# Patient Record
Sex: Male | Born: 1950 | Race: Black or African American | Hispanic: No | Marital: Married | Smoking: Former smoker
Health system: Southern US, Community
[De-identification: ages and names within clinical notes are randomized; demographics above are authoritative.]

## PROBLEM LIST (undated history)

## (undated) DIAGNOSIS — E785 Hyperlipidemia, unspecified: Secondary | ICD-10-CM

## (undated) DIAGNOSIS — I639 Cerebral infarction, unspecified: Secondary | ICD-10-CM

## (undated) DIAGNOSIS — I10 Essential (primary) hypertension: Secondary | ICD-10-CM

## (undated) DIAGNOSIS — C61 Malignant neoplasm of prostate: Secondary | ICD-10-CM

## (undated) DIAGNOSIS — I4891 Unspecified atrial fibrillation: Secondary | ICD-10-CM

## (undated) DIAGNOSIS — Z7902 Long term (current) use of antithrombotics/antiplatelets: Secondary | ICD-10-CM

## (undated) DIAGNOSIS — D649 Anemia, unspecified: Secondary | ICD-10-CM

## (undated) DIAGNOSIS — E119 Type 2 diabetes mellitus without complications: Secondary | ICD-10-CM

## (undated) DIAGNOSIS — M0579 Rheumatoid arthritis with rheumatoid factor of multiple sites without organ or systems involvement: Secondary | ICD-10-CM

## (undated) DIAGNOSIS — Z79899 Other long term (current) drug therapy: Secondary | ICD-10-CM

## (undated) DIAGNOSIS — I700 Atherosclerosis of aorta: Secondary | ICD-10-CM

## (undated) DIAGNOSIS — Z7952 Long term (current) use of systemic steroids: Secondary | ICD-10-CM

## (undated) DIAGNOSIS — R7303 Prediabetes: Secondary | ICD-10-CM

## (undated) DIAGNOSIS — D709 Neutropenia, unspecified: Secondary | ICD-10-CM

## (undated) HISTORY — DX: Prediabetes: R73.03

## (undated) HISTORY — DX: Neutropenia, unspecified: D70.9

## (undated) HISTORY — DX: Anemia, unspecified: D64.9

## (undated) HISTORY — DX: Rheumatoid arthritis with rheumatoid factor of multiple sites without organ or systems involvement: M05.79

## (undated) HISTORY — DX: Malignant neoplasm of prostate: C61

## (undated) HISTORY — DX: Other long term (current) drug therapy: Z79.899

## (undated) HISTORY — DX: Essential (primary) hypertension: I10

---

## 2005-01-17 ENCOUNTER — Emergency Department (HOSPITAL_COMMUNITY): Admission: EM | Admit: 2005-01-17 | Discharge: 2005-01-18 | Payer: Self-pay | Admitting: *Deleted

## 2005-01-22 ENCOUNTER — Emergency Department (HOSPITAL_COMMUNITY): Admission: EM | Admit: 2005-01-22 | Discharge: 2005-01-22 | Payer: Self-pay | Admitting: Emergency Medicine

## 2006-02-24 ENCOUNTER — Encounter: Admission: RE | Admit: 2006-02-24 | Discharge: 2006-02-24 | Payer: Self-pay | Admitting: Rheumatology

## 2012-04-23 ENCOUNTER — Emergency Department: Payer: Self-pay | Admitting: Emergency Medicine

## 2014-06-01 ENCOUNTER — Ambulatory Visit: Payer: Self-pay | Admitting: Rheumatology

## 2014-10-08 ENCOUNTER — Telehealth: Payer: Self-pay | Admitting: Hematology

## 2014-10-08 NOTE — Telephone Encounter (Signed)
Lt mess regarding appt 11/02/14 at 10:30am w/Feng Dx: elevated Immunoglobulins Referring Dr. Estanislado Pandy

## 2014-10-09 ENCOUNTER — Telehealth: Payer: Self-pay | Admitting: Hematology

## 2014-10-09 NOTE — Telephone Encounter (Signed)
PT REQUESTED TO GO TO [place] TO BE CLOSE TO HOME

## 2014-11-02 ENCOUNTER — Ambulatory Visit: Payer: Self-pay | Admitting: Hematology

## 2014-11-02 ENCOUNTER — Ambulatory Visit: Payer: Self-pay

## 2015-09-16 ENCOUNTER — Telehealth: Payer: Self-pay | Admitting: Hematology

## 2015-09-16 NOTE — Telephone Encounter (Signed)
New pt referral in referral entry for [facility]. Established pt at [facility]. Closer to home.

## 2016-04-23 LAB — CBC AND DIFFERENTIAL
HEMATOCRIT: 35 % — AB (ref 41–53)
HEMOGLOBIN: 11.5 g/dL — AB (ref 13.5–17.5)
PLATELETS: 238 10*3/uL (ref 150–399)
WBC: 4 10^3/mL

## 2016-04-23 LAB — HEPATIC FUNCTION PANEL
ALK PHOS: 73 U/L (ref 25–125)
ALT: 39 U/L (ref 10–40)
AST: 45 U/L — AB (ref 14–40)
BILIRUBIN, TOTAL: 0.6 mg/dL

## 2016-04-23 LAB — BASIC METABOLIC PANEL
BUN: 24 mg/dL — AB (ref 4–21)
Creatinine: 1.4 mg/dL — AB (ref 0.6–1.3)
Glucose: 89 mg/dL
Potassium: 4 mmol/L (ref 3.4–5.3)
SODIUM: 136 mmol/L — AB (ref 137–147)

## 2016-06-01 ENCOUNTER — Other Ambulatory Visit: Payer: Self-pay | Admitting: Rheumatology

## 2016-06-01 ENCOUNTER — Other Ambulatory Visit (INDEPENDENT_AMBULATORY_CARE_PROVIDER_SITE_OTHER): Payer: Self-pay | Admitting: Rheumatology

## 2016-06-01 DIAGNOSIS — Z9225 Personal history of immunosupression therapy: Secondary | ICD-10-CM

## 2016-06-02 DIAGNOSIS — Z79899 Other long term (current) drug therapy: Secondary | ICD-10-CM

## 2016-06-02 DIAGNOSIS — E559 Vitamin D deficiency, unspecified: Secondary | ICD-10-CM | POA: Insufficient documentation

## 2016-06-02 DIAGNOSIS — M0579 Rheumatoid arthritis with rheumatoid factor of multiple sites without organ or systems involvement: Secondary | ICD-10-CM | POA: Insufficient documentation

## 2016-06-02 DIAGNOSIS — I10 Essential (primary) hypertension: Secondary | ICD-10-CM | POA: Insufficient documentation

## 2016-06-02 HISTORY — DX: Essential (primary) hypertension: I10

## 2016-06-02 HISTORY — DX: Other long term (current) drug therapy: Z79.899

## 2016-06-02 HISTORY — DX: Rheumatoid arthritis with rheumatoid factor of multiple sites without organ or systems involvement: M05.79

## 2016-06-02 NOTE — Progress Notes (Deleted)
*  IMAGE* Office Visit Note  Patient: Michael Hinton.             Date of Birth: 1951-01-29           MRN: [id number]             PCP: No primary care provider on file. Referring: No ref. provider found Visit Date: 06/03/2016    Subjective:  No chief complaint on file.   History of Present Illness: Jackie Khawaja. is a 65 y.o.retired  male,on disability with severe, erosive, sero+RA. He came today to get ultrasound examination of bilateral hands to look for any underlying synovitis.   Activities of Daily Living:  Patient reports morning stiffness for *** {minute/hour:19697}.   Patient {ACTIONS;DENIES/REPORTS:21021675::"Denies"} nocturnal pain.  Difficulty dressing/grooming: {ACTIONS;DENIES/REPORTS:21021675::"Denies"} Difficulty climbing stairs: {ACTIONS;DENIES/REPORTS:21021675::"Denies"} Difficulty getting out of chair: {ACTIONS;DENIES/REPORTS:21021675::"Denies"} Difficulty using hands for taps, buttons, cutlery, and/or writing: {ACTIONS;DENIES/REPORTS:21021675::"Denies"}   No Rheumatology ROS completed.   PMFS History:  Patient Active Problem List   Diagnosis Date Noted  . Rheumatoid arthritis involving multiple sites with positive rheumatoid factor ([place]) 06/02/2016  . High risk medication use 06/02/2016  . Hypertension 06/02/2016  . Vitamin D deficiency 06/02/2016    No past medical history on file.  No family history on file. No past surgical history on file. Social History   Social History Narrative  . No narrative on file     Objective: Vital Signs: There were no vitals taken for this visit.   Physical Exam   Musculoskeletal Exam: ***  CDAI Exam: No CDAI exam completed.    Investigation: Findings:  Labs done on 04/23/2016: Comprehensive metabolic panel showed creatinine 1.35, GFR 56, AST 45, CBC showed hemoglobin 11.5, TB Gold indeterminate, vitamin D 20    Imaging: No results found.  Speciality Comments: No specialty comments  available.    Procedures:  No procedures performed Allergies: Review of patient's allergies indicates not on file.   Assessment / Plan: Visit Diagnoses: Rheumatoid arthritis involving multiple sites with positive rheumatoid factor (HCC) - erosive +RF+CCP   High risk medication use  Flexion contractures - BEJ  Essential hypertension  Vitamin D deficiency   No problem-specific Assessment & Plan notes found for this encounter.   Follow-Up Instructions: No Follow-up on file.  Orders: No orders of the defined types were placed in this encounter.  No orders of the defined types were placed in this encounter.

## 2016-06-03 ENCOUNTER — Inpatient Hospital Stay (INDEPENDENT_AMBULATORY_CARE_PROVIDER_SITE_OTHER): Payer: Self-pay

## 2016-06-03 ENCOUNTER — Telehealth: Payer: Self-pay | Admitting: Pharmacist

## 2016-06-03 ENCOUNTER — Ambulatory Visit (INDEPENDENT_AMBULATORY_CARE_PROVIDER_SITE_OTHER): Payer: Medicare Other | Admitting: Rheumatology

## 2016-06-03 ENCOUNTER — Inpatient Hospital Stay (INDEPENDENT_AMBULATORY_CARE_PROVIDER_SITE_OTHER): Payer: Medicare Other

## 2016-06-03 DIAGNOSIS — Z79899 Other long term (current) drug therapy: Secondary | ICD-10-CM

## 2016-06-03 DIAGNOSIS — I10 Essential (primary) hypertension: Secondary | ICD-10-CM

## 2016-06-03 DIAGNOSIS — M0579 Rheumatoid arthritis with rheumatoid factor of multiple sites without organ or systems involvement: Secondary | ICD-10-CM

## 2016-06-03 DIAGNOSIS — M2450 Contracture, unspecified joint: Secondary | ICD-10-CM

## 2016-06-03 DIAGNOSIS — E559 Vitamin D deficiency, unspecified: Secondary | ICD-10-CM

## 2016-06-03 LAB — QUANTIFERON TB GOLD ASSAY (BLOOD)
Mitogen-Nil: 0.12 IU/mL
QUANTIFERON NIL VALUE: 0.01 [IU]/mL
Quantiferon Tb Ag Minus Nil Value: 0.04 IU/mL

## 2016-06-03 NOTE — Progress Notes (Signed)
   Procedure Note  Patient: Michael Hinton.             Date of Birth: 1951-06-12           MRN: [id number]             Visit Date: 06/03/2016  Procedures: Visit Diagnoses: Rheumatoid arthritis involving multiple sites with positive rheumatoid factor ([place]) - erosive +RF+CCP - Plan: [place] Extrem Up Bilat Comp, [place] Extrem Up Bilat Comp  Mr. Berenice Primas was here to get ultrasound examination of bilateral hands for ongoing discomfort in his hands. Per EULAR recommendations ultrasound examination of bilateral hands was performed.  Using 12 MHz transducer, grayscale, power Doppler ultrasound examination was performed. Bilateral second and third and fifth MCP joints and bilateral wrist joints both dorsal and volar aspects were evaluated to look for synovitis or due to synovitis.  Findings: All MCP joints showed joint space narrowing were erosive changes and bilateral second MCP joint, left fifth MCP joint. Synovial thickening was noted in all joints. Mild synovitis was noted and left ulnar styloid area. Right median nerve was 0.12 cm squares and left median nerve was 0.09 cm squares which were within normal limits.  Impression: These findings were consistent with rheumatoid arthritis erosive disease with no aggressive synovitis or tenosynovitis. Median nerves were within normal limits. He will continue current treatment for right now.  No procedures performed

## 2016-06-03 NOTE — Telephone Encounter (Signed)
Patient was in the office today for an ultrasound today.  As he was leaving the office, he asked for a refill of Enbrel.  Reviewed patient's chart.  Patient gets his Enbrel from the [employer].  Patient had standing labs in September and noted that his last TB Gold was indeterminate at that time.  Patient had repeat TB Gold on 06/01/16 which should result by the end of the week according to Rito Ehrlich lab tech.  Discussed with Dr. Estanislado Pandy, and will plan to refill Enbrel once TB Gold results.  Called patient to update him on this.    Elisabeth Most, Pharm.D., BCPS Clinical Pharmacist Pager: [phone] Phone: [phone] 06/03/2016 5:19 PM

## 2016-06-04 ENCOUNTER — Other Ambulatory Visit: Payer: Self-pay | Admitting: Internal Medicine

## 2016-06-04 ENCOUNTER — Telehealth: Payer: Self-pay | Admitting: Radiology

## 2016-06-04 DIAGNOSIS — Z9225 Personal history of immunosupression therapy: Secondary | ICD-10-CM

## 2016-06-04 NOTE — Telephone Encounter (Signed)
I have mailed him the order for the PPD> I have left message for him

## 2016-06-04 NOTE — Telephone Encounter (Signed)
This is the 2nd indeterm. Test he will need a TB skin test I have called him to advise / sent him the order .  Left message for him to call me back   Will you look at this with me please if you can ?

## 2016-06-04 NOTE — Telephone Encounter (Signed)
Discussed this with EPIC team.  It was advised to send an External Ambulatory Referral to Public Health and in the comments add that TB Skin test is needed.  This will print the referral which can be given to patient.

## 2016-06-04 NOTE — Telephone Encounter (Signed)
-----   Message from Bo Merino, MD sent at 06/03/2016  4:31 PM EDT ----- Repeat TB Gold

## 2016-06-10 NOTE — Progress Notes (Signed)
Have gotten indeterm. Results, PPD ordered as reflex to second indeterm.

## 2016-06-18 ENCOUNTER — Telehealth: Payer: Self-pay | Admitting: Pharmacist

## 2016-06-18 NOTE — Telephone Encounter (Signed)
I called patient today to follow up on whether he had a TB Skin test performed.  [name] reports he did get our letter in the mail about getting a TB Skin test, but he has not done it yet.  Advised patient that he can call his PCP to see if they can do the skin test, or he can have it done at the health department.  Patient voiced understanding and reports he will go get that done ASAP.  Advised patient to have results sent to [place] once he completes the TB Skin test and we can send in Enbrel as soon as we have those results.    Elisabeth Most, Pharm.D., BCPS Clinical Pharmacist Pager: [phone] Phone: [phone] 06/18/2016 12:45 PM

## 2016-06-19 ENCOUNTER — Telehealth: Payer: Self-pay | Admitting: Rheumatology

## 2016-06-19 DIAGNOSIS — Z9225 Personal history of immunosupression therapy: Secondary | ICD-10-CM

## 2016-06-19 DIAGNOSIS — Z111 Encounter for screening for respiratory tuberculosis: Secondary | ICD-10-CM

## 2016-06-19 NOTE — Telephone Encounter (Signed)
Patient's wife Babs Sciara (she is on HIPAA) has questions about lab work.

## 2016-06-19 NOTE — Telephone Encounter (Signed)
Spoke to patients wife/ faxed labs /order for skin test and last office visit to his PCP at [facility]

## 2016-06-22 ENCOUNTER — Telehealth: Payer: Self-pay | Admitting: Rheumatology

## 2016-06-22 NOTE — Telephone Encounter (Signed)
I have called him to advise TB skin test due since he has had 2 indeterm. Tests he will go tomorrow to [facility], they did get the orders.

## 2016-06-22 NOTE — Telephone Encounter (Signed)
Patient would like to speak to Amy about lab work. He would like to know if he needs additional labs done, patient states he had labs done 2 weeks ago.

## 2016-06-22 NOTE — Telephone Encounter (Signed)
I have discussed with his wife, on Friday, he needs TB skin test done. Will call him also to advise.

## 2016-07-09 ENCOUNTER — Telehealth: Payer: Self-pay | Admitting: Pharmacist

## 2016-07-09 NOTE — Telephone Encounter (Signed)
Prescription was signed by Dr. Estanislado Pandy and faxed to [facility].  Will upload prescription into media tab.    Elisabeth Most, Pharm.D., BCPS Clinical Pharmacist Pager: [phone] Phone: [phone] 07/09/2016 3:54 PM

## 2016-07-09 NOTE — Telephone Encounter (Signed)
Ok to refill Enbrel

## 2016-07-09 NOTE — Telephone Encounter (Signed)
Patient requested a refill of Enbrel while in our office on 06/03/16.  We have been waiting for results of his TB skin test since he had two indeterminate TB Gold tests.  Called patient to follow up today as we still have not received his TB skin test.  Patient confirmed he had the TB skin test performed at [facility] and reports the result was normal.  Patient provided me with the clinic's phone number in order to get a copy of the results.    [place] [phone]) who faxed over the results of the TB skin test.  I confirmed that the TB skin test was negative.  Will upload into his chart.   Last visit: 06/03/16 Next visit: 08/25/16 Labs:  CMP: Cr 1.35, GFR 65, AST 45 (04/23/16) CBC: Hgb 11.5, Hct 34.5,  (04/23/16) TB skin test negative (06/26/16)  Okay to refill his Enbrel?

## 2016-08-07 ENCOUNTER — Other Ambulatory Visit: Payer: Self-pay | Admitting: Rheumatology

## 2016-08-07 NOTE — Telephone Encounter (Signed)
Patient needs a refill of Enbrel; states he gets this through the [employer]

## 2016-08-07 NOTE — Telephone Encounter (Signed)
Left message for patient to advise he is due for labs.

## 2016-08-07 NOTE — Telephone Encounter (Signed)
Patient will have lab work faxed over.

## 2016-08-21 ENCOUNTER — Encounter (INDEPENDENT_AMBULATORY_CARE_PROVIDER_SITE_OTHER): Payer: Self-pay

## 2016-08-23 DIAGNOSIS — M24561 Contracture, right knee: Secondary | ICD-10-CM | POA: Insufficient documentation

## 2016-08-23 DIAGNOSIS — M24562 Contracture, left knee: Secondary | ICD-10-CM

## 2016-08-23 DIAGNOSIS — M24529 Contracture, unspecified elbow: Secondary | ICD-10-CM | POA: Insufficient documentation

## 2016-08-23 NOTE — Progress Notes (Signed)
Office Visit Note  Patient: Michael Hinton.             Date of Birth: 01/15/1951           MRN: [id number]             PCP: Delfina Redwood, PA-C Referring: No ref. provider found Visit Date: 08/25/2016 Occupation: @GUAROCC @    Subjective:  Retired   History of Present Illness: Michael Hinton. is a 66 y.o. male with history of sero positive rheumatoid arthritis. According to patient he has not had much joint swelling. The Enbrel has been working well for him.  Activities of Daily Living:  Patient reports morning stiffness for0 minute.   Patient Denies nocturnal pain.  Difficulty dressing/grooming: Denies Difficulty climbing stairs: Denies Difficulty getting out of chair: Denies Difficulty using hands for taps, buttons, cutlery, and/or writing: Denies   Review of Systems  Constitutional: Negative for fatigue, night sweats and weakness ( ).  HENT: Negative for mouth sores, mouth dryness and nose dryness.   Eyes: Negative for redness and dryness.  Respiratory: Negative for shortness of breath and difficulty breathing.   Cardiovascular: Negative for chest pain, palpitations, hypertension, irregular heartbeat and swelling in legs/feet.  Gastrointestinal: Negative for constipation and diarrhea.  Endocrine: Negative for increased urination.  Musculoskeletal: Negative for arthralgias, joint pain, joint swelling, myalgias, muscle weakness, morning stiffness, muscle tenderness and myalgias.  Skin: Negative for color change, rash, hair loss, nodules/bumps, skin tightness, ulcers and sensitivity to sunlight.  Allergic/Immunologic: Negative for susceptible to infections.  Neurological: Negative for dizziness, fainting, memory loss and night sweats.  Hematological: Negative for swollen glands.  Psychiatric/Behavioral: Negative for depressed mood and sleep disturbance. The patient is not nervous/anxious.     PMFS History:  Patient Active Problem List   Diagnosis Date Noted  .  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 Rheumatoid arthritis involving multiple sites with positive rheumatoid factor ([place]) 06/02/2016  . High risk medication use 06/02/2016  . Hypertension 06/02/2016  . Vitamin D deficiency 06/02/2016    Past Medical History:  Diagnosis Date  . High risk medication use 06/02/2016  . Hypertension 06/02/2016  . Rheumatoid arthritis involving multiple sites with positive rheumatoid factor ([place]) 06/02/2016    No family history on file. No past surgical history on file. Social History   Social History Narrative  . No narrative on file     Objective: Vital Hinton: BP 130/70   Pulse 72   Resp 16   Ht 5\' 11"  (1.803 m)   Wt 225 lb (102.1 kg)   BMI 31.38 kg/m    Physical Exam  Constitutional: He is oriented to person, place, and time. He appears well-developed and well-nourished.  HENT:  Head: Normocephalic and atraumatic.  Eyes: Conjunctivae and EOM are normal. Pupils are equal, round, and reactive to light.  Neck: Normal range of motion. Neck supple.  Cardiovascular: Normal rate, regular rhythm and normal heart sounds.   Pulmonary/Chest: Effort normal and breath sounds normal.  Abdominal: Soft. Bowel sounds are normal.  Neurological: He is alert and oriented to person, place, and time.  Skin: Skin is warm and dry. Capillary refill takes less than 2 seconds.  Psychiatric: He has a normal mood and affect. His behavior is normal.  Nursing note and vitals reviewed.    Musculoskeletal Exam: C-spine and thoracic lumbar spine good range of motion. Bilateral shoulder joints good range of motion. Right elbow joint 20  to be contracture in left elbow joint 15 contracture. He has limited range of motion of bilateral wrist joints incomplete fist formation bilaterally. He has thickening of bilateral MCP joints with ulnar deviation. Hip joints are good range of motion, right knee joint limited extension. Ankle joints good  range of motion some MTP thickening but no synovitis.  CDAI Exam: CDAI Homunculus Exam:   Joint Counts:  CDAI Tender Joint count: 0 CDAI Swollen Joint count: 0  Global Assessments:  Patient Global Assessment: 1 Provider Global Assessment: 1  CDAI Calculated Score: 2    Investigation: Findings:  October 2016 TV: Negative, October 2015 chest x-ray normal, hepatitis panel negative 06/08/2016 WBC 3.0 hemoglobin 10.5 platelets 276, CMP normal, PPD 06/26/2016 negative    Imaging: Xr Foot 2 Views Left  Result Date: 08/25/2016 Narrowing of all MTP joints with erosive changes in several of the MTP joints. No PIP/DIP changes were noted there was narrowing of metatarsal tarsal and intertarsal joints with almost fusion of all intertarsal tarsal joint space. A small calcaneal spur was noted. Impression: Severe erosive inflammatory arthritis consistent with rheumatoid arthritis  Xr Foot 2 Views Right  Result Date: 08/25/2016 All MTP joint subluxation narrowing and erosive changes more aggressive than the left foot PIP/DIP narrowing but no erosive changes were noted. There was some metatarsal tarsal joint narrowing intertarsal joints almost fused a small calcaneal spur was noted. Impression: These findings are consistent with severe erosive rheumatoid arthritis.  Xr Hand 2 View Left  Result Date: 08/25/2016 Left first second third fourth MCP severe narrowing with erosive changes. Left fourth MCP ankle pulses. Severe narrowing of left third PIP joint. All intercarpal joints are almost fused. Severe radiocarpal radioulnar joint narrowing with erosive changes. Impression: Severe arthritis with erosive changes consistent with rheumatoid arthritis  Xr Hand 2 View Right  Result Date: 08/25/2016 Right first second third fourth MCP narrowing erosive changes in her right second and first MCP. Narrowing of all PIP joints. 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    Procedures:  No procedures performed Allergies: Methotrexate derivatives   Assessment / Plan:     Visit Diagnoses: Rheumatoid arthritis involving multiple sites with positive rheumatoid factor (HCC) - Positive RF, positive CCP, severe erosive disease with multiple contractures. He has no synovitis on examination he appears to be tolerating Enbrel well . He is very pleased with the results.  High risk medication use - Enbrel 50 mg subcutaneously every 2 weeks. His labs from October 2017 showed mild neutropenia and anemia otherwise unremarkable. His PPD: 06/26/2016 was also negative. I will obtain labs today and then every 3 months to monitor for drug toxicity. We will be applying for his Enbrel assistance program today and was given 2 samples of Enbrel.  Contracture of elbow - Bilateral: Unchanged with no synovitis  Contractures of both knees: He has some limited extension which causes limping. I'll obtain x-rays today to evaluate this further.  Essential hypertension    Orders: Orders Placed This Encounter  Procedures  . XR  Hand 2 View Right  . XR Hand 2 View Left  . XR KNEE 3 VIEW RIGHT  . XR Foot 2 Views Right  . XR Foot 2 Views Left  . CBC with Differential/Platelet  . COMPLETE METABOLIC PANEL WITH GFR  . CBC with Differential/Platelet  . COMPLETE METABOLIC PANEL WITH GFR   Meds ordered this encounter    Medications  . etanercept (ENBREL SURECLICK) 50 MG/ML injection    Sig: Inject 0.98 mLs (50 mg total) into the skin once a week.    Dispense:  1.96 mL    Refill:  0    Face-to-face time spent with patient was 30 minutes. 50% of time was spent in counseling and coordination of care.  Follow-Up Instructions: Return in about 5 months (around 01/23/2017) for Rheumatoid arthritis.   Bo Merino, MD  Note - This record has been created using Editor, commissioning.  Chart creation errors have been sought, but may not always  have been located. Such creation errors do not reflect on  the standard of medical care.

## 2016-08-25 ENCOUNTER — Ambulatory Visit (INDEPENDENT_AMBULATORY_CARE_PROVIDER_SITE_OTHER): Payer: Medicare Other

## 2016-08-25 ENCOUNTER — Ambulatory Visit (INDEPENDENT_AMBULATORY_CARE_PROVIDER_SITE_OTHER): Payer: Self-pay

## 2016-08-25 ENCOUNTER — Encounter: Payer: Self-pay | Admitting: Rheumatology

## 2016-08-25 ENCOUNTER — Ambulatory Visit (INDEPENDENT_AMBULATORY_CARE_PROVIDER_SITE_OTHER): Payer: Medicare Other | Admitting: Rheumatology

## 2016-08-25 VITALS — BP 130/70 | HR 72 | Resp 16 | Ht 71.0 in | Wt 225.0 lb

## 2016-08-25 DIAGNOSIS — M0579 Rheumatoid arthritis with rheumatoid factor of multiple sites without organ or systems involvement: Secondary | ICD-10-CM

## 2016-08-25 DIAGNOSIS — M24562 Contracture, left knee: Secondary | ICD-10-CM | POA: Diagnosis not present

## 2016-08-25 DIAGNOSIS — M24561 Contracture, right knee: Secondary | ICD-10-CM | POA: Diagnosis not present

## 2016-08-25 DIAGNOSIS — M24529 Contracture, unspecified elbow: Secondary | ICD-10-CM

## 2016-08-25 DIAGNOSIS — Z79899 Other long term (current) drug therapy: Secondary | ICD-10-CM

## 2016-08-25 DIAGNOSIS — I10 Essential (primary) hypertension: Secondary | ICD-10-CM

## 2016-08-25 LAB — CBC WITH DIFFERENTIAL/PLATELET
Basophils Absolute: 33 cells/uL (ref 0–200)
Basophils Relative: 1 %
EOS PCT: 6 %
Eosinophils Absolute: 198 cells/uL (ref 15–500)
HCT: 38 % — ABNORMAL LOW (ref 38.5–50.0)
Hemoglobin: 12.2 g/dL — ABNORMAL LOW (ref 13.2–17.1)
LYMPHS PCT: 43 %
Lymphs Abs: 1419 cells/uL (ref 850–3900)
MCH: 26 pg — AB (ref 27.0–33.0)
MCHC: 32.1 g/dL (ref 32.0–36.0)
MCV: 80.9 fL (ref 80.0–100.0)
MPV: 10.1 fL (ref 7.5–12.5)
Monocytes Absolute: 429 cells/uL (ref 200–950)
Monocytes Relative: 13 %
NEUTROS PCT: 37 %
Neutro Abs: 1221 cells/uL — ABNORMAL LOW (ref 1500–7800)
Platelets: 182 10*3/uL (ref 140–400)
RBC: 4.7 MIL/uL (ref 4.20–5.80)
RDW: 16.1 % — AB (ref 11.0–15.0)
WBC: 3.3 10*3/uL — AB (ref 3.8–10.8)

## 2016-08-25 LAB — COMPLETE METABOLIC PANEL WITH GFR
ALT: 18 U/L (ref 9–46)
AST: 24 U/L (ref 10–35)
Albumin: 3.7 g/dL (ref 3.6–5.1)
Alkaline Phosphatase: 75 U/L (ref 40–115)
BUN: 19 mg/dL (ref 7–25)
CALCIUM: 9.2 mg/dL (ref 8.6–10.3)
CHLORIDE: 107 mmol/L (ref 98–110)
CO2: 24 mmol/L (ref 20–31)
Creat: 1.05 mg/dL (ref 0.70–1.25)
GFR, EST NON AFRICAN AMERICAN: 75 mL/min (ref >=60)
GFR, Est African American: 87 mL/min (ref >=60)
Glucose, Bld: 109 mg/dL — ABNORMAL HIGH (ref 65–99)
POTASSIUM: 4.2 mmol/L (ref 3.5–5.3)
SODIUM: 138 mmol/L (ref 135–146)
Total Bilirubin: 0.6 mg/dL (ref 0.2–1.2)
Total Protein: 7.5 g/dL (ref 6.1–8.1)

## 2016-08-25 MED ORDER — ETANERCEPT 50 MG/ML ~~LOC~~ SOAJ: 50.0000 mg | SUBCUTANEOUS | 0 refills | Status: DC

## 2016-08-25 NOTE — Progress Notes (Signed)
Rheumatology Medication Review by a Pharmacist Does the patient feel that his/her medications are working for him/her?  Yes Has the patient been experiencing any side effects to the medications prescribed?  No Does the patient have any problems obtaining medications?  Yes - patient is out of Enbrel and has not been re-enrolled in the patient assistance program.    Issues to address at subsequent visits: Enrollment in [place] Patient Assistance Program   Pharmacist comments:  Michael Hinton is a 66 yo M who presents for follow up of his rheumatoid arthritis.  He is currently taking Enbrel 50 mg every other week.  Patient had most recent standing labs on 06/08/16 at which time CMP was normal, CBC showed WBC of 3, Hgb 10.9, Hct 33.6.  Patient will be due for standing labs again this month.  His most recent TB test was a PPD on 06/26/16 which was negative.  He will be due for TB test again in November 2018.    Patient brought application for the [employer] patient assistance program for Enbrel.  Provider portion was signed by Dr. Estanislado Pandy and form was faxed.  Patient reports he just used his last Enbrel pen and is now out of his medication.  Discussed with Dr. Estanislado Pandy.  Okay to provide patient with samples while waiting on his application to process.  Patient was provided with 2 Enbrel pens.  Will follow up on status of patient's application in 1 week.  Patient denies any further questions or concerns regarding his medications at this time.     Elisabeth Most, Pharm.D., BCPS, CPP Clinical Pharmacist Pager: [phone] Phone: [phone] 08/25/2016 9:28 AM

## 2016-08-25 NOTE — Patient Instructions (Signed)
Standing Labs We placed an order today for your standing lab work.    Please come back and get your standing labs in April and every 3 months  We have open lab Monday through Friday from 8:30-11:30 AM and 1:30-4 PM at the office of Dr. Brayln Duque/Naitik Panwala, PA.   The office is located at [address], [address], [place], [place] [address] No appointment is necessary.   Labs are drawn by [facility].  You may receive a bill from [facility] for your lab work.    

## 2016-08-27 NOTE — Progress Notes (Signed)
WBC low. Repeat CBC in 1 month

## 2016-08-28 NOTE — Progress Notes (Signed)
We can recheck in 1 month.

## 2016-08-31 ENCOUNTER — Telehealth: Payer: Self-pay | Admitting: Rheumatology

## 2016-08-31 ENCOUNTER — Telehealth: Payer: Self-pay | Admitting: Pharmacist

## 2016-08-31 NOTE — Telephone Encounter (Signed)
Patient called to speak to Dr. Koleen Nimrod. Please call patient.

## 2016-08-31 NOTE — Telephone Encounter (Signed)
Received fax from [employer] stating patient was denied because patient's insurance plan indicated that they have insurance coverage for the medication requested.  Reviewed patient's chart and noted that he does not have prescription drug insurance.  He only has Medicare A & B.  I called patient to discuss.  Patient confirmed he only has original medicare and does not have prescription insurance.  I informed patient we will call [employer] to discuss and will update him once we know more.    While on the phone patient asked me to send his most recent labs to his primary care provider.  Labs from 08/25/16 were faxed to patient's PCP.    [place]asta, will you call [employer] to see why patient did not qualify?  Thank you.

## 2016-08-31 NOTE — Telephone Encounter (Addendum)
Spoke with Aaron Edelman, a representative from [employer] regarding the denial application for Michael Hinton. He states that the patient was approved last year as a once per lifetime pending PartD coverage. According to him, the patient should have applied for partD coverage for 2018 and he would have still been able to use the foundation for support. If he decides to enroll in a partD plane for 2019, he should also re-apply to [place]. He suggested calling Enrel support and/or Amgen Assist for further assistance for 2018.  Enbrel Support: [place]: [phone]  Spoke with a representative from both organizations.  Shley from Enbrel support states that they were not able to assist patient because he did not have RX insurance.   Enid Derry from [employer] that they only handle claims dealing with oncology packages.

## 2016-08-31 NOTE — Telephone Encounter (Signed)
Called patient.  He said that the Enbrel Medicare Solutions Team was unable to provide any assistance since he does not have medicare part D.    Dr. Estanislado Pandy, patient is unable to get assistance for Enbrel this year until he signs up for medicare part D prescription insurance.  He is not eligible for sign up for prescription insurance until October of 2018.  He is currently taking Enbrel every other week.  He has enough medication for this month and part of next month.  Please advise, do you want patient to schedule visit to discuss therapy?  We can try to provide him with samples, but we may not be able to guarantee samples to last him until he can get insurance.

## 2016-08-31 NOTE — Telephone Encounter (Signed)
I received a call from patient who reports he spoke to the local [place] Program who told him he could not enroll in [place] part D insurance until October.  I advised patient to reach out to the Enbrel Medicare Solutions Team to see if they had any further advise.  I provided him with their phone number [phone], option 6).  Patient reports he will try to call them and will let us know what he finds out.   Elisabeth Most, Pharm.D., BCPS, CPP Clinical Pharmacist Pager: [phone] Phone: [phone] 08/31/2016 12:24 PM

## 2016-08-31 NOTE — Telephone Encounter (Signed)
Called Mr. Berenice Primas to update him.  Advised him that to be considered for [employer] patient assistance program, he will need to apply for medicare part D insurance.  I provided patient with information on the [place] program [facility]).  Gave him information on the local [facility] office at [facility] [phone] [address] [place] [address], [phone]).  Advised him to call this office to find out more about enrolling in a part D plan.  Informed him we can reapply for [employer] Safety Net once he has applied for part D insurance.  Asked patient to call me once he knows more information about applying for medicare part D.  Patient voiced understanding.  He confirms he has his medication at this time.     Elisabeth Most, Pharm.D., BCPS, CPP Clinical Pharmacist Pager: [phone] Phone: [phone] 08/31/2016 11:17 AM

## 2016-08-31 NOTE — Telephone Encounter (Signed)
Ok to give him samples till the fu visit.

## 2016-09-01 ENCOUNTER — Encounter: Payer: Self-pay | Admitting: Rheumatology

## 2016-09-01 NOTE — Progress Notes (Signed)
   I review document written by Michael Hinton, Hinton of Michael Hinton,  Michael Hinton, [place]. The date of birth for the Michael Hinton that we have in his chart is 1951-05-08. Michael Hinton were done 07/18/1948.  I was able to reconcile that this paperwork with the wrong date of birth is consistent with this particular Michael Hinton with the date of birth of 11-21-1950.   The letter is dated 06/03/2016 Michael Hinton raised the question of a rash Michael Hinton was having and wondered off it was from arthritis. The Michael Hinton actually was seen on 06/03/2016 and an ultrasound was done on that date for bilateral hands. The findings of the ultrasound included erosive changes from RA.  The second point [place] raise on the letter was that he was having swelling on the left ankle and she described it as "pretty bad". Michael Hinton was recently seen on 08/25/2016 by Dr. Estanislado Pandy. On that visit it was noted that Michael swelling to his joints were much improved and he was happy with the way Enbrel was working for him. Unfortunately he is not able to get Enbrel through the Michael Hinton assistance program yet and we are working with Enbrel Michael Hinton assistance program so that he can get Enbrel through them. Until that is approved, we are trying to give him samples. Michael Hinton understands and is agreeable.  On 0000000 letter Solmon Ice noted that Michael urine was dark on Monday and it was looking orange and maybe there is protein in the urine. She wanted to know if he needed a nephrology referral since his creatinine was high on his recent labs. Michael creatinine has improved and his most recent labs. He is GFR is also improved to 87. Results for DEMONTEZ, DEROUSSE (MRN [id number]) as of 09/01/2016 16:15  Ref. Range 04/23/2016 00:00 08/25/2016 08:33  Creatinine Latest Ref Range: 0.70 - 1.25 mg/dL 1.4 (A) 1.05  With these 2 labs doing better, he does not need a nephrology referral at this time.  He has follow-up appointment on  01/26/2017. This is fine based on his most recent visit to our office.  No further intervention needs to be done for this Michael Hinton based on the letter dated 0000000 written by Michael Hinton. Reviewing his chart shows that all of these issues have been addressed in one way or another through physical exam, lab work, making Enbrel available for the Michael Hinton, etc.  Mr. Carlyon Shadow, [place]

## 2016-09-01 NOTE — Telephone Encounter (Signed)
Called patient and advised that Dr. Estanislado Pandy has agreed to try to provide him with samples until his follow up if we are able.  Patient voiced understanding and reports he has two Enbrel pens at this time and does not need a sample right now.  Patient will call us when he is on his last pen and request a sample.   Patient asked about his lab work.  I reviewed with him that his WBC was low on 08/25/16 and Dr. Estanislado Pandy wants him to repeat his CBC in one month.  Patient voiced understanding and reports he will come for repeat CBC in mid-February.     Elisabeth Most, Pharm.D., BCPS, CPP Clinical Pharmacist Pager: [phone] Phone: [phone] 09/01/2016 3:36 PM

## 2016-09-25 ENCOUNTER — Other Ambulatory Visit: Payer: Self-pay | Admitting: *Deleted

## 2016-09-25 DIAGNOSIS — Z79899 Other long term (current) drug therapy: Secondary | ICD-10-CM

## 2016-09-25 LAB — COMPLETE METABOLIC PANEL WITH GFR
ALBUMIN: 3.6 g/dL (ref 3.6–5.1)
ALK PHOS: 69 U/L (ref 40–115)
ALT: 17 U/L (ref 9–46)
AST: 22 U/L (ref 10–35)
BILIRUBIN TOTAL: 0.6 mg/dL (ref 0.2–1.2)
BUN: 14 mg/dL (ref 7–25)
CALCIUM: 9.3 mg/dL (ref 8.6–10.3)
CHLORIDE: 108 mmol/L (ref 98–110)
CO2: 22 mmol/L (ref 20–31)
CREATININE: 1.12 mg/dL (ref 0.70–1.25)
GFR, EST AFRICAN AMERICAN: 80 mL/min (ref >=60)
GFR, Est Non African American: 70 mL/min (ref >=60)
Glucose, Bld: 108 mg/dL — ABNORMAL HIGH (ref 65–99)
Potassium: 4 mmol/L (ref 3.5–5.3)
Sodium: 140 mmol/L (ref 135–146)
Total Protein: 7.6 g/dL (ref 6.1–8.1)

## 2016-09-25 LAB — CBC WITH DIFFERENTIAL/PLATELET
BASOS PCT: 0 %
Basophils Absolute: 0 cells/uL (ref 0–200)
EOS ABS: 132 {cells}/uL (ref 15–500)
Eosinophils Relative: 4 %
HEMATOCRIT: 37.7 % — AB (ref 38.5–50.0)
HEMOGLOBIN: 12.1 g/dL — AB (ref 13.2–17.1)
LYMPHS ABS: 1287 {cells}/uL (ref 850–3900)
LYMPHS PCT: 39 %
MCH: 26.1 pg — ABNORMAL LOW (ref 27.0–33.0)
MCHC: 32.1 g/dL (ref 32.0–36.0)
MCV: 81.3 fL (ref 80.0–100.0)
MONO ABS: 297 {cells}/uL (ref 200–950)
MPV: 9.6 fL (ref 7.5–12.5)
Monocytes Relative: 9 %
NEUTROS PCT: 48 %
Neutro Abs: 1584 cells/uL (ref 1500–7800)
Platelets: 178 10*3/uL (ref 140–400)
RBC: 4.64 MIL/uL (ref 4.20–5.80)
RDW: 15.8 % — AB (ref 11.0–15.0)
WBC: 3.3 10*3/uL — ABNORMAL LOW (ref 3.8–10.8)

## 2016-09-28 ENCOUNTER — Telehealth: Payer: Self-pay | Admitting: Rheumatology

## 2016-09-28 NOTE — Telephone Encounter (Signed)
Patient has questions about enbrel samples he is supposed to receive from the office. Please call patient.

## 2016-09-28 NOTE — Telephone Encounter (Signed)
Patient states he was in the for his lab work on 09/25/16 and forgot to mention needing a sample on Enbrel. Patient would like to know if he can come get a sample of Enbrel .   Last Visit: 08/25/16 Next Visit: 01/26/17 Labs: 09/25/16 Stable

## 2016-09-28 NOTE — Telephone Encounter (Signed)
Left message to advise patient he could come by the office to pick up samples of Enbrel.

## 2016-09-28 NOTE — Telephone Encounter (Signed)
Patient advised he may come to the office and pick up samples.

## 2016-09-28 NOTE — Telephone Encounter (Signed)
Okay to give sample of Enbrel

## 2016-09-28 NOTE — Telephone Encounter (Signed)
Patient called to return someone's call in regards to his Enbrel.  [phone].  Thank you.

## 2016-09-29 ENCOUNTER — Telehealth: Payer: Self-pay | Admitting: Rheumatology

## 2016-09-29 NOTE — Telephone Encounter (Signed)
Medication Samples have been provided to the patient.  Drug name:  Enbrel      Strength: 50 mg       Qty:2 [id number]  Exp.Date: 08/2018  Dosing instructions: Inject 1 pen every other week  The patient has been instructed regarding the correct time, dose, and frequency of taking this medication, including desired effects and most common side effects.   Michael Hinton 9:39 AM 09/29/2016

## 2016-09-29 NOTE — Telephone Encounter (Signed)
Patient is requesting lab work be sent to [facility]. Thanks.

## 2016-09-29 NOTE — Telephone Encounter (Signed)
Results sent to [facility] as per patient request.

## 2016-10-29 ENCOUNTER — Telehealth: Payer: Self-pay | Admitting: Rheumatology

## 2016-10-29 NOTE — Telephone Encounter (Signed)
ok 

## 2016-10-29 NOTE — Telephone Encounter (Signed)
Patient advised he may come by the office to pick up Enbrel samples.

## 2016-10-29 NOTE — Telephone Encounter (Addendum)
Last Visit: 08/25/16 Next Visit: 01/26/17 Labs: 09/25/16 Stable PPD on 06/26/16 which was negative  Okay to give samples of Enbrel?

## 2016-10-29 NOTE — Telephone Encounter (Signed)
Patient was getting Rx with [employer] but can longer give past 1 year.  Will be signed up for Medicare part D in October.  Is asking if we can provide samples. Please call as patient has none.

## 2016-11-02 ENCOUNTER — Telehealth: Payer: Self-pay | Admitting: *Deleted

## 2016-11-02 NOTE — Telephone Encounter (Signed)
Medication Samples have been provided to the patient.  Drug name: Enbrel       Strength: 50 mg        Qty: 2  LOT: [id number]  Exp.Date: 09/2018  Dosing instructions: Inject 1 pen every other week   The patient has been instructed regarding the correct time, dose, and frequency of taking this medication, including desired effects and most common side effects.   Gwenlyn Perking 1:51 PM 11/02/2016

## 2016-12-18 ENCOUNTER — Telehealth: Payer: Self-pay | Admitting: Rheumatology

## 2016-12-18 NOTE — Telephone Encounter (Signed)
OK TO GIVE SAMPLES OF ENBREL

## 2016-12-18 NOTE — Telephone Encounter (Signed)
Last Visit: 08/25/16 Next Visit: 01/26/17 Labs: 09/25/16 Stable PPD on 06/26/16 which was negative  Okay to give samples of Enbrel?

## 2016-12-18 NOTE — Telephone Encounter (Signed)
Patient advised okay to come by and get sample from the office. Patient states he will send his wife to come get them on Monday.  Patient reminded he will be due for labs this month.

## 2016-12-18 NOTE — Telephone Encounter (Signed)
Patient out of Michael Hinton, needs rf. Patient will have wife pick it up, [name]. Please call patient when ready.

## 2016-12-21 ENCOUNTER — Telehealth: Payer: Self-pay | Admitting: *Deleted

## 2016-12-21 NOTE — Telephone Encounter (Signed)
Medication Samples have been provided to the patient.  Drug name: Enbrel       Strength: 50 mg/mL      Qty: 2  LOT: [id number]  Exp.Date: 11/2018  Dosing instructions: Inject one pen every other week  The patient has been instructed regarding the correct time, dose, and frequency of taking this medication, including desired effects and most common side effects.   Gwenlyn Perking 12:21 PM 12/21/2016

## 2016-12-24 ENCOUNTER — Other Ambulatory Visit: Payer: Self-pay

## 2016-12-24 DIAGNOSIS — Z79899 Other long term (current) drug therapy: Secondary | ICD-10-CM

## 2016-12-24 LAB — CBC WITH DIFFERENTIAL/PLATELET
Basophils Absolute: 35 cells/uL (ref 0–200)
Basophils Relative: 1 %
EOS ABS: 210 {cells}/uL (ref 15–500)
Eosinophils Relative: 6 %
HEMATOCRIT: 36.6 % — AB (ref 38.5–50.0)
Hemoglobin: 11.8 g/dL — ABNORMAL LOW (ref 13.2–17.1)
LYMPHS PCT: 37 %
Lymphs Abs: 1295 cells/uL (ref 850–3900)
MCH: 26.6 pg — ABNORMAL LOW (ref 27.0–33.0)
MCHC: 32.2 g/dL (ref 32.0–36.0)
MCV: 82.4 fL (ref 80.0–100.0)
MONO ABS: 350 {cells}/uL (ref 200–950)
MPV: 9.4 fL (ref 7.5–12.5)
Monocytes Relative: 10 %
NEUTROS PCT: 46 %
Neutro Abs: 1610 cells/uL (ref 1500–7800)
Platelets: 191 10*3/uL (ref 140–400)
RBC: 4.44 MIL/uL (ref 4.20–5.80)
RDW: 15.4 % — AB (ref 11.0–15.0)
WBC: 3.5 10*3/uL — AB (ref 3.8–10.8)

## 2016-12-25 LAB — COMPLETE METABOLIC PANEL WITH GFR
ALK PHOS: 82 U/L (ref 40–115)
ALT: 19 U/L (ref 9–46)
AST: 23 U/L (ref 10–35)
Albumin: 3.5 g/dL — ABNORMAL LOW (ref 3.6–5.1)
BUN: 19 mg/dL (ref 7–25)
CALCIUM: 9.1 mg/dL (ref 8.6–10.3)
CHLORIDE: 108 mmol/L (ref 98–110)
CO2: 22 mmol/L (ref 20–31)
Creat: 1.16 mg/dL (ref 0.70–1.25)
GFR, EST AFRICAN AMERICAN: 77 mL/min (ref >=60)
GFR, Est Non African American: 67 mL/min (ref >=60)
Glucose, Bld: 114 mg/dL — ABNORMAL HIGH (ref 65–99)
POTASSIUM: 4.3 mmol/L (ref 3.5–5.3)
Sodium: 140 mmol/L (ref 135–146)
Total Bilirubin: 0.5 mg/dL (ref 0.2–1.2)
Total Protein: 7.1 g/dL (ref 6.1–8.1)

## 2017-01-22 DIAGNOSIS — D72819 Decreased white blood cell count, unspecified: Secondary | ICD-10-CM | POA: Insufficient documentation

## 2017-01-22 DIAGNOSIS — R768 Other specified abnormal immunological findings in serum: Secondary | ICD-10-CM | POA: Insufficient documentation

## 2017-01-22 DIAGNOSIS — R7689 Other specified abnormal immunological findings in serum: Secondary | ICD-10-CM | POA: Insufficient documentation

## 2017-01-22 NOTE — Progress Notes (Signed)
Office Visit Note  Patient: Michael Hinton.             Date of Birth: 1951/02/20           MRN: [id number]             PCP: Cristy Folks, PA-C Referring: [place]on Folk* Visit Date: 01/26/2017 Occupation: @GUAROCC @    Subjective:  Joint stiffness.   History of Present Illness: Michael Hinton. is a 66 y.o. male with history of sero positive erosive rheumatoid arthritis. He states she's been doing quite well on Enbrel. He's been off methotrexate for a while. He denies any joint pain or joint swelling. She's been tolerating injections well.   Activities of Daily Living:  Patient reports morning stiffness for 0 minute.   Patient Denies nocturnal pain.  Difficulty dressing/grooming: Denies Difficulty climbing stairs: Denies Difficulty getting out of chair: Denies Difficulty using hands for taps, buttons, cutlery, and/or writing: Denies   Review of Systems  Constitutional: Negative for fatigue, night sweats and weakness ( ).  HENT: Negative for mouth sores, mouth dryness and nose dryness.   Eyes: Negative for redness and dryness.  Respiratory: Negative for shortness of breath and difficulty breathing.   Cardiovascular: Negative for chest pain, palpitations, hypertension, irregular heartbeat and swelling in legs/feet.  Gastrointestinal: Negative for constipation and diarrhea.  Endocrine: Negative for increased urination.  Musculoskeletal: Negative for arthralgias, joint pain, joint swelling, myalgias, muscle weakness, morning stiffness, muscle tenderness and myalgias.  Skin: Negative for color change, rash, hair loss, nodules/bumps, skin tightness, ulcers and sensitivity to sunlight.  Allergic/Immunologic: Negative for susceptible to infections.  Neurological: Negative for dizziness, fainting, memory loss and night sweats.  Hematological: Negative for swollen glands.  Psychiatric/Behavioral: Negative for depressed mood and sleep disturbance. The  patient is not nervous/anxious.     PMFS History:  Patient Active Problem List   Diagnosis Date Noted  . ANA positive 01/22/2017  . 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 Rheumatoid arthritis involving multiple sites with positive rheumatoid factor ([place]) 06/02/2016  . High risk medication use 06/02/2016  . Hypertension 06/02/2016  . Vitamin D deficiency 06/02/2016    Past Medical History:  Diagnosis Date  . High risk medication use 06/02/2016  . Hypertension 06/02/2016  . Rheumatoid arthritis involving multiple sites with positive rheumatoid factor ([place]) 06/02/2016    No family history on file. History reviewed. No pertinent surgical history. Social History   Social History Narrative  . No narrative on file     Objective: Vital Signs: BP (!) 142/70   Pulse 80   Resp 14    Physical Exam  Constitutional: He is oriented to person, place, and time. He appears well-developed and well-nourished.  HENT:  Head: Normocephalic and atraumatic.  Eyes: Conjunctivae and EOM are normal. Pupils are equal, round, and reactive to light.  Neck: Normal range of motion. Neck supple.  Cardiovascular: Normal rate and regular rhythm.   Murmur heard. Pulmonary/Chest: Effort normal and breath sounds normal.  Abdominal: Soft. Bowel sounds are normal.  Neurological: He is alert and oriented to person, place, and time.  Skin: Skin is warm and dry. Capillary refill takes less than 2 seconds.  Psychiatric: He has a normal mood and affect. His behavior is normal.  Nursing note and vitals reviewed.    Musculoskeletal Exam: C-spine and thoracic lumbar spine good range of motion. His bilateral shoulder joint  abduction was limited to 120. His contracture in his bilateral elbows. He is very limited range of motion his bilateral wrist joints with no synovitis. He had ulnar deviation bilaterally with fairly good fist formation with decreased  grip strength. No synovitis was noted over MCPs PIPs. Hip joints and knee joints have limited range of motion with no synovitis.  CDAI Exam: CDAI Homunculus Exam:   Joint Counts:  CDAI Tender Joint count: 0 CDAI Swollen Joint count: 0  Global Assessments:  Patient Global Assessment: 1 Provider Global Assessment: 1  CDAI Calculated Score: 2    Investigation: Findings:    Labs from 06/07/2014 show TB Gold is negative.  It was initially indeterminate on the October 23rd labs.   Labs from 06/01/2014  CMP with GFR is normal.  CBC with diff is normal except for mild anemia.  Sed rate elevated at 82.  Hep panel negative.  Uric acid normal at 6.7.  HIV negative.  IgG and IgM are normal.  IgA is elevated at 1350.  SPEP M-spike negative.  Urine is negative.  IFE is negative.  TB Gold is indeterminate.  CCP is positive at 10.5.  Rheumatoid factor positive at 278.  ANA positive with a titer of 1:80 and homogenous pattern.   Chest x-ray done on 06/01/2014 is negative.  February 2016:  His WBC count was 3.8, hemoglobin 11, platelets 292.  Comprehensive metabolic panel showed albumin of 3.2.  Sedimentation rate was 111. Immunoglobulins IgA and IgG were elevated.    Labs from 11/05/2014 show CBC with diff is normal.  CMP is normal except for increased glucose at 117 (nonfasting levels).  Lipid levels are normal.  Vitamin B12 is low at 158 and being addressed by the PCP.  Increased ferritin at 972.  Random urine shows increased microalbumin and increased albumin/creatinine ration, addressed by PCP.    06/2015:  His white cell count was 3.1, hemoglobin 12.8, comprehensive metabolic panel showed glucose of 125.  In October his white cell count was 2.8.  TB Gold was negative.    Labs from September 16, 2015, show WBCs low at 3.7.  Vitamin B12 is 521.  I discussed with the wife that the patient is not getting his labs at the right time and it is important to get the labs every 2 months.  Otherwise, we will  be unable to see the patient for his care.   06/26/2016 negative PPD from PCP office      CBC Latest Ref Rng & Units 12/24/2016 09/25/2016 08/25/2016  WBC 3.8 - 10.8 K/uL 3.5(L) 3.3(L) 3.3(L)  Hemoglobin 13.2 - 17.1 g/dL 11.8(L) 12.1(L) 12.2(L)  Hematocrit 38.5 - 50.0 % 36.6(L) 37.7(L) 38.0(L)  Platelets 140 - 400 K/uL 191 178 182    CMP Latest Ref Rng & Units 12/24/2016 09/25/2016 08/25/2016  Glucose 65 - 99 mg/dL 114(H) 108(H) 109(H)  BUN 7 - 25 mg/dL 19 14 19   Creatinine 0.70 - 1.25 mg/dL 1.16 1.12 1.05  Sodium 135 - 146 mmol/L 140 140 138  Potassium 3.5 - 5.3 mmol/L 4.3 4.0 4.2  Chloride 98 - 110 mmol/L 108 108 107  CO2 20 - 31 mmol/L 22 22 24   Calcium 8.6 - 10.3 mg/dL 9.1 9.3 9.2  Total Protein 6.1 - 8.1 g/dL 7.1 7.6 7.5  Total Bilirubin 0.2 - 1.2 mg/dL 0.5 0.6 0.6  Alkaline Phos 40 - 115 U/L 82 69 75  AST 10 - 35 U/L 23 22 24   ALT 9 - 46 U/L 19  17 18    Imaging: No results found.  Speciality Comments: No specialty comments available.    Procedures:  No procedures performed Allergies: Patient has no known allergies.   Assessment / Plan:     Visit Diagnoses: Rheumatoid arthritis involving multiple sites with positive rheumatoid factor (HCC) - severe erosive disease RF 278, +anti CCP, +ANA1:80 erosive changes hands feet ,h/o knee effusions, right elbow contracture, limited range of motion in his shoulder joints, elbow joints, wrist joints, ulnar deviation, hip joints and knee joints.  High risk medication use - Enbrel 50 mg sq q other week. He is having some insurance issues and we gave him samples of Enbrel today. His labs are stable except for mild neutropenia which is stable. He is doing very well on current regimen will continue the treatment.  Contracture of right elbow  Contractures of both knees  Vitamin D deficiency: He is on supplement.  History of hypertension: His blood pressure was elevated. I've advised him to monitor his blood pressure and follow-up with  PCP.  Low vitamin B12 level    Orders: No orders of the defined types were placed in this encounter.  No orders of the defined types were placed in this encounter.   Face-to-face time spent with patient was 30 minutes. 50% of time was spent in counseling and coordination of care.  Follow-Up Instructions: Return in about 5 months (around 06/28/2017) for Rheumatoid arthritis.   Bo Merino, MD  Note - This record has been created using Editor, commissioning.  Chart creation errors have been sought, but may not always  have been located. Such creation errors do not reflect on  the standard of medical care.

## 2017-01-26 ENCOUNTER — Ambulatory Visit (INDEPENDENT_AMBULATORY_CARE_PROVIDER_SITE_OTHER): Payer: Medicare Other | Admitting: Rheumatology

## 2017-01-26 ENCOUNTER — Encounter: Payer: Self-pay | Admitting: Rheumatology

## 2017-01-26 VITALS — BP 142/70 | HR 80 | Resp 14

## 2017-01-26 DIAGNOSIS — Z79899 Other long term (current) drug therapy: Secondary | ICD-10-CM | POA: Diagnosis not present

## 2017-01-26 DIAGNOSIS — M24521 Contracture, right elbow: Secondary | ICD-10-CM

## 2017-01-26 DIAGNOSIS — E538 Deficiency of other specified B group vitamins: Secondary | ICD-10-CM

## 2017-01-26 DIAGNOSIS — M24562 Contracture, left knee: Secondary | ICD-10-CM

## 2017-01-26 DIAGNOSIS — M0579 Rheumatoid arthritis with rheumatoid factor of multiple sites without organ or systems involvement: Secondary | ICD-10-CM | POA: Diagnosis not present

## 2017-01-26 DIAGNOSIS — D702 Other drug-induced agranulocytosis: Secondary | ICD-10-CM | POA: Diagnosis not present

## 2017-01-26 DIAGNOSIS — E559 Vitamin D deficiency, unspecified: Secondary | ICD-10-CM

## 2017-01-26 DIAGNOSIS — Z8679 Personal history of other diseases of the circulatory system: Secondary | ICD-10-CM

## 2017-01-26 DIAGNOSIS — M24561 Contracture, right knee: Secondary | ICD-10-CM | POA: Diagnosis not present

## 2017-01-26 MED ORDER — ETANERCEPT 50 MG/ML ~~LOC~~ SOAJ: 50.0000 mg | SUBCUTANEOUS | 0 refills | Status: DC

## 2017-01-26 NOTE — Progress Notes (Addendum)
Rheumatology Medication Review by a Pharmacist Does the patient feel that his/her medications are working for him/her?  Yes Has the patient been experiencing any side effects to the medications prescribed?  No Does the patient have any problems obtaining medications?  Yes  Issues to address at subsequent visits: Enrollment in [place]   Pharmacist comments:  [name] is a 66 yo M who presents for follow up of his rheumatoid arthritis.  He is currently taking Enbrel 50 mg every other week.  Patient had most recent standing labs on 12/24/16 which were stable.  Patient will be due for standing labs again in August 2018 and every 3 months.  His most recent TB test was a PPD on 06/26/16 which was negative.  He will be due for TB test again in November 2018.  Patient was denied enrollment in [employer] this year as he does not have medicare part D insurance (see previous telephone notes).  Patient plans to sign up for medicare part D insurance plan as soon as open enrollment begins.  I advised him to let us know as soon as he signs up for a medicare part D plan because we will then be able to resubmit his [employer] application and try to get him re-enrolled in the program.   Will provide patient with two months of Enbrel samples at this time.  These samples will last him until he is due to come back to the office again for standing labs in August 2018.   Medication Samples have been provided to the patient.  Drug name: Enbrel       Strength: 50 mg        Qty: 4  LOT: [id number]  Exp.Date: 04/20  Dosing instructions: Inject under the skin every 14 days.    The patient has been instructed regarding the correct time, dose, and frequency of taking this medication, including desired effects and most common side effects.   Elisabeth Most, Pharm.D., BCPS, CPP Clinical Pharmacist Pager: [phone] Phone: [phone] 01/26/2017 10:23 AM

## 2017-01-26 NOTE — Patient Instructions (Addendum)
Standing Labs We placed an order today for your standing lab work.    Please come back and get your standing labs in August and  Every 3 months  We have open lab Monday through Friday from 8:30-11:30 AM and 1:30-4 PM at the office of Dr. Tresa Moore, PA.   The office is located at [address] [place], [place], [place] [address] No appointment is necessary.   Labs are drawn by [facility].  You may receive a bill from [facility] for your lab work. If you have any questions regarding directions or hours of operation,  please call [phone].    Start out going [place] on [address] toward [address].  0" Then 0.11 miles  Take the 1st left onto [address].  1. If you reach [address] you've gone about 0.1 miles too far 0" Then 0.07 miles  Turn left onto [address].  1. If you are on [address] [place] and reach [address] gone a little too far 0" Then 0.24 miles  Turn right onto [address]  1. [address] is 0.2 miles past [address] 2. If you reach [address] you've gone about 0.1 miles too far 0" Then 0.33 miles  Turn left onto [address].  0" Then 1.52 miles  Turn left onto [address] is just past [address] 2. If you reach [address] gone a little too far 0" Then 0.14 miles   [place], [address], [place], [place], [place] is on the right.

## 2017-04-27 ENCOUNTER — Telehealth: Payer: Self-pay | Admitting: Rheumatology

## 2017-04-27 NOTE — Telephone Encounter (Signed)
Patient left a message stating he is completely out of Enbrel. Patient request a refill.

## 2017-04-27 NOTE — Telephone Encounter (Signed)
Patient advised he is due for labs. Patient advised once he comes in for labs we can provided him with his Enbrel samples.   Last Visit: 01/26/17 Next Visit: 06/16/17 Labs: 12/24/16 stable.  PPD on 06/26/16 Neg  Patient will update labs 04/28/17

## 2017-04-28 ENCOUNTER — Other Ambulatory Visit: Payer: Self-pay

## 2017-04-28 ENCOUNTER — Telehealth: Payer: Self-pay | Admitting: *Deleted

## 2017-04-28 DIAGNOSIS — Z79899 Other long term (current) drug therapy: Secondary | ICD-10-CM

## 2017-04-28 LAB — COMPLETE METABOLIC PANEL WITH GFR
AG RATIO: 1 (calc) (ref 1.0–2.5)
ALBUMIN MSPROF: 3.7 g/dL (ref 3.6–5.1)
ALT: 15 U/L (ref 9–46)
AST: 22 U/L (ref 10–35)
Alkaline phosphatase (APISO): 88 U/L (ref 40–115)
BILIRUBIN TOTAL: 0.6 mg/dL (ref 0.2–1.2)
BUN: 16 mg/dL (ref 7–25)
CHLORIDE: 107 mmol/L (ref 98–110)
CO2: 21 mmol/L (ref 20–32)
Calcium: 9 mg/dL (ref 8.6–10.3)
Creat: 1.12 mg/dL (ref 0.70–1.25)
GFR, EST AFRICAN AMERICAN: 81 mL/min/{1.73_m2} (ref >=60)
GFR, Est Non African American: 70 mL/min/{1.73_m2} (ref >=60)
GLOBULIN: 3.6 g/dL (ref 1.9–3.7)
Glucose, Bld: 140 mg/dL — ABNORMAL HIGH (ref 65–99)
POTASSIUM: 3.9 mmol/L (ref 3.5–5.3)
SODIUM: 138 mmol/L (ref 135–146)
TOTAL PROTEIN: 7.3 g/dL (ref 6.1–8.1)

## 2017-04-28 LAB — CBC WITH DIFFERENTIAL/PLATELET
BASOS PCT: 0.6 %
Basophils Absolute: 21 cells/uL (ref 0–200)
EOS ABS: 130 {cells}/uL (ref 15–500)
Eosinophils Relative: 3.7 %
HCT: 35.9 % — ABNORMAL LOW (ref 38.5–50.0)
HEMOGLOBIN: 12 g/dL — AB (ref 13.2–17.1)
Lymphs Abs: 1190 cells/uL (ref 850–3900)
MCH: 26.6 pg — AB (ref 27.0–33.0)
MCHC: 33.4 g/dL (ref 32.0–36.0)
MCV: 79.6 fL — AB (ref 80.0–100.0)
MPV: 10.8 fL (ref 7.5–12.5)
Monocytes Relative: 10 %
Neutro Abs: 1810 cells/uL (ref 1500–7800)
Neutrophils Relative %: 51.7 %
PLATELETS: 174 10*3/uL (ref 140–400)
RBC: 4.51 10*6/uL (ref 4.20–5.80)
RDW: 13.9 % (ref 11.0–15.0)
TOTAL LYMPHOCYTE: 34 %
WBC: 3.5 10*3/uL — ABNORMAL LOW (ref 3.8–10.8)
WBCMIX: 350 {cells}/uL (ref 200–950)

## 2017-04-28 NOTE — Telephone Encounter (Signed)
Medication Samples have been provided to the patient.  Drug name: Enbrel      Strength: 50 mg        Qty: 2 LOT: [id number]  Exp.Date: 02/2019  Dosing instructions: Inject 1 pen every other week  The patient has been instructed regarding the correct time, dose, and frequency of taking this medication, including desired effects and most common side effects.   Gwenlyn Perking 8:53 AM 04/28/2017

## 2017-04-29 NOTE — Progress Notes (Signed)
Labs are stable.

## 2017-05-07 ENCOUNTER — Telehealth: Payer: Self-pay

## 2017-05-07 NOTE — Telephone Encounter (Signed)
Patient is currently on Enbrel. He was denied from [employer] for 2018 because he was not enrolled in a Medicare Part D plan. Called patient to see what his plans were and to remind him about open enrollment starting in October for 2019. He has been receiving samples for Enbrel from the clinic. When and if he applies for insurance for 2019, we will be able to re-apply to the [facility] for assistance with the Enbrel coverage.   Did not get an answer. Left message for patient to call back.  Jahmeir Geisen, [place], CPhT 9:51 AM

## 2017-06-05 NOTE — Progress Notes (Signed)
Office Visit Note  Patient: Michael Hinton.             Date of Birth: Jun 07, 1951           MRN: [id number]             PCP: Cristy Folks, PA-C Referring: Grafton Folk* Visit Date: 06/16/2017 Occupation: @GUAROCC @    Subjective:  Medication management   History of Present Illness: Macio Kissoon. is a 66 y.o. male with history of some sero positive erosive rheumatoid arthritis.he's been on Enbrel every other week now. He denies any joint swelling or joint inflammation. He denies any morning stiffness. The Enbrel dose was change every other week as he was responding quite well to the medication.  Activities of Daily Living:  Patient reports morning stiffness for  minute.   Patient Denies nocturnal pain.  Difficulty dressing/grooming: Denies Difficulty climbing stairs: Denies Difficulty getting out of chair: Denies Difficulty using hands for taps, buttons, cutlery, and/or writing: Denies   Review of Systems  Constitutional: Negative for fatigue, night sweats and weakness ( ).  HENT: Negative for mouth sores, mouth dryness and nose dryness.   Eyes: Negative for redness and dryness.  Respiratory: Negative for shortness of breath and difficulty breathing.   Cardiovascular: Positive for hypertension. Negative for chest pain, palpitations, irregular heartbeat and swelling in legs/feet.  Gastrointestinal: Negative for constipation and diarrhea.  Endocrine: Negative for increased urination.  Musculoskeletal: Negative for arthralgias, joint pain, joint swelling, myalgias, muscle weakness, morning stiffness, muscle tenderness and myalgias.  Skin: Negative for color change, rash, hair loss, nodules/bumps, skin tightness, ulcers and sensitivity to sunlight.  Allergic/Immunologic: Negative for susceptible to infections.  Neurological: Negative for dizziness, fainting, memory loss and night sweats.  Hematological: Negative for swollen glands.    Psychiatric/Behavioral: Negative for depressed mood and sleep disturbance. The patient is not nervous/anxious.     PMFS History:  Patient Active Problem List   Diagnosis Date Noted  . ANA positive 01/22/2017  . 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 Rheumatoid arthritis involving multiple sites with positive rheumatoid factor ([place]) 06/02/2016  . High risk medication use 06/02/2016  . Hypertension 06/02/2016  . Vitamin D deficiency 06/02/2016    Past Medical History:  Diagnosis Date  . High risk medication use 06/02/2016  . Hypertension 06/02/2016  . Rheumatoid arthritis involving multiple sites with positive rheumatoid factor ([place]) 06/02/2016    Family History  Problem Relation Age of Onset  . Heart disease Mother   . Diabetes Mother   . Kidney disease Father   . Diabetes Daughter    History reviewed. No pertinent surgical history. Social History   Social History Narrative  . Not on file     Objective: Vital Signs: BP 138/76 (BP Location: Left Arm, Patient Position: Sitting, Cuff Size: Normal)   Pulse 83   Resp 14   Ht 5\' 11"  (1.803 m)   Wt 225 lb (102.1 kg)   BMI 31.38 kg/m    Physical Exam  Constitutional: He is oriented to person, place, and time. He appears well-developed and well-nourished.  HENT:  Head: Normocephalic and atraumatic.  Eyes: Conjunctivae and EOM are normal. Pupils are equal, round, and reactive to light.  Neck: Normal range of motion. Neck supple.  Cardiovascular: Normal rate, regular rhythm and normal heart sounds.  Pulmonary/Chest: Effort normal and breath sounds normal.  Abdominal: Soft. Bowel sounds  are normal.  Neurological: He is alert and oriented to person, place, and time.  Skin: Skin is warm and dry. Capillary refill takes less than 2 seconds.  Psychiatric: He has a normal mood and affect. His behavior is normal.  Nursing note and vitals reviewed.     Musculoskeletal Exam: C-spine and thoracic lumbar spine good range of motion. Shoulder joints are good range of motion. He has bilateral elbow joint contractures with no synovitis. He has ulnar deviation bilaterally with synovial thickening but no synovitis was noted. He has limited range of motion of his bilateral hip joints and knee joints. No synovitis was noted.  CDAI Exam: CDAI Homunculus Exam:   Joint Counts:  CDAI Tender Joint count: 0 CDAI Swollen Joint count: 0  Global Assessments:  Patient Global Assessment: 0 Provider Global Assessment: 0    Investigation: No additional findings. CBC Latest Ref Rng & Units 04/28/2017 12/24/2016 09/25/2016  WBC 3.8 - 10.8 Thousand/uL 3.5(L) 3.5(L) 3.3(L)  Hemoglobin 13.2 - 17.1 g/dL 12.0(L) 11.8(L) 12.1(L)  Hematocrit 38.5 - 50.0 % 35.9(L) 36.6(L) 37.7(L)  Platelets 140 - 400 Thousand/uL 174 191 178   CMP Latest Ref Rng & Units 04/28/2017 12/24/2016 09/25/2016  Glucose 65 - 99 mg/dL 140(H) 114(H) 108(H)  BUN 7 - 25 mg/dL 16 19 14   Creatinine 0.70 - 1.25 mg/dL 1.12 1.16 1.12  Sodium 135 - 146 mmol/L 138 140 140  Potassium 3.5 - 5.3 mmol/L 3.9 4.3 4.0  Chloride 98 - 110 mmol/L 107 108 108  CO2 20 - 32 mmol/L 21 22 22   Calcium 8.6 - 10.3 mg/dL 9.0 9.1 9.3  Total Protein 6.1 - 8.1 g/dL 7.3 7.1 7.6  Total Bilirubin 0.2 - 1.2 mg/dL 0.6 0.5 0.6  Alkaline Phos 40 - 115 U/L - 82 69  AST 10 - 35 U/L 22 23 22   ALT 9 - 46 U/L 15 19 17     Imaging: No results found.  Speciality Comments: No specialty comments available.    Procedures:  No procedures performed Allergies: Patient has no known allergies.   Assessment / Plan:     Visit Diagnoses: Rheumatoid arthritis involving multiple sites with positive rheumatoid factor (HCC) - severe erosive disease RF 278, +anti CCP, +ANA1:80. He is doing well on Enbrel. He has no synovitis on examination. He has spaced his Enbrel to every other week. We will continue current regimen for right  now.  High risk medication use - Enbrel 50 mg sq q other week. - Plan: CBC with Differential/Platelet, COMPLETE METABOLIC PANEL WITH GFR, Quantiferon tb gold assay (blood)today and then CBC CMP will be checked every 3 months.he is applying for renewal of his Enbrel patient supplement program. Enbrel samples were given today.  Contractures of both knees: He has limited extension.  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  History of hypertension - his blood pressure is better controlled now. 5 advised to continue to monitor that.  History of vitamin D deficiency - He is on supplement.    Orders: Orders Placed This Encounter  Procedures  . CBC with Differential/Platelet  . COMPLETE METABOLIC PANEL WITH GFR  . Quantiferon tb gold assay (blood)   No orders of the defined types were placed in this encounter.     Follow-Up Instructions: Return in about 5 months (around 11/14/2017) for Rheumatoid arthritis.   Bo Merino, MD  Note - This record has been created using Editor, commissioning.  Chart creation errors have been sought, but may not always  have  been located. Such creation errors do not reflect on  the standard of medical care.

## 2017-06-16 ENCOUNTER — Encounter: Payer: Self-pay | Admitting: Rheumatology

## 2017-06-16 ENCOUNTER — Ambulatory Visit (INDEPENDENT_AMBULATORY_CARE_PROVIDER_SITE_OTHER): Payer: Medicare Other | Admitting: Rheumatology

## 2017-06-16 VITALS — BP 138/76 | HR 83 | Resp 14 | Ht 71.0 in | Wt 225.0 lb

## 2017-06-16 DIAGNOSIS — Z8639 Personal history of other endocrine, nutritional and metabolic disease: Secondary | ICD-10-CM | POA: Diagnosis not present

## 2017-06-16 DIAGNOSIS — M24561 Contracture, right knee: Secondary | ICD-10-CM | POA: Diagnosis not present

## 2017-06-16 DIAGNOSIS — Z79899 Other long term (current) drug therapy: Secondary | ICD-10-CM

## 2017-06-16 DIAGNOSIS — M24521 Contracture, right elbow: Secondary | ICD-10-CM | POA: Diagnosis not present

## 2017-06-16 DIAGNOSIS — Z8679 Personal history of other diseases of the circulatory system: Secondary | ICD-10-CM

## 2017-06-16 DIAGNOSIS — M24562 Contracture, left knee: Secondary | ICD-10-CM | POA: Diagnosis not present

## 2017-06-16 DIAGNOSIS — M0579 Rheumatoid arthritis with rheumatoid factor of multiple sites without organ or systems involvement: Secondary | ICD-10-CM

## 2017-06-16 NOTE — Patient Instructions (Signed)
Standing Labs We placed an order today for your standing lab work.    Please come back and get your standing labs in February and every 3 months  We have open lab Monday through Friday from 8:30-11:30 AM and 1:30-4 PM at the office of Dr. Carola Viramontes.   The office is located at [address], [address], [place], [place] [address] No appointment is necessary.   Labs are drawn by [facility].  You may receive a bill from [facility] for your lab work. If you have any questions regarding directions or hours of operation,  please call [phone].    

## 2017-06-16 NOTE — Addendum Note (Signed)
Addended by: Bertis Ruddy on: 06/16/2017 09:37 AM   Modules accepted: Orders

## 2017-06-18 LAB — COMPLETE METABOLIC PANEL WITH GFR
AG Ratio: 0.9 (calc) — ABNORMAL LOW (ref 1.0–2.5)
ALBUMIN MSPROF: 3.7 g/dL (ref 3.6–5.1)
ALKALINE PHOSPHATASE (APISO): 95 U/L (ref 40–115)
ALT: 16 U/L (ref 9–46)
AST: 21 U/L (ref 10–35)
BUN: 15 mg/dL (ref 7–25)
CO2: 24 mmol/L (ref 20–32)
CREATININE: 1.09 mg/dL (ref 0.70–1.25)
Calcium: 9.1 mg/dL (ref 8.6–10.3)
Chloride: 101 mmol/L (ref 98–110)
GFR, Est African American: 83 mL/min/{1.73_m2} (ref >=60)
GFR, Est Non African American: 72 mL/min/{1.73_m2} (ref >=60)
GLUCOSE: 119 mg/dL — AB (ref 65–99)
Globulin: 4.2 g/dL (calc) — ABNORMAL HIGH (ref 1.9–3.7)
Potassium: 4 mmol/L (ref 3.5–5.3)
Sodium: 134 mmol/L — ABNORMAL LOW (ref 135–146)
Total Bilirubin: 0.8 mg/dL (ref 0.2–1.2)
Total Protein: 7.9 g/dL (ref 6.1–8.1)

## 2017-06-18 LAB — QUANTIFERON TB GOLD ASSAY (BLOOD)
MITOGEN-NIL SO: 1.62 [IU]/mL
QUANTIFERON NIL VALUE: 0.02 [IU]/mL
QUANTIFERON(R)-TB GOLD: NEGATIVE

## 2017-06-18 LAB — CBC WITH DIFFERENTIAL/PLATELET
BASOS PCT: 0.4 %
Basophils Absolute: 20 cells/uL (ref 0–200)
EOS PCT: 2 %
Eosinophils Absolute: 100 cells/uL (ref 15–500)
HCT: 39.4 % (ref 38.5–50.0)
HEMOGLOBIN: 12.8 g/dL — AB (ref 13.2–17.1)
Lymphs Abs: 1030 cells/uL (ref 850–3900)
MCH: 25.8 pg — ABNORMAL LOW (ref 27.0–33.0)
MCHC: 32.5 g/dL (ref 32.0–36.0)
MCV: 79.3 fL — ABNORMAL LOW (ref 80.0–100.0)
MONOS PCT: 10.4 %
MPV: 10.5 fL (ref 7.5–12.5)
NEUTROS ABS: 3330 {cells}/uL (ref 1500–7800)
Neutrophils Relative %: 66.6 %
PLATELETS: 204 10*3/uL (ref 140–400)
RBC: 4.97 10*6/uL (ref 4.20–5.80)
RDW: 14.1 % (ref 11.0–15.0)
TOTAL LYMPHOCYTE: 20.6 %
WBC mixed population: 520 cells/uL (ref 200–950)
WBC: 5 10*3/uL (ref 3.8–10.8)

## 2017-06-30 ENCOUNTER — Ambulatory Visit: Payer: Medicare Other | Admitting: Rheumatology

## 2017-08-13 ENCOUNTER — Telehealth: Payer: Self-pay | Admitting: Rheumatology

## 2017-08-13 NOTE — Telephone Encounter (Signed)
Patient requesting a refill on Enbrel. Patient is enrolled with [employer] now. Patient has been getting samples from [place].

## 2017-08-13 NOTE — Telephone Encounter (Signed)
Okay to give one month sample. Apply for assistance?

## 2017-08-16 NOTE — Telephone Encounter (Signed)
A prior authorization for Enbrel was submitted to pts insurance via cover my meds.  BIN: [id number] PCN: PartD ID: [id number] GP: [id number]  Enbrel is approved without authorization with pts plan however, medication is still very expensive.   Called pt to update. He states that he was once enrolled in the [employer] and would like to re-apply. He plans to come in on Thursday, January 10th to fill out and sign application. He has enough medication to hold him over until the end of the month.   Will update as we proceed.   Cayetano Mikita, [place], CPhT 3:18 PM

## 2017-08-17 MED ORDER — ETANERCEPT 50 MG/ML ~~LOC~~ SOAJ: 50.0000 mg | SUBCUTANEOUS | 0 refills | Status: DC

## 2017-08-17 NOTE — Addendum Note (Signed)
Addended by: Carole Binning on: 08/17/2017 03:13 PM   Modules accepted: Orders

## 2017-08-17 NOTE — Telephone Encounter (Signed)
Prescription has been sent to the pharmacy. 

## 2017-08-17 NOTE — Telephone Encounter (Signed)
Spoke with pt about enrolling him into the PAN foundation. Pt agreed. Application was submitted and approved. He has been awarded $4600 per year *information below*  We will continue to work on the [employer] to use when the [employer] run out. He will need a new Rx sent to [facility].   BIN: [id number] PCN: [id number] GROUP: [id number] ID: [id number]  AUTHORIZED DATES: 05/19/2017 -08/16/2018  Please send new Rx to [facility]. Thanks!  Sagan Maselli, [place], CPhT 2:53 PM

## 2017-08-18 ENCOUNTER — Other Ambulatory Visit: Payer: Self-pay

## 2017-09-01 DIAGNOSIS — Z1211 Encounter for screening for malignant neoplasm of colon: Secondary | ICD-10-CM | POA: Diagnosis not present

## 2017-09-01 DIAGNOSIS — R739 Hyperglycemia, unspecified: Secondary | ICD-10-CM | POA: Diagnosis not present

## 2017-09-01 DIAGNOSIS — I10 Essential (primary) hypertension: Secondary | ICD-10-CM | POA: Diagnosis not present

## 2017-09-01 DIAGNOSIS — M069 Rheumatoid arthritis, unspecified: Secondary | ICD-10-CM | POA: Diagnosis not present

## 2017-09-01 DIAGNOSIS — D519 Vitamin B12 deficiency anemia, unspecified: Secondary | ICD-10-CM | POA: Diagnosis not present

## 2017-09-01 DIAGNOSIS — E785 Hyperlipidemia, unspecified: Secondary | ICD-10-CM | POA: Diagnosis not present

## 2017-11-05 DIAGNOSIS — E559 Vitamin D deficiency, unspecified: Secondary | ICD-10-CM | POA: Diagnosis not present

## 2017-11-05 DIAGNOSIS — D519 Vitamin B12 deficiency anemia, unspecified: Secondary | ICD-10-CM | POA: Diagnosis not present

## 2017-11-18 ENCOUNTER — Telehealth: Payer: Self-pay

## 2017-11-18 NOTE — Telephone Encounter (Signed)
Received a fax from the [employer] stating that their records show no paid claims activity in the past 90 days. To ensure his grant remains active, the pharmacy must request and receive payment from PAN for a claim by 12/15/2017. If not received by this date, pts grant will be canceled.   Patients last refill was on 08/17/2017 for a 90 day supply. The pharmacy have reached out to pt to schedule refill but had to leave a message.   Will send document to scan center.   Called pt to update. Left message.   Tehya Leath, [place], CPhT 4:04 PM

## 2017-11-19 ENCOUNTER — Telehealth: Payer: Self-pay | Admitting: Rheumatology

## 2017-11-19 NOTE — Telephone Encounter (Signed)
Patient called, and left a message re Enbriel rx running out. Please call to advise.

## 2017-11-22 ENCOUNTER — Other Ambulatory Visit: Payer: Self-pay | Admitting: Rheumatology

## 2017-11-22 NOTE — Telephone Encounter (Signed)
Last Visit: 06/16/17 Next Visit: 11/29/17 Labs: 06/16/17 Stable TB Gold: 06/16/17 neg   Patient will update labs at his appointment  Okay to refill 30 day supply per Dr. Estanislado Pandy

## 2017-11-22 NOTE — Progress Notes (Signed)
Office Visit Note  Patient: Michael Hinton.             Date of Birth: [birth year]           MRN: [id number]             PCP: Cristy Folks, PA-C Referring: Grafton Folk* Visit Date: 11/29/2017 Occupation: @GUAROCC @    Subjective:  Medication monitoring   History of Present Illness: Michael Hinton. is a 67 y.o. male with history of seropositive rheumatoid arthritis.  Patient continues to inject Enbrel every other week.  He denies any recent flares of his rheumatoid arthritis.  He denies any joint pain or joint swelling at this time.  He denies any joint stiffness.  He reports that his bilateral elbow contractures and knee contractures are stable and have not been worsening.  He continues to have limited range of motion in his right knee.  He states that he feels his rheumatoid arthritis is very well controlled on Enbrel.  Activities of Daily Living:  Patient reports morning stiffness for 0  minutes.   Patient Denies nocturnal pain.  Difficulty dressing/grooming: Denies Difficulty climbing stairs: Denies Difficulty getting out of chair: Denies Difficulty using hands for taps, buttons, cutlery, and/or writing: Denies   Review of Systems  Constitutional: Negative for fatigue and night sweats.  HENT: Negative for mouth sores, trouble swallowing, trouble swallowing, mouth dryness and nose dryness.   Eyes: Negative for redness, visual disturbance and dryness.  Respiratory: Negative for cough, hemoptysis, shortness of breath and difficulty breathing.   Cardiovascular: Negative for chest pain, palpitations, hypertension, irregular heartbeat and swelling in legs/feet.  Gastrointestinal: Negative for blood in stool, constipation and diarrhea.  Endocrine: Negative for increased urination.  Genitourinary: Negative for painful urination.  Musculoskeletal: Negative for arthralgias, joint pain, joint swelling, myalgias, muscle weakness, morning stiffness, muscle  tenderness and myalgias.  Skin: Negative for color change, rash, hair loss, nodules/bumps, skin tightness, ulcers and sensitivity to sunlight.  Allergic/Immunologic: Negative for susceptible to infections.  Neurological: Negative for dizziness, fainting, memory loss, night sweats and weakness.  Hematological: Negative for swollen glands.  Psychiatric/Behavioral: Negative for depressed mood and sleep disturbance. The patient is not nervous/anxious.     PMFS History:  Patient Active Problem List   Diagnosis Date Noted  . ANA positive 01/22/2017  . 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 Rheumatoid arthritis involving multiple sites with positive rheumatoid factor ([place]) 06/02/2016  . High risk medication use 06/02/2016  . Hypertension 06/02/2016  . Vitamin D deficiency 06/02/2016    Past Medical History:  Diagnosis Date  . High risk medication use 06/02/2016  . Hypertension 06/02/2016  . Rheumatoid arthritis involving multiple sites with positive rheumatoid factor ([place]) 06/02/2016    Family History  Problem Relation Age of Onset  . Heart disease Mother   . Diabetes Mother   . Kidney disease Father   . Diabetes Daughter    History reviewed. No pertinent surgical history. Social History   Social History Narrative  . Not on file     Objective: Vital Signs: BP (!) 143/84 (BP Location: Left Arm, Patient Position: Sitting, Cuff Size: Normal)   Pulse (!) 55   Resp 17   Ht 5\' 11"  (1.937 m)   Wt 222 lb (100.7 kg)   BMI 30.96 kg/m    Physical Exam  Constitutional: He is oriented to person, place, and time.  He appears well-developed and well-nourished.  HENT:  Head: Normocephalic and atraumatic.  Eyes: Pupils are equal, round, and reactive to light. Conjunctivae and EOM are normal.  Neck: Normal range of motion. Neck supple.  Cardiovascular: Normal rate, regular rhythm and normal heart sounds.  Pulmonary/Chest:  Effort normal and breath sounds normal.  Abdominal: Soft. Bowel sounds are normal.  Lymphadenopathy:    He has no cervical adenopathy.  Neurological: He is alert and oriented to person, place, and time.  Skin: Skin is warm and dry. Capillary refill takes less than 2 seconds.  Psychiatric: He has a normal mood and affect. His behavior is normal.  Nursing note and vitals reviewed.    Musculoskeletal Exam: C-spine, thoracic spine, lumbar spine good range of motion.  No midline spinal tenderness.  No SI joint tenderness.  Shoulder joints good range of motion.  He has bilateral elbow joint contractures, worse on the left.  He has very limited range of motion of bilateral wrists.  He has synovial thickening of his MCP and PIP joints.  Ulnar deviation bilaterally.  He has no synovitis or tenderness on exam.  Hip joints good range of motion.  He has limited extension of his right knee.  No warmth or effusion was noted.  No tenderness of trochanteric bursa bilaterally.  CDAI Exam: CDAI Homunculus Exam:   Joint Counts:  CDAI Tender Joint count: 0 CDAI Swollen Joint count: 0  Global Assessments:  Patient Global Assessment: 1 Provider Global Assessment: 1  CDAI Calculated Score: 2    Investigation: No additional findings. CBC Latest Ref Rng & Units 06/16/2017 04/28/2017 12/24/2016  WBC 3.8 - 10.8 Thousand/uL 5.0 3.5(L) 3.5(L)  Hemoglobin 13.2 - 17.1 g/dL 12.8(L) 12.0(L) 11.8(L)  Hematocrit 38.5 - 50.0 % 39.4 35.9(L) 36.6(L)  Platelets 140 - 400 Thousand/uL 204 174 191   CMP Latest Ref Rng & Units 06/16/2017 04/28/2017 12/24/2016  Glucose 65 - 99 mg/dL 119(H) 140(H) 114(H)  BUN 7 - 25 mg/dL 15 16 19   Creatinine 0.70 - 1.25 mg/dL 1.09 1.12 1.16  Sodium 135 - 146 mmol/L 134(L) 138 140  Potassium 3.5 - 5.3 mmol/L 4.0 3.9 4.3  Chloride 98 - 110 mmol/L 101 107 108  CO2 20 - 32 mmol/L 24 21 22   Calcium 8.6 - 10.3 mg/dL 9.1 9.0 9.1  Total Protein 6.1 - 8.1 g/dL 7.9 7.3 7.1  Total Bilirubin 0.2 -  1.2 mg/dL 0.8 0.6 0.5  Alkaline Phos 40 - 115 U/L - - 82  AST 10 - 35 U/L 21 22 23   ALT 9 - 46 U/L 16 15 19     Imaging: No results found.  Speciality Comments: No specialty comments available.    Procedures:  No procedures performed Allergies: Patient has no known allergies.   Assessment / Plan:     Visit Diagnoses: Rheumatoid arthritis involving multiple sites with positive rheumatoid factor ([place]): He has no active synovitis on exam.  He has synovial thickening of MCPs and PIP joints of bilateral hands with ulnar deviation.  He has bilateral elbow joint and knee joint contractures, which are stable.  He will continue on Enbrel subcutaneous injections every other week.  He does not need any refills at this time.  We will perform x-rays of bilateral hands and bilateral feet at his next visit.     High risk medication use - Enbrel TB gold 06/16/17.  CBC and CMP will be drawn today to monitor for drug toxicity.  He will return in July and every  3 months for lab work.- Plan: CBC with Differential/Platelet, COMPLETE METABOLIC PANEL WITH GFR  Contracture of elbow joint, unspecified laterality - Bilateral: Stable   Contractures of both knees: Stable   Other medical conditions are listed as follows:  Essential hypertension  ANA positive  Vitamin D deficiency: He takes a vitamin D supplement daily.  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   Orders: Orders Placed This Encounter  Procedures  . CBC with Differential/Platelet  . COMPLETE METABOLIC PANEL WITH GFR   No orders of the defined types were placed in this encounter.     Follow-Up Instructions: Return in about 5 months (around 05/01/2018) for Rheumatoid arthritis.   Ofilia Neas, PA-C  Note - This record has been created using Dragon software.  Chart creation errors have been sought, but may not always  have been located. Such creation errors do not reflect on  the standard of medical care.

## 2017-11-22 NOTE — Telephone Encounter (Signed)
Patient has been scheduled a follow up visit for 11/29/17. Patient will update labs at that appointment. Sent refill for 30 day supply per Dr. Estanislado Pandy

## 2017-11-29 ENCOUNTER — Ambulatory Visit (INDEPENDENT_AMBULATORY_CARE_PROVIDER_SITE_OTHER): Payer: PPO | Admitting: Physician Assistant

## 2017-11-29 ENCOUNTER — Encounter: Payer: Self-pay | Admitting: Physician Assistant

## 2017-11-29 VITALS — BP 143/84 | HR 55 | Resp 17 | Ht 71.0 in | Wt 222.0 lb

## 2017-11-29 DIAGNOSIS — Z79899 Other long term (current) drug therapy: Secondary | ICD-10-CM | POA: Diagnosis not present

## 2017-11-29 DIAGNOSIS — I10 Essential (primary) hypertension: Secondary | ICD-10-CM | POA: Diagnosis not present

## 2017-11-29 DIAGNOSIS — M24529 Contracture, unspecified elbow: Secondary | ICD-10-CM | POA: Diagnosis not present

## 2017-11-29 DIAGNOSIS — R768 Other specified abnormal immunological findings in serum: Secondary | ICD-10-CM | POA: Diagnosis not present

## 2017-11-29 DIAGNOSIS — M24562 Contracture, left knee: Secondary | ICD-10-CM

## 2017-11-29 DIAGNOSIS — M0579 Rheumatoid arthritis with rheumatoid factor of multiple sites without organ or systems involvement: Secondary | ICD-10-CM

## 2017-11-29 DIAGNOSIS — E559 Vitamin D deficiency, unspecified: Secondary | ICD-10-CM

## 2017-11-29 DIAGNOSIS — M24561 Contracture, right knee: Secondary | ICD-10-CM | POA: Diagnosis not present

## 2017-11-29 DIAGNOSIS — D702 Other drug-induced agranulocytosis: Secondary | ICD-10-CM | POA: Diagnosis not present

## 2017-11-29 LAB — CBC WITH DIFFERENTIAL/PLATELET
BASOS PCT: 1 %
Basophils Absolute: 29 cells/uL (ref 0–200)
EOS ABS: 107 {cells}/uL (ref 15–500)
Eosinophils Relative: 3.7 %
HEMATOCRIT: 38.5 % (ref 38.5–50.0)
HEMOGLOBIN: 12.8 g/dL — AB (ref 13.2–17.1)
LYMPHS ABS: 1163 {cells}/uL (ref 850–3900)
MCH: 26.3 pg — AB (ref 27.0–33.0)
MCHC: 33.2 g/dL (ref 32.0–36.0)
MCV: 79.2 fL — AB (ref 80.0–100.0)
MPV: 10.6 fL (ref 7.5–12.5)
Monocytes Relative: 11.9 %
NEUTROS ABS: 1256 {cells}/uL — AB (ref 1500–7800)
Neutrophils Relative %: 43.3 %
Platelets: 177 10*3/uL (ref 140–400)
RBC: 4.86 10*6/uL (ref 4.20–5.80)
RDW: 14.5 % (ref 11.0–15.0)
TOTAL LYMPHOCYTE: 40.1 %
WBC: 2.9 10*3/uL — ABNORMAL LOW (ref 3.8–10.8)
WBCMIX: 345 {cells}/uL (ref 200–950)

## 2017-11-29 LAB — COMPLETE METABOLIC PANEL WITH GFR
AG RATIO: 1 (calc) (ref 1.0–2.5)
ALBUMIN MSPROF: 3.8 g/dL (ref 3.6–5.1)
ALT: 17 U/L (ref 9–46)
AST: 22 U/L (ref 10–35)
Alkaline phosphatase (APISO): 90 U/L (ref 40–115)
BUN / CREAT RATIO: 14 (calc) (ref 6–22)
BUN: 18 mg/dL (ref 7–25)
CALCIUM: 9.7 mg/dL (ref 8.6–10.3)
CHLORIDE: 109 mmol/L (ref 98–110)
CO2: 26 mmol/L (ref 20–32)
Creat: 1.27 mg/dL — ABNORMAL HIGH (ref 0.70–1.25)
GFR, EST AFRICAN AMERICAN: 69 mL/min/{1.73_m2} (ref >=60)
GFR, EST NON AFRICAN AMERICAN: 59 mL/min/{1.73_m2} — AB (ref >=60)
GLOBULIN: 3.9 g/dL — AB (ref 1.9–3.7)
Glucose, Bld: 114 mg/dL — ABNORMAL HIGH (ref 65–99)
POTASSIUM: 4.5 mmol/L (ref 3.5–5.3)
Sodium: 140 mmol/L (ref 135–146)
TOTAL PROTEIN: 7.7 g/dL (ref 6.1–8.1)
Total Bilirubin: 0.6 mg/dL (ref 0.2–1.2)

## 2017-11-29 NOTE — Patient Instructions (Signed)
Standing Labs We placed an order today for your standing lab work.    Please come back and get your standing labs in July and every 3 months   We have open lab Monday through Friday from 8:30-11:30 AM and 1:30-4:00 PM  at the office of Dr. Shaili Deveshwar.   You may experience shorter wait times on Monday and Friday afternoons. The office is located at [address], [address], [place], [place] [address] No appointment is necessary.   Labs are drawn by [facility].  You may receive a bill from [facility] for your lab work. If you have any questions regarding directions or hours of operation,  please call [phone].    

## 2017-11-30 NOTE — Progress Notes (Signed)
WBC low.  Please advise him to return in 1 month to recheck CBC.   Creatinine is elevated.  Please advise him to avoid NSAIDs.  We will continue to monitor.   Glucose mildly elevated.  All other labs are stable.

## 2017-12-27 DIAGNOSIS — D519 Vitamin B12 deficiency anemia, unspecified: Secondary | ICD-10-CM | POA: Diagnosis not present

## 2018-01-17 ENCOUNTER — Other Ambulatory Visit: Payer: Self-pay | Admitting: Rheumatology

## 2018-01-17 NOTE — Telephone Encounter (Signed)
Last Visit: 11/29/17 Next Visit: 05/02/18 Labs: 11/29/17 WBC low. Creatinine is elevated. Glucose mildly elevated. All other labs are stable. TB Gold: 06/16/17 Neg   Okay to refill per Dr. Estanislado Pandy

## 2018-02-07 DIAGNOSIS — D519 Vitamin B12 deficiency anemia, unspecified: Secondary | ICD-10-CM | POA: Diagnosis not present

## 2018-03-01 DIAGNOSIS — D519 Vitamin B12 deficiency anemia, unspecified: Secondary | ICD-10-CM | POA: Diagnosis not present

## 2018-03-01 DIAGNOSIS — M069 Rheumatoid arthritis, unspecified: Secondary | ICD-10-CM | POA: Diagnosis not present

## 2018-03-01 DIAGNOSIS — D649 Anemia, unspecified: Secondary | ICD-10-CM | POA: Diagnosis not present

## 2018-03-01 DIAGNOSIS — I10 Essential (primary) hypertension: Secondary | ICD-10-CM | POA: Diagnosis not present

## 2018-03-01 DIAGNOSIS — M5489 Other dorsalgia: Secondary | ICD-10-CM | POA: Diagnosis not present

## 2018-03-01 DIAGNOSIS — R739 Hyperglycemia, unspecified: Secondary | ICD-10-CM | POA: Diagnosis not present

## 2018-03-03 ENCOUNTER — Other Ambulatory Visit: Payer: Self-pay

## 2018-03-03 ENCOUNTER — Emergency Department
Admission: EM | Admit: 2018-03-03 | Discharge: 2018-03-03 | Disposition: A | Discharge status: Home / Self Care | Payer: PPO | Attending: Emergency Medicine | Admitting: Emergency Medicine

## 2018-03-03 ENCOUNTER — Encounter: Payer: Self-pay | Admitting: Emergency Medicine

## 2018-03-03 ENCOUNTER — Emergency Department: Payer: PPO

## 2018-03-03 DIAGNOSIS — M5136 Other intervertebral disc degeneration, lumbar region: Secondary | ICD-10-CM | POA: Diagnosis not present

## 2018-03-03 DIAGNOSIS — M5416 Radiculopathy, lumbar region: Secondary | ICD-10-CM | POA: Diagnosis not present

## 2018-03-03 DIAGNOSIS — M5117 Intervertebral disc disorders with radiculopathy, lumbosacral region: Secondary | ICD-10-CM | POA: Diagnosis not present

## 2018-03-03 DIAGNOSIS — M545 Low back pain: Secondary | ICD-10-CM | POA: Diagnosis not present

## 2018-03-03 DIAGNOSIS — Z79899 Other long term (current) drug therapy: Secondary | ICD-10-CM | POA: Diagnosis not present

## 2018-03-03 DIAGNOSIS — Z87891 Personal history of nicotine dependence: Secondary | ICD-10-CM | POA: Insufficient documentation

## 2018-03-03 DIAGNOSIS — M5137 Other intervertebral disc degeneration, lumbosacral region: Secondary | ICD-10-CM

## 2018-03-03 DIAGNOSIS — I10 Essential (primary) hypertension: Secondary | ICD-10-CM | POA: Insufficient documentation

## 2018-03-03 MED ORDER — HYDROCODONE-ACETAMINOPHEN 5-325 MG PO TABS: 1.0000 | ORAL_TABLET | Freq: Three times a day (TID) | ORAL | 0 refills | Status: AC | PRN

## 2018-03-03 MED ORDER — KETOROLAC TROMETHAMINE 30 MG/ML IJ SOLN
30.0000 mg | Freq: Once | INTRAMUSCULAR | Status: AC
  Administered 2018-03-03: 30 mg via INTRAMUSCULAR
  Filled 2018-03-03: qty 1

## 2018-03-03 MED ORDER — DICLOFENAC SODIUM 50 MG PO TBEC: 50.0000 mg | DELAYED_RELEASE_TABLET | Freq: Two times a day (BID) | ORAL | 0 refills | Status: AC

## 2018-03-03 MED ORDER — ORPHENADRINE CITRATE ER 100 MG PO TB12: 100.0000 mg | ORAL_TABLET | Freq: Two times a day (BID) | ORAL | 0 refills | Status: DC | PRN

## 2018-03-03 MED ORDER — ORPHENADRINE CITRATE 30 MG/ML IJ SOLN
60.0000 mg | INTRAMUSCULAR | Status: AC
  Administered 2018-03-03: 60 mg via INTRAMUSCULAR
  Filled 2018-03-03: qty 2

## 2018-03-03 MED ORDER — KETOROLAC TROMETHAMINE 10 MG PO TABS: 10.0000 mg | ORAL_TABLET | Freq: Three times a day (TID) | ORAL | 0 refills | Status: DC

## 2018-03-03 MED ORDER — HYDROCODONE-ACETAMINOPHEN 5-325 MG PO TABS
1.0000 | ORAL_TABLET | Freq: Once | ORAL | Status: AC
  Administered 2018-03-03: 1 via ORAL
  Filled 2018-03-03: qty 1

## 2018-03-03 NOTE — Discharge Instructions (Signed)
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 You also have some nerve irritation in the leg. Take the prescription meds as directed. Avoid taking OTC antiinflammatories (ibuprofen, Aleve) while taking the prescription meds. Follow-up with your provider for routine care, but return to the Emergency Department for worsening pain, numbness in your crotch, or leg weakness. Start the Diclofenac after the Toradol is completed.

## 2018-03-03 NOTE — ED Triage Notes (Signed)
C/O right lower back pain.  States injured back while lifting something.

## 2018-03-04 NOTE — ED Provider Notes (Signed)
[facility] Emergency Department Provider Note ____________________________________________  Time seen: 1823  I have reviewed the triage vital signs and the nursing notes.  HISTORY  Chief Complaint  Back Pain  HPI Michael Hinton. is a 67 y.o. male presents to the ED accompanied by his wife, for evaluation of low back pain.  Patient with a history of rheumatoid arthritis, reports onset of right-sided low back pain with some referral down the posterior aspect of the leg about a day after he admits to trying to move a small electronic keyboard in the house.  He describes symptoms have persisted even after he was evaluated by his primary care provider.  He was placed on Flexeril but denies any significant benefit.  He was advised to report to the ED if his symptoms worsen or persisted with the medication.  He denies any trauma injury, lateral bowel incontinence, foot drop, or leg weakness.  He reports his symptoms are aggravated by prolonged standing.  Past Medical History:  Diagnosis Date  . High risk medication use 06/02/2016  . Hypertension 06/02/2016  . Rheumatoid arthritis involving multiple sites with positive rheumatoid factor ([place]) 06/02/2016    Patient Active Problem List   Diagnosis Date Noted  . ANA positive 01/22/2017  . 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 Rheumatoid arthritis involving multiple sites with positive rheumatoid factor ([place]) 06/02/2016  . High risk medication use 06/02/2016  . Hypertension 06/02/2016  . Vitamin D deficiency 06/02/2016    History reviewed. No pertinent surgical history.  Prior to Admission medications   Medication Sig Start Date End Date Taking? Authorizing Provider  amLODipine (NORVASC) 5 MG tablet Take 5 mg by mouth daily.     [provider]  CYANOCOBALAMIN IJ Inject as directed every 30 (thirty) days.     [provider]   diclofenac (VOLTAREN) 50 MG EC tablet Take 1 tablet (50 mg total) by mouth 2 (two) times daily. 03/03/18 04/02/18  Shaili Donalson, Dannielle Karvonen, PA-C  ENBREL SURECLICK 50 MG/ML injection INJECT 0.98 MLS (50 MG TOTAL) INTO THE SKIN EVERY 14 DAYS. 01/17/18   Bo Merino, MD  folic acid (FOLVITE) 1 MG tablet Take 1 mg by mouth daily.    [provider]  HYDROcodone-acetaminophen (NORCO) 5-325 MG tablet Take 1 tablet by mouth 3 (three) times daily as needed for up to 3 days. 03/03/18 03/06/18  Addalie Calles, Dannielle Karvonen, PA-C  ketorolac (TORADOL) 10 MG tablet Take 1 tablet (10 mg total) by mouth every 8 (eight) hours. 03/03/18   Mehar Kirkwood, Dannielle Karvonen, PA-C  lisinopril (PRINIVIL,ZESTRIL) 40 MG tablet Take 40 mg by mouth daily.    [provider]  orphenadrine (NORFLEX) 100 MG tablet Take 1 tablet (100 mg total) by mouth 2 (two) times daily as needed for muscle spasms. 03/03/18   Kambria Grima, Dannielle Karvonen, PA-C    Allergies Patient has no known allergies.  Family History  Problem Relation Age of Onset  . Heart disease Mother   . Diabetes Mother   . Kidney disease Father   . Diabetes Daughter     Social History Social History   Tobacco Use  . Smoking status: Former Research scientist (life sciences)  . Smokeless tobacco: Never Used  Substance Use Topics  . Alcohol use: No    Frequency: Never  . Drug use: No    Review of Systems  Constitutional: Negative for fever. Cardiovascular: Negative for chest pain. Respiratory:  Negative for shortness of breath. Gastrointestinal: Negative for abdominal pain, vomiting and diarrhea. Genitourinary: Negative for dysuria. Musculoskeletal: Positive for back pain. Skin: Negative for rash. Neurological: Negative for headaches, focal weakness or numbness. ____________________________________________  PHYSICAL EXAM:  VITAL SIGNS: ED Triage Vitals  Enc Vitals Group     BP 03/03/18 1741 (!) 142/88     Pulse Rate 03/03/18 1741 70     Resp 03/03/18 1741 19      Temp 03/03/18 1741 97.6 F (36.4 C)     Temp Source 03/03/18 1741 Oral     SpO2 03/03/18 1741 97 %     Weight 03/03/18 1729 220 lb (99.8 kg)     Height 03/03/18 1729 5\' 11"  (1.803 m)     Head Circumference --      Peak Flow --      Pain Score 03/03/18 1729 10     Pain Loc --      Pain Edu? --      Excl. in [place]? --     Constitutional: Alert and oriented. Well appearing and in no distress. Head: Normocephalic and atraumatic. Cardiovascular: Normal rate, regular rhythm. Normal distal pulses. Respiratory: Normal respiratory effort. No wheezes/rales/rhonchi. Gastrointestinal: Soft and nontender. No distention. Musculoskeletal: Normal spinal alignment without midline tenderness, spasm, deformity, or step-off.  Patient with slow transition from sit to stand.  He is able to demonstrate normal hip flexion bilaterally.  Pain is localized to the right SI joint region.  Patient with normal resistance testing to the lower extremities bilaterally.  Nontender with normal range of motion in all extremities.  Neurologic: Nerves II through XII grossly intact.  Normal LE DTRs bilaterally.  Negative seated straight leg raise.  Normal gait without ataxia. Normal speech and language. No gross focal neurologic deficits are appreciated. Skin:  Skin is warm, dry and intact. No rash noted. Psychiatric: Mood and affect are normal. Patient exhibits appropriate insight and judgment. ___________________________________________   RADIOLOGY  Lumbar Spine IMPRESSION: Advanced spondylosis most pronounced in the lower lumbar spine. No acute bony abnormality. ____________________________________________  PROCEDURES  Procedures Toradol 30 mg IM Norflex 60 mg IM Norco 5-325 mg PO ____________________________________________  INITIAL IMPRESSION / ASSESSMENT AND PLAN / ED COURSE  Patient with ED evaluation of continued low back pain following mechanical injury.  Patient's exam is consistent with a lumbar strain  with radicular symptoms.  His x-ray confirms worsening of his previously established DDD and spondylolisthesis.  Patient reports improvement of his symptoms after demonstration of medications in the ED.  He will be discharged with scription's for ketorolac, orphenadrine, diclofenac, and #10 hydrocodone.  He is encouraged to follow-up with his primary provider for ongoing symptom management.  He is to return to the ED for acutely worsening symptoms as discussed.  He and his wife verbalized understanding of discharge instructions, and are grateful for the care received.  I reviewed the patient's prescription history over the last 12 months in the multi-state controlled substances database(s) that includes [place], [place], [place], [place], [place], [place], [place], [place], [place], [place], [place], [place], [place], and [place].  Results were notable for no prescription history.  ____________________________________________  FINAL CLINICAL IMPRESSION(S) / ED DIAGNOSES  Final diagnoses:  DDD (degenerative disc disease), lumbosacral  Lumbar radiculopathy      Carmie End, Dannielle Karvonen, PA-C 03/04/18 1729    Schuyler Amor, MD 03/04/18 2328

## 2018-03-07 ENCOUNTER — Other Ambulatory Visit: Payer: Self-pay

## 2018-03-07 NOTE — Progress Notes (Signed)
Office Visit Note  Patient: Michael Hinton.             Date of Birth: 12-Oct-1950           MRN: [id number]             PCP: Cristy Folks, PA-C Referring: [place]on Folk* Visit Date: 03/11/2018 Occupation: @GUAROCC @  Subjective:  Lower back pain with right-sided radiculopathy   History of Present Illness: Michael Chadderdon. is a 67 y.o. male with history of seropositive rheumatoid arthritis.  According to patient about a week ago he lifted a heavy object which weighed more than 100 pounds.  After that he started having pain and numbness in his right lower extremity.  He was seen by his PCP who diagnosed her to be muscle spasm.  Over the next 2 days the pain got worse and he was seen at the emergency room where he had x-rays of his lumbar spine.  He was given Vicodin and Toradol which helped.  He states since he ran out of Vicodin the pain has recurred now.  Continues to have numbness in his right lower extremity.  Is a painful.  His rheumatoid arthritis is well controlled.  He has been taking Enbrel only every other week.  Activities of Daily Living:  Patient reports morning stiffness for 30 minutes.   Patient Reports nocturnal pain.  Difficulty dressing/grooming: Reports Difficulty climbing stairs: Reports Difficulty getting out of chair: Reports Difficulty using hands for taps, buttons, cutlery, and/or writing: Denies  Review of Systems  Constitutional: Negative for fatigue.  HENT: Positive for mouth dryness. Negative for ear ringing and nose dryness.   Eyes: Negative for redness and dryness.  Respiratory: Negative for cough, apnea and difficulty breathing.   Cardiovascular: Positive for hypertension. Negative for palpitations and swelling in legs/feet.  Gastrointestinal: Negative for blood in stool, constipation and diarrhea.  Endocrine: Negative for increased urination.  Genitourinary: Negative for urgency.  Musculoskeletal: Positive for myalgias,  muscle weakness, morning stiffness and myalgias. Negative for arthralgias, joint pain and joint swelling.  Skin: Negative for rash, hair loss and sensitivity to sunlight.  Allergic/Immunologic: Negative for susceptible to infections.  Neurological: Positive for numbness. Negative for dizziness, headaches, memory loss and weakness.  Hematological: Negative for swollen glands.  Psychiatric/Behavioral: Positive for sleep disturbance. Negative for depressed mood. The patient is not nervous/anxious.     PMFS History:  Patient Active Problem List   Diagnosis Date Noted  . DDD (degenerative disc disease), lumbar 03/11/2018  . ANA positive 01/22/2017  . 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 Rheumatoid arthritis involving multiple sites with positive rheumatoid factor ([place]) 06/02/2016  . High risk medication use 06/02/2016  . Hypertension 06/02/2016  . Vitamin D deficiency 06/02/2016    Past Medical History:  Diagnosis Date  . High risk medication use 06/02/2016  . Hypertension 06/02/2016  . Rheumatoid arthritis involving multiple sites with positive rheumatoid factor ([place]) 06/02/2016    Family History  Problem Relation Age of Onset  . Heart disease Mother   . Diabetes Mother   . Kidney disease Father   . Diabetes Daughter    History reviewed. No pertinent surgical history. Social History   Social History Narrative  . Not on file    Objective: Vital Signs: BP (!) 146/80 (BP Location: Left Arm, Patient Position: Sitting, Cuff Size: Normal)   Pulse 69   Ht 5'  11" (1.803 m)   Wt 221 lb (100.2 kg)   BMI 30.82 kg/m    Physical Exam  Constitutional: He is oriented to person, place, and time. He appears well-developed and well-nourished.  HENT:  Head: Normocephalic and atraumatic.  Eyes: Pupils are equal, round, and reactive to light. Conjunctivae and EOM are normal.  Neck: Normal range of motion. Neck supple.    Cardiovascular: Normal rate, regular rhythm and normal heart sounds.  Pulmonary/Chest: Effort normal and breath sounds normal.  Abdominal: Soft. Bowel sounds are normal.  Neurological: He is alert and oriented to person, place, and time.  Skin: Skin is warm and dry. Capillary refill takes less than 2 seconds.  Psychiatric: He has a normal mood and affect. His behavior is normal.  Nursing note and vitals reviewed.    Musculoskeletal Exam: C-spine thoracic spine good range of motion.  He has severe lower back pain with limited range of motion.  He has right-sided radiculopathy.  Straight leg raising test was positive.  Shoulder joints were in good range of motion.  He has bilateral elbow joint contractures which are old.  He had bilateral synovial thickening with ulnar deviation without any active synovitis.  Hip joints knee joints ankles MTPs PIPs were in good range of motion with no synovitis.  CDAI Exam: CDAI Homunculus Exam:   Joint Counts:  CDAI Tender Joint count: 0 CDAI Swollen Joint count: 0  Global Assessments:  Patient Global Assessment: 0 Provider Global Assessment: 0   Investigation: No additional findings.  Imaging: Dg Lumbar Spine Complete  Result Date: 03/03/2018 CLINICAL DATA:  Right low back pain, lifting injury EXAM: LUMBAR SPINE - COMPLETE 4+ VIEW COMPARISON:  01/17/2005 FINDINGS: Diffuse degenerative disc disease, most notable from L3-4 through L5-S1. Moderate to advanced degenerative facet disease, most pronounced from L3-4 through L5-S1. No fracture or subluxation. SI joints are symmetric and unremarkable. IMPRESSION: Advanced spondylosis most pronounced in the lower lumbar spine. No acute bony abnormality. Electronically Signed   By: Rolm Baptise M.D.   On: 03/03/2018 19:31    Recent Labs: Lab Results  Component Value Date   WBC 2.9 (L) 11/29/2017   HGB 12.8 (L) 11/29/2017   PLT 177 11/29/2017   NA 140 11/29/2017   K 4.5 11/29/2017   CL 109 11/29/2017    CO2 26 11/29/2017   GLUCOSE 114 (H) 11/29/2017   BUN 18 11/29/2017   CREATININE 1.27 (H) 11/29/2017   BILITOT 0.6 11/29/2017   ALKPHOS 82 12/24/2016   AST 22 11/29/2017   ALT 17 11/29/2017   PROT 7.7 11/29/2017   ALBUMIN 3.5 (L) 12/24/2016   CALCIUM 9.7 11/29/2017   GFRAA 69 11/29/2017   QFTBGOLD NEGATIVE 06/16/2017    Speciality Comments: No specialty comments available.  Procedures:  No procedures performed Allergies: Patient has no known allergies.   Assessment / Plan:     Visit Diagnoses: Rheumatoid arthritis involving multiple sites with positive rheumatoid factor (HCC)-his rheumatoid arthritis is well controlled without any active synovitis.  High risk medication use - Enbrel 50 mg sq q 2 wks - Plan: CBC with Differential/Platelet, COMPLETE METABOLIC PANEL WITH GFR today and then every 3 months to monitor for drug toxicity.  He will need to be called with his next labs.  Acute right-sided low back pain with right-sided sciatica-patient started having severe lower back pain after lifting a keyboard.  He has been to emergency room where he had to intramuscular Toradol and was given pain meds.  Now the pain  has recurred.  He describes pain to be severe.  He is having difficulty walking due to right right lower extremity pain and radiculopathy.  I will give him Depo-Medrol 80 mg intramuscularly today.  I will also place him on Medrol Dosepak.  I will schedule MRI of his lumbar spine to evaluate this further.  DDD (degenerative disc disease), lumbar  Contracture of elbow joint, unspecified laterality - Bilateral-stable  Contractures of both knees-stable  ANA positive  Other drug-induced neutropenia (HCC)  Vitamin D deficiency-he is on supplement.  Essential hypertension -his blood pressure is controlled.  Orders: Orders Placed This Encounter  Procedures  . MR LUMBAR SPINE WO CONTRAST  . CBC with Differential/Platelet  . COMPLETE METABOLIC PANEL WITH GFR   Meds  ordered this encounter  Medications  . methylPREDNISolone (MEDROL DOSEPAK) 4 MG TBPK tablet    Sig: Take 6 tabs x1 day, 5 tabs x1 day, 4 tabs x1 day, 3 tabs x1 day, 2 tabs x1 day and 1 tab x1 day.    Dispense:  21 tablet    Refill:  0  . DISCONTD: methylPREDNISolone acetate (DEPO-MEDROL) injection 80 mg  . methylPREDNISolone acetate (DEPO-MEDROL) injection 80 mg    Face-to-face time spent with patient was 30 minutes. Greater than 50% of time was spent in counseling and coordination of care.  Follow-Up Instructions: Return in about 5 months (around 08/11/2018) for Rheumatoid arthritis,ddd.   Bo Merino, MD  Note - This record has been created using Editor, commissioning.  Chart creation errors have been sought, but may not always  have been located. Such creation errors do not reflect on  the standard of medical care.

## 2018-03-07 NOTE — Patient Outreach (Signed)
[facility]) Care Management  03/07/2018  Courvoisier Hamblen 19-Oct-1950 [id number]   Referral Date:03/07/18 Referral Source: HTA UM-urgent Referral Reason: Patient needs medication co pay assistance.   Outreach Attempt #1 No answer.  HIPAA compliant voice message left.   Plan: RN CM will attempt patient again within 4 business days and send letter.     Jone Baseman, RN, MSN [facility] Care Management Care Management Coordinator Direct Line [phone] Toll Free: [phone]  Fax: [phone]

## 2018-03-09 ENCOUNTER — Other Ambulatory Visit: Payer: Self-pay

## 2018-03-09 NOTE — Patient Outreach (Signed)
[facility]) Care Management  03/09/2018  Michael Hinton 18-Dec-1950 [id number]   Referral Date:03/07/18 Referral Source: HTA UM-urgent Referral Reason: Patient needs medication co pay assistance.   Outreach Attempt #2 No answer.  HIPAA compliant voice message left.   Plan: RN CM will attempt patient again within 4 business days.  Jone Baseman, RN, MSN [place] Management Care Management Coordinator Direct Line [phone] Cell [phone] Toll Free: [phone]  Fax: [phone]

## 2018-03-10 ENCOUNTER — Other Ambulatory Visit: Payer: Self-pay

## 2018-03-10 NOTE — Patient Outreach (Signed)
[facility]) Care Management  03/10/2018  Michael Hinton [birth year] [id number]   Referral Date:03/07/18 Referral Source:HTA UM-urgent Referral Reason:Patient needs medication co pay assistance.   Outreach Attempt#3 No answer. HIPAA compliant voice message left.   Plan: RN CM will wait return phone call.  If no return call will close case.   Jone Baseman, RN, MSN [place] Management Care Management Coordinator Direct Line [phone] Cell [phone] Toll Free: [phone]  Fax: [phone]

## 2018-03-11 ENCOUNTER — Ambulatory Visit (INDEPENDENT_AMBULATORY_CARE_PROVIDER_SITE_OTHER): Payer: PPO | Admitting: Rheumatology

## 2018-03-11 ENCOUNTER — Ambulatory Visit (HOSPITAL_COMMUNITY)
Admission: RE | Admit: 2018-03-11 | Discharge: 2018-03-11 | Disposition: A | Discharge status: Home / Self Care | Payer: PPO | Source: Ambulatory Visit | Attending: Rheumatology | Admitting: Rheumatology

## 2018-03-11 ENCOUNTER — Encounter: Payer: Self-pay | Admitting: Rheumatology

## 2018-03-11 VITALS — BP 146/80 | HR 69 | Ht 71.0 in | Wt 221.0 lb

## 2018-03-11 DIAGNOSIS — R7689 Other specified abnormal immunological findings in serum: Secondary | ICD-10-CM

## 2018-03-11 DIAGNOSIS — M5136 Other intervertebral disc degeneration, lumbar region: Secondary | ICD-10-CM | POA: Diagnosis not present

## 2018-03-11 DIAGNOSIS — R768 Other specified abnormal immunological findings in serum: Secondary | ICD-10-CM

## 2018-03-11 DIAGNOSIS — I10 Essential (primary) hypertension: Secondary | ICD-10-CM

## 2018-03-11 DIAGNOSIS — M5441 Lumbago with sciatica, right side: Secondary | ICD-10-CM

## 2018-03-11 DIAGNOSIS — M24529 Contracture, unspecified elbow: Secondary | ICD-10-CM

## 2018-03-11 DIAGNOSIS — M51369 Other intervertebral disc degeneration, lumbar region without mention of lumbar back pain or lower extremity pain: Secondary | ICD-10-CM

## 2018-03-11 DIAGNOSIS — M24561 Contracture, right knee: Secondary | ICD-10-CM | POA: Diagnosis not present

## 2018-03-11 DIAGNOSIS — Z79899 Other long term (current) drug therapy: Secondary | ICD-10-CM | POA: Diagnosis not present

## 2018-03-11 DIAGNOSIS — D702 Other drug-induced agranulocytosis: Secondary | ICD-10-CM | POA: Diagnosis not present

## 2018-03-11 DIAGNOSIS — M48061 Spinal stenosis, lumbar region without neurogenic claudication: Secondary | ICD-10-CM | POA: Insufficient documentation

## 2018-03-11 DIAGNOSIS — M545 Low back pain: Secondary | ICD-10-CM | POA: Diagnosis not present

## 2018-03-11 DIAGNOSIS — M5416 Radiculopathy, lumbar region: Secondary | ICD-10-CM

## 2018-03-11 DIAGNOSIS — M24562 Contracture, left knee: Secondary | ICD-10-CM | POA: Diagnosis not present

## 2018-03-11 DIAGNOSIS — R937 Abnormal findings on diagnostic imaging of other parts of musculoskeletal system: Secondary | ICD-10-CM | POA: Insufficient documentation

## 2018-03-11 DIAGNOSIS — E559 Vitamin D deficiency, unspecified: Secondary | ICD-10-CM

## 2018-03-11 DIAGNOSIS — M0579 Rheumatoid arthritis with rheumatoid factor of multiple sites without organ or systems involvement: Secondary | ICD-10-CM

## 2018-03-11 LAB — CBC WITH DIFFERENTIAL/PLATELET
BASOS ABS: 21 {cells}/uL (ref 0–200)
BASOS PCT: 0.8 %
EOS ABS: 88 {cells}/uL (ref 15–500)
Eosinophils Relative: 3.4 %
HEMATOCRIT: 41.1 % (ref 38.5–50.0)
HEMOGLOBIN: 13.4 g/dL (ref 13.2–17.1)
LYMPHS ABS: 858 {cells}/uL (ref 850–3900)
MCH: 26.2 pg — AB (ref 27.0–33.0)
MCHC: 32.6 g/dL (ref 32.0–36.0)
MCV: 80.3 fL (ref 80.0–100.0)
MONOS PCT: 8.4 %
MPV: 10.6 fL (ref 7.5–12.5)
NEUTROS ABS: 1414 {cells}/uL — AB (ref 1500–7800)
Neutrophils Relative %: 54.4 %
Platelets: 174 10*3/uL (ref 140–400)
RBC: 5.12 10*6/uL (ref 4.20–5.80)
RDW: 14.2 % (ref 11.0–15.0)
Total Lymphocyte: 33 %
WBC: 2.6 10*3/uL — ABNORMAL LOW (ref 3.8–10.8)
WBCMIX: 218 {cells}/uL (ref 200–950)

## 2018-03-11 LAB — COMPLETE METABOLIC PANEL WITH GFR
AG RATIO: 1.1 (calc) (ref 1.0–2.5)
ALBUMIN MSPROF: 4 g/dL (ref 3.6–5.1)
ALKALINE PHOSPHATASE (APISO): 86 U/L (ref 40–115)
ALT: 30 U/L (ref 9–46)
AST: 46 U/L — ABNORMAL HIGH (ref 10–35)
BILIRUBIN TOTAL: 0.7 mg/dL (ref 0.2–1.2)
BUN: 19 mg/dL (ref 7–25)
CHLORIDE: 104 mmol/L (ref 98–110)
CO2: 21 mmol/L (ref 20–32)
Calcium: 9.5 mg/dL (ref 8.6–10.3)
Creat: 1.24 mg/dL (ref 0.70–1.25)
GFR, EST AFRICAN AMERICAN: 71 mL/min/{1.73_m2} (ref >=60)
GFR, Est Non African American: 61 mL/min/{1.73_m2} (ref >=60)
GLOBULIN: 3.6 g/dL (ref 1.9–3.7)
Glucose, Bld: 105 mg/dL — ABNORMAL HIGH (ref 65–99)
Potassium: 4.1 mmol/L (ref 3.5–5.3)
SODIUM: 135 mmol/L (ref 135–146)
TOTAL PROTEIN: 7.6 g/dL (ref 6.1–8.1)

## 2018-03-11 MED ORDER — METHYLPREDNISOLONE 4 MG PO TBPK: ORAL_TABLET | ORAL | 0 refills | Status: DC

## 2018-03-11 MED ORDER — METHYLPREDNISOLONE ACETATE 80 MG/ML IJ SUSP: 80.0000 mg | Freq: Once | INTRAMUSCULAR | Status: DC

## 2018-03-11 MED ORDER — METHYLPREDNISOLONE ACETATE 40 MG/ML IJ SUSP
80.0000 mg | Freq: Once | INTRAMUSCULAR | Status: AC
  Administered 2018-03-11: 80 mg via INTRAMUSCULAR

## 2018-03-11 NOTE — Patient Instructions (Signed)
Standing Labs We placed an order today for your standing lab work.    Please come back and get your standing labs in November and every 3 months  TB gold in November  We have open lab Monday through Friday from 8:30-11:30 AM and 1:30-4:00 PM  at the office of Dr. Bo Merino.   You may experience shorter wait times on Monday and Friday afternoons. The office is located at [address], [place], [place], [place] [address] No appointment is necessary.   Labs are drawn by [facility].  You may receive a bill from [facility] for your lab work. If you have any questions regarding directions or hours of operation,  please call [phone].

## 2018-03-14 ENCOUNTER — Other Ambulatory Visit: Payer: Self-pay | Admitting: *Deleted

## 2018-03-14 ENCOUNTER — Other Ambulatory Visit: Payer: Self-pay | Admitting: Hematology & Oncology

## 2018-03-14 ENCOUNTER — Telehealth: Payer: Self-pay | Admitting: Rheumatology

## 2018-03-14 DIAGNOSIS — M5441 Lumbago with sciatica, right side: Secondary | ICD-10-CM

## 2018-03-14 DIAGNOSIS — M5442 Lumbago with sciatica, left side: Principal | ICD-10-CM

## 2018-03-14 DIAGNOSIS — D702 Other drug-induced agranulocytosis: Secondary | ICD-10-CM

## 2018-03-14 NOTE — Telephone Encounter (Signed)
I spoke with patient's wife Solmon Ice at [phone] at her cell number.  We discussed MRI findings and left findings.  She is aware that patient has appointment tomorrow morning with oncology.

## 2018-03-14 NOTE — Telephone Encounter (Signed)
Patient's wife Solmon Ice called requesting a return call to discuss [place]'s MRI results.

## 2018-03-14 NOTE — Progress Notes (Signed)
Patient states that his arthritis is very well controlled.  Have advised him to change the Enbrel dosing to every 3 weeks.  We will continue to monitor his labs.

## 2018-03-14 NOTE — Progress Notes (Signed)
I had detailed discussion regarding his MRI results with Dr. Vertell Limber today.  He very kindly reviewed his MRI himself.  He also recommended that patient should see an oncologist.  I spoke with Dr. Marin Olp who suggested that patient should be seen at [facility] oncology clinic.  The patient has been is scheduled for tomorrow morning.  I notified patient and his wife.

## 2018-03-15 ENCOUNTER — Other Ambulatory Visit: Payer: Self-pay

## 2018-03-15 ENCOUNTER — Inpatient Hospital Stay: Payer: PPO | Attending: Oncology | Admitting: Oncology

## 2018-03-15 ENCOUNTER — Encounter: Payer: Self-pay | Admitting: Oncology

## 2018-03-15 ENCOUNTER — Inpatient Hospital Stay: Payer: PPO

## 2018-03-15 VITALS — BP 154/92 | HR 62 | Temp 97.8°F | Resp 18 | Ht 71.0 in | Wt 216.5 lb

## 2018-03-15 DIAGNOSIS — G8929 Other chronic pain: Secondary | ICD-10-CM | POA: Insufficient documentation

## 2018-03-15 DIAGNOSIS — M5441 Lumbago with sciatica, right side: Secondary | ICD-10-CM | POA: Insufficient documentation

## 2018-03-15 DIAGNOSIS — M069 Rheumatoid arthritis, unspecified: Secondary | ICD-10-CM | POA: Diagnosis not present

## 2018-03-15 DIAGNOSIS — D709 Neutropenia, unspecified: Secondary | ICD-10-CM | POA: Diagnosis not present

## 2018-03-15 DIAGNOSIS — Z87891 Personal history of nicotine dependence: Secondary | ICD-10-CM | POA: Insufficient documentation

## 2018-03-15 DIAGNOSIS — R937 Abnormal findings on diagnostic imaging of other parts of musculoskeletal system: Secondary | ICD-10-CM | POA: Insufficient documentation

## 2018-03-15 DIAGNOSIS — R7303 Prediabetes: Secondary | ICD-10-CM | POA: Diagnosis not present

## 2018-03-15 DIAGNOSIS — I10 Essential (primary) hypertension: Secondary | ICD-10-CM | POA: Diagnosis not present

## 2018-03-15 LAB — IRON AND TIBC
Iron: 108 ug/dL (ref 45–182)
SATURATION RATIOS: 39 % (ref 17.9–39.5)
TIBC: 279 ug/dL (ref 250–450)
UIBC: 171 ug/dL

## 2018-03-15 LAB — CBC WITH DIFFERENTIAL/PLATELET
BASOS PCT: 1 %
Basophils Absolute: 0 10*3/uL (ref 0–0.1)
Eosinophils Absolute: 0 10*3/uL (ref 0–0.7)
Eosinophils Relative: 0 %
HEMATOCRIT: 39.6 % — AB (ref 40.0–52.0)
Hemoglobin: 12.9 g/dL — ABNORMAL LOW (ref 13.0–18.0)
LYMPHS PCT: 27 %
Lymphs Abs: 1.5 10*3/uL (ref 1.0–3.6)
MCH: 26.7 pg (ref 26.0–34.0)
MCHC: 32.6 g/dL (ref 32.0–36.0)
MCV: 81.9 fL (ref 80.0–100.0)
MONO ABS: 0.5 10*3/uL (ref 0.2–1.0)
MONOS PCT: 9 %
NEUTROS ABS: 3.6 10*3/uL (ref 1.4–6.5)
Neutrophils Relative %: 63 %
Platelets: 186 10*3/uL (ref 150–440)
RBC: 4.83 MIL/uL (ref 4.40–5.90)
RDW: 15.1 % — AB (ref 11.5–14.5)
WBC: 5.7 10*3/uL (ref 3.8–10.6)

## 2018-03-15 LAB — VITAMIN B12: Vitamin B-12: 322 pg/mL (ref 180–914)

## 2018-03-15 LAB — TSH: TSH: 1.447 u[IU]/mL (ref 0.350–4.500)

## 2018-03-15 LAB — FOLATE: Folate: 31 ng/mL (ref >5.9)

## 2018-03-15 LAB — FERRITIN: Ferritin: 493 ng/mL — ABNORMAL HIGH (ref 24–336)

## 2018-03-15 LAB — TECHNOLOGIST SMEAR REVIEW: Tech Review: ADEQUATE

## 2018-03-15 LAB — LACTATE DEHYDROGENASE: LDH: 185 U/L (ref 98–192)

## 2018-03-15 MED ORDER — GABAPENTIN 100 MG PO CAPS: 100.0000 mg | ORAL_CAPSULE | Freq: Three times a day (TID) | ORAL | 0 refills | Status: DC

## 2018-03-15 NOTE — Progress Notes (Signed)
Patient here for initial evaluaiton. C/o pain to right hip that radiates to the knee, it makes right leg numb.

## 2018-03-15 NOTE — Progress Notes (Signed)
Hematology/Oncology Consult note [name] Telephone:([phone] [phone]   Patient Care Team: Cristy Folks, PA-C as PCP - General (Physician Assistant) Jon Billings, RN as [place] Management  REFERRING PROVIDER: Dr.Daveshwar CHIEF COMPLAINTS/REASON FOR VISIT:  Evaluation of abnormal MRI results.  HISTORY OF PRESENTING ILLNESS:  Michael Hinton. is a  67 y.o.  male with PMH listed below who was referred to me for evaluation of abnormal MRI results.  Patient has a history of rheumatoid arthritis follows up with rheumatologist on Enbrel injection.  Reports joint pain symptoms being controlled. Patient recently seen by [facility] for right-sided radiculopathy, right-sided back pain is sharp, associated with shooting down right lower extremity, started after lifting heavy objects.  He went to emergency room and had x-ray of lumbar spine.  He was started on narcotics which helped with his pain. MRI was orderedHis work-up for right-sided sciatica pain.  Patient was also given Depo-Medrol intramuscular.  He was also started on Medrol Dosepak. MRI lumbar spine without contrast was obtained on 03/11/2018.  Image was independently reviewed.  Showed diffusely abnormal bone marrow signal throughout the lumbar spine.  Questionable chronic anemia versus long smoking history, marrow replacement disorders including multiple myeloma cannot be excluded.  Recommend further characterization with postcontrast MRI is recommended. 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  Multilevel moderate to severe neural foraminal stenosis worst at left L3, bilateral L4 and left L5. Patient was referred to oncology for further evaluation of abnormal MRI bone marrow signals.   On exam back pain, he reports feeling well.  Denies any weight loss, night sweating, fever or chills.   Review of Systems  Constitutional: Negative  for chills, fever, malaise/fatigue and weight loss.  HENT: Negative for nosebleeds and sore throat.   Eyes: Negative for double vision, photophobia and redness.  Respiratory: Negative for cough, shortness of breath and wheezing.   Cardiovascular: Negative for chest pain, palpitations and orthopnea.  Gastrointestinal: Negative for abdominal pain, blood in stool, nausea and vomiting.  Genitourinary: Negative for dysuria.  Musculoskeletal: Positive for back pain. Negative for myalgias and neck pain.  Skin: Negative for itching and rash.  Neurological: Negative for dizziness, tingling and tremors.  Endo/Heme/Allergies: Negative for environmental allergies. Does not bruise/bleed easily.  Psychiatric/Behavioral: Negative for depression.    MEDICAL HISTORY:  Past Medical History:  Diagnosis Date  . High risk medication use 06/02/2016  . Hypertension 06/02/2016  . Prediabetes   . Rheumatoid arthritis involving multiple sites with positive rheumatoid factor ([place]) 06/02/2016    SURGICAL HISTORY: History reviewed. No pertinent surgical history.  SOCIAL HISTORY: Social History   Socioeconomic History  . Marital status: Married    Spouse name: Not on file  . Number of children: Not on file  . Years of education: Not on file  . Highest education level: Not on file  Occupational History  . Not on file  Social Needs  . Financial resource strain: Not on file  . Food insecurity:    Worry: Not on file    Inability: Not on file  . Transportation needs:    Medical: Not on file    Non-medical: Not on file  Tobacco Use  . Smoking status: Former Smoker    Last attempt to quit: 03/15/1978    Years since quitting: 40.0  . Smokeless tobacco: Never Used  Substance and Sexual Activity  . Alcohol use: No    Frequency: Never  . Drug use:  No  . Sexual activity: Not on file  Lifestyle  . Physical activity:    Days per week: Not on file    Minutes per session: Not on file  . Stress: Not on  file  Relationships  . Social connections:    Talks on phone: Not on file    Gets together: Not on file    Attends religious service: Not on file    Active member of club or organization: Not on file    Attends meetings of clubs or organizations: Not on file    Relationship status: Not on file  . Intimate partner violence:    Fear of current or ex partner: Not on file    Emotionally abused: Not on file    Physically abused: Not on file    Forced sexual activity: Not on file  Other Topics Concern  . Not on file  Social History Narrative  . Not on file    FAMILY HISTORY: Family History  Problem Relation Age of Onset  . Heart disease Mother   . Diabetes Mother   . Kidney disease Father   . Diabetes Father   . Diabetes Daughter     ALLERGIES:  has No Known Allergies.  MEDICATIONS:  Current Outpatient Medications  Medication Sig Dispense Refill  . acetaminophen (TYLENOL) 500 MG tablet Take 1,000 mg by mouth every 4 (four) hours as needed.    Marland Kitchen amLODipine (NORVASC) 5 MG tablet Take 5 mg by mouth daily.     . CYANOCOBALAMIN IJ Inject as directed every 30 (thirty) days.     . diclofenac (VOLTAREN) 50 MG EC tablet Take 1 tablet (50 mg total) by mouth 2 (two) times daily. 60 tablet 0  . ENBREL SURECLICK 50 MG/ML injection INJECT 0.98 MLS (50 MG TOTAL) INTO THE SKIN EVERY 14 DAYS. 1.61 mL 0  . folic acid (FOLVITE) 1 MG tablet Take 1 mg by mouth daily.    Marland Kitchen lisinopril (PRINIVIL,ZESTRIL) 40 MG tablet Take 40 mg by mouth daily.    . methylPREDNISolone (MEDROL DOSEPAK) 4 MG TBPK tablet Take 6 tabs x1 day, 5 tabs x1 day, 4 tabs x1 day, 3 tabs x1 day, 2 tabs x1 day and 1 tab x1 day. 21 tablet 0  . gabapentin (NEURONTIN) 100 MG capsule Take 1 capsule (100 mg total) by mouth 3 (three) times daily. 90 capsule 0  . ketorolac (TORADOL) 10 MG tablet Take 1 tablet (10 mg total) by mouth every 8 (eight) hours. (Patient not taking: Reported on 03/15/2018) 15 tablet 0  . orphenadrine (NORFLEX) 100 MG  tablet Take 1 tablet (100 mg total) by mouth 2 (two) times daily as needed for muscle spasms. (Patient not taking: Reported on 03/15/2018) 20 tablet 0   No current facility-administered medications for this visit.      PHYSICAL EXAMINATION: ECOG PERFORMANCE STATUS: 1 - Symptomatic but completely ambulatory Vitals:   03/15/18 0834  BP: (!) 154/92  Pulse: 62  Resp: 18  Temp: 97.8 F (36.6 C)   Filed Weights   03/15/18 0834  Weight: 216 lb 8 oz (98.2 kg)    Physical Exam  Constitutional: He is oriented to person, place, and time. He appears well-developed and well-nourished. No distress.  HENT:  Head: Normocephalic and atraumatic.  Right Ear: External ear normal.  Left Ear: External ear normal.  Mouth/Throat: Oropharynx is clear and moist.  Eyes: Pupils are equal, round, and reactive to light. EOM are normal. No scleral icterus.  Neck: Normal  range of motion. Neck supple.  Cardiovascular: Normal rate, regular rhythm and normal heart sounds.  Pulmonary/Chest: Effort normal. No respiratory distress. He has no wheezes.  Abdominal: Soft. Bowel sounds are normal. He exhibits no distension and no mass. There is no tenderness.  Musculoskeletal: Normal range of motion. He exhibits no edema or deformity.  Neurological: He is alert and oriented to person, place, and time. No cranial nerve deficit. Coordination normal.  Right LE positive straight leg elevation test.   Skin: Skin is warm and dry. No rash noted. No erythema.  Psychiatric: He has a normal mood and affect. His behavior is normal. Thought content normal.     LABORATORY DATA:  I have reviewed the data as listed Lab Results  Component Value Date   WBC 5.7 03/15/2018   HGB 12.9 (L) 03/15/2018   HCT 39.6 (L) 03/15/2018   MCV 81.9 03/15/2018   PLT 186 03/15/2018   Recent Labs    06/16/17 0908 11/29/17 0954 03/11/18 1044  NA 134* 140 135  K 4.0 4.5 4.1  CL 101 109 104  CO2 24 26 21   GLUCOSE 119* 114* 105*  BUN 15 18  19   CREATININE 1.09 1.27* 1.24  CALCIUM 9.1 9.7 9.5  GFRNONAA 72 59* 61  GFRAA 83 69 71  PROT 7.9 7.7 7.6  AST 21 22 46*  ALT 16 17 30   BILITOT 0.8 0.6 0.7   Iron/TIBC/Ferritin/ %Sat    Component Value Date/Time   IRON 108 03/15/2018 0943   TIBC 279 03/15/2018 0943   FERRITIN 493 (H) 03/15/2018 0943   IRONPCTSAT 39 03/15/2018 0943        ASSESSMENT & PLAN:  1. Abnormal MRI, spine   2. Neutropenia, unspecified type ([employer])   3. Chronic right-sided low back pain with right-sided sciatica    MRI lumbar spine was independently reviewed and discussed with patient and his wife. Diffuse abnormal bone marrow signal on MRI scan is nonspecific.  Can be secondary to both benign condition or bone marrow replacing disorders. Discussed with patient that I will start with checking CBC, CMP, SPEP, free light chain ratio, immunofixation, flow cytometry. Repeat MRI lumbar spine with contrast. Neutropenia, possibly secondary to Enbrel injection.  He has a normal ANC back in June 11, 2017 and timing of onset of neutropenia fits starting time of Enbrel injection.  Check folate, B12, TSH, smear review, ferritin and iron LDH. For patient's back pain, patient reports Toradol, Dosepak, Tylenol are not strong enough controlling his pain. Recommend patient to start low-dose gabapentin 100 mg 3 times daily with further titration with primary care physician/orthopedics for nerve pain control.  Orders Placed This Encounter  Procedures  . MR Lumbar Spine W Contrast    Standing Status:   Future    Standing Expiration Date:   03/15/2019    Order Specific Question:   ** REASON FOR EXAM (FREE TEXT)    Answer:   abnormal bone marrow signal on MRI wo contrast    Order Specific Question:   If indicated for the ordered procedure, I authorize the administration of contrast media per Radiology protocol    Answer:   Yes    Order Specific Question:   What is the patient's sedation requirement?    Answer:   No  Sedation    Order Specific Question:   Does the patient have a pacemaker or implanted devices?    Answer:   No    Order Specific Question:   Radiology Contrast Protocol -  do NOT remove file path    Answer:   [email or web address]    Order Specific Question:   Preferred imaging location?    Answer:   [facility] (table limit-300lbs)  . Multiple Myeloma Panel (SPEP&IFE w/QIG)    Standing Status:   Future    Number of Occurrences:   1    Standing Expiration Date:   03/16/2019  . Kappa/lambda light chains    Standing Status:   Future    Number of Occurrences:   1    Standing Expiration Date:   03/16/2019  . Flow cytometry panel-leukemia/lymphoma work-up    Standing Status:   Future    Number of Occurrences:   1    Standing Expiration Date:   03/16/2019  . Folate    Standing Status:   Future    Number of Occurrences:   1    Standing Expiration Date:   03/16/2019  . Vitamin B12    Standing Status:   Future    Number of Occurrences:   1    Standing Expiration Date:   03/16/2019  . Lactate dehydrogenase    Standing Status:   Future    Number of Occurrences:   1    Standing Expiration Date:   03/16/2019  . Iron and TIBC    Standing Status:   Future    Number of Occurrences:   1    Standing Expiration Date:   03/16/2019  . Ferritin    Standing Status:   Future    Number of Occurrences:   1    Standing Expiration Date:   03/16/2019  . TSH    Standing Status:   Future    Number of Occurrences:   1    Standing Expiration Date:   03/16/2019  . Technologist smear review    Standing Status:   Future    Number of Occurrences:   1    Standing Expiration Date:   03/16/2019  . CBC with Differential/Platelet    Standing Status:   Future    Number of Occurrences:   1    Standing Expiration Date:   03/16/2019    All questions were answered. The patient knows to call the clinic with any problems questions or concerns.  Return of visit: 2 weeks to discuss lab results.   Thank you  for this kind referral and the opportunity to participate in the care of this patient. A copy of today's note is routed to referring provider  Total face to face encounter time for this patient visit was 60 min. >50% of the time was  spent in counseling and coordination of care.    Earlie Server, MD, PhD Hematology Oncology [facility] at [facility] Pager- [phone] 03/15/2018

## 2018-03-16 LAB — KAPPA/LAMBDA LIGHT CHAINS
KAPPA FREE LGHT CHN: 101 mg/L — AB (ref 3.3–19.4)
KAPPA, LAMDA LIGHT CHAIN RATIO: 3.99 — AB (ref 0.26–1.65)
LAMDA FREE LIGHT CHAINS: 25.3 mg/L (ref 5.7–26.3)

## 2018-03-17 LAB — MULTIPLE MYELOMA PANEL, SERUM
ALBUMIN/GLOB SERPL: 0.9 (ref 0.7–1.7)
ALPHA 1: 0.2 g/dL (ref 0.0–0.4)
ALPHA2 GLOB SERPL ELPH-MCNC: 0.8 g/dL (ref 0.4–1.0)
Albumin SerPl Elph-Mcnc: 3.6 g/dL (ref 2.9–4.4)
B-Globulin SerPl Elph-Mcnc: 2 g/dL — ABNORMAL HIGH (ref 0.7–1.3)
GLOBULIN, TOTAL: 4.3 g/dL — AB (ref 2.2–3.9)
Gamma Glob SerPl Elph-Mcnc: 1.3 g/dL (ref 0.4–1.8)
IGG (IMMUNOGLOBIN G), SERUM: 1490 mg/dL (ref 700–1600)
IGM (IMMUNOGLOBULIN M), SRM: 42 mg/dL (ref 20–172)
IgA: 1333 mg/dL — ABNORMAL HIGH (ref 61–437)
Total Protein ELP: 7.9 g/dL (ref 6.0–8.5)

## 2018-03-22 LAB — COMP PANEL: LEUKEMIA/LYMPHOMA

## 2018-03-23 ENCOUNTER — Other Ambulatory Visit: Payer: Self-pay | Admitting: Rheumatology

## 2018-03-23 NOTE — Telephone Encounter (Signed)
Last visit: 03/11/2018 Next visit: 08/16/2018 Labs: 03/11/2018  TB gold: 06/16/2017 negative   Okay to refill per Dr. Estanislado Pandy.

## 2018-03-24 ENCOUNTER — Other Ambulatory Visit: Payer: Self-pay

## 2018-03-24 ENCOUNTER — Telehealth: Payer: Self-pay | Admitting: Hematology & Oncology

## 2018-03-24 NOTE — Patient Outreach (Signed)
[facility]) Care Management  03/24/2018  Benard Minturn. 1950-12-12 [id number]   Multiple attempts to establish contact with patient without success. No response from letter mailed to patient.   Plan: RN CM will close case at this time.   Jone Baseman, RN, MSN [place] Management Care Management Coordinator Direct Line [phone] Cell [phone] Toll Free: [phone]  Fax: [phone]

## 2018-03-24 NOTE — Telephone Encounter (Signed)
Left voicemail for Vivien Rota at Centralized radiology scheduling @ [phone] to set up CT biopsy and bone marrow appts per order. Informed to contact patient to schedule appointment

## 2018-03-26 ENCOUNTER — Ambulatory Visit
Admission: RE | Admit: 2018-03-26 | Discharge: 2018-03-26 | Disposition: A | Discharge status: Home / Self Care | Payer: PPO | Source: Ambulatory Visit | Attending: Oncology | Admitting: Oncology

## 2018-03-26 DIAGNOSIS — M48061 Spinal stenosis, lumbar region without neurogenic claudication: Secondary | ICD-10-CM | POA: Diagnosis not present

## 2018-03-26 DIAGNOSIS — M5126 Other intervertebral disc displacement, lumbar region: Secondary | ICD-10-CM | POA: Diagnosis not present

## 2018-03-26 DIAGNOSIS — R937 Abnormal findings on diagnostic imaging of other parts of musculoskeletal system: Secondary | ICD-10-CM | POA: Diagnosis not present

## 2018-03-26 MED ORDER — GADOBENATE DIMEGLUMINE 529 MG/ML IV SOLN
20.0000 mL | Freq: Once | INTRAVENOUS | Status: AC | PRN
  Administered 2018-03-26: 20 mL via INTRAVENOUS

## 2018-03-29 ENCOUNTER — Other Ambulatory Visit: Payer: Self-pay

## 2018-03-29 ENCOUNTER — Encounter: Payer: Self-pay | Admitting: Oncology

## 2018-03-29 ENCOUNTER — Inpatient Hospital Stay (HOSPITAL_BASED_OUTPATIENT_CLINIC_OR_DEPARTMENT_OTHER): Payer: PPO | Admitting: Oncology

## 2018-03-29 VITALS — BP 140/79 | HR 85 | Temp 97.1°F | Resp 18 | Wt 217.3 lb

## 2018-03-29 DIAGNOSIS — G8929 Other chronic pain: Secondary | ICD-10-CM

## 2018-03-29 DIAGNOSIS — M069 Rheumatoid arthritis, unspecified: Secondary | ICD-10-CM | POA: Diagnosis not present

## 2018-03-29 DIAGNOSIS — R7303 Prediabetes: Secondary | ICD-10-CM

## 2018-03-29 DIAGNOSIS — R937 Abnormal findings on diagnostic imaging of other parts of musculoskeletal system: Secondary | ICD-10-CM

## 2018-03-29 DIAGNOSIS — D709 Neutropenia, unspecified: Secondary | ICD-10-CM

## 2018-03-29 DIAGNOSIS — M5441 Lumbago with sciatica, right side: Secondary | ICD-10-CM

## 2018-03-29 DIAGNOSIS — I10 Essential (primary) hypertension: Secondary | ICD-10-CM

## 2018-03-29 NOTE — Progress Notes (Signed)
Hematology/Oncology Consult note [facility] Telephone:([phone] Fax:([phone]   Patient Care Team: Cristy Folks, PA-C as PCP - General (Physician Assistant)  REFERRING PROVIDER: Dr.Daveshwar CHIEF COMPLAINTS/REASON FOR VISIT:  Evaluation of abnormal MRI results.  HISTORY OF PRESENTING ILLNESS:  Michael Longton. is a  67 y.o.  male with PMH listed below who was referred to me for evaluation of abnormal MRI results.  Patient has a history of rheumatoid arthritis follows up with rheumatologist on Enbrel injection.  Reports joint pain symptoms being controlled. Patient recently seen by [facility] for right-sided radiculopathy, right-sided back pain is sharp, associated with shooting down right lower extremity, started after lifting heavy objects.  He went to emergency room and had x-ray of lumbar spine.  He was started on narcotics which helped with his pain. MRI was orderedHis work-up for right-sided sciatica pain.  Patient was also given Depo-Medrol intramuscular.  He was also started on Medrol Dosepak. MRI lumbar spine without contrast was obtained on 03/11/2018.  Image was independently reviewed.  Showed diffusely abnormal bone marrow signal throughout the lumbar spine.  Questionable chronic anemia versus long smoking history, marrow replacement disorders including multiple myeloma cannot be excluded.  Recommend further characterization with postcontrast MRI is recommended. 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  Multilevel moderate to severe neural foraminal stenosis worst at left L3, bilateral L4 and left L5. Patient was referred to oncology for further evaluation of abnormal MRI bone marrow signals.   Other than back pain, he reports feeling well.  Denies any weight loss, night sweating, fever or chills.  INTERVAL HISTORY Michael Vincent. is a 67 y.o. male who has above history reviewed by me today presents  for follow up visit for management of abnormal MRI results. During the interval patient has had lab work-up and also MRI spine with contrast. Patient and his wife present for discussion of results. He reports that his right-sided back pain has resolved after finished a course of steroids and gabapentin. No other concerns.  Review of Systems  Constitutional: Negative for chills, fever, malaise/fatigue and weight loss.  HENT: Negative for nosebleeds and sore throat.   Eyes: Negative for double vision, photophobia and redness.  Respiratory: Negative for cough, shortness of breath and wheezing.   Cardiovascular: Negative for chest pain, palpitations and orthopnea.  Gastrointestinal: Negative for abdominal pain, blood in stool, nausea and vomiting.  Genitourinary: Negative for dysuria.  Musculoskeletal: Negative for back pain, myalgias and neck pain.  Skin: Negative for itching and rash.  Neurological: Negative for dizziness, tingling and tremors.  Endo/Heme/Allergies: Negative for environmental allergies. Does not bruise/bleed easily.  Psychiatric/Behavioral: Negative for depression.    MEDICAL HISTORY:  Past Medical History:  Diagnosis Date  . High risk medication use 06/02/2016  . Hypertension 06/02/2016  . Prediabetes   . Rheumatoid arthritis involving multiple sites with positive rheumatoid factor ([place]) 06/02/2016    SURGICAL HISTORY: History reviewed. No pertinent surgical history.  SOCIAL HISTORY: Social History   Socioeconomic History  . Marital status: Married    Spouse name: Not on file  . Number of children: Not on file  . Years of education: Not on file  . Highest education level: Not on file  Occupational History  . Not on file  Social Needs  . Financial resource strain: Not on file  . Food insecurity:    Worry: Not on file    Inability: Not on file  . Transportation needs:  Medical: Not on file    Non-medical: Not on file  Tobacco Use  . Smoking status:  Former Smoker    Last attempt to quit: 03/15/1978    Years since quitting: 40.0  . Smokeless tobacco: Never Used  Substance and Sexual Activity  . Alcohol use: No    Frequency: Never  . Drug use: No  . Sexual activity: Not on file  Lifestyle  . Physical activity:    Days per week: Not on file    Minutes per session: Not on file  . Stress: Not on file  Relationships  . Social connections:    Talks on phone: Not on file    Gets together: Not on file    Attends religious service: Not on file    Active member of club or organization: Not on file    Attends meetings of clubs or organizations: Not on file    Relationship status: Not on file  . Intimate partner violence:    Fear of current or ex partner: Not on file    Emotionally abused: Not on file    Physically abused: Not on file    Forced sexual activity: Not on file  Other Topics Concern  . Not on file  Social History Narrative  . Not on file    FAMILY HISTORY: Family History  Problem Relation Age of Onset  . Heart disease Mother   . Diabetes Mother   . Kidney disease Father   . Diabetes Father   . Diabetes Daughter     ALLERGIES:  has No Known Allergies.  MEDICATIONS:  Current Outpatient Medications  Medication Sig Dispense Refill  . acetaminophen (TYLENOL) 500 MG tablet Take 1,000 mg by mouth every 4 (four) hours as needed.    Marland Kitchen amLODipine (NORVASC) 5 MG tablet Take 5 mg by mouth daily.     . CYANOCOBALAMIN IJ Inject as directed every 30 (thirty) days.     . diclofenac (VOLTAREN) 50 MG EC tablet Take 1 tablet (50 mg total) by mouth 2 (two) times daily. 60 tablet 0  . ENBREL SURECLICK 50 MG/ML injection INJECT 0.98 MLS (50 MG TOTAL) INTO THE SKIN EVERY 14 DAYS. 5.17 mL 0  . folic acid (FOLVITE) 1 MG tablet Take 1 mg by mouth daily.    Marland Kitchen gabapentin (NEURONTIN) 100 MG capsule Take 1 capsule (100 mg total) by mouth 3 (three) times daily. 90 capsule 0  . lisinopril (PRINIVIL,ZESTRIL) 40 MG tablet Take 40 mg by mouth  daily.    Marland Kitchen ketorolac (TORADOL) 10 MG tablet Take 1 tablet (10 mg total) by mouth every 8 (eight) hours. (Patient not taking: Reported on 03/15/2018) 15 tablet 0  . orphenadrine (NORFLEX) 100 MG tablet Take 1 tablet (100 mg total) by mouth 2 (two) times daily as needed for muscle spasms. (Patient not taking: Reported on 03/15/2018) 20 tablet 0   No current facility-administered medications for this visit.      PHYSICAL EXAMINATION: ECOG PERFORMANCE STATUS: 1 - Symptomatic but completely ambulatory Vitals:   03/29/18 1044  BP: 140/79  Pulse: 85  Resp: 18  Temp: (!) 97.1 F (36.2 C)   Filed Weights   03/29/18 1044  Weight: 217 lb 4.8 oz (98.6 kg)    Physical Exam  Constitutional: He is oriented to person, place, and time. He appears well-developed and well-nourished. No distress.  HENT:  Head: Normocephalic and atraumatic.  Right Ear: External ear normal.  Left Ear: External ear normal.  Mouth/Throat: Oropharynx is  clear and moist.  Eyes: Pupils are equal, round, and reactive to light. EOM are normal. No scleral icterus.  Neck: Normal range of motion. Neck supple.  Cardiovascular: Normal rate, regular rhythm and normal heart sounds.  Pulmonary/Chest: Effort normal. No respiratory distress. He has no wheezes.  Abdominal: Soft. Bowel sounds are normal. He exhibits no distension and no mass. There is no tenderness.  Musculoskeletal: Normal range of motion. He exhibits no edema or deformity.  Neurological: He is alert and oriented to person, place, and time. No cranial nerve deficit. Coordination normal.  Skin: Skin is warm and dry. No rash noted. No erythema.  Psychiatric: He has a normal mood and affect. His behavior is normal. Thought content normal.     LABORATORY DATA:  I have reviewed the data as listed Lab Results  Component Value Date   WBC 5.7 03/15/2018   HGB 12.9 (L) 03/15/2018   HCT 39.6 (L) 03/15/2018   MCV 81.9 03/15/2018   PLT 186 03/15/2018   Recent Labs     06/16/17 0908 11/29/17 0954 03/11/18 1044  NA 134* 140 135  K 4.0 4.5 4.1  CL 101 109 104  CO2 24 26 21   GLUCOSE 119* 114* 105*  BUN 15 18 19   CREATININE 1.09 1.27* 1.24  CALCIUM 9.1 9.7 9.5  GFRNONAA 72 59* 61  GFRAA 83 69 71  PROT 7.9 7.7 7.6  AST 21 22 46*  ALT 16 17 30   BILITOT 0.8 0.6 0.7   Iron/TIBC/Ferritin/ %Sat    Component Value Date/Time   IRON 108 03/15/2018 0943   TIBC 279 03/15/2018 0943   FERRITIN 493 (H) 03/15/2018 0943   IRONPCTSAT 39 03/15/2018 0943        ASSESSMENT & PLAN:  1. Abnormal MRI, spine   2. Chronic right-sided low back pain with right-sided sciatica    #We obtained MRI lumbar spine with contrast.  Image was independently reviewed and discussed with patient.  No abdominal enhancement in the lumbar spine.  No suspicious marrow lesion identified.  Suspect benign etiology of marrow heterogeneity. Labs reviewed also discussed with patient. Flow cytometry showed no significant immunophenotypic abnormality. SPEP did not show any monoclonal protein spike.  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 This is consistent with patient's autoimmune disorders, rheumatoid disorders.  Recommend patient to continue follow-up with Primary care physician and rheumatology for further management. Other work-up nonremarkable.  Discussed with patient. I think at this point patient can follow-up with PCP.  In the future if there is additional concerns or new findings I am happy to see them for follow-up. I would not schedule any follow-up at this point. All questions were answered. The patient knows to call the clinic with any problems questions or concerns.  Return of visit: As needed Total face to face encounter time for this patient visit was 25 min. >50% of the time was  spent in counseling and coordination of care.    Earlie Server, MD, PhD Hematology Oncology [facility] at  [facility] Pager- [phone] 03/29/2018

## 2018-03-29 NOTE — Progress Notes (Signed)
Patient here for follow up. No concerns voiced.  °

## 2018-04-04 ENCOUNTER — Ambulatory Visit: Admission: RE | Admit: 2018-04-04 | Payer: PPO | Source: Ambulatory Visit

## 2018-04-04 DIAGNOSIS — D519 Vitamin B12 deficiency anemia, unspecified: Secondary | ICD-10-CM | POA: Diagnosis not present

## 2018-05-02 ENCOUNTER — Ambulatory Visit: Payer: Medicare Other | Admitting: Physician Assistant

## 2018-05-10 DIAGNOSIS — D519 Vitamin B12 deficiency anemia, unspecified: Secondary | ICD-10-CM | POA: Diagnosis not present

## 2018-05-13 ENCOUNTER — Telehealth: Payer: Self-pay | Admitting: Pharmacist

## 2018-05-13 DIAGNOSIS — M0579 Rheumatoid arthritis with rheumatoid factor of multiple sites without organ or systems involvement: Secondary | ICD-10-CM

## 2018-05-13 NOTE — Telephone Encounter (Signed)
Called patient for quarterly Enbrel Follow-Up.  Left message to call back.

## 2018-05-27 MED ORDER — ETANERCEPT 50 MG/ML ~~LOC~~ SOAJ: SUBCUTANEOUS | 0 refills | Status: DC

## 2018-05-27 NOTE — Telephone Encounter (Signed)
[place] Follow up  Patient states that he is tolerating medication well without any side effects. Denies any swelling or stiffness and a states that Enbrel is such a "blessing".  Denies any new medications/allergies/medical conditions.  Reports he may sometimes is a day late taking his Enbrel dose.  Last office visit had a some back pain with concerning MRI and was referred to Dr. Tasia Catchings in oncology.  All results came back normal and patient is very thankful.  Reminded patient to come in next month to update lab work.  At last visit patient was instructed to inject every 3 weeks.  Will update prescription in Epic in order to coordinate next refills on time. Will follow up in three months.

## 2018-05-27 NOTE — Addendum Note (Signed)
Addended by: Mariella Saa C on: 05/27/2018 02:19 PM   Modules accepted: Orders

## 2018-06-06 ENCOUNTER — Other Ambulatory Visit: Payer: Self-pay | Admitting: Rheumatology

## 2018-06-06 NOTE — Telephone Encounter (Signed)
Last visit: 03/11/2018 Next visit: 08/16/2018 Labs: 03/11/2018  TB gold: 06/16/2017 negative   Okay to refill per Dr. Estanislado Pandy

## 2018-07-13 DIAGNOSIS — D519 Vitamin B12 deficiency anemia, unspecified: Secondary | ICD-10-CM | POA: Diagnosis not present

## 2018-07-13 DIAGNOSIS — Z0000 Encounter for general adult medical examination without abnormal findings: Secondary | ICD-10-CM | POA: Diagnosis not present

## 2018-07-13 DIAGNOSIS — E119 Type 2 diabetes mellitus without complications: Secondary | ICD-10-CM | POA: Diagnosis not present

## 2018-07-13 DIAGNOSIS — R944 Abnormal results of kidney function studies: Secondary | ICD-10-CM | POA: Diagnosis not present

## 2018-07-13 DIAGNOSIS — Z23 Encounter for immunization: Secondary | ICD-10-CM | POA: Diagnosis not present

## 2018-08-05 NOTE — Progress Notes (Signed)
Office Visit Note  Patient: Michael Hinton.             Date of Birth: 07-07-1951           MRN: [id number]             PCP: Cristy Folks, PA-C Referring: [place] Folk* Visit Date: 08/16/2018 Occupation: @GUAROCC @  Subjective:  Medication monitoring   History of Present Illness: Michael Capek. is a 67 y.o. male with history of seropositive rheumatoid arthritis and DDD.  He is on Enbrel 50 mg sq injections every 2 weeks.  He denies any recent flares of rheumatoid arthritis.  He denies any joint pain or joint swelling at this time.  He denies any morning stiffness.  He feels that Enbrel is effective even while spacing to every 2 weeks.  He denies any lower back pain at this time.  He denies any radiating pain or numbness.  He reports he tries to avoid red meat do to feeling like it worsening his inflammation.  He states he continues to have yearly skin exams.  He did not have the seasonal influenza vaccination.    Activities of Daily Living:  Patient reports morning stiffness for0 minutes.   Patient Denies nocturnal pain.  Difficulty dressing/grooming: Denies Difficulty climbing stairs: Denies Difficulty getting out of chair: Denies Difficulty using hands for taps, buttons, cutlery, and/or writing: Denies  Review of Systems  Constitutional: Negative for fatigue and night sweats.  HENT: Positive for mouth dryness. Negative for mouth sores, trouble swallowing, trouble swallowing and nose dryness.   Eyes: Negative for redness, visual disturbance and dryness.  Respiratory: Negative for cough, hemoptysis, shortness of breath and difficulty breathing.   Cardiovascular: Negative for chest pain, palpitations, hypertension, irregular heartbeat and swelling in legs/feet.  Gastrointestinal: Negative for blood in stool, constipation and diarrhea.  Endocrine: Negative for increased urination.  Genitourinary: Negative for painful urination.  Musculoskeletal:  Negative for arthralgias, joint pain, joint swelling, myalgias, muscle weakness, morning stiffness, muscle tenderness and myalgias.  Skin: Negative for color change, rash, hair loss, nodules/bumps, skin tightness, ulcers and sensitivity to sunlight.  Allergic/Immunologic: Negative for susceptible to infections.  Neurological: Negative for dizziness, fainting, memory loss, night sweats and weakness.  Hematological: Negative for swollen glands.  Psychiatric/Behavioral: Negative for depressed mood and sleep disturbance. The patient is not nervous/anxious.     PMFS History:  Patient Active Problem List   Diagnosis Date Noted  . DDD (degenerative disc disease), lumbar 03/11/2018  . ANA positive 01/22/2017  . 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 Rheumatoid arthritis involving multiple sites with positive rheumatoid factor ([place]) 06/02/2016  . High risk medication use 06/02/2016  . Hypertension 06/02/2016  . Vitamin D deficiency 06/02/2016    Past Medical History:  Diagnosis Date  . High risk medication use 06/02/2016  . Hypertension 06/02/2016  . Prediabetes   . Rheumatoid arthritis involving multiple sites with positive rheumatoid factor ([place]) 06/02/2016    Family History  Problem Relation Age of Onset  . Heart disease Mother   . Diabetes Mother   . Kidney disease Father   . Diabetes Father   . Diabetes Daughter    History reviewed. No pertinent surgical history. Social History   Social History Narrative  . Not on file    Objective: Vital Signs: BP 131/84 (BP Location: Left Arm, Patient Position: Sitting, Cuff Size: Large)   Pulse  67   Resp 12   Ht 5\' 11"  (1.803 m)   Wt 225 lb (102.1 kg)   BMI 31.38 kg/m    Physical Exam Vitals signs and nursing note reviewed.  Constitutional:      Appearance: He is well-developed.  HENT:     Head: Normocephalic and atraumatic.  Eyes:     Conjunctiva/sclera:  Conjunctivae normal.     Pupils: Pupils are equal, round, and reactive to light.  Neck:     Musculoskeletal: Normal range of motion and neck supple.  Cardiovascular:     Rate and Rhythm: Normal rate and regular rhythm.     Heart sounds: Normal heart sounds.  Pulmonary:     Effort: Pulmonary effort is normal.     Breath sounds: Normal breath sounds.  Abdominal:     General: Bowel sounds are normal.     Palpations: Abdomen is soft.  Lymphadenopathy:     Cervical: No cervical adenopathy.  Skin:    General: Skin is warm and dry.     Capillary Refill: Capillary refill takes less than 2 seconds.  Neurological:     Mental Status: He is alert and oriented to person, place, and time.  Psychiatric:        Behavior: Behavior normal.      Musculoskeletal Exam: C-spine, thoracic spine, and lumbar spine good ROM.  No midline spinal tenderness.  No SI joint tenderness.  Shoulder joints good ROM with no discomfort.  Chronic bilateral elbow joint contractures.  No tenderness or synovitis of elbow joints.  He has synovial thickening of bilateral wrist joints with limited ROM.  He has synovial thickening of all MCPs with ulnar deviation. Hip joints limited ROM with no discomfort.  Right knee limited flexion with discomfort.  Left knee full ROM.  No warmth or effusion of knee joints.  No tenderness or swelling of ankle joints.   CDAI Exam: CDAI Score: 0.2  Patient Global Assessment: 1 (mm); Provider Global Assessment: 1 (mm) Swollen: 0 ; Tender: 0  Joint Exam   Not documented   There is currently no information documented on the homunculus. Go to the Rheumatology activity and complete the homunculus joint exam.  Investigation: No additional findings.  Imaging: No results found.  Recent Labs: Lab Results  Component Value Date   WBC 5.7 03/15/2018   HGB 12.9 (L) 03/15/2018   PLT 186 03/15/2018   NA 135 03/11/2018   K 4.1 03/11/2018   CL 104 03/11/2018   CO2 21 03/11/2018   GLUCOSE 105  (H) 03/11/2018   BUN 19 03/11/2018   CREATININE 1.24 03/11/2018   BILITOT 0.7 03/11/2018   ALKPHOS 82 12/24/2016   AST 46 (H) 03/11/2018   ALT 30 03/11/2018   PROT 7.6 03/11/2018   ALBUMIN 3.5 (L) 12/24/2016   CALCIUM 9.5 03/11/2018   GFRAA 71 03/11/2018   QFTBGOLD NEGATIVE 06/16/2017    Speciality Comments: No specialty comments available.  Procedures:  No procedures performed Allergies: Patient has no known allergies.     Assessment / Plan:     Visit Diagnoses: Rheumatoid arthritis involving multiple sites with positive rheumatoid factor ([place]): He has no synovitis on exam.  He has not had any recent rheumatoid arthritis flares. He has chronic bilateral elbow joint contractures and synovial thickening of all MCPs and bilateral wrist joints. No nodulosis noted. He is clinically doing well on Enbrel 50 mg sq injections every 2 weeks.  He has no joint pain or joint swelling at this  time.  He has not had any morning stiffness.  He will continue on this current treatment regimen.  He was advised to notify us if he develops increased joint pain or joint swelling.  He will follow up in the office in 5 months.    High risk medication use -  Enbrel 50 mg sq q 2 wks - TB gold and CBC drawn today. He had CMP on 07/12/18 that was WNL. Standing orders for CBC and CMP were placed today.  He continues to have yearly skin exams. He did not receive the seasonal influenza vaccination. We discussed the immunosuppressive effects of Enbrel. - Plan: QuantiFERON-TB Gold Plus, COMPLETE METABOLIC PANEL WITH GFR, CBC with Differential/Platelet  Spinal stenosis of lumbar region without neurogenic claudication: He has no lower back pain at this time.  He has not symptoms of radiculopathy.   Radiculopathy, lumbar region: He has no symptoms of radiculopathy at this time.   DDD (degenerative disc disease), lumbar: He has good ROM with no discomfort of the lumbar spine at this time.  He has no midline spinal  tenderness or symptoms of radiculopathy at this time.   Contractures of both knees:  He has limited flexion of the right knee joint.  He has no warmth or effusion of knee joints.   Contracture of left elbow: Chronic.  No tenderness or synovitis noted.   Contracture of right elbow: Chronic.  No tenderness or synovitis noted.   ANA positive  Other drug-induced neutropenia ([place]): CBC was drawn today.   Vitamin D deficiency  Essential hypertension   Orders: Orders Placed This Encounter  Procedures  . QuantiFERON-TB Gold Plus  . COMPLETE METABOLIC PANEL WITH GFR  . CBC with Differential/Platelet  . CBC with Differential/Platelet   No orders of the defined types were placed in this encounter.   Follow-Up Instructions: Return in about 5 months (around 01/15/2019) for Rheumatoid arthritis, DDD.   Ofilia Neas, PA-C  Note - This record has been created using Dragon software.  Chart creation errors have been sought, but may not always  have been located. Such creation errors do not reflect on  the standard of medical care.

## 2018-08-15 DIAGNOSIS — I10 Essential (primary) hypertension: Secondary | ICD-10-CM | POA: Diagnosis not present

## 2018-08-15 DIAGNOSIS — E119 Type 2 diabetes mellitus without complications: Secondary | ICD-10-CM | POA: Diagnosis not present

## 2018-08-15 DIAGNOSIS — Z1211 Encounter for screening for malignant neoplasm of colon: Secondary | ICD-10-CM | POA: Diagnosis not present

## 2018-08-15 DIAGNOSIS — D519 Vitamin B12 deficiency anemia, unspecified: Secondary | ICD-10-CM | POA: Diagnosis not present

## 2018-08-15 DIAGNOSIS — M069 Rheumatoid arthritis, unspecified: Secondary | ICD-10-CM | POA: Diagnosis not present

## 2018-08-16 ENCOUNTER — Encounter: Payer: Self-pay | Admitting: Physician Assistant

## 2018-08-16 ENCOUNTER — Ambulatory Visit: Payer: PPO | Admitting: Physician Assistant

## 2018-08-16 VITALS — BP 131/84 | HR 67 | Resp 12 | Ht 71.0 in | Wt 225.0 lb

## 2018-08-16 DIAGNOSIS — M0579 Rheumatoid arthritis with rheumatoid factor of multiple sites without organ or systems involvement: Secondary | ICD-10-CM | POA: Diagnosis not present

## 2018-08-16 DIAGNOSIS — D702 Other drug-induced agranulocytosis: Secondary | ICD-10-CM

## 2018-08-16 DIAGNOSIS — Z79899 Other long term (current) drug therapy: Secondary | ICD-10-CM

## 2018-08-16 DIAGNOSIS — M24521 Contracture, right elbow: Secondary | ICD-10-CM | POA: Diagnosis not present

## 2018-08-16 DIAGNOSIS — I10 Essential (primary) hypertension: Secondary | ICD-10-CM

## 2018-08-16 DIAGNOSIS — M24561 Contracture, right knee: Secondary | ICD-10-CM | POA: Diagnosis not present

## 2018-08-16 DIAGNOSIS — M5416 Radiculopathy, lumbar region: Secondary | ICD-10-CM | POA: Diagnosis not present

## 2018-08-16 DIAGNOSIS — M24522 Contracture, left elbow: Secondary | ICD-10-CM

## 2018-08-16 DIAGNOSIS — M51369 Other intervertebral disc degeneration, lumbar region without mention of lumbar back pain or lower extremity pain: Secondary | ICD-10-CM

## 2018-08-16 DIAGNOSIS — M5136 Other intervertebral disc degeneration, lumbar region: Secondary | ICD-10-CM

## 2018-08-16 DIAGNOSIS — R768 Other specified abnormal immunological findings in serum: Secondary | ICD-10-CM

## 2018-08-16 DIAGNOSIS — M48061 Spinal stenosis, lumbar region without neurogenic claudication: Secondary | ICD-10-CM | POA: Diagnosis not present

## 2018-08-16 DIAGNOSIS — E559 Vitamin D deficiency, unspecified: Secondary | ICD-10-CM

## 2018-08-16 DIAGNOSIS — M24562 Contracture, left knee: Secondary | ICD-10-CM

## 2018-08-16 DIAGNOSIS — R7689 Other specified abnormal immunological findings in serum: Secondary | ICD-10-CM

## 2018-08-16 NOTE — Patient Instructions (Signed)
Standing Labs We placed an order today for your standing lab work.    Please come back and get your standing labs in April and every 3 months  We have open lab Monday through Friday from 8:30-11:30 AM and 1:30-4:00 PM  at the office of Dr. Shaili Deveshwar.   You may experience shorter wait times on Monday and Friday afternoons. The office is located at [address], [address], [place], [place] [address] No appointment is necessary.   Labs are drawn by [facility].  You may receive a bill from [facility] for your lab work.  If you wish to have your labs drawn at another location, please call the office 24 hours in advance to send orders.  If you have any questions regarding directions or hours of operation,  please call [phone].   Just as a reminder please drink plenty of water prior to coming for your lab work. Thanks!  

## 2018-08-17 NOTE — Telephone Encounter (Signed)
Received a fax form the [employer] foundation that patient's grant is expiring. Called and spoke to patient about applying to [place] that just opened. Patient said he would call to enroll. Provided phone number to patient.  Phone: [phone]  10:06 AM Beatriz Chancellor, CPhT

## 2018-08-17 NOTE — Progress Notes (Signed)
WBC count is mildly low.  Patient is spacing Enbrel to every 14 days. We will continue to monitor.

## 2018-08-18 ENCOUNTER — Other Ambulatory Visit: Payer: Self-pay | Admitting: Rheumatology

## 2018-08-18 LAB — CBC WITH DIFFERENTIAL/PLATELET
Absolute Monocytes: 417 cells/uL (ref 200–950)
Basophils Absolute: 28 cells/uL (ref 0–200)
Basophils Relative: 0.8 %
Eosinophils Absolute: 119 cells/uL (ref 15–500)
Eosinophils Relative: 3.4 %
HCT: 39.6 % (ref 38.5–50.0)
Hemoglobin: 13.2 g/dL (ref 13.2–17.1)
Lymphs Abs: 1148 cells/uL (ref 850–3900)
MCH: 27.1 pg (ref 27.0–33.0)
MCHC: 33.3 g/dL (ref 32.0–36.0)
MCV: 81.3 fL (ref 80.0–100.0)
MPV: 10.7 fL (ref 7.5–12.5)
Monocytes Relative: 11.9 %
Neutro Abs: 1789 cells/uL (ref 1500–7800)
Neutrophils Relative %: 51.1 %
PLATELETS: 193 10*3/uL (ref 140–400)
RBC: 4.87 10*6/uL (ref 4.20–5.80)
RDW: 13.7 % (ref 11.0–15.0)
TOTAL LYMPHOCYTE: 32.8 %
WBC: 3.5 10*3/uL — ABNORMAL LOW (ref 3.8–10.8)

## 2018-08-18 LAB — QUANTIFERON-TB GOLD PLUS
Mitogen-NIL: 1.05 IU/mL
NIL: 0.01 IU/mL
QUANTIFERON-TB GOLD PLUS: NEGATIVE
TB1-NIL: 0 IU/mL
TB2-NIL: 0 [IU]/mL

## 2018-08-18 NOTE — Telephone Encounter (Signed)
Last Visit: 08/16/18 Next Visit due June 2020. Message sent to the front to schedule patient. Labs: CMP on 07/12/18 that was WNL CBC WBC count is mildly low. Patient is spacing Enbrel to every 14 days. We will continue to monitor TB gold: 06/16/17 Neg Updated on 08/16/18 pending results  Okay to refill per Dr. Estanislado Pandy

## 2018-08-18 NOTE — Progress Notes (Signed)
TB gold negative

## 2018-09-05 ENCOUNTER — Telehealth: Payer: Self-pay | Admitting: Pharmacy Technician

## 2018-09-05 NOTE — Telephone Encounter (Signed)
Received message from pharmacy that patient's grant has expired and his copay is $1680. I spoke to patient, he did not enroll in grant when it was open. He will come by office to apply for Manufacturer assistance this week and pick up samples.

## 2018-09-06 NOTE — Telephone Encounter (Signed)
Submitted a tier exception to patient's plan for Enbrel Sureclick.

## 2018-09-06 NOTE — Telephone Encounter (Signed)
Patient came in to complete [employer] Application.  Medication Samples have been provided to the patient.  Drug name: Enbrel Sureclick       Strength: 50mg /ml        Qty: 2 boxes  LOT: [id number]  Exp.Date: 10/21  Dosing instructions: Inject 50mg  into skin every 14 days.  The patient has been instructed regarding the correct time, dose, and frequency of taking this medication, including desired effects and most common side effects.   Michael Hinton 10:45 AM 09/06/2018

## 2018-09-08 NOTE — Telephone Encounter (Signed)
Received a fax regarding Tier Exception from [place] for ENBREL. Authorization has been DENIED because specialty medication.  Will fax along with patient assistance application. Will send document to scan center.

## 2018-09-12 DIAGNOSIS — H2513 Age-related nuclear cataract, bilateral: Secondary | ICD-10-CM | POA: Diagnosis not present

## 2018-09-12 NOTE — Telephone Encounter (Signed)
Left voicemail for patient to return call to discuss patient assistance.

## 2018-09-12 NOTE — Telephone Encounter (Signed)
Received fax from [place] stating that the patient was denied for patient assistance.  He was denied due to being eligible for a low income subsidy called extra help.  If he has been previously denied from extra help we can resubmit with appropriate documentation.  Will send document to scan center and update patient.

## 2018-09-14 IMAGING — MR MR LUMBAR SPINE W/O CM
4 of 5 series · 18 of 48 positions shown · non-contrast
Comparison: Lumbar spine radiograph 03/03/2018

CLINICAL DATA: Low back pain with radiculopathy.

EXAM:
MRI LUMBAR SPINE WITHOUT CONTRAST
TECHNIQUE: Multiplanar, multisequence MR imaging of the lumbar spine was
performed. No intravenous contrast was administered.

[Series 3: T1 · sagittal · 4.0mm · 0.53mm/px · 3 of 12 slices shown (1 of 2)]
[im 3/12]
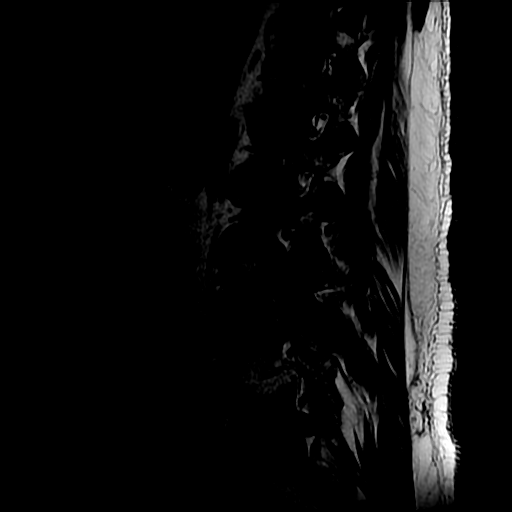
[im 7/12]
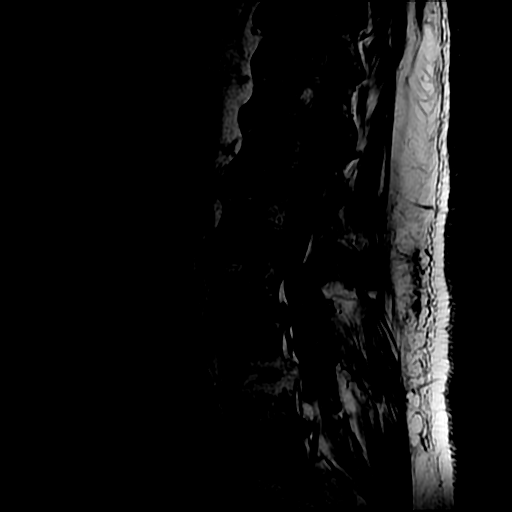
[im 12/12]
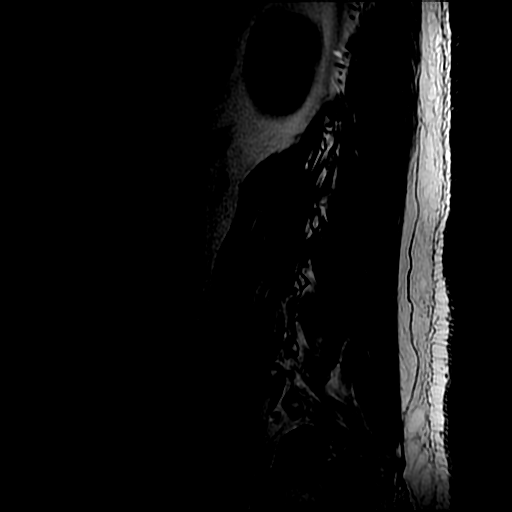

[Series 4: T2 post-contrast · sagittal · 4.0mm · 0.53mm/px · 5 of 12 slices shown]
[im 1/12]
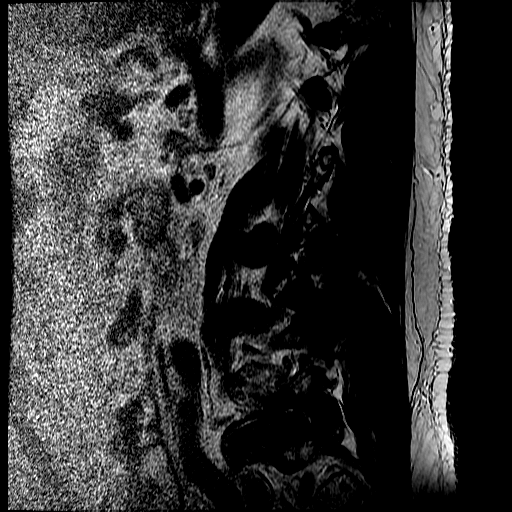
[im 3/12]
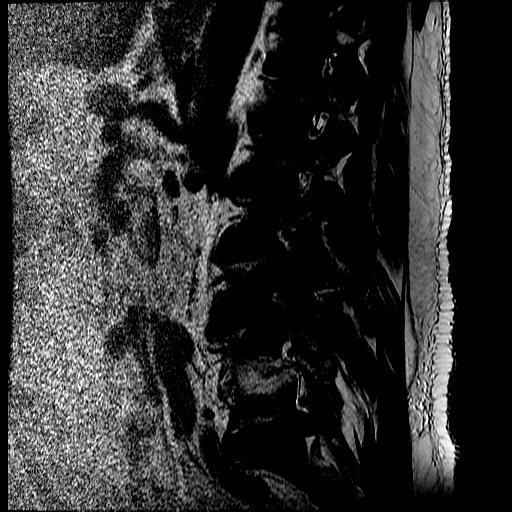
[im 6/12]
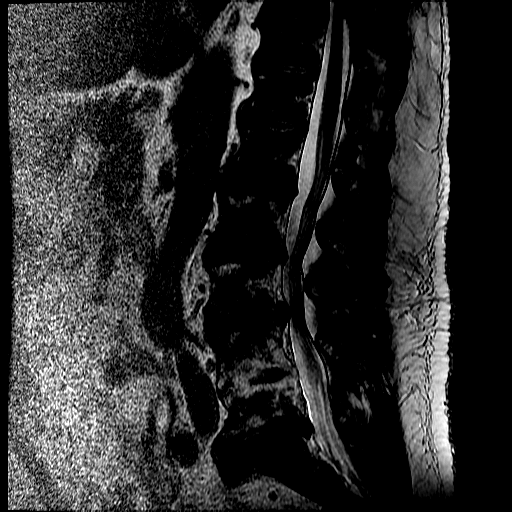
[im 9/12]
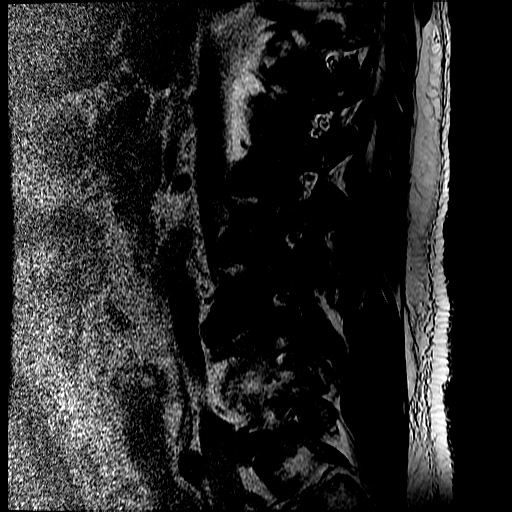
[im 12/12]
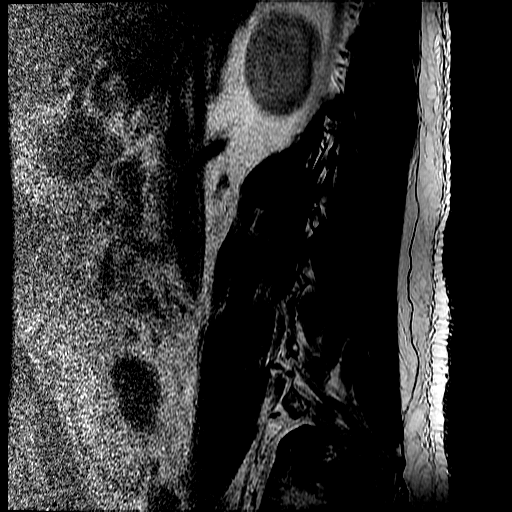

[Series 6: T2 · axial · 4.0mm · 0.39mm/px · z∈[-86,+86]mm · 7 of 38 slices shown]
[im 3/38]
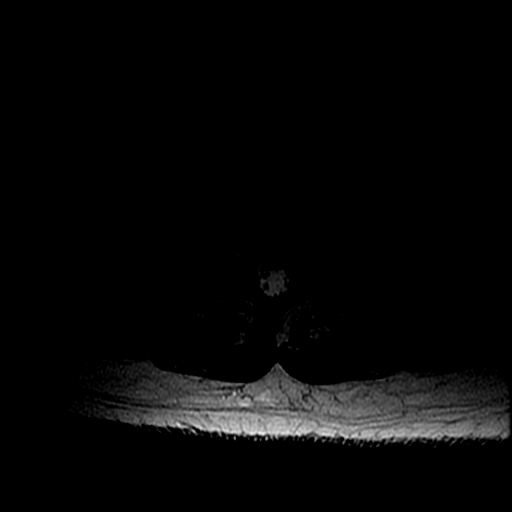
[im 5/38]
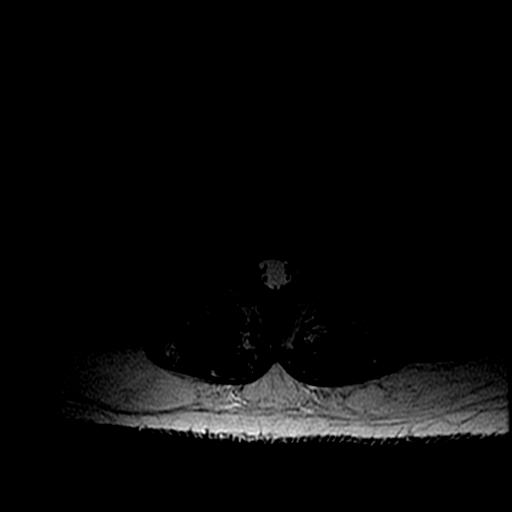
[im 8/38]
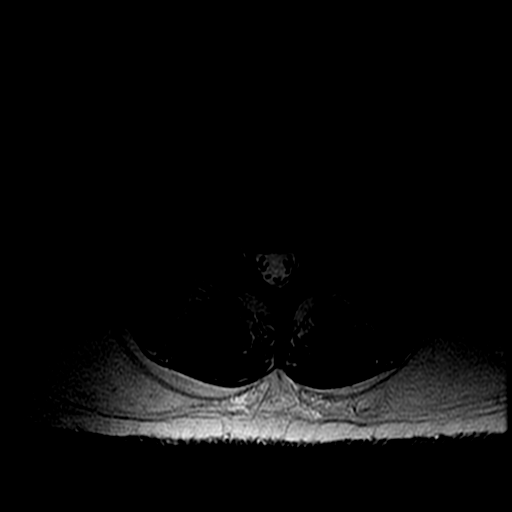
[im 13/38]
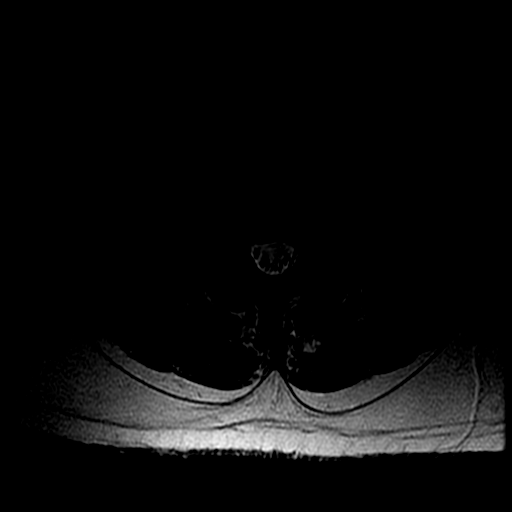
[im 18/38]
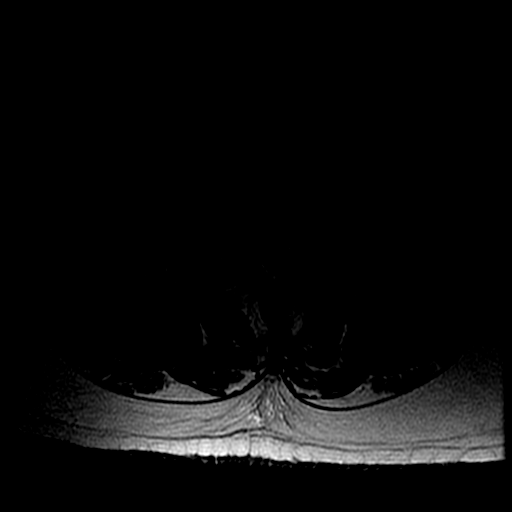
[im 20/38]
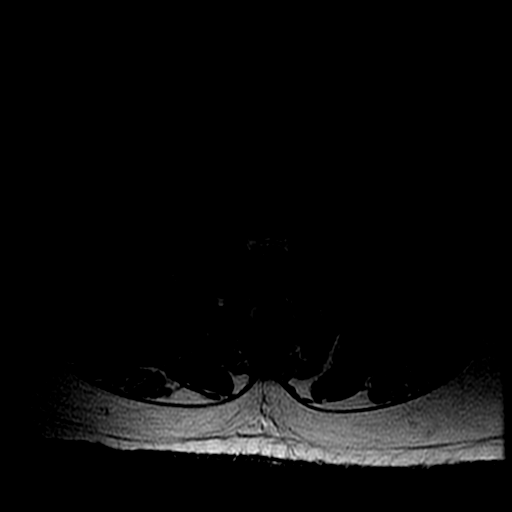
[im 33/38]
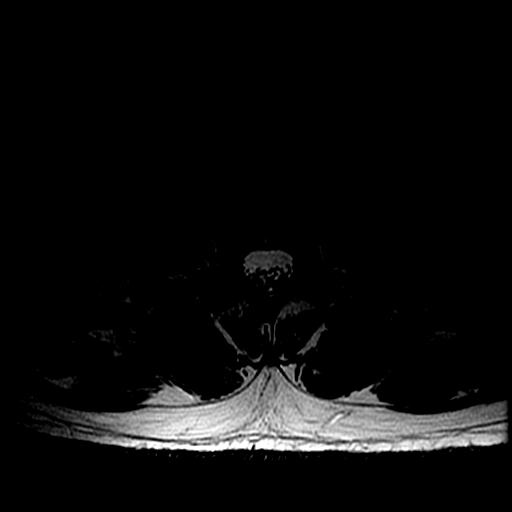

[Series 7: T1 · axial · 4.0mm · 0.39mm/px · z∈[-77,+86]mm · 3 of 38 slices shown (2 of 2)]
[im 5/38]
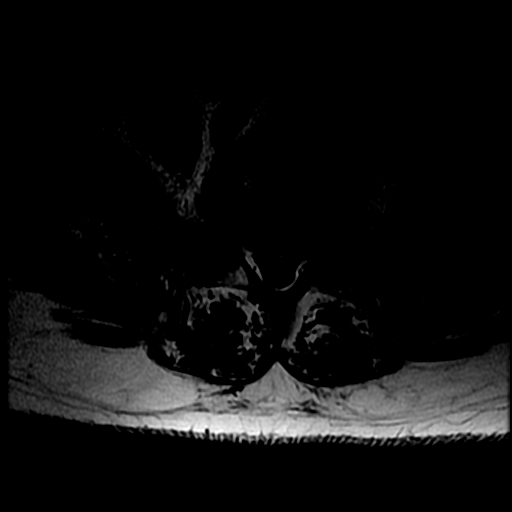
[im 20/38]
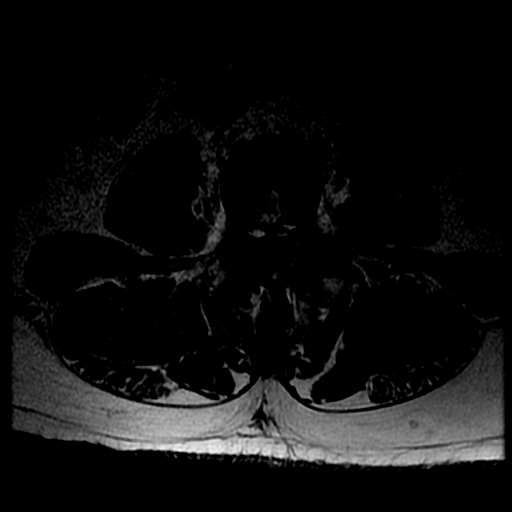
[im 33/38]
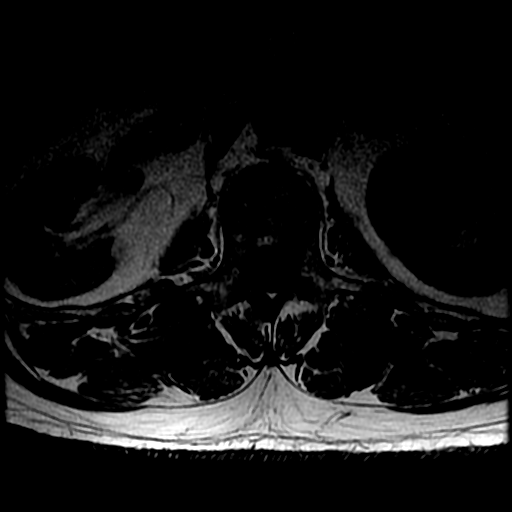

[18 of 48 positions shown; findings below may reference images not displayed]

FINDINGS: Segmentation:  Standard.

Alignment:  Physiologic.

Vertebrae: There is diffusely abnormal T1 and T2-weighted signal
throughout the vertebral bone marrow. There is no focal edema. No
compression fracture. No disc edema.

Conus medullaris and cauda equina: Conus extends to the L1 level.
Conus and cauda equina appear normal.

Paraspinal and other soft tissues: Negative

Disc levels:

The T10-T12 levels are imaged only in the sagittal plane and are
normal.

T12-L1: Normal.

L1-L2: Disc desiccation with minimal bulge. No spinal canal or
neural foraminal stenosis. Normal facets.

L2-L3: Intermediate disc bulge with normal facets. Mild bilateral
neural foraminal narrowing. Mild right-greater-than-left lateral
recess narrowing without central spinal canal stenosis.

L3-L4: Intermediate sized disc bulge with severe facet hypertrophy,
causing moderate spinal canal stenosis. There is moderate right and
severe left neural foraminal stenosis.

L4-L5: Partial fusion across the anterior aspect of the disc space.
Endplate ridging causes severe bilateral neural foraminal stenosis.
There is moderate facet hypertrophy. No spinal canal stenosis.

L5-S1: Disc space narrowing with broad bulge and endplate spurring.
Mild facet hypertrophy. No spinal canal stenosis. Mild right and
severe left neural foraminal stenosis.
IMPRESSION: 1. Diffusely abnormal bone marrow signal throughout the lumbar spine
at T1 and T2-weighted imaging. This may be due to chronic anemia or
a long smoking history; but marrow replacement disorders, including
multiple myeloma, may also have this appearance. The lack of edema
on the inversion recovery images is somewhat reassuring, but further
characterization with postcontrast MRI is recommended.
2. Moderate L3-4 spinal canal stenosis, multifactorial.
3. Multilevel moderate-to-severe neural foraminal stenosis, worst at
left L3, bilateral L4 and left L5.

## 2018-09-16 NOTE — Telephone Encounter (Signed)
Called to update patient about application.  Informed patient that he was denied due to being eligible for a low income subsidy called extra help through Medicare.  He is not familiar with the program and has not applied before.  Gave patient the number to call to discuss eligibility.  Informed patient that if he is denied through extra help [employer] will reconsider coverage for patient assistance.  Patient verbalized understanding and plans to call to look into the extra help program.  He will contact her office with updated information.  All questions encouraged and answered.  Instructed patient to call with any further questions or concerns.  Mariella Saa, PharmD, [facility] Rheumatology Clinical Pharmacist  09/16/2018 10:18 AM

## 2018-09-22 NOTE — Telephone Encounter (Signed)
Left message for patient to follow up 

## 2018-09-22 NOTE — Telephone Encounter (Signed)
Spoke to patient, he stated that he called Medicare Extra Help program and was advised that they were low on funds for new enrollments. Asked patient to call back to see if they can send him something in writing stating this, so we can submit to [place].

## 2018-09-29 NOTE — Telephone Encounter (Signed)
Called patient to follow up on wife's income documents for patient assistance application, patient said he forgot and will try to get the documents faxed tomorrow. Will follow up.

## 2018-10-03 DIAGNOSIS — D519 Vitamin B12 deficiency anemia, unspecified: Secondary | ICD-10-CM | POA: Diagnosis not present

## 2018-10-03 DIAGNOSIS — E119 Type 2 diabetes mellitus without complications: Secondary | ICD-10-CM | POA: Diagnosis not present

## 2018-10-07 NOTE — Telephone Encounter (Signed)
Called patient to follow up on documents, patient is still working on them.

## 2018-10-20 NOTE — Telephone Encounter (Signed)
Received fax from [place] that patient still appears eligible for Medicare LIS, and would need to apply before patient assistance can move forward. Called patient and gave him the information to call Medicare to apply.

## 2018-12-09 ENCOUNTER — Telehealth: Payer: Self-pay | Admitting: Pharmacist

## 2018-12-09 NOTE — Telephone Encounter (Signed)
Received a phone call from outpatient pharmacy that patient's copay was $1750.53 which he is unable to afford.  We have tried to enrol him into [employer] patient assistance program but he was not accepted as he is eligible for Medicare Extra Help.    Called patient to discuss.  Gave him the number for Medicare Extra Help again.  Instructed him that if he is no longer eligible we need a letter so we can reapply for [employer].  He has been without medication.  He states he has not noticed a difference while being off Enbrel.  Encouraged patient to call clinic to schedule a follow visit and obtain samples. Patient verbalized understanding.  All questions encouraged and answered.  Instructed patient to call with any further questions or concerns.  Mariella Saa, PharmD, [facility] Rheumatology Clinical Pharmacist  12/09/2018 4:02 PM

## 2018-12-10 NOTE — Telephone Encounter (Signed)
Thank you for informing me.   Marissa-Please call the patient to schedule a follow up visit.  We will provide samples for patient when he comes by the office.

## 2018-12-12 NOTE — Telephone Encounter (Signed)
Patient is scheduled for a virtual visit tomorrow 12/13/2018 with Dr.Deveshwar. patient is aware we will provide samples after his visit and arrange pick up.

## 2018-12-12 NOTE — Progress Notes (Signed)
Virtual Visit via Telephone Note  I connected with [name]. on 12/12/18 at  9:30 AM EDT by telephone and verified that I am speaking with the correct person using two identifiers.  Location: Patient: At home Provider: At the office    I discussed the limitations, risks, security and privacy concerns of performing an evaluation and management service by telephone and the availability of in person appointments. I also discussed with the patient that there may be a patient responsible charge related to this service. The patient expressed understanding and agreed to proceed. This service was conducted via virtual visit.  Patient unable to use webex, so we reached him by telephone. The patient was located at home. I was located in my office.  Consent was obtained prior to the virtual visit and is aware of possible charges through their insurance for this visit.  The patient is an established patient.  Dr. Estanislado Pandy, MD conducted the virtual visit and Hazel Sams, PA-C acted as scribe during the service.  Office staff helped with scheduling follow up visits after the service was conducted.   CC: Discuss medication   History of Present Illness: Patient is a 68 year old male with a past medical history of seropositive rheumatoid arthritis and DDD. He has not had any recent rheumatoid arthritis flares. He has no joint pain or joint swelling.   He has no morning stiffness.  He has no difficulty with ADLs. He has been out of Enbrel for about 1 month due to cost.   Review of Systems  Constitutional: Negative for fever and malaise/fatigue.  Eyes: Negative for photophobia, pain, discharge and redness.  Respiratory: Negative for cough, shortness of breath and wheezing.   Cardiovascular: Negative for chest pain and palpitations.  Gastrointestinal: Negative for blood in stool, constipation and diarrhea.  Genitourinary: Negative for dysuria.  Musculoskeletal: Negative for back pain, joint pain,  myalgias and neck pain.  Skin: Negative for rash.  Neurological: Negative for dizziness and headaches.  Psychiatric/Behavioral: Negative for depression. The patient is not nervous/anxious and does not have insomnia.       Observations/Objective: Physical Exam  Constitutional: He is oriented to person, place, and time.  Neurological: He is alert and oriented to person, place, and time.  Psychiatric: Mood, memory, affect and judgment normal.   Patient reports morning stiffness for 0 minutes.   Patient denies nocturnal pain.  Difficulty dressing/grooming: Denies Difficulty climbing stairs: Denies Difficulty getting out of chair: Denies Difficulty using hands for taps, buttons, cutlery, and/or writing: Denies  Assessment and Plan: Rheumatoid arthritis involving multiple sites with positive rheumatoid factor ([place]): He has not had any recent rheumatoid arthritis flares.  He has no joint pain or joint swelling.  He has no morning stiffness.  He has been out of Enbrel for about 1 month due to cost.  He is due to update lab work.  Once lab work is obtained, he will return for samples until Enbrel has been approved.  He was advised to notify us if he develops increased joint pain or joint swelling.  He will follow up in 3 months.    High risk medication use -  Enbrel 50 mg-CBC drawn on 08/16/18.  TB gold negative on 08/16/18.  Standing orders for CBC and CMP are in place.  He is due to update lab work.   Spinal stenosis of lumbar region without neurogenic claudication: He has no lower back pain or symptoms of radiculopathy at this time.   Radiculopathy,  lumbar region: He has no symptoms of radiculopathy at this time.   DDD (degenerative disc disease), lumbar: He has no lower back pain at this time.   Contractures of both knees:  He has no joint pain or joint swelling at this time.   Contracture of left elbow: Chronic.    Contracture of right elbow: Chronic.    ANA positive: He has no  other clinical features of autoimmune disease.   Other drug-induced neutropenia [facility]):  He is due to update lab work.  Standing orders are in place.     Follow Up Instructions: He will follow up in 3 months.  He is due to update lab work.  We will provide Enbrel samples.   I discussed the assessment and treatment plan with the patient. The patient was provided an opportunity to ask questions and all were answered. The patient agreed with the plan and demonstrated an understanding of the instructions.   The patient was advised to call back or seek an in-person evaluation if the symptoms worsen or if the condition fails to improve as anticipated.  I provided 25 minutes of non-face-to-face time during this encounter. Bo Merino, MD   Scribed by-  Ofilia Neas, PA-C

## 2018-12-13 ENCOUNTER — Telehealth (INDEPENDENT_AMBULATORY_CARE_PROVIDER_SITE_OTHER): Payer: Medicare PPO | Admitting: Rheumatology

## 2018-12-13 ENCOUNTER — Encounter: Payer: Self-pay | Admitting: Rheumatology

## 2018-12-13 ENCOUNTER — Other Ambulatory Visit: Payer: Self-pay

## 2018-12-13 ENCOUNTER — Telehealth: Payer: Self-pay

## 2018-12-13 DIAGNOSIS — M5136 Other intervertebral disc degeneration, lumbar region: Secondary | ICD-10-CM

## 2018-12-13 DIAGNOSIS — M51369 Other intervertebral disc degeneration, lumbar region without mention of lumbar back pain or lower extremity pain: Secondary | ICD-10-CM

## 2018-12-13 DIAGNOSIS — M24561 Contracture, right knee: Secondary | ICD-10-CM

## 2018-12-13 DIAGNOSIS — M24521 Contracture, right elbow: Secondary | ICD-10-CM

## 2018-12-13 DIAGNOSIS — Z79899 Other long term (current) drug therapy: Secondary | ICD-10-CM

## 2018-12-13 DIAGNOSIS — M0579 Rheumatoid arthritis with rheumatoid factor of multiple sites without organ or systems involvement: Secondary | ICD-10-CM | POA: Diagnosis not present

## 2018-12-13 DIAGNOSIS — D702 Other drug-induced agranulocytosis: Secondary | ICD-10-CM

## 2018-12-13 DIAGNOSIS — M24522 Contracture, left elbow: Secondary | ICD-10-CM

## 2018-12-13 DIAGNOSIS — M48061 Spinal stenosis, lumbar region without neurogenic claudication: Secondary | ICD-10-CM

## 2018-12-13 DIAGNOSIS — I10 Essential (primary) hypertension: Secondary | ICD-10-CM

## 2018-12-13 DIAGNOSIS — R7689 Other specified abnormal immunological findings in serum: Secondary | ICD-10-CM

## 2018-12-13 DIAGNOSIS — M24562 Contracture, left knee: Secondary | ICD-10-CM

## 2018-12-13 DIAGNOSIS — R768 Other specified abnormal immunological findings in serum: Secondary | ICD-10-CM

## 2018-12-13 DIAGNOSIS — E559 Vitamin D deficiency, unspecified: Secondary | ICD-10-CM

## 2018-12-13 NOTE — Telephone Encounter (Signed)
Called patient to offer samples of enbrel (he had virtual appointment today 12/13/2018). Patient states his wife will pick them up on Thursday or Friday but will call us prior to her coming.

## 2018-12-14 NOTE — Telephone Encounter (Signed)
Patient's wife will be coming by the office tomorrow 12/15/2018 to pick up samples.

## 2018-12-15 ENCOUNTER — Other Ambulatory Visit: Payer: Self-pay | Admitting: *Deleted

## 2018-12-23 ENCOUNTER — Telehealth: Payer: Self-pay | Admitting: Rheumatology

## 2018-12-23 NOTE — Telephone Encounter (Signed)
LMOM for patient to call and schedule 3 month follow-up appointment. °

## 2018-12-23 NOTE — Telephone Encounter (Signed)
-----   Message from [place] sent at 12/13/2018  2:05 PM EDT ----- Patient had virtual visit today with Dr. Estanislado Pandy. Please call to schedule 3 month follow up. Thanks!

## 2019-01-16 ENCOUNTER — Telehealth: Payer: Self-pay | Admitting: Rheumatology

## 2019-01-16 DIAGNOSIS — Z79899 Other long term (current) drug therapy: Secondary | ICD-10-CM

## 2019-01-16 NOTE — Telephone Encounter (Signed)
Patient left a voicemail stating he has questions regarding his prescription of Enbrel. Patient requested a return call.

## 2019-01-16 NOTE — Telephone Encounter (Signed)
LVM

## 2019-01-18 ENCOUNTER — Other Ambulatory Visit: Payer: Self-pay

## 2019-01-18 DIAGNOSIS — Z79899 Other long term (current) drug therapy: Secondary | ICD-10-CM

## 2019-01-19 LAB — CBC WITH DIFFERENTIAL/PLATELET
Absolute Monocytes: 412 cells/uL (ref 200–950)
Basophils Absolute: 20 cells/uL (ref 0–200)
Basophils Relative: 0.7 %
Eosinophils Absolute: 171 cells/uL (ref 15–500)
Eosinophils Relative: 5.9 %
HCT: 35.5 % — ABNORMAL LOW (ref 38.5–50.0)
Hemoglobin: 11.3 g/dL — ABNORMAL LOW (ref 13.2–17.1)
Lymphs Abs: 934 cells/uL (ref 850–3900)
MCH: 25.9 pg — ABNORMAL LOW (ref 27.0–33.0)
MCHC: 31.8 g/dL — ABNORMAL LOW (ref 32.0–36.0)
MCV: 81.4 fL (ref 80.0–100.0)
MPV: 10.5 fL (ref 7.5–12.5)
Monocytes Relative: 14.2 %
Neutro Abs: 1363 cells/uL — ABNORMAL LOW (ref 1500–7800)
Neutrophils Relative %: 47 %
Platelets: 177 10*3/uL (ref 140–400)
RBC: 4.36 10*6/uL (ref 4.20–5.80)
RDW: 15 % (ref 11.0–15.0)
Total Lymphocyte: 32.2 %
WBC: 2.9 10*3/uL — ABNORMAL LOW (ref 3.8–10.8)

## 2019-01-19 LAB — COMPLETE METABOLIC PANEL WITH GFR
AG Ratio: 1 (calc) (ref 1.0–2.5)
ALT: 17 U/L (ref 9–46)
AST: 18 U/L (ref 10–35)
Albumin: 3.6 g/dL (ref 3.6–5.1)
Alkaline phosphatase (APISO): 100 U/L (ref 35–144)
BUN: 15 mg/dL (ref 7–25)
CO2: 24 mmol/L (ref 20–32)
Calcium: 8.9 mg/dL (ref 8.6–10.3)
Chloride: 107 mmol/L (ref 98–110)
Creat: 1.1 mg/dL (ref 0.70–1.25)
GFR, Est African American: 81 mL/min/{1.73_m2} (ref >=60)
GFR, Est Non African American: 70 mL/min/{1.73_m2} (ref >=60)
Globulin: 3.5 g/dL (calc) (ref 1.9–3.7)
Glucose, Bld: 96 mg/dL (ref 65–99)
Potassium: 4.2 mmol/L (ref 3.5–5.3)
Sodium: 139 mmol/L (ref 135–146)
Total Bilirubin: 0.7 mg/dL (ref 0.2–1.2)
Total Protein: 7.1 g/dL (ref 6.1–8.1)

## 2019-01-19 NOTE — Telephone Encounter (Signed)
Spoke with patient who states he was not approved for Medicare Extra Help.  He was told verbally but was not given a letter.  Informed patient that we would not be able to continue to give him a sample every 14 days.  If he is able to obtain a letter of denial we can re-apply to [employer].  Patient verbalized understanding.   He will try to get a document showing his denial.  All questions encouraged and answered.  Instructed patient to call with any further questions or concerns.  Mariella Saa, PharmD, [facility] Clinical Pharmacist  01/19/2019 1:11 PM

## 2019-01-19 NOTE — Progress Notes (Signed)
WBC count is low-2.9.  Hgb and Hct are low.  Please advise patient to continue to space Enbrel to every 14 days.  Dr. Estanislado Pandy would like the patient to repeat CBC in 1 month.  CMP WNL.

## 2019-02-15 NOTE — Telephone Encounter (Signed)
Placed patient on a Rheumatology grant waiting list through [employer]. Once the grant opens (depending on capacity- if selected), patient will be notified and will have 48 hours to accept.   TAF Pin# [id number]- to check status and complete application  Phone# [phone]  3:31 PM Beatriz Chancellor, CPhT

## 2019-03-03 NOTE — Telephone Encounter (Signed)
Applied for a grant for:  Patient: Michael Hinton: Rheumatoid Arthritis Award Period: - 03/02/2020 Total- $6,000  Cardholder: [id number] BIN: [id number] PCN: [id number] Group: [id number]  For pharmacy inquiries, contact PDMI at [phone]. For patient inquiries, contact PAF at [phone].  Application was instantly approved. Faxed chart notes, once processed grant is locked in for 1 year.  Will follow up to confirm receipt. Left message for patient to advise. Added grant info in pharmacy system and will reactivate patient in [place].  2:51 PM Beatriz Chancellor, CPhT

## 2019-03-16 ENCOUNTER — Telehealth: Payer: Self-pay | Admitting: Pharmacist

## 2019-03-16 NOTE — Telephone Encounter (Signed)
Received notification from [facility] regarding a prior authorization for ENBREL. Authorization has been APPROVED from 08/10/18 to 08/10/19.   Will send document to scan center.  Authorization # [id number] Phone # [phone]  8:20 AM Beatriz Chancellor, CPhT

## 2019-03-16 NOTE — Telephone Encounter (Signed)
Received notification from [facility] regarding a prior authorization for ENBREL. Authorization has been APPROVED from 03/16/2019 to 08/10/2019.   Will send document to scan center.  Phone # [phone]

## 2019-08-18 NOTE — Telephone Encounter (Signed)
Called [employer] to renew Enbrel Prior Auth. Rep advised PA had been extended through 08/10/2020  PA# [id number]  Phone# [phone]  8:57 AM Beatriz Chancellor, CPhT

## 2019-08-28 ENCOUNTER — Other Ambulatory Visit: Payer: Self-pay | Admitting: Rheumatology

## 2019-08-31 ENCOUNTER — Other Ambulatory Visit: Payer: Self-pay

## 2019-08-31 DIAGNOSIS — Z79899 Other long term (current) drug therapy: Secondary | ICD-10-CM

## 2019-08-31 DIAGNOSIS — M0579 Rheumatoid arthritis with rheumatoid factor of multiple sites without organ or systems involvement: Secondary | ICD-10-CM

## 2019-09-01 LAB — CBC WITH DIFFERENTIAL/PLATELET
Absolute Monocytes: 511 cells/uL (ref 200–950)
Basophils Absolute: 19 cells/uL (ref 0–200)
Basophils Relative: 0.5 %
Eosinophils Absolute: 159 cells/uL (ref 15–500)
Eosinophils Relative: 4.3 %
HCT: 35.5 % — ABNORMAL LOW (ref 38.5–50.0)
Hemoglobin: 11.5 g/dL — ABNORMAL LOW (ref 13.2–17.1)
Lymphs Abs: 958 cells/uL (ref 850–3900)
MCH: 26.2 pg — ABNORMAL LOW (ref 27.0–33.0)
MCHC: 32.4 g/dL (ref 32.0–36.0)
MCV: 80.9 fL (ref 80.0–100.0)
MPV: 11.2 fL (ref 7.5–12.5)
Monocytes Relative: 13.8 %
Neutro Abs: 2054 cells/uL (ref 1500–7800)
Neutrophils Relative %: 55.5 %
Platelets: 175 10*3/uL (ref 140–400)
RBC: 4.39 10*6/uL (ref 4.20–5.80)
RDW: 14.7 % (ref 11.0–15.0)
Total Lymphocyte: 25.9 %
WBC: 3.7 10*3/uL — ABNORMAL LOW (ref 3.8–10.8)

## 2019-09-01 LAB — COMPLETE METABOLIC PANEL WITH GFR
AG Ratio: 1 (calc) (ref 1.0–2.5)
ALT: 19 U/L (ref 9–46)
AST: 24 U/L (ref 10–35)
Albumin: 3.7 g/dL (ref 3.6–5.1)
Alkaline phosphatase (APISO): 104 U/L (ref 35–144)
BUN/Creatinine Ratio: 15 (calc) (ref 6–22)
BUN: 19 mg/dL (ref 7–25)
CO2: 26 mmol/L (ref 20–32)
Calcium: 9.3 mg/dL (ref 8.6–10.3)
Chloride: 107 mmol/L (ref 98–110)
Creat: 1.26 mg/dL — ABNORMAL HIGH (ref 0.70–1.25)
GFR, Est African American: 68 mL/min/{1.73_m2} (ref >=60)
GFR, Est Non African American: 59 mL/min/{1.73_m2} — ABNORMAL LOW (ref >=60)
Globulin: 3.6 g/dL (calc) (ref 1.9–3.7)
Glucose, Bld: 89 mg/dL (ref 65–99)
Potassium: 4.3 mmol/L (ref 3.5–5.3)
Sodium: 140 mmol/L (ref 135–146)
Total Bilirubin: 0.6 mg/dL (ref 0.2–1.2)
Total Protein: 7.3 g/dL (ref 6.1–8.1)

## 2019-09-01 NOTE — Progress Notes (Signed)
WBC count is borderline low but has trended up.  Hgb and Hct are low but improving as well.   Creatinine is elevated-1.26. GFR is WNL-68.  Please notify patient and advise patient to avoid use of NSAIDS.  Please forward to PCP.

## 2019-09-19 MED ORDER — ENBREL SURECLICK 50 MG/ML ~~LOC~~ SOAJ: SUBCUTANEOUS | 0 refills | Status: DC

## 2019-09-19 NOTE — Addendum Note (Signed)
Addended by: Mariella Saa C on: 09/19/2019 01:19 PM   Modules accepted: Orders

## 2019-09-19 NOTE — Progress Notes (Addendum)
Patient is on Enbrel and refill was denied on 08/28/2019 due to needing labs and appointment.  Patient has updated labs but still does not have appointment scheduled.  Okay to send in 1 month supply.  Patient will need to schedule an appointment prior to any refills.

## 2019-10-19 ENCOUNTER — Other Ambulatory Visit: Payer: Self-pay | Admitting: Physician Assistant

## 2019-10-19 DIAGNOSIS — M0579 Rheumatoid arthritis with rheumatoid factor of multiple sites without organ or systems involvement: Secondary | ICD-10-CM

## 2019-10-19 NOTE — Telephone Encounter (Signed)
Ok to refill 30 day supply only.

## 2019-10-19 NOTE — Telephone Encounter (Signed)
Last Visit: 12/13/18 Next Visit: 10/23/19 Labs: 08/31/19 WBC count is borderline low but has trended up. Hgb and Hct are low but improving as well.  Creatinine is elevated-1.26. GFR is WNL-68 TB Gold:  08/16/18 Neg  Patient will update TB Gold at upcoming appointment.   Okay to refill 30 day supply Enbrel?

## 2019-10-26 NOTE — Progress Notes (Signed)
Office Visit Note  Patient: Michael Hinton.             Date of Birth: Nov 28, 1950           MRN: [id number]             PCP: Cristy Folks, PA-C Referring: [place] Folk* Visit Date: 11/06/2019 Occupation: @GUAROCC @  Subjective:  Medication monitoring   History of Present Illness: Michael Hinton. is a 69 y.o. male with history of seropositive rheumatoid arthritis and DDD.  He is on Enbrel 50 mg sq injections every 14 days.  He is tolerating enbrel without any side effects or injection site reactions.  He denies any recent rheumatoid arthritis flares.  He denies any joint pain or joint swelling at this time.  He denies any increased morning stiffness or nocturnal pain.  He denies any recent infections.  He has received his first COVID-19 vaccination and is scheduled for a second on 11/13/2019.     Activities of Daily Living:  Patient reports morning stiffness for 1 hour.   Patient Denies nocturnal pain.  Difficulty dressing/grooming: Denies Difficulty climbing stairs: Denies Difficulty getting out of chair: Denies Difficulty using hands for taps, buttons, cutlery, and/or writing: Denies  Review of Systems  Constitutional: Negative for fatigue and night sweats.  HENT: Negative for mouth sores, mouth dryness and nose dryness.   Eyes: Negative for redness and dryness.  Respiratory: Negative for cough, shortness of breath and difficulty breathing.   Cardiovascular: Negative for chest pain, palpitations, hypertension, irregular heartbeat and swelling in legs/feet.  Gastrointestinal: Negative for blood in stool, constipation and diarrhea.  Genitourinary: Negative for difficulty urinating and painful urination.  Musculoskeletal: Positive for morning stiffness. Negative for arthralgias, joint pain, joint swelling, myalgias, muscle weakness, muscle tenderness and myalgias.  Skin: Negative for color change, rash, hair loss, nodules/bumps, skin tightness, ulcers  and sensitivity to sunlight.  Allergic/Immunologic: Negative for susceptible to infections.  Neurological: Negative for dizziness, fainting, numbness, memory loss, night sweats and weakness.  Hematological: Negative for bruising/bleeding tendency and swollen glands.  Psychiatric/Behavioral: Negative for depressed mood and sleep disturbance. The patient is not nervous/anxious.     PMFS History:  Patient Active Problem List   Diagnosis Date Noted  . DDD (degenerative disc disease), lumbar 03/11/2018  . ANA positive 01/22/2017  . 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 Rheumatoid arthritis involving multiple sites with positive rheumatoid factor ([place]) 06/02/2016  . High risk medication use 06/02/2016  . Hypertension 06/02/2016  . Vitamin D deficiency 06/02/2016    Past Medical History:  Diagnosis Date  . High risk medication use 06/02/2016  . Hypertension 06/02/2016  . Prediabetes   . Rheumatoid arthritis involving multiple sites with positive rheumatoid factor ([place]) 06/02/2016    Family History  Problem Relation Age of Onset  . Heart disease Mother   . Diabetes Mother   . Kidney disease Father   . Diabetes Father   . Diabetes Daughter    History reviewed. No pertinent surgical history. Social History   Social History Narrative  . Not on file    There is no immunization history on file for this patient.   Objective: Vital Signs: BP (!) 150/85 (BP Location: Left Arm, Patient Position: Sitting, Cuff Size: Normal)   Pulse 70   Resp 18   Ht 5\' 11"  (1.803 m)   Wt 211 lb (95.7 kg)  BMI 29.43 kg/m    Physical Exam Vitals and nursing note reviewed.  Constitutional:      Appearance: He is well-developed.  HENT:     Head: Normocephalic and atraumatic.  Eyes:     Conjunctiva/sclera: Conjunctivae normal.     Pupils: Pupils are equal, round, and reactive to light.  Pulmonary:     Effort: Pulmonary effort is  normal.  Abdominal:     General: Bowel sounds are normal.     Palpations: Abdomen is soft.  Musculoskeletal:     Cervical back: Normal range of motion and neck supple.  Skin:    General: Skin is warm and dry.     Capillary Refill: Capillary refill takes less than 2 seconds.  Neurological:     Mental Status: He is alert and oriented to person, place, and time.  Psychiatric:        Behavior: Behavior normal.      Musculoskeletal Exam: C-spine limited ROM. Thoracic kyphosis.  Shoulder joints limited ROM bilaterally.  Contractures in both elbows.  Limited ROM of both wrist joints. Synovial thickening of bilateral 1st and 2nd MCPs.  Ulnar deviation bilaterally. Left 3rd PIP joint contracture. Limited ROM of both hip joints.  Limited flexion and extension and valgus deformity of right knee.  Left knee joint has good ROM with no discomfort.  No warmth or effusion of knee joints.  Ankle joints good ROM with no synovitis.   CDAI Exam: CDAI Score: 0.4  Patient Global: 2 mm; Provider Global: 2 mm Swollen: 0 ; Tender: 0  Joint Exam 11/06/2019   No joint exam has been documented for this visit   There is currently no information documented on the homunculus. Go to the Rheumatology activity and complete the homunculus joint exam.  Investigation: No additional findings.  Imaging: No results found.  Recent Labs: Lab Results  Component Value Date   WBC 3.7 (L) 08/31/2019   HGB 11.5 (L) 08/31/2019   PLT 175 08/31/2019   NA 140 08/31/2019   K 4.3 08/31/2019   CL 107 08/31/2019   CO2 26 08/31/2019   GLUCOSE 89 08/31/2019   BUN 19 08/31/2019   CREATININE 1.26 (H) 08/31/2019   BILITOT 0.6 08/31/2019   ALKPHOS 82 12/24/2016   AST 24 08/31/2019   ALT 19 08/31/2019   PROT 7.3 08/31/2019   ALBUMIN 3.5 (L) 12/24/2016   CALCIUM 9.3 08/31/2019   GFRAA 68 08/31/2019   QFTBGOLD NEGATIVE 06/16/2017   QFTBGOLDPLUS NEGATIVE 08/16/2018    Speciality Comments: No specialty comments available.   Procedures:  No procedures performed Allergies: Patient has no known allergies.   Assessment / Plan:     Visit Diagnoses: Rheumatoid arthritis involving multiple sites with positive rheumatoid factor ([place]): He has no synovitis on exam today.  He has not had any recent rheumatoid arthritis flares.  He is clinically doing well on Enbrel 50 mg subcu days injections every 14 days.  He continues to have morning stiffness lasting 1 hour.  He has not had any increase stiffness or nocturnal pain.  He has limited range of motion of both shoulder joints, bilateral elbow joint contractures, and limited range of motion of both wrist joints.  Synovial thickening of bilateral first and second MCP joints noted.  He has ulnar deviation bilaterally.  Right knee joint has limited flexion and extension as well as valgus deformity.  He will continue on Enbrel as prescribed.  He does not need any refills at this time.  He was  advised to notify us if he develops increased joint pain or joint swelling.  He will follow-up in the office in 5 months.  High risk medication use - Enbrel 50 mg every 14 days.  CBC and CMP were stable on 08/31/2019.  He will return for lab work in 3 weeks.  Standing orders are in place.  TB gold was negative on 08/16/2018.  TB gold will be updated today.  He has not had any recent infections.  We discussed the importance of holding Enbrel if he develops any signs or symptoms of an infection and to resume once the infection is completely cleared.  The patient has received his first COVID-19 vaccination and is scheduled to receive a second on 11/13/2019.- Plan: QuantiFERON-TB Gold Plus  Spinal stenosis of lumbar region without neurogenic claudication: He has intermittent discomfort in his lower back.  No symptoms of radiculopathy.   Radiculopathy, lumbar region: He has intermittent discomfort in his lumbar spine.  No midline spinal tenderness.  DDD (degenerative disc disease), lumbar: He experiences  intermittent discomfort in his lower back.  He states that he plays pool on a daily basis and sometimes will have increased discomfort.  He is not having any symptoms of radiculopathy.  Contracture of left elbow: Chronic, unchanged. No tenderness or inflammation noted.   Contracture of right elbow: Chronic, unchanged.  No tenderness or inflammation noted.   Contractures of both knees: He has limited flexion and extension of the right knee.  Right knee valgus deformity noted.  No warmth or effusion was noted.  Left knee has good range of motion with no discomfort.  ANA positive: He has no other clinical features of autoimmune disease.   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  We will continue to monitor closely.  He will return for lab work in 3 weeks.   High serum vitamin B12 - He gives a history of vitamin D deficiency.  He states that he was receiving injections and then was transitioned to oral vitamin B12.  According to the patient his vitamin B12 level was elevated and he discontinued the supplement.  He would like to recheck his vitamin B12 level today.  Plan: Vitamin B12  Orders: Orders Placed This Encounter  Procedures  . QuantiFERON-TB Gold Plus  . Vitamin B12   No orders of the defined types were placed in this encounter.     Follow-Up Instructions: Return in about 5 months (around 04/07/2020) for Rheumatoid arthritis, DDD.   Ofilia Neas, PA-C  Note - This record has been created using Dragon software.  Chart creation errors have been sought, but may not always  have been located. Such creation errors do not reflect on  the standard of medical care.

## 2019-11-06 ENCOUNTER — Ambulatory Visit: Payer: Medicare PPO | Admitting: Physician Assistant

## 2019-11-06 ENCOUNTER — Encounter: Payer: Self-pay | Admitting: Physician Assistant

## 2019-11-06 ENCOUNTER — Other Ambulatory Visit: Payer: Self-pay

## 2019-11-06 VITALS — BP 150/85 | HR 70 | Resp 18 | Ht 71.0 in | Wt 211.0 lb

## 2019-11-06 DIAGNOSIS — M0579 Rheumatoid arthritis with rheumatoid factor of multiple sites without organ or systems involvement: Secondary | ICD-10-CM | POA: Diagnosis not present

## 2019-11-06 DIAGNOSIS — R7989 Other specified abnormal findings of blood chemistry: Secondary | ICD-10-CM

## 2019-11-06 DIAGNOSIS — Z79899 Other long term (current) drug therapy: Secondary | ICD-10-CM

## 2019-11-06 DIAGNOSIS — M24521 Contracture, right elbow: Secondary | ICD-10-CM

## 2019-11-06 DIAGNOSIS — D702 Other drug-induced agranulocytosis: Secondary | ICD-10-CM

## 2019-11-06 DIAGNOSIS — R768 Other specified abnormal immunological findings in serum: Secondary | ICD-10-CM

## 2019-11-06 DIAGNOSIS — M24562 Contracture, left knee: Secondary | ICD-10-CM

## 2019-11-06 DIAGNOSIS — M24522 Contracture, left elbow: Secondary | ICD-10-CM

## 2019-11-06 DIAGNOSIS — M5416 Radiculopathy, lumbar region: Secondary | ICD-10-CM | POA: Diagnosis not present

## 2019-11-06 DIAGNOSIS — M24561 Contracture, right knee: Secondary | ICD-10-CM

## 2019-11-06 DIAGNOSIS — M5136 Other intervertebral disc degeneration, lumbar region: Secondary | ICD-10-CM

## 2019-11-06 DIAGNOSIS — M48061 Spinal stenosis, lumbar region without neurogenic claudication: Secondary | ICD-10-CM | POA: Diagnosis not present

## 2019-11-06 NOTE — Patient Instructions (Addendum)
Standing Labs We placed an order today for your standing lab work.    Please come back and get your standing labs in April and every 3 months   We have open lab daily Monday through Thursday from 8:30-12:30 PM and 1:30-4:30 PM and Friday from 8:30-12:30 PM and 1:30-4:00 PM at the office of Dr. Shaili Deveshwar.   You may experience shorter wait times on Monday and Friday afternoons. The office is located at [address], [address], [place], [place] [address] No appointment is necessary.   Labs are drawn by [facility].  You may receive a bill from [facility] for your lab work.  If you wish to have your labs drawn at another location, please call the office 24 hours in advance to send orders.  If you have any questions regarding directions or hours of operation,  please call [phone].   Just as a reminder please drink plenty of water prior to coming for your lab work. Thanks!   

## 2019-11-08 LAB — QUANTIFERON-TB GOLD PLUS
Mitogen-NIL: 3.4 IU/mL
NIL: 0.03 IU/mL
QuantiFERON-TB Gold Plus: NEGATIVE
TB1-NIL: <0 IU/mL
TB2-NIL: <0 IU/mL

## 2019-11-08 LAB — VITAMIN B12: Vitamin B-12: 577 pg/mL (ref 200–1100)

## 2019-11-09 NOTE — Progress Notes (Signed)
TB gold negative. B12 WNL.

## 2019-11-27 ENCOUNTER — Other Ambulatory Visit: Payer: Self-pay | Admitting: Rheumatology

## 2019-11-27 DIAGNOSIS — M0579 Rheumatoid arthritis with rheumatoid factor of multiple sites without organ or systems involvement: Secondary | ICD-10-CM

## 2019-11-27 NOTE — Telephone Encounter (Signed)
Last Visit: 11/06/19 Next Visit: 04/09/20 Labs: 08/31/19 WBC count is borderline low but has trended up. Hgb and Hct are low but improving as well. Creatinine is elevated-1.26. GFR is WNL-68.  TB Gold: 11/06/19 Neg   Patient to update labs this week  Okay to refill per Dr. Estanislado Pandy

## 2019-11-30 ENCOUNTER — Other Ambulatory Visit: Payer: Self-pay

## 2019-11-30 DIAGNOSIS — Z79899 Other long term (current) drug therapy: Secondary | ICD-10-CM

## 2019-11-30 LAB — COMPLETE METABOLIC PANEL WITH GFR
AG Ratio: 0.9 (calc) — ABNORMAL LOW (ref 1.0–2.5)
ALT: 23 U/L (ref 9–46)
AST: 29 U/L (ref 10–35)
Albumin: 3.4 g/dL — ABNORMAL LOW (ref 3.6–5.1)
Alkaline phosphatase (APISO): 111 U/L (ref 35–144)
BUN/Creatinine Ratio: 15 (calc) (ref 6–22)
BUN: 19 mg/dL (ref 7–25)
CO2: 24 mmol/L (ref 20–32)
Calcium: 9.1 mg/dL (ref 8.6–10.3)
Chloride: 106 mmol/L (ref 98–110)
Creat: 1.3 mg/dL — ABNORMAL HIGH (ref 0.70–1.25)
GFR, Est African American: 66 mL/min/{1.73_m2} (ref >=60)
GFR, Est Non African American: 57 mL/min/{1.73_m2} — ABNORMAL LOW (ref >=60)
Globulin: 3.9 g/dL (calc) — ABNORMAL HIGH (ref 1.9–3.7)
Glucose, Bld: 87 mg/dL (ref 65–99)
Potassium: 3.9 mmol/L (ref 3.5–5.3)
Sodium: 139 mmol/L (ref 135–146)
Total Bilirubin: 0.6 mg/dL (ref 0.2–1.2)
Total Protein: 7.3 g/dL (ref 6.1–8.1)

## 2019-11-30 LAB — CBC WITH DIFFERENTIAL/PLATELET
Absolute Monocytes: 315 cells/uL (ref 200–950)
Basophils Absolute: 21 cells/uL (ref 0–200)
Basophils Relative: 0.7 %
Eosinophils Absolute: 168 cells/uL (ref 15–500)
Eosinophils Relative: 5.6 %
HCT: 34.5 % — ABNORMAL LOW (ref 38.5–50.0)
Hemoglobin: 11.2 g/dL — ABNORMAL LOW (ref 13.2–17.1)
Lymphs Abs: 759 cells/uL — ABNORMAL LOW (ref 850–3900)
MCH: 26.1 pg — ABNORMAL LOW (ref 27.0–33.0)
MCHC: 32.5 g/dL (ref 32.0–36.0)
MCV: 80.4 fL (ref 80.0–100.0)
MPV: 10 fL (ref 7.5–12.5)
Monocytes Relative: 10.5 %
Neutro Abs: 1737 cells/uL (ref 1500–7800)
Neutrophils Relative %: 57.9 %
Platelets: 182 10*3/uL (ref 140–400)
RBC: 4.29 10*6/uL (ref 4.20–5.80)
RDW: 15.2 % — ABNORMAL HIGH (ref 11.0–15.0)
Total Lymphocyte: 25.3 %
WBC: 3 10*3/uL — ABNORMAL LOW (ref 3.8–10.8)

## 2019-12-01 NOTE — Progress Notes (Signed)
White cell count is low but is stable.  Creatinine is elevated and stable.  We will continue Enbrel and monitor labs every 3 months as scheduled.  Please forward labs to his PCP.

## 2019-12-26 ENCOUNTER — Telehealth: Payer: Self-pay | Admitting: Pharmacy Technician

## 2019-12-26 NOTE — Telephone Encounter (Signed)
Received notification from [facility] regarding a prior authorization for ENBREL. Authorization has been APPROVED from 11/09/19 to 08/09/20.   Authorization # [id number]  9:21 AM Beatriz Chancellor, CPhT

## 2019-12-26 NOTE — Telephone Encounter (Signed)
Submitted a Prior Authorization request to [employer] for ENBREL via Cover My Meds. Will update once we receive a response.  ([id number]) [id number]

## 2020-01-09 ENCOUNTER — Other Ambulatory Visit: Payer: Self-pay | Admitting: Rheumatology

## 2020-01-09 DIAGNOSIS — M0579 Rheumatoid arthritis with rheumatoid factor of multiple sites without organ or systems involvement: Secondary | ICD-10-CM

## 2020-01-09 NOTE — Telephone Encounter (Signed)
Last Visit: 11/06/2019 Next Visit: 04/09/2020 Labs: 11/30/2019 White cell count is low but is stable. Creatinine is elevated and stable. TB Gold: 11/06/2019 Neg   Current Dose per office note 11/06/2019: Enbrel 50 mg every 14 days  Okay to refill per Dr. Estanislado Pandy

## 2020-03-18 DIAGNOSIS — M069 Rheumatoid arthritis, unspecified: Secondary | ICD-10-CM | POA: Diagnosis not present

## 2020-03-18 DIAGNOSIS — E119 Type 2 diabetes mellitus without complications: Secondary | ICD-10-CM | POA: Diagnosis not present

## 2020-03-18 DIAGNOSIS — I10 Essential (primary) hypertension: Secondary | ICD-10-CM | POA: Diagnosis not present

## 2020-03-27 NOTE — Progress Notes (Signed)
Office Visit Note  Patient: Michael Hinton.             Date of Birth: [birth year]           MRN: [id number]             PCP: Cristy Folks, PA-C Referring: Grafton Folk* Visit Date: 04/09/2020 Occupation: @GUAROCC @  Subjective:  Medication monitoring  History of Present Illness: Michael Hinton. is a 69 y.o. male with history of seropositive rheumatoid arthritis and DDD.  Patient is on Enbrel 50 mg subcutaneous injections every 15 days.  He is due for his next injection today.  He denies any recent rheumatoid arthritis flares.  He denies any joint pain or joint swelling at this time.  He denies any morning stiffness.  He denies any difficulty with ADLs. He denies any recent infections.  He has received both COVID-19 vaccinations and is planning on receiving a third dose.   Activities of Daily Living:  Patient reports morning stiffness for 0 minutes.   Patient Denies nocturnal pain.  Difficulty dressing/grooming: Denies Difficulty climbing stairs: Denies Difficulty getting out of chair: Denies Difficulty using hands for taps, buttons, cutlery, and/or writing: Denies  Review of Systems  Constitutional: Negative for fatigue.  HENT: Negative for mouth sores, mouth dryness and nose dryness.   Eyes: Negative for itching and dryness.  Respiratory: Negative for shortness of breath and difficulty breathing.   Cardiovascular: Negative for chest pain and palpitations.  Gastrointestinal: Negative for blood in stool, constipation and diarrhea.  Endocrine: Positive for increased urination.  Genitourinary: Negative for difficulty urinating.  Musculoskeletal: Negative for arthralgias, joint pain, joint swelling, myalgias, morning stiffness, muscle tenderness and myalgias.  Skin: Negative for color change, rash and redness.  Allergic/Immunologic: Negative for susceptible to infections.  Neurological: Negative for dizziness, numbness, headaches, memory loss and  weakness.  Hematological: Negative for bruising/bleeding tendency.  Psychiatric/Behavioral: Negative for confusion.    PMFS History:  Patient Active Problem List   Diagnosis Date Noted  . DDD (degenerative disc disease), lumbar 03/11/2018  . ANA positive 01/22/2017  . 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 Rheumatoid arthritis involving multiple sites with positive rheumatoid factor ([place]) 06/02/2016  . High risk medication use 06/02/2016  . Hypertension 06/02/2016  . Vitamin D deficiency 06/02/2016    Past Medical History:  Diagnosis Date  . High risk medication use 06/02/2016  . Hypertension 06/02/2016  . Prediabetes   . Rheumatoid arthritis involving multiple sites with positive rheumatoid factor ([place]) 06/02/2016    Family History  Problem Relation Age of Onset  . Heart disease Mother   . Diabetes Mother   . Kidney disease Father   . Diabetes Father   . Diabetes Daughter    History reviewed. No pertinent surgical history. Social History   Social History Narrative  . Not on file   Immunization History  Administered Date(s) Administered  . Moderna SARS-COVID-2 Vaccination 10/12/2019, 11/15/2019     Objective: Vital Signs: BP 132/79 (BP Location: Left Arm, Patient Position: Sitting, Cuff Size: Normal)   Pulse 66   Resp 17   Ht 5\' 11"  (1.803 m)   Wt 209 lb 9.6 oz (95.1 kg)   BMI 29.23 kg/m    Physical Exam Vitals and nursing note reviewed.  Constitutional:      Appearance: He is well-developed.  HENT:     Head: Normocephalic and atraumatic.  Eyes:     Conjunctiva/sclera: Conjunctivae normal.     Pupils: Pupils are equal, round, and reactive to light.  Pulmonary:     Effort: Pulmonary effort is normal.  Abdominal:     Palpations: Abdomen is soft.  Musculoskeletal:     Cervical back: Normal range of motion and neck supple.  Skin:    General: Skin is warm and dry.     Capillary Refill:  Capillary refill takes less than 2 seconds.  Neurological:     Mental Status: He is alert and oriented to person, place, and time.  Psychiatric:        Behavior: Behavior normal.      Musculoskeletal Exam: C-spine has limited range of motion with lateral rotation.  Mild thoracic kyphosis.  Lumbar spine has good range of motion.  No midline spinal tenderness or SI joint tenderness.  Shoulder joints have slightly limited range of motion bilaterally.  Bilateral elbow joint contractures noted.  Limited range of motion of both wrist joints.  Synovial thickening of both wrists noted.  Synovial thickening of MCP joints and ulnar deviation noted.  Left third PIP joint contracture noted.  Hip joints both have limited range of motion with no discomfort at this time.  Right knee has limited flexion and extension with valgus deformity.  Warmth of the right knee joint was noted.  Left knee has good range of motion with no warmth or effusion.  Ankle joints are thickened but do not have any active inflammation or tenderness  CDAI Exam: CDAI Score: 0  Patient Global: 0 mm; Provider Global: 0 mm Swollen: 0 ; Tender: 0  Joint Exam 04/09/2020   No joint exam has been documented for this visit   There is currently no information documented on the homunculus. Go to the Rheumatology activity and complete the homunculus joint exam.  Investigation: No additional findings.  Imaging: XR Foot 2 Views Left  Result Date: 04/09/2020 Juxta-articular osteopenia was noted.  Narrowing of all MTP joints with erosive changes were noted.  PIP and DIP narrowing was noted.  Metatarsal tarsal, intertarsal narrowing was noted.  Severe tibiotalar narrowing and subtalar joint collapse was noted.  Inferior calcaneal spur was noted.  No interval change was noted when compared to the x-rays of 2018. Impression: These findings are consistent with severe rheumatoid arthritis and osteoarthritis overlap.  XR Foot 2 Views Right  Result  Date: 04/09/2020 Juxta-articular osteopenia was noted.  Subluxation of all MTP joints with erosive changes in all MTP joints was noted.  PIP and DIP narrowing with subluxation of some of the PIP and DIP joints was noted.  Intertarsal joint space narrowing was noted.  Subtalar joint space narrowing was noted.  Inferior calcaneal spur was noted.  No radiographic progression was noted when compared to the x-rays of 2018. 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  XR Hand 2 View Left  Result Date: 04/09/2020 Juxta-articular osteopenia was noted.  Severe narrowing of most MCP joints and fusion of the third MCP joint was noted.  Erosive changes were noted in all MCP joints.  Narrowing of all PIP and DIP joints was noted.  Fusion of third PIP joint was noted.  Fusion of metacarpocarpal, intercarpal joints and severe narrowing of radiocarpal joint was noted.  Erosive changes were noted in the carpal bones and also radius and ulna.  No radiographic progression was noted when compared to the x-rays of 2018. 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  XR Hand 2 View Right  Result Date: 04/09/2020 Juxta-articular osteopenia was noted.  Narrowing of all MCP joints was noted noted.  Subluxation of first MCP was noted.  Erosion was noted in the second MCP.  PIP and DIP narrowing was noted.  Fusion of metacarpocarpal, intercarpal joints was noted.  Erosive changes were noted in the carpal bones.  Severe radiocarpal joint space narrowing was noted.  Ulnar styloid erosions were noted.  No radiographic progression was noted when compared to the x-rays of 2018. Impression: These findings are consistent with rheumatoid arthritis and osteoarthritis overlap.   Recent Labs: Lab Results  Component Value Date   WBC 3.0 (L) 11/30/2019   HGB 11.2 (L) 11/30/2019   PLT 182 11/30/2019   NA 139  11/30/2019   K 3.9 11/30/2019   CL 106 11/30/2019   CO2 24 11/30/2019   GLUCOSE 87 11/30/2019   BUN 19 11/30/2019   CREATININE 1.30 (H) 11/30/2019   BILITOT 0.6 11/30/2019   ALKPHOS 82 12/24/2016   AST 29 11/30/2019   ALT 23 11/30/2019   PROT 7.3 11/30/2019   ALBUMIN 3.5 (L) 12/24/2016   CALCIUM 9.1 11/30/2019   GFRAA 66 11/30/2019   QFTBGOLD NEGATIVE 06/16/2017   QFTBGOLDPLUS NEGATIVE 11/06/2019    Speciality Comments: No specialty comments available.  Procedures:  No procedures performed Allergies: Patient has no known allergies.   Assessment / Plan:     Visit Diagnoses: Rheumatoid arthritis involving multiple sites with positive rheumatoid factor ([place]) - He has no joint tenderness or synovitis on exam.  He has limited flexion extension with valgus deformity and warmth of the right knee joint on exam.  He has synovial thickening of both wrists and all MCP joints but no synovitis was noted.  He has not had any recent rheumatoid arthritis flares.  He is clinically doing well on Enbrel 50 mg subcutaneous injections every 15 days.  He is due for his next Enbrel injection today.  X-rays of both hands and feet were obtained today to assess for radiographic progression.  He has not been experiencing any joint pain, joint swelling, morning stiffness, nocturnal pain, or difficulty with ADLs.  He will continue on Enbrel 50 mg subcutaneous injections every 15 days.  He was advised to notify us if he develops increased joint pain or joint swelling.  He will follow-up in the office in 5 months.  Plan: XR Hand 2 View Right, XR Hand 2 View Left, XR Foot 2 Views Right, XR Foot 2 Views Left  High risk medication use - Enbrel 50 mg every 15 days.  CBC and CMP were drawn on 11/30/2019 and results were reviewed with the patient today in the office.  He is overdue to update lab work.  CBC and CMP were drawn today to monitor for drug toxicity.  He will require frequent lab monitoring due to history of low  white blood cell count.  TB gold was negative on 11/06/2019 and will continue to be monitored yearly.- Plan: CBC with Differential/Platelet, COMPLETE METABOLIC PANEL WITH GFR He has not had any recent infections.  He was advised to hold Enbrel if he develops signs or symptoms of an infection and to resume once the infection has completely cleared.  He has received both COVID-19 vaccinations and was encouraged to receive the third dose.  He was advised to continue to wear a mask and social distance.  He was advised to notify us or his PCP if  he develops a COVID-19 infection in order to receive the antibody infusion.  He voiced understanding.  Spinal stenosis of lumbar region without neurogenic claudication: He experiences occasional lower back pain which is exacerbated by playing pool.  He has no midline spinal tenderness on exam today.  He is not having any symptoms of radiculopathy.  Radiculopathy, lumbar region: He is not experiencing any symptoms of radiculopathy at this time.  DDD (degenerative disc disease), lumbar: He experiences intermittent lower back pain which is exacerbated by playing pool.  He is not experiencing any symptoms of radiculopathy at this time.  Her discomfort has been intermittent and tolerable.  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  No tenderness or inflammation.  Contracture of right elbow: Chronic and unchanged.  No tenderness to palpation.  Contractures of both knees: He has limited flexion extension of the right knee joint.  Valgus deformity of the right knee joint was noted.  Left knee has good range of motion with no discomfort at this time.  ANA positive - He has no other clinical features of autoimmune disease.   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  CBC was drawn today.  High serum vitamin B12  Orders: Orders Placed This Encounter  Procedures  . XR Hand 2 View Right  . XR Hand 2 View Left  . XR  Foot 2 Views Right  . XR Foot 2 Views Left  . CBC with Differential/Platelet  . COMPLETE METABOLIC PANEL WITH GFR   No orders of the defined types were placed in this encounter.    Follow-Up Instructions: Return in about 5 months (around 09/09/2020) for Rheumatoid arthritis.   Ofilia Neas, PA-C  Note - This record has been created using Dragon software.  Chart creation errors have been sought, but may not always  have been located. Such creation errors do not reflect on  the standard of medical care.

## 2020-04-09 ENCOUNTER — Other Ambulatory Visit: Payer: Self-pay

## 2020-04-09 ENCOUNTER — Ambulatory Visit: Payer: Self-pay

## 2020-04-09 ENCOUNTER — Ambulatory Visit: Payer: Medicare HMO | Admitting: Physician Assistant

## 2020-04-09 ENCOUNTER — Telehealth: Payer: Self-pay | Admitting: Pharmacy Technician

## 2020-04-09 ENCOUNTER — Encounter: Payer: Self-pay | Admitting: Physician Assistant

## 2020-04-09 VITALS — BP 132/79 | HR 66 | Resp 17 | Ht 71.0 in | Wt 209.6 lb

## 2020-04-09 DIAGNOSIS — M5416 Radiculopathy, lumbar region: Secondary | ICD-10-CM

## 2020-04-09 DIAGNOSIS — Z79899 Other long term (current) drug therapy: Secondary | ICD-10-CM | POA: Diagnosis not present

## 2020-04-09 DIAGNOSIS — M48061 Spinal stenosis, lumbar region without neurogenic claudication: Secondary | ICD-10-CM | POA: Diagnosis not present

## 2020-04-09 DIAGNOSIS — M25542 Pain in joints of left hand: Secondary | ICD-10-CM | POA: Diagnosis not present

## 2020-04-09 DIAGNOSIS — R7989 Other specified abnormal findings of blood chemistry: Secondary | ICD-10-CM

## 2020-04-09 DIAGNOSIS — M79641 Pain in right hand: Secondary | ICD-10-CM | POA: Diagnosis not present

## 2020-04-09 DIAGNOSIS — R768 Other specified abnormal immunological findings in serum: Secondary | ICD-10-CM

## 2020-04-09 DIAGNOSIS — D702 Other drug-induced agranulocytosis: Secondary | ICD-10-CM

## 2020-04-09 DIAGNOSIS — M79642 Pain in left hand: Secondary | ICD-10-CM | POA: Diagnosis not present

## 2020-04-09 DIAGNOSIS — M24522 Contracture, left elbow: Secondary | ICD-10-CM | POA: Diagnosis not present

## 2020-04-09 DIAGNOSIS — M5136 Other intervertebral disc degeneration, lumbar region: Secondary | ICD-10-CM

## 2020-04-09 DIAGNOSIS — M24562 Contracture, left knee: Secondary | ICD-10-CM

## 2020-04-09 DIAGNOSIS — M24521 Contracture, right elbow: Secondary | ICD-10-CM | POA: Diagnosis not present

## 2020-04-09 DIAGNOSIS — M25541 Pain in joints of right hand: Secondary | ICD-10-CM | POA: Diagnosis not present

## 2020-04-09 DIAGNOSIS — M0579 Rheumatoid arthritis with rheumatoid factor of multiple sites without organ or systems involvement: Secondary | ICD-10-CM | POA: Diagnosis not present

## 2020-04-09 DIAGNOSIS — M24561 Contracture, right knee: Secondary | ICD-10-CM

## 2020-04-09 NOTE — Telephone Encounter (Signed)
Patient has been enrolled into new PAF grant for RA copay assistance. Coverage is through 04/09/2021.  Cardholder: [id number] BIN: [id number] PCN: [id number] Group: [id number] For pharmacy inquiries, contact PDMI at [phone]. For patient inquiries, contact PAF at [phone].

## 2020-04-09 NOTE — Patient Instructions (Signed)
Standing Labs We placed an order today for your standing lab work.   Please have your standing labs drawn in November and every 3 months   If possible, please have your labs drawn 2 weeks prior to your appointment so that the provider can discuss your results at your appointment.  We have open lab daily Monday through Thursday from 8:30-12:30 PM and 1:30-4:30 PM and Friday from 8:30-12:30 PM and 1:30-4:00 PM at the office of Dr. Bo Merino, [facility].   Please be advised, patients with office appointments requiring lab work will take precedents over walk-in lab work.  If possible, please come for your lab work on Monday and Friday afternoons, as you may experience shorter wait times. The office is located at [address] [place], [place], [place] [address] No appointment is necessary.   Labs are drawn by [facility]. Please bring your co-pay at the time of your lab draw.  You may receive a bill from [facility] for your lab work.  If you wish to have your labs drawn at another location, please call the office 24 hours in advance to send orders.  If you have any questions regarding directions or hours of operation,  please call [phone].   As a reminder, please drink plenty of water prior to coming for your lab work. Thanks!  COVID-19 vaccine recommendations:   COVID-19 vaccine is recommended for everyone (unless you are allergic to a vaccine component), even if you are on a medication that suppresses your immune system.   If you are on Methotrexate, Cellcept (mycophenolate), Rinvoq, Morrie Sheldon, and Olumiant- hold the medication for 1 week after each vaccine. Hold Methotrexate for 2 weeks after the single dose COVID-19 vaccine.   If you are on Orencia subcutaneous injection - hold medication one week prior to and one week after the first COVID-19 vaccine dose (only).   If you are on Orencia IV infusions- time vaccination administration so that the first COVID-19  vaccination will occur four weeks after the infusion and postpone the subsequent infusion by one week.   If you are on Cyclophosphamide or Rituxan infusions please contact your doctor prior to receiving the COVID-19 vaccine.   Do not take Tylenol or any anti-inflammatory medications (NSAIDs) 24 hours prior to the COVID-19 vaccination.   There is no direct evidence about the efficacy of the COVID-19 vaccine in individuals who are on medications that suppress the immune system.   Even if you are fully vaccinated, and you are on any medications that suppress your immune system, please continue to wear a mask, maintain at least six feet social distance and practice hand hygiene.   If you develop a COVID-19 infection, please contact your PCP or our office to determine if you need antibody infusion.  The booster vaccine is now available for immunocompromised patients. It is advised that if you had Pfizer vaccine you should get Coca[employer]a booster.  If you had a Moderna vaccine then you should get a Moderna booster. Johnson and Wynetta Emery does not have a booster vaccine at this time.  Please see the following web sites for updated information.   [email or web address]://[email or web address]/[email or web address]-Vaccination-Patient-Resources.[email or web address]  [email or web address]://[email or web address]/About-Us/[email or web address]-Releases/ID/1159

## 2020-04-10 LAB — COMPLETE METABOLIC PANEL WITH GFR
AG Ratio: 1 (calc) (ref 1.0–2.5)
ALT: 16 U/L (ref 9–46)
AST: 21 U/L (ref 10–35)
Albumin: 3.8 g/dL (ref 3.6–5.1)
Alkaline phosphatase (APISO): 111 U/L (ref 35–144)
BUN: 14 mg/dL (ref 7–25)
CO2: 24 mmol/L (ref 20–32)
Calcium: 9.5 mg/dL (ref 8.6–10.3)
Chloride: 105 mmol/L (ref 98–110)
Creat: 1.24 mg/dL (ref 0.70–1.25)
GFR, Est African American: 70 mL/min/{1.73_m2} (ref >=60)
GFR, Est Non African American: 60 mL/min/{1.73_m2} (ref >=60)
Globulin: 3.7 g/dL (calc) (ref 1.9–3.7)
Glucose, Bld: 91 mg/dL (ref 65–99)
Potassium: 3.9 mmol/L (ref 3.5–5.3)
Sodium: 136 mmol/L (ref 135–146)
Total Bilirubin: 0.6 mg/dL (ref 0.2–1.2)
Total Protein: 7.5 g/dL (ref 6.1–8.1)

## 2020-04-10 LAB — CBC WITH DIFFERENTIAL/PLATELET
Absolute Monocytes: 366 cells/uL (ref 200–950)
Basophils Absolute: 30 cells/uL (ref 0–200)
Basophils Relative: 0.9 %
Eosinophils Absolute: 168 cells/uL (ref 15–500)
Eosinophils Relative: 5.1 %
HCT: 35.8 % — ABNORMAL LOW (ref 38.5–50.0)
Hemoglobin: 11.9 g/dL — ABNORMAL LOW (ref 13.2–17.1)
Lymphs Abs: 894 cells/uL (ref 850–3900)
MCH: 26.5 pg — ABNORMAL LOW (ref 27.0–33.0)
MCHC: 33.2 g/dL (ref 32.0–36.0)
MCV: 79.7 fL — ABNORMAL LOW (ref 80.0–100.0)
MPV: 9.6 fL (ref 7.5–12.5)
Monocytes Relative: 11.1 %
Neutro Abs: 1841 cells/uL (ref 1500–7800)
Neutrophils Relative %: 55.8 %
Platelets: 178 10*3/uL (ref 140–400)
RBC: 4.49 10*6/uL (ref 4.20–5.80)
RDW: 13.8 % (ref 11.0–15.0)
Total Lymphocyte: 27.1 %
WBC: 3.3 10*3/uL — ABNORMAL LOW (ref 3.8–10.8)

## 2020-04-10 NOTE — Progress Notes (Signed)
WBC count is low-3.3 but has trended up. Hgb and Hct are low but trending up.  CMP WNL.

## 2020-04-11 DIAGNOSIS — I10 Essential (primary) hypertension: Secondary | ICD-10-CM | POA: Diagnosis not present

## 2020-04-11 DIAGNOSIS — E785 Hyperlipidemia, unspecified: Secondary | ICD-10-CM | POA: Diagnosis not present

## 2020-04-12 DIAGNOSIS — E119 Type 2 diabetes mellitus without complications: Secondary | ICD-10-CM | POA: Diagnosis not present

## 2020-04-12 DIAGNOSIS — R399 Unspecified symptoms and signs involving the genitourinary system: Secondary | ICD-10-CM | POA: Diagnosis not present

## 2020-04-12 DIAGNOSIS — Z833 Family history of diabetes mellitus: Secondary | ICD-10-CM | POA: Diagnosis not present

## 2020-04-12 DIAGNOSIS — R809 Proteinuria, unspecified: Secondary | ICD-10-CM | POA: Diagnosis not present

## 2020-04-12 DIAGNOSIS — E785 Hyperlipidemia, unspecified: Secondary | ICD-10-CM | POA: Diagnosis not present

## 2020-04-12 DIAGNOSIS — Z0000 Encounter for general adult medical examination without abnormal findings: Secondary | ICD-10-CM | POA: Diagnosis not present

## 2020-04-12 DIAGNOSIS — D649 Anemia, unspecified: Secondary | ICD-10-CM | POA: Diagnosis not present

## 2020-04-12 DIAGNOSIS — I10 Essential (primary) hypertension: Secondary | ICD-10-CM | POA: Diagnosis not present

## 2020-05-01 ENCOUNTER — Telehealth: Payer: Self-pay | Admitting: *Deleted

## 2020-05-01 NOTE — Telephone Encounter (Signed)
Per Tiffany, referral is for hematologic disorder vs side effects from Enbrel. (Est. Pt)  I was unable to reach pt. .A detailed message was left on his VM for him to contact the office at his earliest convenience to F/U with Dr.Yu as requested.

## 2020-05-07 ENCOUNTER — Telehealth: Payer: Self-pay | Admitting: Pharmacy Technician

## 2020-05-07 NOTE — Telephone Encounter (Signed)
Patient has switched back to [facility].  Submitted a Prior Authorization request to [facility] for ENBREL via Cover My Meds. Will update once we receive a response.   (KeyJaneece Fitting) - [id number]

## 2020-05-07 NOTE — Telephone Encounter (Signed)
Received notification from [facility] regarding a prior authorization for ENBREL. Authorization has been APPROVED from 08/11/19 to 08/09/20.   Authorization # [id number]

## 2020-05-08 ENCOUNTER — Other Ambulatory Visit: Payer: Self-pay | Admitting: Rheumatology

## 2020-05-08 ENCOUNTER — Other Ambulatory Visit (HOSPITAL_COMMUNITY): Payer: Self-pay | Admitting: Rheumatology

## 2020-05-08 DIAGNOSIS — M0579 Rheumatoid arthritis with rheumatoid factor of multiple sites without organ or systems involvement: Secondary | ICD-10-CM

## 2020-05-08 NOTE — Telephone Encounter (Signed)
Last Visit: 04/09/2020 Next Visit: 09/09/2020 Labs: 04/09/2020 WBC count is low-3.3 but has trended up. Hgb and Hct are low but trending up. CMP WNL.  TB Gold: 11/06/2019 Neg   Current Dose per office note 04/09/2020: Enbrel 50 mg every 15 days  DX: Rheumatoid arthritis involving multiple sites with positive rheumatoid factor   Okay to refill Enbrel?

## 2020-05-15 DIAGNOSIS — Z1211 Encounter for screening for malignant neoplasm of colon: Secondary | ICD-10-CM | POA: Diagnosis not present

## 2020-05-17 DIAGNOSIS — E119 Type 2 diabetes mellitus without complications: Secondary | ICD-10-CM | POA: Diagnosis not present

## 2020-05-17 DIAGNOSIS — I10 Essential (primary) hypertension: Secondary | ICD-10-CM | POA: Diagnosis not present

## 2020-05-29 DIAGNOSIS — E119 Type 2 diabetes mellitus without complications: Secondary | ICD-10-CM | POA: Diagnosis not present

## 2020-05-29 DIAGNOSIS — I10 Essential (primary) hypertension: Secondary | ICD-10-CM | POA: Diagnosis not present

## 2020-06-07 DIAGNOSIS — Z125 Encounter for screening for malignant neoplasm of prostate: Secondary | ICD-10-CM | POA: Diagnosis not present

## 2020-06-07 DIAGNOSIS — Z0000 Encounter for general adult medical examination without abnormal findings: Secondary | ICD-10-CM | POA: Diagnosis not present

## 2020-06-10 ENCOUNTER — Other Ambulatory Visit (HOSPITAL_COMMUNITY): Payer: Self-pay | Admitting: Physician Assistant

## 2020-06-10 ENCOUNTER — Other Ambulatory Visit: Payer: Self-pay | Admitting: Rheumatology

## 2020-06-10 DIAGNOSIS — E119 Type 2 diabetes mellitus without complications: Secondary | ICD-10-CM | POA: Diagnosis not present

## 2020-06-10 DIAGNOSIS — M0579 Rheumatoid arthritis with rheumatoid factor of multiple sites without organ or systems involvement: Secondary | ICD-10-CM

## 2020-06-10 DIAGNOSIS — I10 Essential (primary) hypertension: Secondary | ICD-10-CM | POA: Diagnosis not present

## 2020-06-10 NOTE — Telephone Encounter (Signed)
Last Visit: 04/09/2020 Next Visit: 09/09/2020 Labs: 04/09/2020 WBC count is low-3.3 but has trended up. Hgb and Hct are low but trending up. CMP WNL.  TB Gold: 11/06/2019 Neg   Current Dose per office note on 04/09/2020: Enbrel 50 mg every 15 days Dx: Rheumatoid arthritis involving multiple sites with positive rheumatoid factor   Okay to refill Enbrel?

## 2020-07-11 DIAGNOSIS — R0781 Pleurodynia: Secondary | ICD-10-CM | POA: Diagnosis not present

## 2020-07-11 DIAGNOSIS — M25512 Pain in left shoulder: Secondary | ICD-10-CM | POA: Diagnosis not present

## 2020-07-11 DIAGNOSIS — M545 Low back pain, unspecified: Secondary | ICD-10-CM | POA: Diagnosis not present

## 2020-07-11 DIAGNOSIS — W108XXA Fall (on) (from) other stairs and steps, initial encounter: Secondary | ICD-10-CM | POA: Diagnosis not present

## 2020-07-11 DIAGNOSIS — M25511 Pain in right shoulder: Secondary | ICD-10-CM | POA: Diagnosis not present

## 2020-07-22 DIAGNOSIS — E119 Type 2 diabetes mellitus without complications: Secondary | ICD-10-CM | POA: Diagnosis not present

## 2020-07-22 DIAGNOSIS — I10 Essential (primary) hypertension: Secondary | ICD-10-CM | POA: Diagnosis not present

## 2020-07-29 ENCOUNTER — Telehealth: Payer: Self-pay | Admitting: Pharmacist

## 2020-07-29 NOTE — Telephone Encounter (Signed)
Received notification from [facility] regarding a prior authorization for [facility]. Authorization has been APPROVED from 07/25/20 to 08/09/21.   Phone # [phone]  Knox Saliva, PharmD, MPH Clinical Pharmacist (Rheumatology and Pulmonology)

## 2020-08-27 NOTE — Progress Notes (Signed)
Office Visit Note  Patient: Michael Hinton.             Date of Birth: 1951/03/13           MRN: [id number]             PCP: Cristy Folks, PA-C Referring: Grafton Folk* Visit Date: 09/09/2020 Occupation: @GUAROCC @  Subjective:  Medication monitoring   History of Present Illness: Michael Hinton. is a 70 y.o. male with history of rheumatoid arthritis and DDD.  He is on enbrel 50 mg sq injections every 15 days.  He has not had any recent flares.  He denies any joint pain, joint swelling, or joint stiffness.  Patient reports that he typically does not experience increased joint pain or stiffness leading up to his Enbrel injections every 15 days.  He has not had any difficulty with ADLs.  He has been sleeping well at night and his energy level has been stable.  He denies any new questions or concerns.  He has not been diagnosed with any new medical conditions since his last office visit. He denies any recent infections.   Activities of Daily Living:  Patient reports morning stiffness for 0 minutes.   Patient Denies nocturnal pain.  Difficulty dressing/grooming: Denies Difficulty climbing stairs: Denies Difficulty getting out of chair: Denies Difficulty using hands for taps, buttons, cutlery, and/or writing: Denies  Review of Systems  Constitutional: Negative for fatigue.  HENT: Negative for mouth sores, mouth dryness and nose dryness.   Eyes: Negative for pain, itching and dryness.  Respiratory: Negative for shortness of breath and difficulty breathing.   Cardiovascular: Negative for chest pain and palpitations.  Gastrointestinal: Negative for blood in stool, constipation and diarrhea.  Endocrine: Negative for increased urination.  Genitourinary: Negative for difficulty urinating.  Musculoskeletal: Negative for arthralgias, joint pain, joint swelling, myalgias, morning stiffness, muscle tenderness and myalgias.  Skin: Negative for color change, rash and  redness.  Allergic/Immunologic: Negative for susceptible to infections.  Neurological: Negative for dizziness, numbness, headaches, memory loss and weakness.  Hematological: Negative for bruising/bleeding tendency.  Psychiatric/Behavioral: Negative for confusion.    PMFS History:  Patient Active Problem List   Diagnosis Date Noted  . DDD (degenerative disc disease), lumbar 03/11/2018  . ANA positive 01/22/2017  . 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 Rheumatoid arthritis involving multiple sites with positive rheumatoid factor ([place]) 06/02/2016  . High risk medication use 06/02/2016  . Hypertension 06/02/2016  . Vitamin D deficiency 06/02/2016    Past Medical History:  Diagnosis Date  . High risk medication use 06/02/2016  . Hypertension 06/02/2016  . Prediabetes   . Rheumatoid arthritis involving multiple sites with positive rheumatoid factor ([place]) 06/02/2016    Family History  Problem Relation Age of Onset  . Heart disease Mother   . Diabetes Mother   . Kidney disease Father   . Diabetes Father   . Diabetes Daughter    History reviewed. No pertinent surgical history. Social History   Social History Narrative  . Not on file   Immunization History  Administered Date(s) Administered  . Moderna Sars-Covid-2 Vaccination 10/12/2019, 11/15/2019     Objective: Vital Signs: BP 127/80 (BP Location: Left Arm, Patient Position: Sitting, Cuff Size: Normal)   Pulse 76   Resp 17   Ht 5\' 11"  (1.803 m)   Wt 207 lb (93.9 kg)   BMI 28.87 kg/m  Physical Exam Vitals and nursing note reviewed.  Constitutional:      Appearance: He is well-developed and well-nourished.  HENT:     Head: Normocephalic and atraumatic.  Eyes:     Extraocular Movements: EOM normal.     Conjunctiva/sclera: Conjunctivae normal.     Pupils: Pupils are equal, round, and reactive to light.  Pulmonary:     Effort: Pulmonary effort is  normal.  Abdominal:     Palpations: Abdomen is soft.  Musculoskeletal:     Cervical back: Normal range of motion and neck supple.  Skin:    General: Skin is warm and dry.     Capillary Refill: Capillary refill takes less than 2 seconds.  Neurological:     Mental Status: He is alert and oriented to person, place, and time.  Psychiatric:        Mood and Affect: Mood and affect normal.        Behavior: Behavior normal.      Musculoskeletal Exam: C-spine has limited range of motion with lateral rotation.  Mild thoracic kyphosis noted.  No midline spinal tenderness.  No SI joint tenderness.  Shoulder joints have limited range of motion with abduction but no discomfort or tenderness was noted.  Bilateral elbow joint contractures noted, right greater than left.  Limited range of motion of both wrist joints with synovial thickening bilaterally.  Synovial thickening ulnar deviation of MCP joints.  Contracture of the left third PIP noted.  Hip joints have good range of motion with no discomfort.  Right knee has limited flexion and extension with warmth.  Valgus deformity of the right knee noted.  Left knee has slightly limited extension but no warmth or effusion was noted.  Ankle joints have good range of motion with synovial thickening but no tenderness or inflammation was noted.  CDAI Exam: CDAI Score: 0  Patient Global: 0 mm; Provider Global: 0 mm Swollen: 0 ; Tender: 0  Joint Exam 09/09/2020   No joint exam has been documented for this visit   There is currently no information documented on the homunculus. Go to the Rheumatology activity and complete the homunculus joint exam.  Investigation: No additional findings.  Imaging: No results found.  Recent Labs: Lab Results  Component Value Date   WBC 3.3 (L) 04/09/2020   HGB 11.9 (L) 04/09/2020   PLT 178 04/09/2020   NA 136 04/09/2020   K 3.9 04/09/2020   CL 105 04/09/2020   CO2 24 04/09/2020   GLUCOSE 91 04/09/2020   BUN 14  04/09/2020   CREATININE 1.24 04/09/2020   BILITOT 0.6 04/09/2020   ALKPHOS 82 12/24/2016   AST 21 04/09/2020   ALT 16 04/09/2020   PROT 7.5 04/09/2020   ALBUMIN 3.5 (L) 12/24/2016   CALCIUM 9.5 04/09/2020   GFRAA 70 04/09/2020   QFTBGOLD NEGATIVE 06/16/2017   QFTBGOLDPLUS NEGATIVE 11/06/2019    Speciality Comments: No specialty comments available.  Procedures:  No procedures performed Allergies: Patient has no known allergies.   Assessment / Plan:     Visit Diagnoses: Rheumatoid arthritis involving multiple sites with positive rheumatoid factor ([place]): He was no synovitis on exam.  Unchanged flexion contractures of both elbows and the right knee joint.  Warmth of the right knee but no effusion was noted on examination today.  He has synovial thickening and ulnar deviation of MCPs but no tenderness or synovitis was noted.  He has not had any recent rheumatoid arthritis flares.  He has clinically been doing  well on Enbrel 50 mg sq injections every 15 days.  He has been spacing the dose of Enbrel due to history of neutropenia.  His white blood cell count has been stable on the current dose of Enbrel.  He has not been experiencing any increased joint pain or stiffness leading up to his Enbrel injections every 15 days.  We will continue on the current treatment regimen.  We will check CBC and CMP today.  He was advised to notify us if he develops increased joint pain or joint swelling.  He will follow-up in the office in 5 months.  High risk medication use - Enbrel 50 mg sq injections every 15 days.  CBC and CMP were drawn on 04/09/2020.  He is due to update lab work today.  Orders for CBC and CMP were released.  His next lab work will be due in April and every 3 months to monitor for drug toxicity.  Standing orders for CBC and CMP remain in place.  TB gold negative on 11/06/2019.  Future order for TB gold was placed today.- Plan: CBC with Differential/Platelet, COMPLETE METABOLIC PANEL WITH GFR,  QuantiFERON-TB Gold Plus He has not had any recent infections.  We discussed the importance of holding Enbrel if he develops signs or symptoms of infection and to resume once the infection has completely cleared.  He voiced understanding. He has received 2 moderna COVID-19 vaccinations and was encouraged to receive the booster dose.  He was advised to avoid taking Tylenol and NSAIDs 24 hours prior to the third dose.  Spinal stenosis of lumbar region without neurogenic claudication: He has not been experiencing any increased lower back pain or stiffness recently.  Radiculopathy, lumbar region: Resolved   DDD (degenerative disc disease), lumbar: No midline spinal tenderness was noted on examination today.  He has not been experiencing any lower back pain or symptoms of radiculopathy.  Contracture of left elbow: Unchanged.  He has no tenderness or inflammation.  Contracture of right elbow: Unchanged flexion contracture of the right elbow.  Rheumatoid nodules palpable in the extensor surface of the right elbow.   Contractures of both knees: He has limited flexion and extension of the right knee joint on exam.  Valgus deformity of the right knee also noted. Warmth but no effusion noted. He has slightly limited extension of the left knee joint. He has occasional pain and stiffness in his right knee joint but it has been tolerable.   ANA positive - He has no clinical features of systemic lupus.   Other drug-induced neutropenia ([place]): WBC count was 3.3 on 04/09/2020.  We will recheck CBC today.  High serum vitamin B12  Screening for tuberculosis -Future order for TB gold placed today.  Plan: QuantiFERON-TB Gold Plus  Orders: Orders Placed This Encounter  Procedures  . CBC with Differential/Platelet  . COMPLETE METABOLIC PANEL WITH GFR  . QuantiFERON-TB Gold Plus   No orders of the defined types were placed in this encounter.   Follow-Up Instructions: Return in about 5 months (around  02/06/2021) for Rheumatoid arthritis, DDD.   Ofilia Neas, PA-C  Note - This record has been created using Dragon software.  Chart creation errors have been sought, but may not always  have been located. Such creation errors do not reflect on  the standard of medical care.

## 2020-08-28 DIAGNOSIS — E559 Vitamin D deficiency, unspecified: Secondary | ICD-10-CM | POA: Diagnosis not present

## 2020-08-28 DIAGNOSIS — I10 Essential (primary) hypertension: Secondary | ICD-10-CM | POA: Diagnosis not present

## 2020-08-28 DIAGNOSIS — E119 Type 2 diabetes mellitus without complications: Secondary | ICD-10-CM | POA: Diagnosis not present

## 2020-09-09 ENCOUNTER — Ambulatory Visit (INDEPENDENT_AMBULATORY_CARE_PROVIDER_SITE_OTHER): Payer: Medicare HMO | Admitting: Physician Assistant

## 2020-09-09 ENCOUNTER — Encounter: Payer: Self-pay | Admitting: Physician Assistant

## 2020-09-09 ENCOUNTER — Other Ambulatory Visit: Payer: Self-pay

## 2020-09-09 VITALS — BP 127/80 | HR 76 | Resp 17 | Ht 71.0 in | Wt 207.0 lb

## 2020-09-09 DIAGNOSIS — M0579 Rheumatoid arthritis with rheumatoid factor of multiple sites without organ or systems involvement: Secondary | ICD-10-CM | POA: Diagnosis not present

## 2020-09-09 DIAGNOSIS — D702 Other drug-induced agranulocytosis: Secondary | ICD-10-CM

## 2020-09-09 DIAGNOSIS — M24521 Contracture, right elbow: Secondary | ICD-10-CM | POA: Diagnosis not present

## 2020-09-09 DIAGNOSIS — M24561 Contracture, right knee: Secondary | ICD-10-CM | POA: Diagnosis not present

## 2020-09-09 DIAGNOSIS — M48061 Spinal stenosis, lumbar region without neurogenic claudication: Secondary | ICD-10-CM | POA: Diagnosis not present

## 2020-09-09 DIAGNOSIS — M5136 Other intervertebral disc degeneration, lumbar region: Secondary | ICD-10-CM | POA: Diagnosis not present

## 2020-09-09 DIAGNOSIS — Z111 Encounter for screening for respiratory tuberculosis: Secondary | ICD-10-CM

## 2020-09-09 DIAGNOSIS — R7989 Other specified abnormal findings of blood chemistry: Secondary | ICD-10-CM

## 2020-09-09 DIAGNOSIS — M5416 Radiculopathy, lumbar region: Secondary | ICD-10-CM

## 2020-09-09 DIAGNOSIS — R768 Other specified abnormal immunological findings in serum: Secondary | ICD-10-CM | POA: Diagnosis not present

## 2020-09-09 DIAGNOSIS — Z79899 Other long term (current) drug therapy: Secondary | ICD-10-CM

## 2020-09-09 DIAGNOSIS — M24522 Contracture, left elbow: Secondary | ICD-10-CM

## 2020-09-09 DIAGNOSIS — M24562 Contracture, left knee: Secondary | ICD-10-CM

## 2020-09-09 NOTE — Patient Instructions (Signed)
Standing Labs We placed an order today for your standing lab work.   Please have your standing labs drawn in April and every 3 months  TB gold due at end of March   If possible, please have your labs drawn 2 weeks prior to your appointment so that the provider can discuss your results at your appointment.  We have open lab daily Monday through Thursday from 8:30-12:30 PM and 1:30-4:30 PM and Friday from 8:30-12:30 PM and 1:30-4:00 PM at the office of Dr. Bo Merino, [facility].   Please be advised, all patients with office appointments requiring lab work will take precedents over walk-in lab work.  If possible, please come for your lab work on Monday and Friday afternoons, as you may experience shorter wait times. The office is located at [address], [place], [place], [place] [address] No appointment is necessary.   Labs are drawn by [facility]. Please bring your co-pay at the time of your lab draw.  You may receive a bill from [facility] for your lab work.  If you wish to have your labs drawn at another location, please call the office 24 hours in advance to send orders.  If you have any questions regarding directions or hours of operation,  please call [phone].   As a reminder, please drink plenty of water prior to coming for your lab work. Thanks!

## 2020-09-10 LAB — COMPLETE METABOLIC PANEL WITH GFR
AG Ratio: 1 (calc) (ref 1.0–2.5)
ALT: 16 U/L (ref 9–46)
AST: 21 U/L (ref 10–35)
Albumin: 4 g/dL (ref 3.6–5.1)
Alkaline phosphatase (APISO): 119 U/L (ref 35–144)
BUN: 19 mg/dL (ref 7–25)
CO2: 25 mmol/L (ref 20–32)
Calcium: 9.4 mg/dL (ref 8.6–10.3)
Chloride: 106 mmol/L (ref 98–110)
Creat: 1.17 mg/dL (ref 0.70–1.25)
GFR, Est African American: 74 mL/min/{1.73_m2} (ref >=60)
GFR, Est Non African American: 64 mL/min/{1.73_m2} (ref >=60)
Globulin: 4 g/dL (calc) — ABNORMAL HIGH (ref 1.9–3.7)
Glucose, Bld: 91 mg/dL (ref 65–99)
Potassium: 4.2 mmol/L (ref 3.5–5.3)
Sodium: 139 mmol/L (ref 135–146)
Total Bilirubin: 0.6 mg/dL (ref 0.2–1.2)
Total Protein: 8 g/dL (ref 6.1–8.1)

## 2020-09-10 LAB — CBC WITH DIFFERENTIAL/PLATELET
Absolute Monocytes: 354 cells/uL (ref 200–950)
Basophils Absolute: 20 cells/uL (ref 0–200)
Basophils Relative: 0.6 %
Eosinophils Absolute: 160 cells/uL (ref 15–500)
Eosinophils Relative: 4.7 %
HCT: 36.3 % — ABNORMAL LOW (ref 38.5–50.0)
Hemoglobin: 11.8 g/dL — ABNORMAL LOW (ref 13.2–17.1)
Lymphs Abs: 1037 cells/uL (ref 850–3900)
MCH: 26.6 pg — ABNORMAL LOW (ref 27.0–33.0)
MCHC: 32.5 g/dL (ref 32.0–36.0)
MCV: 81.9 fL (ref 80.0–100.0)
MPV: 10.5 fL (ref 7.5–12.5)
Monocytes Relative: 10.4 %
Neutro Abs: 1829 cells/uL (ref 1500–7800)
Neutrophils Relative %: 53.8 %
Platelets: 196 10*3/uL (ref 140–400)
RBC: 4.43 10*6/uL (ref 4.20–5.80)
RDW: 14.7 % (ref 11.0–15.0)
Total Lymphocyte: 30.5 %
WBC: 3.4 10*3/uL — ABNORMAL LOW (ref 3.8–10.8)

## 2020-09-10 NOTE — Progress Notes (Signed)
WBC count is low but trending up.  Hgb and hct are low but stable.   Globulin remains borderline elevated. Rest of CMP WNL.  We will continue to monitor closely.

## 2020-09-19 ENCOUNTER — Other Ambulatory Visit: Payer: Self-pay | Admitting: *Deleted

## 2020-09-19 DIAGNOSIS — M0579 Rheumatoid arthritis with rheumatoid factor of multiple sites without organ or systems involvement: Secondary | ICD-10-CM

## 2020-09-19 MED ORDER — ENBREL SURECLICK 50 MG/ML ~~LOC~~ SOAJ: SUBCUTANEOUS | 1 refills | Status: DC

## 2020-09-19 NOTE — Telephone Encounter (Signed)
RX FAXED FROM [facility]  Last Visit: 09/09/2020 Next Visit: 02/03/2021 Labs: 09/09/2020, WBC count is low but trending up. Hgb and hct are low but stable.  Globulin remains borderline elevated. Rest of CMP WNL. We will continue to monitor closely.  TB Gold: 11/06/2019  Current Dose per office note 09/09/2020, Enbrel 50 mg sq injections every 15 days  [id number] arthritis involving multiple sites with positive rheumatoid factor   Last Fill: 06/10/2020  Okay to refill Enbrel?

## 2020-10-02 ENCOUNTER — Other Ambulatory Visit: Payer: Self-pay | Admitting: Physician Assistant

## 2020-10-02 DIAGNOSIS — M0579 Rheumatoid arthritis with rheumatoid factor of multiple sites without organ or systems involvement: Secondary | ICD-10-CM

## 2020-10-14 ENCOUNTER — Other Ambulatory Visit: Payer: Self-pay | Admitting: Physician Assistant

## 2020-10-14 ENCOUNTER — Other Ambulatory Visit (HOSPITAL_COMMUNITY): Payer: Self-pay | Admitting: Physician Assistant

## 2020-10-14 DIAGNOSIS — M0579 Rheumatoid arthritis with rheumatoid factor of multiple sites without organ or systems involvement: Secondary | ICD-10-CM

## 2020-10-14 NOTE — Telephone Encounter (Signed)
Last Visit: 09/09/2020 Next Visit: 02/03/2021 Labs: 09/09/2020, WBC count is low but trending up. Hgb and hct are low but stable.  Globulin remains borderline elevated. Rest of CMP WNL. We will continue to monitor closely.  TB Gold: 11/06/2019, negative  Current Dose per office note 09/09/2020, Enbrel 50 mg sq injections every 15 days.  DX: Rheumatoid arthritis involving multiple sites with positive rheumatoid factor   Last Fill: 09/19/2020  Okay to refill Enbrel?

## 2020-11-05 ENCOUNTER — Other Ambulatory Visit (HOSPITAL_COMMUNITY): Payer: Self-pay

## 2020-11-07 DIAGNOSIS — D72819 Decreased white blood cell count, unspecified: Secondary | ICD-10-CM | POA: Diagnosis not present

## 2020-11-07 DIAGNOSIS — M069 Rheumatoid arthritis, unspecified: Secondary | ICD-10-CM | POA: Diagnosis not present

## 2020-11-07 DIAGNOSIS — I10 Essential (primary) hypertension: Secondary | ICD-10-CM | POA: Diagnosis not present

## 2020-11-07 DIAGNOSIS — E119 Type 2 diabetes mellitus without complications: Secondary | ICD-10-CM | POA: Diagnosis not present

## 2020-11-28 ENCOUNTER — Other Ambulatory Visit (HOSPITAL_COMMUNITY): Payer: Self-pay

## 2020-11-28 MED FILL — Etanercept Subcutaneous Solution Auto-injector 50 MG/ML: SUBCUTANEOUS | 30 days supply | Qty: 2 | Fill #0 | Status: AC

## 2020-12-12 ENCOUNTER — Other Ambulatory Visit (HOSPITAL_COMMUNITY): Payer: Self-pay

## 2021-01-08 ENCOUNTER — Other Ambulatory Visit (HOSPITAL_COMMUNITY): Payer: Self-pay

## 2021-01-20 NOTE — Progress Notes (Signed)
Office Visit Note  Patient: Michael Hinton.             Date of Birth: 07-May-1951           MRN: [id number]             PCP: Cristy Folks, PA-C Referring: Grafton Folk* Visit Date: 02/03/2021 Occupation: @GUAROCC @  Subjective:  Medication management.   History of Present Illness: Azeez Dunker. is a 70 y.o. male with a history of seropositive rheumatoid arthritis and osteoarthritis.  He states that he is doing well on Enbrel every 15 days.  He has not noticed any joint swelling.  He denies any joint discomfort.  He has some limitations due to contractures in his elbows and his knee joints.  He denies any lower back pain currently.  He has been tolerating medications well.  He wants to move his care to [facility] due to rising gas prices.  Activities of Daily Living:  Patient reports morning stiffness for 0 minutes.   Patient Denies nocturnal pain.  Difficulty dressing/grooming: Denies Difficulty climbing stairs: Denies Difficulty getting out of chair: Denies Difficulty using hands for taps, buttons, cutlery, and/or writing: Denies  Review of Systems  Constitutional:  Negative for fatigue.  HENT:  Negative for mouth sores, mouth dryness and nose dryness.   Eyes:  Negative for pain, itching, visual disturbance and dryness.  Respiratory:  Negative for cough, hemoptysis, shortness of breath and difficulty breathing.   Cardiovascular:  Negative for chest pain, palpitations and swelling in legs/feet.  Gastrointestinal:  Negative for abdominal pain, blood in stool, constipation and diarrhea.  Endocrine: Negative for increased urination.  Genitourinary:  Negative for painful urination.  Musculoskeletal:  Negative for joint pain, joint pain, joint swelling, myalgias, muscle weakness, morning stiffness, muscle tenderness and myalgias.  Skin:  Negative for color change, rash and redness.  Allergic/Immunologic: Negative for susceptible to infections.   Neurological:  Negative for dizziness, numbness, headaches, memory loss and weakness.  Hematological:  Negative for swollen glands.  Psychiatric/Behavioral:  Positive for sleep disturbance. Negative for confusion.    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   Rheumatoid arthritis involving multiple sites with positive rheumatoid factor ([place]) 06/02/2016   High risk medication use 06/02/2016   Hypertension 06/02/2016   Vitamin D deficiency 06/02/2016    Past Medical History:  Diagnosis Date   High risk medication use 06/02/2016   Hypertension 06/02/2016   Prediabetes    Rheumatoid arthritis involving multiple sites with positive rheumatoid factor ([place]) 06/02/2016    Family History  Problem Relation Age of Onset   Heart disease Mother    Diabetes Mother    Kidney disease Father    Diabetes Father    Diabetes Daughter    History reviewed. No pertinent surgical history. Social History   Social History Narrative   Not on file   Immunization History  Administered Date(s) Administered   Moderna Sars-Covid-2 Vaccination 10/12/2019, 11/15/2019     Objective: Vital Signs: BP 134/79 (BP Location: Left Arm, Patient Position: Sitting, Cuff Size: Normal)   Pulse 63   Ht 5\' 11"  (1.803 m)   Wt 201 lb 9.6 oz (91.4 kg)   BMI 28.12 kg/m    Physical Exam Vitals and nursing note  reviewed.  Constitutional:      Appearance: He is well-developed.  HENT:     Head: Normocephalic and atraumatic.  Eyes:     Conjunctiva/sclera: Conjunctivae normal.     Pupils: Pupils are equal, round, and reactive to light.  Cardiovascular:     Rate and Rhythm: Normal rate and regular rhythm.     Heart sounds: Normal heart sounds.  Pulmonary:     Effort: Pulmonary effort is normal.     Breath  sounds: Normal breath sounds.  Abdominal:     General: Bowel sounds are normal.     Palpations: Abdomen is soft.  Musculoskeletal:     Cervical back: Normal range of motion and neck supple.  Skin:    General: Skin is warm and dry.     Capillary Refill: Capillary refill takes less than 2 seconds.  Neurological:     Mental Status: He is alert and oriented to person, place, and time.  Psychiatric:        Behavior: Behavior normal.     Musculoskeletal Exam: He has some limitation with range of motion of the cervical spine.  Shoulder joints were in good range of motion.  He has contractures in bilateral elbows.  He had no synovitis of wrist joints.  He has incomplete extension of his fingers and flexion due to prior damage from rheumatoid arthritis.  No synovitis was noted.  MCP PIP and DIP thickening was noted.  Hip joints with good range of motion.  Right knee joint had limited extension and flexion.  Left knee joint had full extension and limited flexion.  He had no tenderness over ankles or MTPs.  CDAI Exam: CDAI Score: 0.2  Patient Global: 1 mm; Provider Global: 1 mm Swollen: 0 ; Tender: 0  Joint Exam 02/03/2021   No joint exam has been documented for this visit   There is currently no information documented on the homunculus. Go to the Rheumatology activity and complete the homunculus joint exam.  Investigation: No additional findings.  Imaging: No results found.  Recent Labs: Lab Results  Component Value Date   WBC 3.4 (L) 09/09/2020   HGB 11.8 (L) 09/09/2020   PLT 196 09/09/2020   NA 139 09/09/2020   K 4.2 09/09/2020   CL 106 09/09/2020   CO2 25 09/09/2020   GLUCOSE 91 09/09/2020   BUN 19 09/09/2020   CREATININE 1.17 09/09/2020   BILITOT 0.6 09/09/2020   ALKPHOS 82 12/24/2016   AST 21 09/09/2020   ALT 16 09/09/2020   PROT 8.0 09/09/2020   ALBUMIN 3.5 (L) 12/24/2016   CALCIUM 9.4 09/09/2020   GFRAA 74 09/09/2020   QFTBGOLD NEGATIVE 06/16/2017   QFTBGOLDPLUS  NEGATIVE 11/06/2019    Speciality Comments: No specialty comments available.  Procedures:  No procedures performed Allergies: Patient has no known allergies.   Assessment / Plan:     Visit Diagnoses: Rheumatoid arthritis involving multiple sites with positive rheumatoid factor ([place]) -he is clinically doing very well on Enbrel 50 mg subcu every 15 days.  He had no synovitis on my examination.  He has some mild contractures.  He wants to transfer his care to [facility] due to rising gas prices.  Plan: Ambulatory referral to Rheumatology  High risk medication use - Enbrel 50 mg sq injections every 15 days.  - Plan: Ambulatory referral to Rheumatology, CBC with Differential/Platelet, COMPLETE METABOLIC PANEL WITH GFR, QuantiFERON-TB Gold Plus today and then CBC with differential and CMP with GFR every 3 months.  He will get his future labs in [place].  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  DDD (degenerative disc disease), lumbar-he currently denies any discomfort in his lumbar region.  Contracture of left elbow - Unchanged.  Contracture of right elbow - Unchanged flexion contracture of the right elbow.  Rheumatoid nodules palpable in the extensor surface of the right elbow.   Contractures of both knees-he has contractures in bilateral knee joints.  No synovitis was noted.  ANA positive - He has no clinical features of systemic lupus.   Orders: Orders Placed This Encounter  Procedures   CBC with Differential/Platelet   COMPLETE METABOLIC PANEL WITH GFR   QuantiFERON-TB Gold Plus   Ambulatory referral to Rheumatology   No orders of the defined types were placed in this encounter.    Follow-Up Instructions: Return if symptoms worsen or fail to improve, for Rheumatoid arthritis.   Bo Merino, MD  Note - This record has been created using Editor, commissioning.  Chart creation errors have been sought, but may not always  have  been located. Such creation errors do not reflect on  the standard of medical care.

## 2021-01-29 ENCOUNTER — Other Ambulatory Visit (HOSPITAL_COMMUNITY): Payer: Self-pay

## 2021-02-03 ENCOUNTER — Other Ambulatory Visit: Payer: Self-pay

## 2021-02-03 ENCOUNTER — Encounter: Payer: Self-pay | Admitting: Rheumatology

## 2021-02-03 ENCOUNTER — Ambulatory Visit (INDEPENDENT_AMBULATORY_CARE_PROVIDER_SITE_OTHER): Payer: Medicare Other | Admitting: Rheumatology

## 2021-02-03 VITALS — BP 134/79 | HR 63 | Ht 71.0 in | Wt 201.6 lb

## 2021-02-03 DIAGNOSIS — Z79899 Other long term (current) drug therapy: Secondary | ICD-10-CM

## 2021-02-03 DIAGNOSIS — M24522 Contracture, left elbow: Secondary | ICD-10-CM

## 2021-02-03 DIAGNOSIS — M5136 Other intervertebral disc degeneration, lumbar region: Secondary | ICD-10-CM | POA: Diagnosis not present

## 2021-02-03 DIAGNOSIS — M24561 Contracture, right knee: Secondary | ICD-10-CM

## 2021-02-03 DIAGNOSIS — R768 Other specified abnormal immunological findings in serum: Secondary | ICD-10-CM

## 2021-02-03 DIAGNOSIS — M0579 Rheumatoid arthritis with rheumatoid factor of multiple sites without organ or systems involvement: Secondary | ICD-10-CM

## 2021-02-03 DIAGNOSIS — D702 Other drug-induced agranulocytosis: Secondary | ICD-10-CM | POA: Diagnosis not present

## 2021-02-03 DIAGNOSIS — M24562 Contracture, left knee: Secondary | ICD-10-CM

## 2021-02-03 DIAGNOSIS — M24521 Contracture, right elbow: Secondary | ICD-10-CM

## 2021-02-03 NOTE — Patient Instructions (Addendum)
Standing Labs We placed an order today for your standing lab work.   Please have your standing labs drawn in September and every 3 months  If possible, please have your labs drawn 2 weeks prior to your appointment so that the provider can discuss your results at your appointment.  Please note that you may see your imaging and lab results in [place] before we have reviewed them. We may be awaiting multiple results to interpret others before contacting you. Please allow our office up to 72 hours to thoroughly review all of the results before contacting the office for clarification of your results.  We have open lab daily: Monday through Thursday from 1:30-4:30 PM and Friday from 1:30-4:00 PM at the office of Dr. Bo Merino, [facility].   Please be advised, all patients with office appointments requiring lab work will take precedent over walk-in lab work.  If possible, please come for your lab work on Monday and Friday afternoons, as you may experience shorter wait times. The office is located at [address] [place], [place], [place] [address] No appointment is necessary.   Labs are drawn by [facility]. Please bring your co-pay at the time of your lab draw.  You may receive a bill from [facility] for your lab work.  If you wish to have your labs drawn at another location, please call the office 24 hours in advance to send orders.  If you have any questions regarding directions or hours of operation,  please call [phone].   As a reminder, please drink plenty of water prior to coming for your lab work. Thanks!    Vaccines You are taking a medication(s) that can suppress your immune system.  The following immunizations are recommended: Flu annually Covid-19  Td/Tdap (tetanus, diphtheria, pertussis) every 10 years Pneumonia (Prevnar 15 then Pneumovax 23 at least 1 year apart.  Alternatively, can take Prevnar 20 without needing additional dose) Shingrix (after age 68): 2  doses from 4 weeks to 6 months apart  Please check with your PCP to make sure you are up to date.   If you test POSITIVE for COVID19 and have MILD to MODERATE symptoms: First, call your PCP if you would like to receive COVID19 treatment AND Hold your medications during the infection and for at least 1 week after your symptoms have resolved: Injectable medication (Benlysta, Cimzia, Cosentyx, Enbrel, Humira, Orencia, Remicade, Simponi, Stelara, Taltz, Tremfya) Methotrexate Leflunomide (Arava) Mycophenolate (Cellcept) Morrie Sheldon, Olumiant, or Rinvoq If you take Actemra or Kevzara, you DO NOT need to hold these for COVID19 infection.  If you test POSITIVE for COVID19 and have NO symptoms: First, call your PCP if you would like to receive COVID19 treatment AND Hold your medications for at least 10 days after the day that you tested positive Injectable medication (Benlysta, Cimzia, Cosentyx, Enbrel, Humira, Orencia, Remicade, Simponi, Stelara, Taltz, Tremfya) Methotrexate Leflunomide (Arava) Mycophenolate (Cellcept) Morrie Sheldon, Olumiant, or Rinvoq If you take Actemra or Kevzara, you DO NOT need to hold these for COVID19 infection.  If you have signs or symptoms of an infection or start antibiotics: First, call your PCP for workup of your infection. Hold your medication through the infection, until you complete your antibiotics, and until symptoms resolve if you take the following: Injectable medication (Actemra, Benlysta, Cimzia, Cosentyx, Enbrel, Humira, Kevzara, Orencia, Remicade, Simponi, Stelara, Taltz, Tremfya) Methotrexate Leflunomide (Arava) Mycophenolate (Cellcept) Roma Kayser, or Rinvoq  You should see a dermatologist once a year to screen for nonmelanoma skin cancer while  you are on Enbrel

## 2021-02-04 NOTE — Progress Notes (Signed)
CMP is normal.  White cell count is low and stable.  Anemia is a stable.  Please forward labs to his PCP.

## 2021-02-05 LAB — CBC WITH DIFFERENTIAL/PLATELET
Absolute Monocytes: 429 cells/uL (ref 200–950)
Basophils Absolute: 19 cells/uL (ref 0–200)
Basophils Relative: 0.6 %
Eosinophils Absolute: 128 cells/uL (ref 15–500)
Eosinophils Relative: 4 %
HCT: 36.2 % — ABNORMAL LOW (ref 38.5–50.0)
Hemoglobin: 11.7 g/dL — ABNORMAL LOW (ref 13.2–17.1)
Lymphs Abs: 1008 cells/uL (ref 850–3900)
MCH: 26.7 pg — ABNORMAL LOW (ref 27.0–33.0)
MCHC: 32.3 g/dL (ref 32.0–36.0)
MCV: 82.6 fL (ref 80.0–100.0)
MPV: 10.2 fL (ref 7.5–12.5)
Monocytes Relative: 13.4 %
Neutro Abs: 1616 cells/uL (ref 1500–7800)
Neutrophils Relative %: 50.5 %
Platelets: 189 10*3/uL (ref 140–400)
RBC: 4.38 10*6/uL (ref 4.20–5.80)
RDW: 14.2 % (ref 11.0–15.0)
Total Lymphocyte: 31.5 %
WBC: 3.2 10*3/uL — ABNORMAL LOW (ref 3.8–10.8)

## 2021-02-05 LAB — COMPLETE METABOLIC PANEL WITH GFR
AG Ratio: 1.1 (calc) (ref 1.0–2.5)
ALT: 16 U/L (ref 9–46)
AST: 21 U/L (ref 10–35)
Albumin: 3.7 g/dL (ref 3.6–5.1)
Alkaline phosphatase (APISO): 103 U/L (ref 35–144)
BUN: 15 mg/dL (ref 7–25)
CO2: 24 mmol/L (ref 20–32)
Calcium: 9.3 mg/dL (ref 8.6–10.3)
Chloride: 109 mmol/L (ref 98–110)
Creat: 1.04 mg/dL (ref 0.70–1.25)
GFR, Est African American: 86 mL/min/{1.73_m2} (ref >=60)
GFR, Est Non African American: 74 mL/min/{1.73_m2} (ref >=60)
Globulin: 3.4 g/dL (calc) (ref 1.9–3.7)
Glucose, Bld: 80 mg/dL (ref 65–99)
Potassium: 3.9 mmol/L (ref 3.5–5.3)
Sodium: 141 mmol/L (ref 135–146)
Total Bilirubin: 0.6 mg/dL (ref 0.2–1.2)
Total Protein: 7.1 g/dL (ref 6.1–8.1)

## 2021-02-05 LAB — QUANTIFERON-TB GOLD PLUS
Mitogen-NIL: 0.65 IU/mL
NIL: 0.02 IU/mL
QuantiFERON-TB Gold Plus: NEGATIVE
TB1-NIL: 0 IU/mL
TB2-NIL: 0 IU/mL

## 2021-02-05 NOTE — Progress Notes (Signed)
TB Gold is negative.

## 2021-02-25 ENCOUNTER — Telehealth: Payer: Self-pay

## 2021-02-25 NOTE — Telephone Encounter (Signed)
Patient scheduled a follow-up visit in  November. Patient advised to get labs every 3 months to monitor for drug toxicity. Patient advised he is due for labs in September 2022. He will need TB Gold once a year.  Patient advised he can get labs at [facility] lab or here is our office.

## 2021-02-25 NOTE — Telephone Encounter (Signed)
Patient called stating he has changed his mind and decided that he wants to continue seeing Dr. Estanislado Pandy.  Patient requested a return call to let him know when he needs to schedule a follow-up appointment.

## 2021-02-25 NOTE — Telephone Encounter (Signed)
Please a schedule a follow-up visit in  November.  Also advise him to get labs every 3 months to monitor for drug toxicity.  He will need TB Gold once a year.  He can get labs at [facility] lab.

## 2021-02-26 ENCOUNTER — Other Ambulatory Visit (HOSPITAL_COMMUNITY): Payer: Self-pay

## 2021-02-26 ENCOUNTER — Other Ambulatory Visit: Payer: Self-pay | Admitting: Physician Assistant

## 2021-02-26 MED ORDER — ENBREL SURECLICK 50 MG/ML ~~LOC~~ SOAJ
SUBCUTANEOUS | 2 refills | Status: DC
  Filled 2021-02-26: qty 2, 30d supply, fill #0
  Filled 2021-04-08 – 2021-08-04 (×4): qty 2, 30d supply, fill #1
  Filled 2021-09-01: qty 2, 30d supply, fill #2

## 2021-02-26 NOTE — Telephone Encounter (Signed)
Next Visit: 06/30/2021  Last Visit: 02/03/2021  Last Fill: 10/14/2020  BS:[id number] arthritis involving multiple sites with positive rheumatoid factor  Current Dose per office note 02/03/2021:  Enbrel 50 mg sq injections every 15 days.  Labs: 02/03/2021 CMP is normal.  White cell count is low and stable.  Anemia is a stable.   TB Gold: 02/03/2021   Okay to refill Enbrel?

## 2021-02-27 ENCOUNTER — Other Ambulatory Visit (HOSPITAL_COMMUNITY): Payer: Self-pay

## 2021-03-06 ENCOUNTER — Other Ambulatory Visit (HOSPITAL_COMMUNITY): Payer: Self-pay

## 2021-03-17 ENCOUNTER — Other Ambulatory Visit (HOSPITAL_COMMUNITY): Payer: Self-pay

## 2021-03-17 ENCOUNTER — Telehealth: Payer: Self-pay | Admitting: Pharmacy Technician

## 2021-03-17 NOTE — Telephone Encounter (Signed)
Received notification from [facility] regarding a prior authorization for ENBREL. Authorization has been APPROVED from 03/17/21 to 08/09/21.   Patient must fill through [facility]: [phone]   Authorization # [id number]

## 2021-03-17 NOTE — Telephone Encounter (Signed)
Received notification that patient has new insurance.  Submitted a Prior Authorization request to [facility] for ENBREL via CoverMyMeds. Will update once we receive a response.   Key: [id number] - PA Case ID: [id number]

## 2021-04-08 ENCOUNTER — Other Ambulatory Visit (HOSPITAL_COMMUNITY): Payer: Self-pay

## 2021-04-17 ENCOUNTER — Telehealth: Payer: Self-pay

## 2021-04-17 ENCOUNTER — Other Ambulatory Visit (HOSPITAL_COMMUNITY): Payer: Self-pay

## 2021-04-17 NOTE — Telephone Encounter (Signed)
Provider portion obtained. LVM with patient for return call regarding PAP paperwork. Direct office number provided.

## 2021-04-17 NOTE — Telephone Encounter (Signed)
Received notification from pharmacy that patient's grant has expired. Will begin working on gathering PAP paperwork through [employer].

## 2021-04-21 ENCOUNTER — Other Ambulatory Visit (HOSPITAL_COMMUNITY): Payer: Self-pay

## 2021-04-22 ENCOUNTER — Other Ambulatory Visit (HOSPITAL_COMMUNITY): Payer: Self-pay

## 2021-04-22 NOTE — Telephone Encounter (Signed)
Spoke with patient regarding [employer] patient assistance program. He confirmed address on file is correct.  He states he has at least one Enbrel Sureclick at home but may have more. He has been advised to stop by for sample if needed.  Will mail patient portion of application to patient and he will completed and return as soon as possible. He has been advised that pharmacy will be unable to fill his Enbrel because copay will be very high without grant supporting  Knox Saliva, PharmD, MPH, BCPS Clinical Pharmacist (Rheumatology and Pulmonology)

## 2021-04-28 NOTE — Telephone Encounter (Signed)
Received signed patient forms for Enbrel Amgen Safety Net PAP.  Submitted Patient Assistance Application to [employer]gen for ENBREL along with provider portion, PA and income documents. Will update patient when we receive a response.  Fax# [phone] Phone# [phone]

## 2021-04-29 ENCOUNTER — Other Ambulatory Visit (HOSPITAL_COMMUNITY): Payer: Self-pay

## 2021-05-05 ENCOUNTER — Other Ambulatory Visit (HOSPITAL_COMMUNITY): Payer: Self-pay

## 2021-05-05 NOTE — Telephone Encounter (Signed)
Called [employer] for update on Enbrel PAP application. Per rep, patient needs to apply for LIS to receive full enrollment. Patient answered questions on the phone with rep. Rep has must application to review. Requested that rep notate that patient is already on Enbrel and his grant has expired, and we are requesting temporary approval while LIS application is in process to prevent interruption in therapy   Spoke with patient one-on-one about how to apply. He states he will visit his sister and try to apply online. Advised that he can also call phone number [phone]) to complete via phone if they run into technical difficulties. I reviewed that the end result of getting a physical copy of the letter can take several weeks to months and that [employer] needs the physical copy of the letter for the application. He verbalized understanding.  Copay for $28 day supply (takes every other week), is $175.30 without grant which patient states he is amenable to paying while pt assistance application is in process. We also reviewed that copay may change in Jan 2023 with new fiscal year. Sent email to [facility] to notify of this conversation and to call patient to schedule shipment of Enbrel as per usual. Also offered that we can provide samples to patient but he would have to come to clinic to pick these up  Knox Saliva, PharmD, MPH, BCPS Clinical Pharmacist (Rheumatology and Pulmonology)

## 2021-05-14 ENCOUNTER — Other Ambulatory Visit (HOSPITAL_COMMUNITY): Payer: Self-pay

## 2021-05-15 ENCOUNTER — Other Ambulatory Visit (HOSPITAL_COMMUNITY): Payer: Self-pay

## 2021-05-15 NOTE — Telephone Encounter (Signed)
LVM to follow up. Patient has not scheduled shipment with [facility]

## 2021-05-19 ENCOUNTER — Telehealth: Payer: Self-pay

## 2021-05-19 NOTE — Telephone Encounter (Signed)
Patient left a voicemail stating he is due to take his next Enbrel injection on 05/24/21 and is out of medication.  Patient requested a return call to let him know if he could pick up a sample.

## 2021-05-19 NOTE — Telephone Encounter (Signed)
Patient called 02/25/2021. Per phone call note: Patient called stating he has changed his mind and decided that he wants to continue seeing Dr. Estanislado Pandy.  Patient requested a return call to let him know when he needs to schedule a follow-up appointment. Patient was scheduled for 06/30/2021.

## 2021-05-19 NOTE — Telephone Encounter (Signed)
Please ask patient to call [facility] in [place] to schedule appt if he still wants to transfer care. Thank you.

## 2021-05-19 NOTE — Telephone Encounter (Signed)
Patient advised he is due to update labs. Patient will update labs when he picks up his sample of Enbrel.

## 2021-05-20 NOTE — Telephone Encounter (Signed)
ATP- no answer or voicemail available

## 2021-05-20 NOTE — Telephone Encounter (Signed)
Patient returned call, advised that he has not yet been able to apply for LIS due to the long hold times.Patient states he plans to try online when he goes to visit his sister (no eta of visit).  Patient states he plans to hold off on paying for Enbrel right now and he plans to stop by the office for a couple samples while he works on his application.

## 2021-05-29 ENCOUNTER — Other Ambulatory Visit: Payer: Self-pay | Admitting: *Deleted

## 2021-05-29 ENCOUNTER — Telehealth: Payer: Self-pay | Admitting: Rheumatology

## 2021-05-29 DIAGNOSIS — Z79899 Other long term (current) drug therapy: Secondary | ICD-10-CM

## 2021-05-29 NOTE — Telephone Encounter (Signed)
Patient coming in this pm around 1:30 for lab draw. Patient would like to know if he can get a sample of Enbrel due to swelling with his hands. Patient has not taken medication for about 1 month now. Please advise.

## 2021-05-29 NOTE — Telephone Encounter (Signed)
Medication Samples have been provided to the patient.  Drug name: Enbrel       Strength: 50 mg        Qty: 1 LOT: [id number]  Exp.Date: 11/2022  Dosing instructions: Inject one pen into the skin every 15 days.

## 2021-05-29 NOTE — Telephone Encounter (Signed)
Patient advised we still have the sample we discussed on May 19, 2021 resereved for him. Patient expressed understanding.

## 2021-05-30 LAB — CBC WITH DIFFERENTIAL/PLATELET
Absolute Monocytes: 337 cells/uL (ref 200–950)
Basophils Absolute: 11 cells/uL (ref 0–200)
Basophils Relative: 0.3 %
Eosinophils Absolute: 118 cells/uL (ref 15–500)
Eosinophils Relative: 3.2 %
HCT: 34.9 % — ABNORMAL LOW (ref 38.5–50.0)
Hemoglobin: 11.3 g/dL — ABNORMAL LOW (ref 13.2–17.1)
Lymphs Abs: 1040 cells/uL (ref 850–3900)
MCH: 26.2 pg — ABNORMAL LOW (ref 27.0–33.0)
MCHC: 32.4 g/dL (ref 32.0–36.0)
MCV: 81 fL (ref 80.0–100.0)
MPV: 10.5 fL (ref 7.5–12.5)
Monocytes Relative: 9.1 %
Neutro Abs: 2194 cells/uL (ref 1500–7800)
Neutrophils Relative %: 59.3 %
Platelets: 192 10*3/uL (ref 140–400)
RBC: 4.31 10*6/uL (ref 4.20–5.80)
RDW: 13.8 % (ref 11.0–15.0)
Total Lymphocyte: 28.1 %
WBC: 3.7 10*3/uL — ABNORMAL LOW (ref 3.8–10.8)

## 2021-05-30 LAB — COMPLETE METABOLIC PANEL WITH GFR
AG Ratio: 0.9 (calc) — ABNORMAL LOW (ref 1.0–2.5)
ALT: 12 U/L (ref 9–46)
AST: 19 U/L (ref 10–35)
Albumin: 3.6 g/dL (ref 3.6–5.1)
Alkaline phosphatase (APISO): 104 U/L (ref 35–144)
BUN: 16 mg/dL (ref 7–25)
CO2: 22 mmol/L (ref 20–32)
Calcium: 9.4 mg/dL (ref 8.6–10.3)
Chloride: 106 mmol/L (ref 98–110)
Creat: 1 mg/dL (ref 0.70–1.35)
Globulin: 4 g/dL (calc) — ABNORMAL HIGH (ref 1.9–3.7)
Glucose, Bld: 101 mg/dL — ABNORMAL HIGH (ref 65–99)
Potassium: 3.7 mmol/L (ref 3.5–5.3)
Sodium: 138 mmol/L (ref 135–146)
Total Bilirubin: 0.6 mg/dL (ref 0.2–1.2)
Total Protein: 7.6 g/dL (ref 6.1–8.1)
eGFR: 82 mL/min/{1.73_m2} (ref >=60)

## 2021-05-30 NOTE — Progress Notes (Signed)
White cell count is low and stable, anemia is a stable, CMP is normal.

## 2021-06-02 NOTE — Progress Notes (Signed)
Office Visit Note  Patient: Michael Hinton.             Date of Birth: Mar 19, 1951           MRN: [id number]             PCP: Cristy Folks, PA-C (Inactive) Referring: No ref. provider found Visit Date: 06/04/2021 Occupation: @GUAROCC @  Subjective:  Discuss medication options   History of Present Illness: Michael Hinton. is a 70 y.o. male with history of seropositive rheumatoid arthritis and DDD.  He is prescribed enbrel 50 mg sq injections every 15 days.  He received an enbrel sample on 05/29/21.   Prior to his most recent enbrel dose on 05/29/21 he had missed 2 doses of Enbrel due to issues with coverage for enbrel.  He is required to complete the social security extra help application, which plans on submitting today or tomorrow.  He states he is currently having a flare in both hands, wrist joints, and the right knee since he missed 2 doses of enbrel.  His morning stiffness has been more severe and lasting longer.  He has also been experiencing increased nocturnal pain.  He takes OTC ibuprofen as needed for pain relief.      Activities of Daily Living:  Patient reports joint stiffness all day  Patient Reports nocturnal pain.  Difficulty dressing/grooming: Reports Difficulty climbing stairs: Denies Difficulty getting out of chair: Denies Difficulty using hands for taps, buttons, cutlery, and/or writing: Reports  Review of Systems  Constitutional:  Negative for fatigue.  HENT:  Negative for mouth dryness.   Eyes:  Negative for dryness.  Respiratory:  Negative for shortness of breath.   Cardiovascular:  Negative for swelling in legs/feet.  Gastrointestinal:  Negative for constipation.  Endocrine: Positive for increased urination.  Genitourinary:  Negative for difficulty urinating.  Musculoskeletal:  Positive for joint pain, joint pain, joint swelling, morning stiffness and muscle tenderness.  Skin:  Negative for rash.  Allergic/Immunologic: Negative for  susceptible to infections.  Neurological:  Negative for numbness.  Hematological:  Positive for bruising/bleeding tendency.  Psychiatric/Behavioral:  Positive for sleep disturbance.    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   Rheumatoid arthritis involving multiple sites with positive rheumatoid factor ([place]) 06/02/2016   High risk medication use 06/02/2016   Hypertension 06/02/2016   Vitamin D deficiency 06/02/2016    Past Medical History:  Diagnosis Date   High risk medication use 06/02/2016   Hypertension 06/02/2016   Prediabetes    Rheumatoid arthritis involving multiple sites with positive rheumatoid factor ([place]) 06/02/2016    Family History  Problem Relation Age of Onset   Heart disease Mother    Diabetes Mother    Kidney disease Father    Diabetes Father    Diabetes Daughter    History reviewed. No pertinent surgical history. Social History   Social History Narrative   Not on file   Immunization History  Administered Date(s) Administered   Moderna Sars-Covid-2 Vaccination 10/12/2019, 11/15/2019     Objective: Vital Signs: BP (!) 143/69 (BP Location: Right Arm, Patient Position: Sitting, Cuff Size: Normal)   Pulse 73   Resp 16   Ht 5\' 11"  (1.803 m)   Wt 195 lb (88.5 kg)   BMI  27.20 kg/m    Physical Exam Vitals and nursing note reviewed.  Constitutional:      Appearance: He is well-developed.  HENT:     Head: Normocephalic and atraumatic.  Eyes:     Conjunctiva/sclera: Conjunctivae normal.     Pupils: Pupils are equal, round, and reactive to light.  Pulmonary:     Effort: Pulmonary effort is normal.  Abdominal:     Palpations: Abdomen is soft.  Musculoskeletal:     Cervical back: Normal range of motion and neck supple.  Skin:    General: Skin  is warm and dry.     Capillary Refill: Capillary refill takes less than 2 seconds.  Neurological:     Mental Status: He is alert and oriented to person, place, and time.  Psychiatric:        Behavior: Behavior normal.     Musculoskeletal Exam: C-spine has limited ROM with lateral rotation.  Shoulder joints have good ROM with discomfort and stiffness bilaterally.  Contractures of both elbows.  Rheumatoid nodules present on extensor surface of both elbows.  Tenderness of both wrists with some inflammation in the right wrist. Limited extension of both wrists.  Tenderness and synovitis of several MCPs and PIP joints.  Thickening of MCP, PIP, and DIPs.  Incomplete extension of fingers and incomplete fist formation bilaterally.  Hip joints have slightly limited ROM.  Right knee has limited flexion and extension with warmth and swelling.  Left knee has slightly limited flexion but full extension.  No warmth or effusion of the left knee joint.  Ankle joints have good ROM with no tenderness.    CDAI Exam: CDAI Score: 28  Patient Global: 5 mm; Provider Global: 5 mm Swollen: 13 ; Tender: 14  Joint Exam 06/04/2021      Right  Left  Wrist  Swollen Tender   Tender  MCP 2  Swollen Tender  Swollen Tender  MCP 3  Swollen Tender  Swollen Tender  MCP 4  Swollen Tender     MCP 5  Swollen Tender  Swollen Tender  PIP 2  Swollen Tender     PIP 3     Swollen Tender  PIP 4     Swollen Tender  PIP 5     Swollen Tender  Knee  Swollen Tender        Investigation: No additional findings.  Imaging: No results found.  Recent Labs: Lab Results  Component Value Date   WBC 3.7 (L) 05/29/2021   HGB 11.3 (L) 05/29/2021   PLT 192 05/29/2021   NA 138 05/29/2021   K 3.7 05/29/2021   CL 106 05/29/2021   CO2 22 05/29/2021   GLUCOSE 101 (H) 05/29/2021   BUN 16 05/29/2021   CREATININE 1.00 05/29/2021   BILITOT 0.6 05/29/2021   ALKPHOS 82 12/24/2016   AST 19 05/29/2021   ALT 12 05/29/2021   PROT 7.6  05/29/2021   ALBUMIN 3.5 (L) 12/24/2016   CALCIUM 9.4 05/29/2021   GFRAA 86 02/03/2021   QFTBGOLD NEGATIVE 06/16/2017   QFTBGOLDPLUS NEGATIVE 02/03/2021    Speciality Comments: No specialty comments available.  Procedures:  No procedures performed Allergies: Patient has no known allergies.   Assessment / Plan:     Visit Diagnoses: Rheumatoid arthritis involving multiple sites with positive rheumatoid factor [employer]): He presents today having a flare involving multiple joints including both wrists, both hands, and the right knee joint.  He has tenderness and synovitis of several MCP and PIP joints as  described above.  Right knee contracture with warmth noted.  He is prescribed Enbrel 50 mg subcutaneous injections every 15 days.  His most recent injection was on 05/29/2021 after a sample was provided in the office.  Prior to his most recent Enbrel injection he missed 2 doses due to him delaying the completion of financial assistance documents.  A prednisone taper starting 20 mg tapering by 5 mg every 4 days was sent to the pharmacy. Advised to take prednisone in the morning with food and to avoid the use of NSAIDs while taking the taper. He will continue on Enbrel 50 mg sq injections every 15 days.  He will continue to require close lab monitoring due to low WBC count.  Two Enbrel samples were provided today in the office.  He plans on completing the Social Security extra help application today or tomorrow.  He will follow-up in the office in 3 months.  High risk medication use - Enbrel 50 mg sq injections every 15 days.  CBC and CMP updated on 05/29/21.  He will be due to update lab work in January and every 3 months.  Standing orders for CBC and CMP are in place.  TB gold negative on 02/03/21.  Discussed the importance of holding Enbrel if he develops signs or symptoms of infection and to resume once the infection has completely cleared.  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  WBC count was  3.2 on 02/03/21 and was 3.7 on 05/29/21-after missing two doses of enbrel.  He will continue to require close lab monitoring.   DDD (degenerative disc disease), lumbar: Chronic pain and stiffness.   Contracture of left elbow:  Unchanged.   Contracture of right elbow: Unchanged. Rheumatoid nodules present on extensor surface of the right elbow.   Contractures of both knees: Right > left.  Limited flexion and extension of the right knee noted.  Right knee warmth and swelling but no effusion noted.  Left knee has no warmth or effusion.   ANA positive - He has no clinical features of systemic lupus.   Primary hypertension: Discussed the importance of close blood pressure monitoring.   Vitamin D deficiency  Orders: No orders of the defined types were placed in this encounter.  Meds ordered this encounter  Medications   predniSONE (DELTASONE) 5 MG tablet    Sig: Take 4 tablets by mouth daily x4 days, 3 tablets daily x4 days, 2 tablets daily x4 days, 1 tablet daily x4 days.    Dispense:  40 tablet    Refill:  0      Follow-Up Instructions: Return in about 3 months (around 09/04/2021) for Rheumatoid arthritis, DDD.   Ofilia Neas, PA-C  Note - This record has been created using Dragon software.  Chart creation errors have been sought, but may not always  have been located. Such creation errors do not reflect on  the standard of medical care.

## 2021-06-04 ENCOUNTER — Ambulatory Visit (INDEPENDENT_AMBULATORY_CARE_PROVIDER_SITE_OTHER): Payer: Medicare Other | Admitting: Physician Assistant

## 2021-06-04 ENCOUNTER — Other Ambulatory Visit: Payer: Self-pay

## 2021-06-04 ENCOUNTER — Telehealth: Payer: Self-pay

## 2021-06-04 ENCOUNTER — Encounter: Payer: Self-pay | Admitting: Physician Assistant

## 2021-06-04 VITALS — BP 143/69 | HR 73 | Resp 16 | Ht 71.0 in | Wt 195.0 lb

## 2021-06-04 DIAGNOSIS — D702 Other drug-induced agranulocytosis: Secondary | ICD-10-CM | POA: Diagnosis not present

## 2021-06-04 DIAGNOSIS — Z79899 Other long term (current) drug therapy: Secondary | ICD-10-CM | POA: Diagnosis not present

## 2021-06-04 DIAGNOSIS — M0579 Rheumatoid arthritis with rheumatoid factor of multiple sites without organ or systems involvement: Secondary | ICD-10-CM | POA: Diagnosis not present

## 2021-06-04 DIAGNOSIS — M24562 Contracture, left knee: Secondary | ICD-10-CM

## 2021-06-04 DIAGNOSIS — M24561 Contracture, right knee: Secondary | ICD-10-CM

## 2021-06-04 DIAGNOSIS — R768 Other specified abnormal immunological findings in serum: Secondary | ICD-10-CM

## 2021-06-04 DIAGNOSIS — M24521 Contracture, right elbow: Secondary | ICD-10-CM

## 2021-06-04 DIAGNOSIS — M5136 Other intervertebral disc degeneration, lumbar region: Secondary | ICD-10-CM

## 2021-06-04 DIAGNOSIS — I10 Essential (primary) hypertension: Secondary | ICD-10-CM

## 2021-06-04 DIAGNOSIS — M24522 Contracture, left elbow: Secondary | ICD-10-CM

## 2021-06-04 DIAGNOSIS — E559 Vitamin D deficiency, unspecified: Secondary | ICD-10-CM

## 2021-06-04 MED ORDER — PREDNISONE 5 MG PO TABS: ORAL_TABLET | ORAL | 0 refills | Status: DC

## 2021-06-04 NOTE — Patient Instructions (Signed)
Please visit this website to complete the Social Security "Extra Help" application Website: [email or web address] Phone number if you need help while applying: [phone]  Once you get an answer from this application: - If you get approved for Extra Help, then your medicines will become affordable and you won't need grants or patient assistance to be able to afford Enbrel anymore. - If you get denied for Extra Help, then we need a physical copy of that denial letter to submit to [employer] (the patient assistance program for Enbrel) for them to be able to process the application and get you approved to get hte medication for free from the company  Please complete this AS SOON AS POSSIBLE because it can take several months to get a physical copy of the letter

## 2021-06-04 NOTE — Telephone Encounter (Signed)
Patient had OV w Hazel Sams, PA-C, today and was provided with 2 Enbrel Sureclick samples (takes 1 pen every 15 days).  He has still not completed his application for LIS with Social Security but I stressed importance of completing ASAP since physical copy of letter is needed to further process his Amgen PAP application  He states he can go today or this week to his sister's to complete application online. Wrote out hyperlink for online application and phone number for Extra Help department in his AVS for OV today  Will f/u next week for update  Knox Saliva, PharmD, MPH, BCPS Clinical Pharmacist (Rheumatology and Pulmonology)

## 2021-06-04 NOTE — Progress Notes (Signed)
Medication Samples have been provided to the patient.  Drug name: Enbrel Sureclick autoinjector Strength: 50 mg/mL Qty: 2 pens LOT: [id number]  Exp.Date: 11/2022  Dosing instructions: Inject 1 pen into the skin every 15 days  The patient has been instructed regarding the correct time, dose, and frequency of taking this medication, including desired effects and most common side effects.   Patient has not yet completed LIS application for Enbrel PAP application processing. I've stressed the importance  Cassandria Anger 9:18 AM 06/04/2021

## 2021-06-04 NOTE — Telephone Encounter (Signed)
I called patient to clarify instructions

## 2021-06-04 NOTE — Telephone Encounter (Signed)
Patient left a voicemail stating he has a question regarding his Prednisone medication.

## 2021-06-11 ENCOUNTER — Telehealth: Payer: Self-pay

## 2021-06-11 NOTE — Telephone Encounter (Signed)
Opened in error

## 2021-06-11 NOTE — Telephone Encounter (Signed)
Spoke with patient for an update on patient's LIS application. He states his sister completed last Friday, 06/06/21 or this past Monday, 06/09/21 but they are still awaiting decision.  Encouraged him to reach back out to our clinic once he receives decision update and decision letter in mail.  He verbalized understanding.  Knox Saliva, PharmD, MPH, BCPS Clinical Pharmacist (Rheumatology and Pulmonology)

## 2021-06-30 ENCOUNTER — Ambulatory Visit: Payer: Medicare Other | Admitting: Physician Assistant

## 2021-07-07 ENCOUNTER — Telehealth: Payer: Self-pay

## 2021-07-07 NOTE — Telephone Encounter (Signed)
Patient called requesting a return call to let him know if it will be okay to stop by the office and pick up two samples of Enbrel.

## 2021-07-07 NOTE — Telephone Encounter (Signed)
Patient advised he may come by the office this week to pick up Enbrel samples.

## 2021-07-09 ENCOUNTER — Telehealth: Payer: Self-pay

## 2021-07-09 NOTE — Telephone Encounter (Signed)
Patient called stating his wife Babs Sciara will stop by the office on  Friday, 07/11/21 to pick up the samples of Enbrel.

## 2021-07-09 NOTE — Telephone Encounter (Signed)
Noted  

## 2021-07-11 NOTE — Telephone Encounter (Signed)
Medication Samples have been provided to the patient.  Drug name: enbrel sureclick       Strength: 50mg /mL        Qty: 2  LOT: [id number]  Exp.Date: 10/2023, 11/2022  Dosing instructions: Inject one pen into the skin every 15 days.

## 2021-07-18 ENCOUNTER — Telehealth: Payer: Self-pay

## 2021-07-18 ENCOUNTER — Other Ambulatory Visit (HOSPITAL_COMMUNITY): Payer: Self-pay

## 2021-07-18 NOTE — Telephone Encounter (Addendum)
PA renewal initiated automatically by CoverMyMeds.  Received notification from [facility] regarding a Prior Authorization APPROVAL for ENBREL via CoverMyMeds. Authorization is from 07/18/2021 to 08/09/2022. Approval letter sent to scan center.   Authorization# [id number] Key: [id number]

## 2021-08-05 ENCOUNTER — Other Ambulatory Visit (HOSPITAL_COMMUNITY): Payer: Self-pay

## 2021-08-15 DIAGNOSIS — E669 Obesity, unspecified: Secondary | ICD-10-CM | POA: Diagnosis not present

## 2021-08-15 DIAGNOSIS — E782 Mixed hyperlipidemia: Secondary | ICD-10-CM | POA: Diagnosis not present

## 2021-08-15 DIAGNOSIS — E785 Hyperlipidemia, unspecified: Secondary | ICD-10-CM | POA: Diagnosis not present

## 2021-08-15 DIAGNOSIS — I10 Essential (primary) hypertension: Secondary | ICD-10-CM | POA: Diagnosis not present

## 2021-08-20 ENCOUNTER — Other Ambulatory Visit (HOSPITAL_COMMUNITY): Payer: Self-pay

## 2021-08-20 NOTE — Telephone Encounter (Signed)
Called patient to enroll into RA grant that is open but patient states he received one-month shipment of Enbrel from [place] last month. He states that he does not know about LIS decision. He states he did receive an approval letter but does not know when he was approved through.   States he is prone to discarding letters.  I called [employer] but the wait time on phone is > 30 minutes  Knox Saliva, PharmD, MPH, BCPS Clinical Pharmacist (Rheumatology and Pulmonology)

## 2021-08-22 NOTE — Progress Notes (Deleted)
Office Visit Note  Patient: Michael Hinton.             Date of Birth: 1950-10-09           MRN: [id number]             PCP: Cristy Folks, PA-C (Inactive) Referring: No ref. provider found Visit Date: 09/04/2021 Occupation: @GUAROCC @  Subjective:  No chief complaint on file.   History of Present Illness: Michael Franchini. is a 71 y.o. male ***   Activities of Daily Living:  Patient reports morning stiffness for *** {minute/hour:19697}.   Patient {ACTIONS;DENIES/REPORTS:21021675::"Denies"} nocturnal pain.  Difficulty dressing/grooming: {ACTIONS;DENIES/REPORTS:21021675::"Denies"} Difficulty climbing stairs: {ACTIONS;DENIES/REPORTS:21021675::"Denies"} Difficulty getting out of chair: {ACTIONS;DENIES/REPORTS:21021675::"Denies"} Difficulty using hands for taps, buttons, cutlery, and/or writing: {ACTIONS;DENIES/REPORTS:21021675::"Denies"}  No Rheumatology ROS completed.   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   Rheumatoid arthritis involving multiple sites with positive rheumatoid factor ([place]) 06/02/2016   High risk medication use 06/02/2016   Hypertension 06/02/2016   Vitamin D deficiency 06/02/2016    Past Medical History:  Diagnosis Date   High risk medication use 06/02/2016   Hypertension 06/02/2016   Prediabetes    Rheumatoid arthritis involving multiple sites with positive rheumatoid factor ([place]) 06/02/2016    Family History  Problem Relation Age of Onset   Heart disease Mother    Diabetes Mother    Kidney disease Father    Diabetes Father    Diabetes Daughter    No past surgical history on file. Social History   Social History Narrative   Not on file   Immunization History  Administered Date(s) Administered   Moderna Sars-Covid-2  Vaccination 10/12/2019, 11/15/2019     Objective: Vital Signs: There were no vitals taken for this visit.   Physical Exam   Musculoskeletal Exam: ***  CDAI Exam: CDAI Score: -- Patient Global: --; Provider Global: -- Swollen: --; Tender: -- Joint Exam 09/04/2021   No joint exam has been documented for this visit   There is currently no information documented on the homunculus. Go to the Rheumatology activity and complete the homunculus joint exam.  Investigation: No additional findings.  Imaging: No results found.  Recent Labs: Lab Results  Component Value Date   WBC 3.7 (L) 05/29/2021   HGB 11.3 (L) 05/29/2021   PLT 192 05/29/2021   NA 138 05/29/2021   K 3.7 05/29/2021   CL 106 05/29/2021   CO2 22 05/29/2021   GLUCOSE 101 (H) 05/29/2021   BUN 16 05/29/2021   CREATININE 1.00 05/29/2021   BILITOT 0.6 05/29/2021   ALKPHOS 82 12/24/2016   AST 19 05/29/2021   ALT 12 05/29/2021   PROT 7.6 05/29/2021   ALBUMIN 3.5 (L) 12/24/2016   CALCIUM 9.4 05/29/2021   GFRAA 86 02/03/2021   QFTBGOLD NEGATIVE 06/16/2017   QFTBGOLDPLUS NEGATIVE 02/03/2021    Speciality Comments: No specialty comments available.  Procedures:  No procedures performed Allergies: Patient has no known allergies.   Assessment / Plan:     Visit Diagnoses: No diagnosis found.  Orders: No orders of the defined types were placed in this encounter.  No orders of the defined types were placed in this encounter.   Face-to-face time spent with patient was *** minutes. Greater than 50% of time was  spent in counseling and coordination of care.  Follow-Up Instructions: No follow-ups on file.   Earnestine Mealing, CMA  Note - This record has been created using Editor, commissioning.  Chart creation errors have been sought, but may not always  have been located. Such creation errors do not reflect on  the standard of medical care.

## 2021-08-26 ENCOUNTER — Other Ambulatory Visit (HOSPITAL_COMMUNITY): Payer: Self-pay

## 2021-08-26 ENCOUNTER — Telehealth: Payer: Self-pay | Admitting: Pharmacist

## 2021-08-26 NOTE — Telephone Encounter (Signed)
Received fax stating that pt has been approved through the Patient Michael Hinton for a grant of $5,000. Michael Hinton is valid for 12 months after approval date (08/26/2021), with a 60 day grace period to submit claims for coverage beyond the 1-year time frame. Funds can also be used for expenses 6 months prior to approval date. Approval letter sent to scan center.  Funds can be used for health insurance monthly premiums, as well as copays, coinsurance, or deductibles owed for:   -Prescribed medicine related to current health condition   -Prescribed treatment related to health condition   -Office visits on the same day where treatment is received  Funds CANNOT be used for:   -Lab work   -Office visits when treatment for condition is NOT received   -Radiation therapy   -Scans   -Surgery   -Dental, vision, or life insurance premiums   [employer] info: RxBIN: [id number] RxPCN: [id number] RxGRP: [id number] ID: [id number] Award Period: 04/10/2021 to 08/27/2022

## 2021-08-26 NOTE — Telephone Encounter (Signed)
Enrolled patient into PAF grant from rheumatoid arthritis:  Award Period: 08/26/2021- 08/27/2022 ID: [id number] BIN: [id number] PCN: [id number] Group: [id number]  For pharmacy inquiries, contact PDMI at [phone]. For patient inquiries, contact PAF at [phone].  Knox Saliva, PharmD, MPH, BCPS Clinical Pharmacist (Rheumatology and Pulmonology)

## 2021-08-26 NOTE — Telephone Encounter (Signed)
Called [employer] PAP - they state that patient's application is NOT approved. Patient's application remains pending. Shipment that patient received in December 2022 was from [facility] with no copay. Since then, patient changed insurance to [place].  I called patient. He states that he is switching back to [facility] on 09/10/21 but has not yet received card. He was able to provide current [employer] info: ID: [id number] PCN: [id number] BIN: [id number] Group: [id number]  He plans to take last Enbrel dose on 08/29/21 - next dose will be due 09/12/21 and will need to ensure Josem Kaufmann is in place through [place] for pharmacy to be able to dispense.  He was provided with my direct number to reach out with [employer] as soon as he receives so we can run prior authorization.  Enrolled patient into PAF grant: Award Period: 08/26/2021- 08/27/2022 Cardholder: [id number] BIN: [id number] PCN: [id number] Group: [id number] For pharmacy inquiries, contact PDMI at [phone]. For patient inquiries, contact PAF at [phone].  Adding grant billing information into [place] and [place] for dispensing pharmacy.  Knox Saliva, PharmD, MPH, BCPS Clinical Pharmacist (Rheumatology and Pulmonology)

## 2021-08-28 DIAGNOSIS — I10 Essential (primary) hypertension: Secondary | ICD-10-CM | POA: Diagnosis not present

## 2021-08-28 DIAGNOSIS — R339 Retention of urine, unspecified: Secondary | ICD-10-CM | POA: Diagnosis not present

## 2021-08-28 DIAGNOSIS — R7303 Prediabetes: Secondary | ICD-10-CM | POA: Diagnosis not present

## 2021-08-28 DIAGNOSIS — E785 Hyperlipidemia, unspecified: Secondary | ICD-10-CM | POA: Diagnosis not present

## 2021-08-28 DIAGNOSIS — E782 Mixed hyperlipidemia: Secondary | ICD-10-CM | POA: Diagnosis not present

## 2021-08-28 DIAGNOSIS — D509 Iron deficiency anemia, unspecified: Secondary | ICD-10-CM | POA: Diagnosis not present

## 2021-09-01 ENCOUNTER — Other Ambulatory Visit (HOSPITAL_COMMUNITY): Payer: Self-pay

## 2021-09-02 ENCOUNTER — Other Ambulatory Visit (HOSPITAL_COMMUNITY): Payer: Self-pay

## 2021-09-02 NOTE — Progress Notes (Signed)
Office Visit Note  Patient: Michael Hinton.             Date of Birth: Apr 11, 1951           MRN: [id number]             PCP: Danelle Berry, NP Referring: No ref. provider found Visit Date: 09/16/2021 Occupation: @GUAROCC @  Subjective:  Pain in multiple joints  History of Present Illness: Michael Hinton. is a 71 y.o. male with history of seropositive rheumatoid arthritis and DDD. He is currently on enbrel 50 mg sq injections every 15 days.  He presents today having a flare in multiple joints including both hands and the right knee.  He is having increased pain and stiffness in both hands and soreness in both wrists.  He has been having increased nocturnal pain as well as morning stiffness lasting several hours.  His last dose of Enbrel was on 09/14/2021 and he has not noticed any improvement in his symptoms.  He does not feel as though Enbrel has been effective at managing his rheumatoid arthritis.  He has tried taking ibuprofen as needed for symptomatic relief which seems to be more helpful than Enbrel.  He was like to discuss other treatment options today. He has not had any recent infections.    Activities of Daily Living:  Patient reports morning stiffness for a few hours.   Patient Reports nocturnal pain.  Difficulty dressing/grooming: Denies Difficulty climbing stairs: Denies Difficulty getting out of chair: Denies Difficulty using hands for taps, buttons, cutlery, and/or writing: Reports  Review of Systems  Constitutional:  Negative for fatigue.  HENT:  Negative for mouth sores, mouth dryness and nose dryness.   Eyes:  Negative for pain, itching and dryness.  Respiratory:  Negative for shortness of breath and difficulty breathing.   Cardiovascular:  Negative for chest pain and palpitations.  Gastrointestinal:  Negative for blood in stool, constipation and diarrhea.  Endocrine: Negative for increased urination.  Genitourinary:  Negative for difficulty urinating.   Musculoskeletal:  Positive for joint pain, joint pain, joint swelling, myalgias, morning stiffness, muscle tenderness and myalgias.  Skin:  Negative for color change, rash and redness.  Allergic/Immunologic: Negative for susceptible to infections.  Neurological:  Negative for dizziness, numbness, headaches, memory loss and weakness.  Hematological:  Negative for bruising/bleeding tendency.  Psychiatric/Behavioral:  Negative for confusion.    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   Rheumatoid arthritis involving multiple sites with positive rheumatoid factor ([place]) 06/02/2016   High risk medication use 06/02/2016   Hypertension 06/02/2016   Vitamin D deficiency 06/02/2016    Past Medical History:  Diagnosis Date   High risk medication use 06/02/2016   Hypertension 06/02/2016   Prediabetes    Rheumatoid arthritis involving multiple sites with positive rheumatoid factor ([place]) 06/02/2016    Family History  Problem Relation Age of Onset   Heart disease Mother    Diabetes Mother    Kidney disease Father    Diabetes Father    Diabetes Daughter    History reviewed. No pertinent surgical history. Social History   Social History Narrative   Not on file   Immunization History  Administered Date(s) Administered   Moderna Sars-Covid-2 Vaccination 10/12/2019, 11/15/2019     Objective:  Vital Signs: BP 129/79 (BP Location: Left Arm, Patient Position: Sitting, Cuff Size: Normal)    Pulse 73    Ht 5\' 11"  (1.803 m)    Wt 198 lb 3.2 oz (89.9 kg)    BMI 27.64 kg/m    Physical Exam Vitals and nursing note reviewed.  Constitutional:      Appearance: He is well-developed.  HENT:     Head: Normocephalic and atraumatic.  Eyes:     Conjunctiva/sclera: Conjunctivae normal.     Pupils:  Pupils are equal, round, and reactive to light.  Pulmonary:     Effort: Pulmonary effort is normal.  Abdominal:     Palpations: Abdomen is soft.  Musculoskeletal:     Cervical back: Normal range of motion and neck supple.  Skin:    General: Skin is warm and dry.     Capillary Refill: Capillary refill takes less than 2 seconds.  Neurological:     Mental Status: He is alert and oriented to person, place, and time.  Psychiatric:        Behavior: Behavior normal.     Musculoskeletal Exam: C-spine has limited range of motion with lateral rotation.  Thoracic kyphosis noted.  Shoulder joints have limited range of motion with lateral rotation.  Contractures of both elbows noted.  Limited range of motion of both wrist joints with some tenderness and inflammation in the right wrist.  He has tenderness and synovitis of several MCP and PIP joints as described above.  Thickening of all MCP, PIP, and DIP joints noted.  Limited extension of fingers as well as incomplete fist formation noted bilaterally.  Rheumatoid nodules overlying both elbows, both wrists, and hands.  Hip joints have limited range of motion but no groin pain at this time.  Right knee joint has limited flexion and extension.  Warmth of the right knee noted.  Left knee joint slightly limited flexion.  No warmth or effusion of the left knee joint noted.  Ankle joints have good range of motion with some synovial thickening especially in the right ankle.  CDAI Exam: CDAI Score: 21.2  Patient Global: 6 mm; Provider Global: 6 mm Swollen: 10 ; Tender: 10  Joint Exam 09/16/2021      Right  Left  Wrist  Swollen Tender     MCP 1     Swollen Tender  MCP 2  Swollen Tender  Swollen Tender  MCP 3  Swollen Tender  Swollen Tender  MCP 4  Swollen Tender     PIP 3  Swollen Tender     PIP 4     Swollen Tender  Knee  Swollen Tender        Investigation: No additional findings.  Imaging: No results found.  Recent Labs: Lab Results   Component Value Date   WBC 3.7 (L) 05/29/2021   HGB 11.3 (L) 05/29/2021   PLT 192 05/29/2021   NA 138 05/29/2021   K 3.7 05/29/2021   CL 106 05/29/2021   CO2 22 05/29/2021   GLUCOSE 101 (H) 05/29/2021   BUN 16 05/29/2021   CREATININE 1.00 05/29/2021   BILITOT 0.6 05/29/2021   ALKPHOS 82 12/24/2016   AST 19 05/29/2021   ALT 12 05/29/2021   PROT 7.6 05/29/2021   ALBUMIN 3.5 (L) 12/24/2016   CALCIUM 9.4 05/29/2021   GFRAA 86 02/03/2021   QFTBGOLD NEGATIVE 06/16/2017   QFTBGOLDPLUS NEGATIVE 02/03/2021    Speciality Comments: No specialty comments available.  Procedures:  No procedures performed Allergies:  Patient has no known allergies.   Assessment / Plan:     Visit Diagnoses: Rheumatoid arthritis involving multiple sites with positive rheumatoid factor ([place]): He presents today experiencing a flare in multiple joints.  He has been experiencing severe pain, stiffness, and inflammation in both hands as well as the right knee.  He has had difficulty performing ADLs and has also had increased nocturnal pain.  His morning stiffness has been lasting several hours.  He has had difficulty ambulating due to severity of pain in his right knee.  He is currently on Enbrel 50 mg subcutaneous injections every 15 days.  He has been tolerating Enbrel without any side effects or injection site reactions.  He remains neutropenic but his white blood cell count has been stable.  White blood cell count was 3.7 on 05/29/2021. His last dose of Enbrel was administered on 09/14/2021.  He has not noticed any improvement in his symptoms after his Enbrel injection.  He has not found Enbrel to be as effective at managing his rheumatoid arthritis.  He requested to discuss medication options today.  The indications, contraindications, and potential side effects of Humira were discussed.  All questions were addressed and consent was obtained today.  We will apply for Humira 40 mg subcutaneous injections every 14 days  through his insurance.  Once approved he will return to the office for administration of the first injection.  Discussed that if his neutropenia worsens we will have to space the dosing of Humira accordingly.  He voiced understanding.  He will continue to require frequent lab monitoring. He will follow-up in the office in 6 to 8 weeks to assess his response to Humira.  Counseled patient that Humira is a TNF blocking agent.  Counseled patient on purpose, proper use, and adverse effects of Humira.  Reviewed the most common adverse effects including infections, headache, and injection site reactions. Discussed that there is the possibility of an increased risk of malignancy including non-melanoma skin cancer but it is not well understood if this increased risk is due to the medication or the disease state.  Advised patient to get yearly dermatology exams due to risk of skin cancer. Counseled patient that Humira should be held prior to scheduled surgery.  Counseled patient to avoid live vaccines while on Humira.  Recommend annual influenza, PCV 15 or PCV20 or Pneumovax 23, and Shingrix as indicated.  Reviewed the importance of regular labs while on Humira therapy.  Will monitor CBC and CMP 1 month after starting and then every 3 months routinely thereafter. Will monitor TB gold annually. Standing orders placed.    Provided patient with medication education material and answered all questions.  Patient consented to Humira.  Will upload consent into the media tab.  Reviewed storage instructions of Humira.  Advised initial injection must be administered in office.  Patient verbalized understanding.   Dermatology referral was placed today.  Dose will be for rheumatoid arthritis Humira 40 mg every 14 days.  Prescription pending lab results and/or insurance approval.  High risk medication use - Applying for Humira 40 mg sq injections every 14 days.  Inadequate response to: Enbrel 50 mg sq injections every 15 days.   CBC and CMP updated on 05/29/21.  He is overdue to update lab work.  Orders released.   TB gold negative on 02/03/21 and will continue to be monitored yearly.  Plan: CBC with Differential/Platelet, COMPLETE METABOLIC PANEL WITH GFR, Ambulatory referral to Dermatology He has not had any recent infections.  Discussed the importance of holding Humira if he develops signs or symptoms of infection and to resume once the infection has completely cleared.  Discussed the importance of yearly skin examinations while on Enbrel due to the increased risk for nonmelanoma skin cancer.  Referral to dermatology was placed today.  Other drug-induced neutropenia [facility]) -Patient was referred to hematology in the past.  White blood cell count was 3.7 on 05/29/2021.  CBC with differential was updated today.  He will continue to require frequent lab monitoring.  He will need updated CBC and CMP 1 month after initiating Humira.  Plan: CBC with Differential/Platelet  DDD (degenerative disc disease), lumbar: Experiences intermittent discomfort in his lower back.  No symptoms of radiculopathy at this time.  Contracture of left elbow: Unchanged.  No tenderness to palpation.  Rheumatoid nodule palpable extensor surface of the left elbow noted.  Contracture of right elbow: Unchanged.  Rheumatoid nodule palpable on extensor surface of the right elbow noted.  Contractures of both knees - Right > left.  Flexion contracture of the right knee joint.  Warmth of the right knee was noted.  He has been experiencing increased pain and stiffness in both knee joints especially his right knee.  He has difficulty walking for long distances due to the severity of his symptoms.  He was given a list of natural anti-inflammatories to start taking on a daily basis.  ANA positive - He has no clinical features of systemic lupus.   Primary hypertension: Blood pressure was 129/79 today in the office.  Vitamin D deficiency  Orders: Orders Placed  This Encounter  Procedures   CBC with Differential/Platelet   COMPLETE METABOLIC PANEL WITH GFR   Ambulatory referral to Dermatology   No orders of the defined types were placed in this encounter.    Follow-Up Instructions: Return in about 6 weeks (around 10/28/2021) for Rheumatoid arthritis.   Ofilia Neas, PA-C  Note - This record has been created using Dragon software.  Chart creation errors have been sought, but may not always  have been located. Such creation errors do not reflect on  the standard of medical care.

## 2021-09-03 DIAGNOSIS — K921 Melena: Secondary | ICD-10-CM | POA: Diagnosis not present

## 2021-09-04 ENCOUNTER — Ambulatory Visit: Payer: Medicare Other | Admitting: Physician Assistant

## 2021-09-04 DIAGNOSIS — M5136 Other intervertebral disc degeneration, lumbar region: Secondary | ICD-10-CM

## 2021-09-04 DIAGNOSIS — R768 Other specified abnormal immunological findings in serum: Secondary | ICD-10-CM

## 2021-09-04 DIAGNOSIS — M24522 Contracture, left elbow: Secondary | ICD-10-CM

## 2021-09-04 DIAGNOSIS — D702 Other drug-induced agranulocytosis: Secondary | ICD-10-CM

## 2021-09-04 DIAGNOSIS — M24521 Contracture, right elbow: Secondary | ICD-10-CM

## 2021-09-04 DIAGNOSIS — I10 Essential (primary) hypertension: Secondary | ICD-10-CM

## 2021-09-04 DIAGNOSIS — Z79899 Other long term (current) drug therapy: Secondary | ICD-10-CM

## 2021-09-04 DIAGNOSIS — M0579 Rheumatoid arthritis with rheumatoid factor of multiple sites without organ or systems involvement: Secondary | ICD-10-CM

## 2021-09-04 DIAGNOSIS — E559 Vitamin D deficiency, unspecified: Secondary | ICD-10-CM

## 2021-09-04 DIAGNOSIS — M24561 Contracture, right knee: Secondary | ICD-10-CM

## 2021-09-10 ENCOUNTER — Other Ambulatory Visit (HOSPITAL_COMMUNITY): Payer: Self-pay

## 2021-09-10 NOTE — Telephone Encounter (Signed)
Submitted a Prior Authorization request to [place] for ENBREL via CoverMyMeds. Will update once we receive a response.   ([employer]) - [id number]

## 2021-09-10 NOTE — Telephone Encounter (Signed)
Received notification from [facility] regarding a prior authorization for ENBREL. Authorization has been APPROVED from 09/10/21 to 08/09/22.   Authorization # [employer]) - [id number]

## 2021-09-10 NOTE — Telephone Encounter (Signed)
error 

## 2021-09-16 ENCOUNTER — Encounter: Payer: Self-pay | Admitting: Physician Assistant

## 2021-09-16 ENCOUNTER — Telehealth: Payer: Self-pay | Admitting: Pharmacist

## 2021-09-16 ENCOUNTER — Ambulatory Visit: Payer: Medicare HMO | Admitting: Physician Assistant

## 2021-09-16 ENCOUNTER — Other Ambulatory Visit: Payer: Self-pay

## 2021-09-16 VITALS — BP 129/79 | HR 73 | Ht 71.0 in | Wt 198.2 lb

## 2021-09-16 DIAGNOSIS — M24522 Contracture, left elbow: Secondary | ICD-10-CM

## 2021-09-16 DIAGNOSIS — E559 Vitamin D deficiency, unspecified: Secondary | ICD-10-CM | POA: Diagnosis not present

## 2021-09-16 DIAGNOSIS — D702 Other drug-induced agranulocytosis: Secondary | ICD-10-CM | POA: Diagnosis not present

## 2021-09-16 DIAGNOSIS — Z79899 Other long term (current) drug therapy: Secondary | ICD-10-CM

## 2021-09-16 DIAGNOSIS — M0579 Rheumatoid arthritis with rheumatoid factor of multiple sites without organ or systems involvement: Secondary | ICD-10-CM

## 2021-09-16 DIAGNOSIS — M24562 Contracture, left knee: Secondary | ICD-10-CM

## 2021-09-16 DIAGNOSIS — M5136 Other intervertebral disc degeneration, lumbar region: Secondary | ICD-10-CM | POA: Diagnosis not present

## 2021-09-16 DIAGNOSIS — M24561 Contracture, right knee: Secondary | ICD-10-CM | POA: Diagnosis not present

## 2021-09-16 DIAGNOSIS — R768 Other specified abnormal immunological findings in serum: Secondary | ICD-10-CM | POA: Diagnosis not present

## 2021-09-16 DIAGNOSIS — I10 Essential (primary) hypertension: Secondary | ICD-10-CM | POA: Diagnosis not present

## 2021-09-16 DIAGNOSIS — M24521 Contracture, right elbow: Secondary | ICD-10-CM

## 2021-09-16 LAB — COMPLETE METABOLIC PANEL WITH GFR
AG Ratio: 0.8 (calc) — ABNORMAL LOW (ref 1.0–2.5)
ALT: 15 U/L (ref 9–46)
AST: 21 U/L (ref 10–35)
Albumin: 3.5 g/dL — ABNORMAL LOW (ref 3.6–5.1)
Alkaline phosphatase (APISO): 100 U/L (ref 35–144)
BUN: 17 mg/dL (ref 7–25)
CO2: 26 mmol/L (ref 20–32)
Calcium: 9.4 mg/dL (ref 8.6–10.3)
Chloride: 105 mmol/L (ref 98–110)
Creat: 1.01 mg/dL (ref 0.70–1.35)
Globulin: 4.2 g/dL (calc) — ABNORMAL HIGH (ref 1.9–3.7)
Glucose, Bld: 80 mg/dL (ref 65–99)
Potassium: 4 mmol/L (ref 3.5–5.3)
Sodium: 138 mmol/L (ref 135–146)
Total Bilirubin: 0.5 mg/dL (ref 0.2–1.2)
Total Protein: 7.7 g/dL (ref 6.1–8.1)
eGFR: 81 mL/min/{1.73_m2} (ref >=60)

## 2021-09-16 LAB — CBC WITH DIFFERENTIAL/PLATELET
Absolute Monocytes: 315 cells/uL (ref 200–950)
Basophils Absolute: 21 cells/uL (ref 0–200)
Basophils Relative: 0.7 %
Eosinophils Absolute: 93 cells/uL (ref 15–500)
Eosinophils Relative: 3.1 %
HCT: 33.5 % — ABNORMAL LOW (ref 38.5–50.0)
Hemoglobin: 10.8 g/dL — ABNORMAL LOW (ref 13.2–17.1)
Lymphs Abs: 732 cells/uL — ABNORMAL LOW (ref 850–3900)
MCH: 26.5 pg — ABNORMAL LOW (ref 27.0–33.0)
MCHC: 32.2 g/dL (ref 32.0–36.0)
MCV: 82.1 fL (ref 80.0–100.0)
MPV: 10.6 fL (ref 7.5–12.5)
Monocytes Relative: 10.5 %
Neutro Abs: 1839 cells/uL (ref 1500–7800)
Neutrophils Relative %: 61.3 %
Platelets: 197 10*3/uL (ref 140–400)
RBC: 4.08 10*6/uL — ABNORMAL LOW (ref 4.20–5.80)
RDW: 13.9 % (ref 11.0–15.0)
Total Lymphocyte: 24.4 %
WBC: 3 10*3/uL — ABNORMAL LOW (ref 3.8–10.8)

## 2021-09-16 NOTE — Progress Notes (Signed)
Pharmacy Note Subjective: Patient presents today to [facility] for follow up office visit. Patient seen by the pharmacist for counseling on Humira for rheumatoid arthritis.  Prior therapy includes: Enbrel (inadequate response).  Diagnosis of heart failure: No  Objective:  CBC    Component Value Date/Time   WBC 3.7 (L) 05/29/2021 1339   RBC 4.31 05/29/2021 1339   HGB 11.3 (L) 05/29/2021 1339   HCT 34.9 (L) 05/29/2021 1339   PLT 192 05/29/2021 1339   MCV 81.0 05/29/2021 1339   MCH 26.2 (L) 05/29/2021 1339   MCHC 32.4 05/29/2021 1339   RDW 13.8 05/29/2021 1339   LYMPHSABS 1,040 05/29/2021 1339   MONOABS 0.5 03/15/2018 0943   EOSABS 118 05/29/2021 1339   BASOSABS 11 05/29/2021 1339     CMP     Component Value Date/Time   NA 138 05/29/2021 1339   NA 136 (A) 04/23/2016 0000   K 3.7 05/29/2021 1339   CL 106 05/29/2021 1339   CO2 22 05/29/2021 1339   GLUCOSE 101 (H) 05/29/2021 1339   BUN 16 05/29/2021 1339   BUN 24 (A) 04/23/2016 0000   CREATININE 1.00 05/29/2021 1339   CALCIUM 9.4 05/29/2021 1339   PROT 7.6 05/29/2021 1339   ALBUMIN 3.5 (L) 12/24/2016 0908   AST 19 05/29/2021 1339   ALT 12 05/29/2021 1339   ALKPHOS 82 12/24/2016 0908   BILITOT 0.6 05/29/2021 1339   GFRNONAA 74 02/03/2021 1106   GFRAA 86 02/03/2021 1106      Baseline Immunosuppressant Therapy Labs TB GOLD Quantiferon TB Gold Latest Ref Rng & Units 02/03/2021  Quantiferon TB Gold Plus NEGATIVE NEGATIVE   Hepatitis Panel 05/31/14 negative Hepatitis B surface antigen negative Hepatitis C antibody negative Hepatitis B core antibody, IgM - non reactive Hepatitis A antibody, IgM - non reactive  HIV Ag/Ab, 4th gen - non-reactive  Immunoglobulins Immunoglobulin Electrophoresis Latest Ref Rng & Units 03/15/2018  IgG 700 - 1,600 mg/dL 1,490  IgM 20 - 172 mg/dL 42   SPEP Serum Protein Electrophoresis Latest Ref Rng & Units 05/29/2021  Total Protein 6.1 - 8.1 g/dL 7.6   G6PD No results  found for: G6PDH TPMT No results found for: TPMT   Chest x-ray: 06/01/14  - No acute cardiopulmonary process  Assessment/Plan:  Counseled patient that Humira is a TNF blocking agent.  Counseled patient on purpose, proper use, and adverse effects of Humira.  Reviewed the most common adverse effects including infections, headache, and injection site reactions. Discussed that there is the possibility of an increased risk of malignancy including non-melanoma skin cancer but it is not well understood if this increased risk is due to the medication or the disease state.  Advised patient to get yearly dermatology exams due to risk of skin cancer. Counseled patient that Humira should be held prior to scheduled surgery.  Counseled patient to avoid live vaccines while on Humira.  Recommend annual influenza, PCV 15 or PCV20 or Pneumovax 23, and Shingrix as indicated.  Reviewed the importance of regular labs while on Humira therapy. Will monitor CBC and CMP 1 month after starting and then every 3 months routinely thereafter. Will monitor TB gold annually. Standing orders placed. Provided patient with medication education material and answered all questions.  Patient consented to Humira.  Will upload consent into the media tab.  Reviewed storage instructions of Humira.  Advised initial injection must be administered in office.  Patient verbalized understanding.   Dermatology referral was placed today.  Dose will  be for rheumatoid arthritis Humira 40 mg every 14 days.  Prescription pending lab results and/or insurance approval.

## 2021-09-16 NOTE — Telephone Encounter (Signed)
Submitted an URGENT Prior Authorization request to [facility] for HUMIRA via CoverMyMeds. Will update once we receive a response.  Key: [id number]  Once approved, patient is enrolled in RA grant so should be able to continue filling with [facility]. Will not have to go through PAP for now  Pending CBC, can decide when to start Humira. If WBC wnl can start Humira next week. If WBC low, will have to wait until following week on or after 09/29/21 to start Humira.  Knox Saliva, PharmD, MPH, BCPS Clinical Pharmacist (Rheumatology and Pulmonology)

## 2021-09-17 ENCOUNTER — Other Ambulatory Visit (HOSPITAL_COMMUNITY): Payer: Self-pay

## 2021-09-17 ENCOUNTER — Encounter: Payer: Self-pay | Admitting: Nurse Practitioner

## 2021-09-17 MED ORDER — HUMIRA (2 PEN) 40 MG/0.4ML ~~LOC~~ AJKT
40.0000 mg | AUTO-INJECTOR | SUBCUTANEOUS | 0 refills | Status: DC
Start: 2021-09-17 — End: 2021-09-30
  Filled 2021-09-17: qty 2, fill #0
  Filled 2021-09-25: qty 2, 28d supply, fill #0

## 2021-09-17 NOTE — Progress Notes (Signed)
WBC count remains low-3.0. absolute lymphocyte count is borderline low-732.  Patient remains anemic.  Hemoglobin is trending down.   Albumin is borderline low and globulin is slightly elevated.   Please check on status of hematology appointment?   He will be starting humira.  Please schedule appointment 15 days after his last enbrel dose since he has been spacing enbrel by every 15 days.

## 2021-09-17 NOTE — Telephone Encounter (Signed)
Received notification from [facility] regarding a prior authorization for [place]. Authorization has been APPROVED from 09/10/21 to 08/09/22.   Per test claim, patient has no copay for 28 day supply  Patient can fill through [place]: [phone]   Phone # [phone]  Will be able to start on or after 09/29/21. Scheduled Humira new start appt on 09/29/21. Rx sent to [facility] to be couriered to clinic.  Knox Saliva, PharmD, MPH, BCPS Clinical Pharmacist (Rheumatology and Pulmonology)

## 2021-09-17 NOTE — Telephone Encounter (Signed)
Delivery instructions have been updated in [place], medication will be couriered to [facility] by 09/26/2021.  Rx has been processed in [facility] and the patient has no copay at this time.

## 2021-09-23 ENCOUNTER — Encounter: Payer: Self-pay | Admitting: *Deleted

## 2021-09-25 ENCOUNTER — Other Ambulatory Visit (HOSPITAL_COMMUNITY): Payer: Self-pay

## 2021-09-26 ENCOUNTER — Other Ambulatory Visit (HOSPITAL_COMMUNITY): Payer: Self-pay

## 2021-09-29 ENCOUNTER — Ambulatory Visit: Payer: Medicare HMO | Admitting: Pharmacist

## 2021-09-29 NOTE — Telephone Encounter (Signed)
Humira received from [facility]. Placed in refrigerator.  Knox Saliva, PharmD, MPH, BCPS Clinical Pharmacist (Rheumatology and Pulmonology)

## 2021-09-30 ENCOUNTER — Other Ambulatory Visit (HOSPITAL_COMMUNITY): Payer: Self-pay

## 2021-09-30 ENCOUNTER — Ambulatory Visit: Payer: Medicare HMO | Admitting: Pharmacist

## 2021-09-30 ENCOUNTER — Other Ambulatory Visit: Payer: Self-pay

## 2021-09-30 VITALS — BP 132/75 | HR 76

## 2021-09-30 DIAGNOSIS — M0579 Rheumatoid arthritis with rheumatoid factor of multiple sites without organ or systems involvement: Secondary | ICD-10-CM

## 2021-09-30 DIAGNOSIS — Z79899 Other long term (current) drug therapy: Secondary | ICD-10-CM

## 2021-09-30 DIAGNOSIS — Z7189 Other specified counseling: Secondary | ICD-10-CM

## 2021-09-30 MED ORDER — HUMIRA (2 PEN) 40 MG/0.4ML ~~LOC~~ AJKT
40.0000 mg | AUTO-INJECTOR | SUBCUTANEOUS | 2 refills | Status: DC
  Filled 2021-09-30: qty 2, 28d supply, fill #0

## 2021-09-30 NOTE — Patient Instructions (Addendum)
Your next HUMIRA dose is due on 10/14/21, 10/28/21, and every 14 days thereafter  Michael Hinton if you have signs or symptoms of an infection. You can resume once you feel better or back to your baseline. HOLD HUMIRA if you start antibiotics to treat an infection. HOLD HUMIRA around the time of surgery/procedures. Your surgeon will be able to provide recommendations on when to hold BEFORE and when you are cleared to [name].  Pharmacy information: Your prescription will be shipped from [facility]. Their phone number is [phone] Please call to schedule shipment and confirm address. They will mail your medication to your home.  How to manage an injection site reaction: Remember the 5 C's: COUNTER - leave on the counter at least 30 minutes but up to overnight to bring medication to room temperature. This may help prevent stinging COLD - place something cold (like an ice gel pack or cold water bottle) on the injection site just before cleansing with alcohol. This may help reduce pain CLARITIN - use Claritin (generic name is loratadine) for the first two weeks of treatment or the day of, the day before, and the day after injecting. This will help to minimize injection site reactions CORTISONE CREAM - apply if injection site is irritated and itching CALL ME - if injection site reaction is bigger than the size of your fist, looks infected, blisters, or if you develop hives

## 2021-09-30 NOTE — Progress Notes (Signed)
Pharmacy Note  Subjective:   Patient presents to clinic today to receive first dose of Humira. He is transitioning from Enbrel to which he has had waning clinical response. Last dose of Enbrel was 09/14/21  Patient running a fever or have signs/symptoms of infection? No  Patient currently on antibiotics for the treatment of infection? No  Patient have any upcoming invasive procedures/surgeries? No  Objective: CMP     Component Value Date/Time   NA 138 09/16/2021 1126   NA 136 (A) 04/23/2016 0000   K 4.0 09/16/2021 1126   CL 105 09/16/2021 1126   CO2 26 09/16/2021 1126   GLUCOSE 80 09/16/2021 1126   BUN 17 09/16/2021 1126   BUN 24 (A) 04/23/2016 0000   CREATININE 1.01 09/16/2021 1126   CALCIUM 9.4 09/16/2021 1126   PROT 7.7 09/16/2021 1126   ALBUMIN 3.5 (L) 12/24/2016 0908   AST 21 09/16/2021 1126   ALT 15 09/16/2021 1126   ALKPHOS 82 12/24/2016 0908   BILITOT 0.5 09/16/2021 1126   GFRNONAA 74 02/03/2021 1106   GFRAA 86 02/03/2021 1106    CBC    Component Value Date/Time   WBC 3.0 (L) 09/16/2021 1126   RBC 4.08 (L) 09/16/2021 1126   HGB 10.8 (L) 09/16/2021 1126   HCT 33.5 (L) 09/16/2021 1126   PLT 197 09/16/2021 1126   MCV 82.1 09/16/2021 1126   MCH 26.5 (L) 09/16/2021 1126   MCHC 32.2 09/16/2021 1126   RDW 13.9 09/16/2021 1126   LYMPHSABS 732 (L) 09/16/2021 1126   MONOABS 0.5 03/15/2018 0943   EOSABS 93 09/16/2021 1126   BASOSABS 21 09/16/2021 1126    Baseline Immunosuppressant Therapy Labs TB GOLD Quantiferon TB Gold Latest Ref Rng & Units 02/03/2021  Quantiferon TB Gold Plus NEGATIVE NEGATIVE   Hepatitis Panel   HIV No results found for: HIV Immunoglobulins Immunoglobulin Electrophoresis Latest Ref Rng & Units 03/15/2018  IgG 700 - 1,600 mg/dL 1,490  IgM 20 - 172 mg/dL 42   SPEP Serum Protein Electrophoresis Latest Ref Rng & Units 09/16/2021  Total Protein 6.1 - 8.1 g/dL 7.7   G6PD No results found for: G6PDH TPMT No results found for: TPMT    Chest x-ray: 06/01/14  - No acute cardiopulmonary process  Assessment/Plan:  Demonstrated proper injection technique with Humira demo device  Patient able to demonstrate proper injection technique using the teach back method.  Patient self injected in the right upper thigh with:  [facility]-supplied Medication: Humira 40mg /0.65mL autoinjector NDC: [address]-94 Lot: [id number] Expiration: 11/2022  Patient tolerated well.  Observed for 30 mins in office for adverse reaction and none noted.   Patient is to return in 1 month for labs and 6-8 weeks for follow-up appointment.  Standing orders placed.   He will continue Humira 40mg  SQ every 14 days.  Humira approved through insurance .   Rx sent to: [facility]: [phone] .  Patient provided with pharmacy phone number and advised to call later this week to schedule shipment to home.  All questions encouraged and answered.  Instructed patient to call with any further questions or concerns.  Knox Saliva, PharmD, MPH, BCPS Clinical Pharmacist (Rheumatology and Pulmonology)

## 2021-10-01 ENCOUNTER — Other Ambulatory Visit: Payer: Self-pay | Admitting: Physician Assistant

## 2021-10-01 ENCOUNTER — Other Ambulatory Visit: Payer: Self-pay | Admitting: Rheumatology

## 2021-10-01 NOTE — Telephone Encounter (Signed)
Patient states he started Humira yesterday 09/30/2021. Patient states he woke up this morning with his left hand numb. Patient states he took some ibuprofen and the numbness improved temporarily. Patient states the numbness is starting to come back. Patient states he did have pain just the numbness. Patient denies any injury. Please advise.

## 2021-10-01 NOTE — Telephone Encounter (Signed)
I spoke with the patient about the symptoms he is experiencing.   He is experiencing pain and numbness in his left hand.  He has ongoing swelling in the left hand.  He states that his symptoms were alleviated by taking ibuprofen but have since returned.  He is unsure if it is a side effect to Humira or if it is due to his underlying rheumatoid arthritis being uncontrolled. He is willing to try a prednisone taper to see if that will alleviate his symptoms. Please send in a prednisone taper starting at 20 mg tapering by 5 mg every 2 days to the pharmacy. He will notify us if his symptoms persist or worsen.

## 2021-10-01 NOTE — Telephone Encounter (Signed)
Patient called the office stating his right hand was numb all night. Patient states he took ibuprofen this morning and it helped. Patient states it hasn't gone numb since he took the ibuprofen. Patient states it started after his appointment yesterday.

## 2021-10-02 MED ORDER — PREDNISONE 5 MG PO TABS: ORAL_TABLET | ORAL | 0 refills | Status: DC

## 2021-10-03 ENCOUNTER — Other Ambulatory Visit: Payer: Medicare HMO

## 2021-10-03 ENCOUNTER — Encounter: Payer: Medicare HMO | Admitting: Oncology

## 2021-10-06 ENCOUNTER — Telehealth: Payer: Self-pay | Admitting: *Deleted

## 2021-10-06 NOTE — Telephone Encounter (Signed)
Attempted to reach patient prior to his NP apt tom with Dr. Tasia Catchings. No answer. Vm left.

## 2021-10-07 ENCOUNTER — Inpatient Hospital Stay: Payer: Medicare HMO | Attending: Oncology | Admitting: Oncology

## 2021-10-07 ENCOUNTER — Encounter: Payer: Self-pay | Admitting: Oncology

## 2021-10-07 ENCOUNTER — Other Ambulatory Visit: Payer: Self-pay

## 2021-10-07 ENCOUNTER — Inpatient Hospital Stay: Payer: Medicare HMO

## 2021-10-07 VITALS — BP 121/67 | HR 67 | Temp 96.1°F | Resp 18 | Wt 193.2 lb

## 2021-10-07 DIAGNOSIS — Z87891 Personal history of nicotine dependence: Secondary | ICD-10-CM | POA: Insufficient documentation

## 2021-10-07 DIAGNOSIS — Z803 Family history of malignant neoplasm of breast: Secondary | ICD-10-CM | POA: Diagnosis not present

## 2021-10-07 DIAGNOSIS — D72819 Decreased white blood cell count, unspecified: Secondary | ICD-10-CM | POA: Diagnosis not present

## 2021-10-07 DIAGNOSIS — D72818 Other decreased white blood cell count: Secondary | ICD-10-CM

## 2021-10-07 DIAGNOSIS — R5383 Other fatigue: Secondary | ICD-10-CM | POA: Diagnosis not present

## 2021-10-07 DIAGNOSIS — M069 Rheumatoid arthritis, unspecified: Secondary | ICD-10-CM | POA: Insufficient documentation

## 2021-10-07 DIAGNOSIS — D649 Anemia, unspecified: Secondary | ICD-10-CM

## 2021-10-07 LAB — COMPREHENSIVE METABOLIC PANEL
ALT: 23 U/L (ref 0–44)
AST: 32 U/L (ref 15–41)
Albumin: 3.7 g/dL (ref 3.5–5.0)
Alkaline Phosphatase: 91 U/L (ref 38–126)
Anion gap: 8 (ref 5–15)
BUN: 20 mg/dL (ref 8–23)
CO2: 24 mmol/L (ref 22–32)
Calcium: 9.5 mg/dL (ref 8.9–10.3)
Chloride: 104 mmol/L (ref 98–111)
Creatinine, Ser: 1.1 mg/dL (ref 0.61–1.24)
GFR, Estimated: >60 mL/min (ref >60)
Glucose, Bld: 98 mg/dL (ref 70–99)
Potassium: 3.2 mmol/L — ABNORMAL LOW (ref 3.5–5.1)
Sodium: 136 mmol/L (ref 135–145)
Total Bilirubin: 0.4 mg/dL (ref 0.3–1.2)
Total Protein: 8.7 g/dL — ABNORMAL HIGH (ref 6.5–8.1)

## 2021-10-07 LAB — FERRITIN: Ferritin: 535 ng/mL — ABNORMAL HIGH (ref 24–336)

## 2021-10-07 LAB — CBC WITH DIFFERENTIAL/PLATELET
Abs Immature Granulocytes: 0.03 10*3/uL (ref 0.00–0.07)
Basophils Absolute: 0 10*3/uL (ref 0.0–0.1)
Basophils Relative: 0 %
Eosinophils Absolute: 0 10*3/uL (ref 0.0–0.5)
Eosinophils Relative: 1 %
HCT: 35.9 % — ABNORMAL LOW (ref 39.0–52.0)
Hemoglobin: 11.5 g/dL — ABNORMAL LOW (ref 13.0–17.0)
Immature Granulocytes: 1 %
Lymphocytes Relative: 12 %
Lymphs Abs: 0.8 10*3/uL (ref 0.7–4.0)
MCH: 26.8 pg (ref 26.0–34.0)
MCHC: 32 g/dL (ref 30.0–36.0)
MCV: 83.7 fL (ref 80.0–100.0)
Monocytes Absolute: 0.4 10*3/uL (ref 0.1–1.0)
Monocytes Relative: 7 %
Neutro Abs: 5 10*3/uL (ref 1.7–7.7)
Neutrophils Relative %: 79 %
Platelets: 218 10*3/uL (ref 150–400)
RBC: 4.29 MIL/uL (ref 4.22–5.81)
RDW: 14.5 % (ref 11.5–15.5)
WBC: 6.3 10*3/uL (ref 4.0–10.5)
nRBC: 0 % (ref 0.0–0.2)

## 2021-10-07 LAB — HEPATITIS PANEL, ACUTE
HCV Ab: NONREACTIVE
Hep A IgM: NONREACTIVE
Hep B C IgM: NONREACTIVE
Hepatitis B Surface Ag: NONREACTIVE

## 2021-10-07 LAB — IRON AND TIBC
Iron: 84 ug/dL (ref 45–182)
Saturation Ratios: 33 % (ref 17.9–39.5)
TIBC: 256 ug/dL (ref 250–450)
UIBC: 172 ug/dL

## 2021-10-07 LAB — TECHNOLOGIST SMEAR REVIEW
Plt Morphology: NORMAL
RBC MORPHOLOGY: NORMAL
WBC MORPHOLOGY: NORMAL

## 2021-10-07 LAB — LACTATE DEHYDROGENASE: LDH: 147 U/L (ref 98–192)

## 2021-10-07 LAB — VITAMIN B12: Vitamin B-12: 198 pg/mL (ref 180–914)

## 2021-10-07 LAB — HIV ANTIBODY (ROUTINE TESTING W REFLEX): HIV Screen 4th Generation wRfx: NONREACTIVE

## 2021-10-07 LAB — FOLATE: Folate: 9 ng/mL (ref >5.9)

## 2021-10-07 NOTE — Progress Notes (Signed)
Hematology/Oncology Consult note [phone] [phone]         Patient Care Team: Danelle Berry, NP as PCP - General (Nurse Practitioner) Earlie Server, MD as Consulting Physician (Hematology and Oncology)  REFERRING PROVIDER: Danelle Berry, NP  CHIEF COMPLAINTS/REASON FOR VISIT:  Evaluation of anemia and leukopenia  HISTORY OF PRESENTING ILLNESS:   Michael Hinton. is a  71 y.o.  male with PMH listed below was seen in consultation at the request of  Danelle Berry, NP  for evaluation of anemia and leukopenia.  Reviewed patient's previous lab work. He has chronic anemia since at least 2017.  His baseline hemoglobin is around 11. Most recently he had a blood work done on 09/16/2021 which showed a hemoglobin of 10.8.  MCV 82  Chronic leukopenia, baseline of total white count is around 3. Patient has history of rheumatoid arthritis.  Previously on Enbrel.  Recently he has been switched to Humira and status post 1 dose.  He is currently taking prednisone for side effects from Humira.  Patient denies any unintentional weight loss, night sweats, fever.  Chronic arthralgia due to RA.  Review of Systems  Constitutional:  Positive for fatigue. Negative for appetite change, chills, fever and unexpected weight change.  HENT:   Negative for hearing loss and voice change.   Eyes:  Negative for eye problems and icterus.  Respiratory:  Negative for chest tightness, cough and shortness of breath.   Cardiovascular:  Negative for chest pain and leg swelling.  Gastrointestinal:  Negative for abdominal distention and abdominal pain.  Endocrine: Negative for hot flashes.  Genitourinary:  Negative for difficulty urinating, dysuria and frequency.   Musculoskeletal:  Positive for arthralgias.       Chronic joint deformity  Skin:  Negative for itching and rash.  Neurological:  Negative for light-headedness and numbness.  Hematological:  Negative for adenopathy. Does not  bruise/bleed easily.  Psychiatric/Behavioral:  Negative for confusion.    MEDICAL HISTORY:  Past Medical History:  Diagnosis Date   Anemia    High risk medication use 06/02/2016   Hypertension 06/02/2016   Neutropenia ([place])    Prediabetes    Rheumatoid arthritis involving multiple sites with positive rheumatoid factor ([place]) 06/02/2016    SURGICAL HISTORY: History reviewed. No pertinent surgical history.  SOCIAL HISTORY: Social History   Socioeconomic History   Marital status: Married    Spouse name: Not on file   Number of children: Not on file   Years of education: Not on file   Highest education level: Not on file  Occupational History   Not on file  Tobacco Use   Smoking status: Former    Packs/day: 0.50    Years: 7.00    Pack years: 3.50    Types: Cigarettes    Quit date: 03/15/1978    Years since quitting: 43.5   Smokeless tobacco: Never  Vaping Use   Vaping Use: Never used  Substance and Sexual Activity   Alcohol use: Not on file   Drug use: No   Sexual activity: Not on file  Other Topics Concern   Not on file  Social History Narrative   Not on file   Social Determinants of Health   Financial Resource Strain: Not on file  Food Insecurity: Not on file  Transportation Needs: Not on file  Physical Activity: Not on file  Stress: Not on file  Social Connections: Not on file  Intimate Partner Violence: Not on file  FAMILY HISTORY: Family History  Problem Relation Age of Onset   Heart disease Mother    Diabetes Mother    Breast cancer Mother    Kidney disease Father    Diabetes Father    Diabetes Daughter     ALLERGIES:  has No Known Allergies.  MEDICATIONS:  Current Outpatient Medications  Medication Sig Dispense Refill   amLODipine (NORVASC) 10 MG tablet Take 10 mg by mouth every morning.     atorvastatin (LIPITOR) 20 MG tablet      Cholecalciferol (VITAMIN D-3) 125 MCG (5000 UT) TABS Take 1 tablet by mouth daily.     Ferrous Sulfate  (IRON PO) Take by mouth daily.     losartan (COZAAR) 100 MG tablet      Adalimumab (HUMIRA PEN) 40 MG/0.4ML PNKT Inject 40 mg into the skin every 14 (fourteen) days. (Patient not taking: Reported on 10/07/2021) 2 each 2   No current facility-administered medications for this visit.     PHYSICAL EXAMINATION: ECOG PERFORMANCE STATUS: 1 - Symptomatic but completely ambulatory Vitals:   10/07/21 0941  BP: 121/67  Pulse: 67  Resp: 18  Temp: (!) 96.1 F (35.6 C)   Filed Weights   10/07/21 0941  Weight: 193 lb 3.2 oz (87.6 kg)    Physical Exam Constitutional:      General: He is not in acute distress. HENT:     Head: Normocephalic and atraumatic.  Eyes:     General: No scleral icterus. Cardiovascular:     Rate and Rhythm: Normal rate and regular rhythm.     Heart sounds: Normal heart sounds.  Pulmonary:     Effort: Pulmonary effort is normal. No respiratory distress.     Breath sounds: No wheezing.  Abdominal:     General: Bowel sounds are normal. There is no distension.     Palpations: Abdomen is soft.  Musculoskeletal:     Cervical back: Normal range of motion and neck supple.     Comments: Range of motion of right knee is limited due to RA. Chronic joint swelling and deformity  Skin:    General: Skin is warm and dry.     Findings: No erythema or rash.  Neurological:     Mental Status: He is alert and oriented to person, place, and time. Mental status is at baseline.     Cranial Nerves: No cranial nerve deficit.     Coordination: Coordination normal.  Psychiatric:        Mood and Affect: Mood normal.    LABORATORY DATA:  I have reviewed the data as listed Lab Results  Component Value Date   WBC 6.3 10/07/2021   HGB 11.5 (L) 10/07/2021   HCT 35.9 (L) 10/07/2021   MCV 83.7 10/07/2021   PLT 218 10/07/2021   Recent Labs    02/03/21 1106 05/29/21 1339 09/16/21 1126 10/07/21 1020  NA 141 138 138 136  K 3.9 3.7 4.0 3.2*  CL 109 106 105 104  CO2 24 22 26 24    GLUCOSE 80 101* 80 98  BUN 15 16 17 20   CREATININE 1.04 1.00 1.01 1.10  CALCIUM 9.3 9.4 9.4 9.5  GFRNONAA 74  --   --  >60  GFRAA 86  --   --   --   PROT 7.1 7.6 7.7 8.7*  ALBUMIN  --   --   --  3.7  AST 21 19 21  32  ALT 16 12 15 23   ALKPHOS  --   --   --  91  BILITOT 0.6 0.6 0.5 0.4   Iron/TIBC/Ferritin/ %Sat    Component Value Date/Time   IRON 84 10/07/2021 1020   TIBC 256 10/07/2021 1020   FERRITIN 535 (H) 10/07/2021 1020   IRONPCTSAT 33 10/07/2021 1020      RADIOGRAPHIC STUDIES: I have personally reviewed the radiological images as listed and agreed with the findings in the report. No results found.    ASSESSMENT & PLAN:  1. Other decreased white blood cell (WBC) count   2. Anemia, unspecified type    #Chronic leukocytosis and anemia.  Likely secondary to chronic inflammation/rheumatoid arthritis, marrow suppression secondary to immunosuppressant.  Rule out other etiologies. Check check smear, LDH, CBC, CMP, peripheral blood flow cytometry, SPEP, light chain ratio, HIV, hepatitis panel, iron TIBC ferritin, folate, vitamin B12  Orders Placed This Encounter  Procedures   Vitamin B12    Standing Status:   Future    Number of Occurrences:   1    Standing Expiration Date:   10/07/2022   Folate    Standing Status:   Future    Number of Occurrences:   1    Standing Expiration Date:   10/07/2022   Ferritin    Standing Status:   Future    Number of Occurrences:   1    Standing Expiration Date:   04/06/2022   Iron and TIBC    Standing Status:   Future    Number of Occurrences:   1    Standing Expiration Date:   10/07/2022   Hepatitis panel, acute    Standing Status:   Future    Number of Occurrences:   1    Standing Expiration Date:   10/07/2022   HIV Antibody (routine testing w rflx)    Standing Status:   Future    Number of Occurrences:   1    Standing Expiration Date:   10/07/2022   Comprehensive metabolic panel    Standing Status:   Future    Number of  Occurrences:   1    Standing Expiration Date:   10/07/2022   CBC with Differential/Platelet    Standing Status:   Future    Number of Occurrences:   1    Standing Expiration Date:   10/07/2022   Multiple Myeloma Panel (SPEP&IFE w/QIG)    Standing Status:   Future    Number of Occurrences:   1    Standing Expiration Date:   10/07/2022   Kappa/lambda light chains    Standing Status:   Future    Number of Occurrences:   1    Standing Expiration Date:   10/07/2022   Flow cytometry panel-leukemia/lymphoma work-up    Standing Status:   Future    Number of Occurrences:   1    Standing Expiration Date:   10/07/2022   Lactate dehydrogenase    Standing Status:   Future    Number of Occurrences:   1    Standing Expiration Date:   10/07/2022   Technologist smear review    Standing Status:   Future    Number of Occurrences:   1    Standing Expiration Date:   10/07/2022    All questions were answered. The patient knows to call the clinic with any problems questions or concerns.  c Evern Bio H, NP    Return of visit: 3 to 4 weeks to review results. Thank you for this kind referral and the opportunity to participate in the  care of this patient. A copy of today's note is routed to referring provider   Earlie Server, MD, PhD [facility] Hematology Oncology 10/07/2021

## 2021-10-07 NOTE — Progress Notes (Signed)
Patient here to establish care for neutropenia.

## 2021-10-08 LAB — KAPPA/LAMBDA LIGHT CHAINS
Kappa free light chain: 147.6 mg/L — ABNORMAL HIGH (ref 3.3–19.4)
Kappa, lambda light chain ratio: 3 — ABNORMAL HIGH (ref 0.26–1.65)
Lambda free light chains: 49.2 mg/L — ABNORMAL HIGH (ref 5.7–26.3)

## 2021-10-09 LAB — COMP PANEL: LEUKEMIA/LYMPHOMA

## 2021-10-10 LAB — MULTIPLE MYELOMA PANEL, SERUM
Albumin SerPl Elph-Mcnc: 3.4 g/dL (ref 2.9–4.4)
Albumin/Glob SerPl: 0.8 (ref 0.7–1.7)
Alpha 1: 0.3 g/dL (ref 0.0–0.4)
Alpha2 Glob SerPl Elph-Mcnc: 0.8 g/dL (ref 0.4–1.0)
B-Globulin SerPl Elph-Mcnc: 2 g/dL — ABNORMAL HIGH (ref 0.7–1.3)
Gamma Glob SerPl Elph-Mcnc: 1.4 g/dL (ref 0.4–1.8)
Globulin, Total: 4.5 g/dL — ABNORMAL HIGH (ref 2.2–3.9)
IgA: 1479 mg/dL — ABNORMAL HIGH (ref 61–437)
IgG (Immunoglobin G), Serum: 1594 mg/dL (ref 603–1613)
IgM (Immunoglobulin M), Srm: 125 mg/dL (ref 20–172)
Total Protein ELP: 7.9 g/dL (ref 6.0–8.5)

## 2021-10-13 ENCOUNTER — Telehealth: Payer: Self-pay | Admitting: Rheumatology

## 2021-10-13 NOTE — Telephone Encounter (Signed)
Patient called the office stating he would like to know what to do with the leftover Humira pens he wont be taking. Patient requests a return call. ?

## 2021-10-14 NOTE — Progress Notes (Unsigned)
Office Visit Note  Patient: Michael Hinton.             Date of Birth: 1950-08-25           MRN: [id number]             PCP: Danelle Berry, NP Referring: Danelle Berry, NP Visit Date: 10/28/2021 Occupation: '@GUAROCC'$ @  Subjective:  No chief complaint on file.   History of Present Illness: Michael Forand. is a 71 y.o. male ***   Activities of Daily Living:  Patient reports morning stiffness for *** {minute/hour:19697}.   Patient {ACTIONS;DENIES/REPORTS:21021675::"Denies"} nocturnal pain.  Difficulty dressing/grooming: {ACTIONS;DENIES/REPORTS:21021675::"Denies"} Difficulty climbing stairs: {ACTIONS;DENIES/REPORTS:21021675::"Denies"} Difficulty getting out of chair: {ACTIONS;DENIES/REPORTS:21021675::"Denies"} Difficulty using hands for taps, buttons, cutlery, and/or writing: {ACTIONS;DENIES/REPORTS:21021675::"Denies"}  No Rheumatology ROS completed.   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   Rheumatoid arthritis involving multiple sites with positive rheumatoid factor ([place]) 06/02/2016   High risk medication use 06/02/2016   Hypertension 06/02/2016   Vitamin D deficiency 06/02/2016    Past Medical History:  Diagnosis Date   Anemia    High risk medication use 06/02/2016   Hypertension 06/02/2016   Neutropenia (H[facility])    Prediabetes    Rheumatoid arthritis involving multiple sites with positive rheumatoid factor ([place]) 06/02/2016    Family History  Problem Relation Age of Onset   Heart disease Mother    Diabetes Mother    Breast cancer Mother    Kidney disease Father    Diabetes Father    Diabetes Daughter    No past surgical history on file. Social History   Social History Narrative   Not on file   Immunization History   Administered Date(s) Administered   Moderna Sars-Covid-2 Vaccination 10/12/2019, 11/15/2019     Objective: Vital Signs: There were no vitals taken for this visit.   Physical Exam   Musculoskeletal Exam: ***  CDAI Exam: CDAI Score: -- Patient Global: --; Provider Global: -- Swollen: --; Tender: -- Joint Exam 10/28/2021   No joint exam has been documented for this visit   There is currently no information documented on the homunculus. Go to the Rheumatology activity and complete the homunculus joint exam.  Investigation: No additional findings.  Imaging: No results found.  Recent Labs: Lab Results  Component Value Date   WBC 6.3 10/07/2021   HGB 11.5 (L) 10/07/2021   PLT 218 10/07/2021   NA 136 10/07/2021   K 3.2 (L) 10/07/2021   CL 104 10/07/2021   CO2 24 10/07/2021   GLUCOSE 98 10/07/2021   BUN 20 10/07/2021   CREATININE 1.10 10/07/2021   BILITOT 0.4 10/07/2021   ALKPHOS 91 10/07/2021   AST 32 10/07/2021   ALT 23 10/07/2021   PROT 8.7 (H) 10/07/2021   ALBUMIN 3.7 10/07/2021   CALCIUM 9.5 10/07/2021   GFRAA 86 02/03/2021   QFTBGOLD NEGATIVE 06/16/2017   QFTBGOLDPLUS NEGATIVE 02/03/2021    Speciality Comments: No specialty comments available.  Procedures:  No procedures performed Allergies: Patient has no known allergies.   Assessment / Plan:     Visit Diagnoses: No diagnosis found.  Orders: No orders of the defined types were placed in this encounter.  No orders of the defined types were placed in this encounter.   Face-to-face time  spent with patient was *** minutes. Greater than 50% of time was spent in counseling and coordination of care.  Follow-Up Instructions: No follow-ups on file.   Earnestine Mealing, CMA  Note - This record has been created using Editor, commissioning.  Chart creation errors have been sought, but may not always  have been located. Such creation errors do not reflect on  the standard of medical care.

## 2021-10-15 ENCOUNTER — Other Ambulatory Visit (HOSPITAL_COMMUNITY): Payer: Self-pay

## 2021-10-15 NOTE — Telephone Encounter (Signed)
I called patient regarding Humira. He states that he will not be taking Humira because it caused hand numbness for several days after the injection. Since he has not taken another dose, he has been feeling "a whole lot better." He also states that he thinks acetaminophen is helping with any intermittent pain.  I advised that he will likely need to remain on a maintenance therapy for his RA ? ?I advised that we can review at his f/u visit but our recommendation will be for him to remain on maintenance medication vs acetaminophen PRN ? ?Knox Saliva, PharmD, MPH, BCPS ?Clinical Pharmacist (Rheumatology and Pulmonology) ?

## 2021-10-28 ENCOUNTER — Telehealth: Payer: Self-pay | Admitting: Pharmacist

## 2021-10-28 ENCOUNTER — Other Ambulatory Visit: Payer: Self-pay

## 2021-10-28 ENCOUNTER — Ambulatory Visit: Payer: Medicare HMO | Admitting: Physician Assistant

## 2021-10-28 ENCOUNTER — Encounter: Payer: Self-pay | Admitting: Physician Assistant

## 2021-10-28 VITALS — BP 119/73 | HR 72 | Ht 71.0 in | Wt 192.6 lb

## 2021-10-28 DIAGNOSIS — M5136 Other intervertebral disc degeneration, lumbar region: Secondary | ICD-10-CM | POA: Diagnosis not present

## 2021-10-28 DIAGNOSIS — M24522 Contracture, left elbow: Secondary | ICD-10-CM | POA: Diagnosis not present

## 2021-10-28 DIAGNOSIS — I10 Essential (primary) hypertension: Secondary | ICD-10-CM | POA: Diagnosis not present

## 2021-10-28 DIAGNOSIS — E559 Vitamin D deficiency, unspecified: Secondary | ICD-10-CM | POA: Diagnosis not present

## 2021-10-28 DIAGNOSIS — M24561 Contracture, right knee: Secondary | ICD-10-CM | POA: Diagnosis not present

## 2021-10-28 DIAGNOSIS — Z79899 Other long term (current) drug therapy: Secondary | ICD-10-CM | POA: Diagnosis not present

## 2021-10-28 DIAGNOSIS — D702 Other drug-induced agranulocytosis: Secondary | ICD-10-CM

## 2021-10-28 DIAGNOSIS — M0579 Rheumatoid arthritis with rheumatoid factor of multiple sites without organ or systems involvement: Secondary | ICD-10-CM

## 2021-10-28 DIAGNOSIS — R768 Other specified abnormal immunological findings in serum: Secondary | ICD-10-CM

## 2021-10-28 DIAGNOSIS — M24562 Contracture, left knee: Secondary | ICD-10-CM

## 2021-10-28 DIAGNOSIS — M24521 Contracture, right elbow: Secondary | ICD-10-CM | POA: Diagnosis not present

## 2021-10-28 DIAGNOSIS — Z111 Encounter for screening for respiratory tuberculosis: Secondary | ICD-10-CM

## 2021-10-28 MED ORDER — PREDNISONE 5 MG PO TABS: ORAL_TABLET | ORAL | 0 refills | Status: DC

## 2021-10-28 NOTE — Telephone Encounter (Signed)
Ok to try applying for Darcus Pester if the patient is in agreement.  Please call the patient to discuss the indications, contraindications, and potential side effects of kevzara.

## 2021-10-28 NOTE — Progress Notes (Signed)
Pharmacy Note ? ?Subjective: ?Patient presents today to [facility] for follow up office visit.   Patient seen by the pharmacist for counseling on subcutaneous Orencia for rheumatoid arthritis.  Prior therapy includes: Enbrel, Humira. ? ?Objective: ?CBC ?   ?Component Value Date/Time  ? WBC 6.3 10/07/2021 1020  ? RBC 4.29 10/07/2021 1020  ? HGB 11.5 (L) 10/07/2021 1020  ? HCT 35.9 (L) 10/07/2021 1020  ? PLT 218 10/07/2021 1020  ? MCV 83.7 10/07/2021 1020  ? MCH 26.8 10/07/2021 1020  ? MCHC 32.0 10/07/2021 1020  ? RDW 14.5 10/07/2021 1020  ? LYMPHSABS 0.8 10/07/2021 1020  ? MONOABS 0.4 10/07/2021 1020  ? EOSABS 0.0 10/07/2021 1020  ? BASOSABS 0.0 10/07/2021 1020  ? ? ?CMP  ?   ?Component Value Date/Time  ? NA 136 10/07/2021 1020  ? NA 136 (A) 04/23/2016 0000  ? K 3.2 (L) 10/07/2021 1020  ? CL 104 10/07/2021 1020  ? CO2 24 10/07/2021 1020  ? GLUCOSE 98 10/07/2021 1020  ? BUN 20 10/07/2021 1020  ? BUN 24 (A) 04/23/2016 0000  ? CREATININE 1.10 10/07/2021 1020  ? CREATININE 1.01 09/16/2021 1126  ? CALCIUM 9.5 10/07/2021 1020  ? PROT 8.7 (H) 10/07/2021 1020  ? ALBUMIN 3.7 10/07/2021 1020  ? AST 32 10/07/2021 1020  ? ALT 23 10/07/2021 1020  ? ALKPHOS 91 10/07/2021 1020  ? BILITOT 0.4 10/07/2021 1020  ? GFRNONAA >60 10/07/2021 1020  ? GFRNONAA 74 02/03/2021 1106  ? GFRAA 86 02/03/2021 1106  ? ? ?Baseline Immunosuppressant Therapy Labs ?TB GOLD ?Quantiferon TB Gold Latest Ref Rng & Units 02/03/2021  ?Quantiferon TB Gold Plus NEGATIVE NEGATIVE  ? ?Hepatitis Panel ?Hepatitis Latest Ref Rng & Units 10/07/2021  ?Hep B Surface Ag NON REACTIVE NON REACTIVE  ?Hep B IgM NON REACTIVE NON REACTIVE  ?Hep C Ab NON REACTIVE NON REACTIVE  ?Hep A IgM NON REACTIVE NON REACTIVE  ? ?HIV ?Lab Results  ?Component Value Date  ? HIV Non Reactive 10/07/2021  ? ?Immunoglobulins ?Immunoglobulin Electrophoresis Latest Ref Rng & Units 10/07/2021  ?IgG 603 - 1,613 mg/dL 1,594  ?IgM 20 - 172 mg/dL 125  ? ?SPEP ?Serum Protein Electrophoresis  Latest Ref Rng & Units 10/07/2021  ?Total Protein 6.5 - 8.1 g/dL 8.7(H)  ? ?G6PD ?No results found for: G6PDH ?TPMT ?No results found for: TPMT  ? ?Chest x-ray:05/22/14 - no acute cardiopulmonary disease ? ?Does patient have a diagnosis of COPD? No ? ?Does patient have history of diverticulitis?  No ? ?Assessment/Plan:  ?Counseled patient that Maureen Chatters is a selective T-cell costimulation blocker.  Counseled patient on purpose, proper use, and adverse effects of Orencia. The most common adverse effects are increased risk of infections, headache, and injection site reactions.  There is the possibility of an increased risk of malignancy but it is not well understood if this increased risk is due to the medication or the disease state. Reviewed risk of GI perforation which is higher in patients with diverticulitis and diabetes.  Counseled patient that Maureen Chatters should be held prior to scheduled surgery.  Counseled patient to avoid live vaccines while on Orencia.  Recommend annual influenza, Pneumovax 23, Prevnar 13, and Shingrix as indicated.  ? ?Reviewed the importance of regular labs while on Orencia therapy. Patient will be due for labs 1 month after starting therapy. Standing orders placed. Provided patient with medication education material and answered all questions.  Patient consented to [facility].  Will upload consent into patient's chart.  Will  apply for Orencia through patient's insurance.  Reviewed storage information for Orencia.  Advised initial injection must be administered in office.   ? ?Patient dose will be Orencia 125 mg SQ every 7 days.  Prescription pending lab results and/or insurance approval.  ? ?Knox Saliva, PharmD, MPH, BCPS ?Clinical Pharmacist (Rheumatology and Pulmonology) ?

## 2021-10-28 NOTE — Telephone Encounter (Signed)
Received a fax regarding Prior Authorization from [place] for [facility]. Authorization has been DENIED because patient must try or be unable to try both [place]. Per chart review, patient does not have contraindication to using the alternatives. ? ?Kevzara - no history of diverticulitis or hyperlipidemia ?Morrie Sheldon - no history of MACE ? ?Cristan Hout (Clinical Pharmacist) & Arvilla Market (Patient Advocate) ?

## 2021-10-28 NOTE — Telephone Encounter (Signed)
Submitted a Prior Authorization request to [facility] via CoverMyMeds. Will update once we receive a response. ? ?Key: [id number] ? ?Per questions, Humira, Enbrel, [place] are formulary-preferred. Patient has tried Enbrel and Humira. Donnetta Hail and Orson Ape are not indicated for rheumatoid arthritis. No contraindication to [place] use. ? ?Patient has grant through [employer] until 08/27/22 so will be able to fill with [facility] once approved ? ?Knox Saliva, PharmD, MPH, BCPS ?Clinical Pharmacist (Rheumatology and Pulmonology) ?

## 2021-10-29 ENCOUNTER — Telehealth: Payer: Self-pay | Admitting: Pharmacist

## 2021-10-29 ENCOUNTER — Other Ambulatory Visit (HOSPITAL_COMMUNITY): Payer: Self-pay

## 2021-10-29 NOTE — Telephone Encounter (Signed)
Pharmacy Note ?Subjective: ?Patient called today by [facility] pharmacy team for [facility] counseling. Patient had OV yesterday, 10/28/21 and decision was made to start [place] however insurance denied (prefer [place]) ? ?Patient seen by the pharmacist for counseling on Kevzara for rheumatoid arthritis.   Prior therapy includes: Enbrel (waning response), Humira (arm numbness) ? ?History of hyperlipidemia: No ? ?History of diverticulitis: No ? ?Objective: ? ?CBC ?   ?Component Value Date/Time  ? WBC 6.3 10/07/2021 1020  ? RBC 4.29 10/07/2021 1020  ? HGB 11.5 (L) 10/07/2021 1020  ? HCT 35.9 (L) 10/07/2021 1020  ? PLT 218 10/07/2021 1020  ? MCV 83.7 10/07/2021 1020  ? MCH 26.8 10/07/2021 1020  ? MCHC 32.0 10/07/2021 1020  ? RDW 14.5 10/07/2021 1020  ? LYMPHSABS 0.8 10/07/2021 1020  ? MONOABS 0.4 10/07/2021 1020  ? EOSABS 0.0 10/07/2021 1020  ? BASOSABS 0.0 10/07/2021 1020  ? ? ?CMP  ?   ?Component Value Date/Time  ? NA 136 10/07/2021 1020  ? NA 136 (A) 04/23/2016 0000  ? K 3.2 (L) 10/07/2021 1020  ? CL 104 10/07/2021 1020  ? CO2 24 10/07/2021 1020  ? GLUCOSE 98 10/07/2021 1020  ? BUN 20 10/07/2021 1020  ? BUN 24 (A) 04/23/2016 0000  ? CREATININE 1.10 10/07/2021 1020  ? CREATININE 1.01 09/16/2021 1126  ? CALCIUM 9.5 10/07/2021 1020  ? PROT 8.7 (H) 10/07/2021 1020  ? ALBUMIN 3.7 10/07/2021 1020  ? AST 32 10/07/2021 1020  ? ALT 23 10/07/2021 1020  ? ALKPHOS 91 10/07/2021 1020  ? BILITOT 0.4 10/07/2021 1020  ? GFRNONAA >60 10/07/2021 1020  ? GFRNONAA 74 02/03/2021 1106  ? GFRAA 86 02/03/2021 1106  ?  ? ?Baseline Immunosuppressant Therapy Labs ? ?TB GOLD ? ?  Latest Ref Rng & Units 02/03/2021  ? 11:06 AM  ?Quantiferon TB Gold  ?Quantiferon TB Gold Plus NEGATIVE NEGATIVE    ? ? ?Hepatitis Panel ? ?  Latest Ref Rng & Units 10/07/2021  ? 10:20 AM  ?Hepatitis  ?Hep B Surface Ag NON REACTIVE NON REACTIVE    ?Hep B IgM NON REACTIVE NON REACTIVE    ?Hep C Ab NON REACTIVE NON REACTIVE    ?Hep A IgM NON REACTIVE  NON REACTIVE    ? ? ?HIV ?Lab Results  ?Component Value Date  ? HIV Non Reactive 10/07/2021  ? ? ?Immunoglobulins ? ?  Latest Ref Rng & Units 10/07/2021  ? 10:20 AM  ?Immunoglobulin Electrophoresis  ?IgG 603 - 1,613 mg/dL 1,594    ?IgM 20 - 172 mg/dL 125    ? ? ?SPEP ? ?  Latest Ref Rng & Units 10/07/2021  ? 10:20 AM  ?Serum Protein Electrophoresis  ?Total Protein 6.5 - 8.1 g/dL 8.7    ? ?Lipid Panel - drawn 08/17/21 ?Total cholesterol 85 ?Triglycerides 57 ?HDL 38 ?VLDL 14 ?LDL 33 ? ?Chest x-ray: 06/01/14 - No acute cardiopulmonary process ? ?Assessment/Plan:  ?Counseled patient that Darcus Pester is an IL-6 blocking agent.  Counseled patient on purpose, proper use, and adverse effects of Kevzara.  Reviewed the most common adverse effects including infections, infusion reactions, bowel injury, and rarely cancer and conditions of the nervous system.  Reviewed risk of lipid abnormalities and elevated liver enzymes. Discussed that there is the possibility of an increased risk of malignancy but it is not well understood if this increased risk is due to the medication or the disease state. Counseled patient that Darcus Pester should be held prior to  scheduled surgery for infections or new antibiotic use. Counseled patient that Darcus Pester should be held prior to scheduled surgery. Recommend annual influenza, PCV 15 or PCV20 or Pneumovax 23, and Shingrix as indicated.  Reviewed the importance of regular labs while on Kevzara therapy including the need for routine lipid panel.  Will recheck lipid panel after 4-8 weeks of starting Kevzara, then every 6 months routinely. CBC and CMP will be monitored every 3 months. TB gold will be monitored annually. Standing orders placed. ? ?Provided patient with medication education material and answered all questions.  Patient voiced understanding.  Patient verbally consented to [facility].  Will obtain consent at new start visit.  Reviewed storage instructions of Darcus Pester with patient.  Advised patient that  initial Kevzara injection must be given in the office.  Will apply for Kevzara through patient's insurance for self-administration using auto-injector/prefilled syringe. ? ?Patient dose will be Kevzara 200 mg every 14 days.  Prescription pending lab results and/or insurance approval. ? ?Knox Saliva, PharmD, MPH, BCPS ?Clinical Pharmacist (Rheumatology and Pulmonology) ?

## 2021-10-29 NOTE — Telephone Encounter (Signed)
Received notification from [facility] regarding a prior authorization for [facility]. Authorization has been APPROVED from 09/10/21 to 08/09/22.  ? ?Patient has no copay. Patient can fill through [place]: [phone] .  ? ?PA Case ID: [id number] ?Phone # [phone] ? ?ATC patient to schedule Kevzara new start visit. Unable to reach. Left VM requesting return call ? ?Knox Saliva, PharmD, MPH, BCPS ?Clinical Pharmacist (Rheumatology and Pulmonology) ? ?

## 2021-10-29 NOTE — Telephone Encounter (Signed)
Submitted a Prior Authorization request to [facility] via CoverMyMeds. Will update once we receive a response. ? ?Dose: '200mg'$  SQ every 14 days ? ?Key: [id number] ? ?Consent will need to be obtained at new start visit ? ?Knox Saliva, PharmD, MPH, BCPS ?Clinical Pharmacist (Rheumatology and Pulmonology) ?

## 2021-10-29 NOTE — Telephone Encounter (Signed)
Patient scheduled for Kevzara new start on 10/30/21. Will use sample. Subsequent refills can be sent to [facility] ? ?Knox Saliva, PharmD, MPH, BCPS ?Clinical Pharmacist (Rheumatology and Pulmonology) ?

## 2021-10-30 ENCOUNTER — Ambulatory Visit (INDEPENDENT_AMBULATORY_CARE_PROVIDER_SITE_OTHER): Payer: Medicare HMO | Admitting: Pharmacist

## 2021-10-30 ENCOUNTER — Other Ambulatory Visit: Payer: Self-pay

## 2021-10-30 ENCOUNTER — Other Ambulatory Visit (HOSPITAL_COMMUNITY): Payer: Self-pay

## 2021-10-30 VITALS — BP 117/68 | HR 71

## 2021-10-30 DIAGNOSIS — M0579 Rheumatoid arthritis with rheumatoid factor of multiple sites without organ or systems involvement: Secondary | ICD-10-CM

## 2021-10-30 DIAGNOSIS — Z7189 Other specified counseling: Secondary | ICD-10-CM

## 2021-10-30 DIAGNOSIS — Z79899 Other long term (current) drug therapy: Secondary | ICD-10-CM

## 2021-10-30 MED ORDER — KEVZARA 200 MG/1.14ML ~~LOC~~ SOAJ
200.0000 mg | SUBCUTANEOUS | 2 refills | Status: DC
  Filled 2021-10-30: qty 2.28, fill #0
  Filled 2021-11-11: qty 2.28, 28d supply, fill #0
  Filled 2021-12-04: qty 2.28, 28d supply, fill #1

## 2021-10-30 NOTE — Progress Notes (Signed)
Pharmacy Note ? ?Subjective:   ?Patient presents to clinic today to receive first dose of Kevzara. Patient previously on Enbrel and had waning clinical response. She started Humira on 09/30/21 and stated that it caused hand numbness for several days after the injection. He did not take any doses after that first dose in the clinic. The plan was to try for Maureen Chatters however his insurance requires him to try Darcus Pester or Morrie Sheldon before Maureen Chatters can be approved. ? ?Patient running a fever or have signs/symptoms of infection? No ? ?Patient currently on antibiotics for the treatment of infection? No ? ?Patient have any upcoming invasive procedures/surgeries? No ? ?Objective: ?CMP  ?   ?Component Value Date/Time  ? NA 136 10/07/2021 1020  ? NA 136 (A) 04/23/2016 0000  ? K 3.2 (L) 10/07/2021 1020  ? CL 104 10/07/2021 1020  ? CO2 24 10/07/2021 1020  ? GLUCOSE 98 10/07/2021 1020  ? BUN 20 10/07/2021 1020  ? BUN 24 (A) 04/23/2016 0000  ? CREATININE 1.10 10/07/2021 1020  ? CREATININE 1.01 09/16/2021 1126  ? CALCIUM 9.5 10/07/2021 1020  ? PROT 8.7 (H) 10/07/2021 1020  ? ALBUMIN 3.7 10/07/2021 1020  ? AST 32 10/07/2021 1020  ? ALT 23 10/07/2021 1020  ? ALKPHOS 91 10/07/2021 1020  ? BILITOT 0.4 10/07/2021 1020  ? GFRNONAA >60 10/07/2021 1020  ? GFRNONAA 74 02/03/2021 1106  ? GFRAA 86 02/03/2021 1106  ? ? ?CBC ?   ?Component Value Date/Time  ? WBC 6.3 10/07/2021 1020  ? RBC 4.29 10/07/2021 1020  ? HGB 11.5 (L) 10/07/2021 1020  ? HCT 35.9 (L) 10/07/2021 1020  ? PLT 218 10/07/2021 1020  ? MCV 83.7 10/07/2021 1020  ? MCH 26.8 10/07/2021 1020  ? MCHC 32.0 10/07/2021 1020  ? RDW 14.5 10/07/2021 1020  ? LYMPHSABS 0.8 10/07/2021 1020  ? MONOABS 0.4 10/07/2021 1020  ? EOSABS 0.0 10/07/2021 1020  ? BASOSABS 0.0 10/07/2021 1020  ? ? ?Baseline Immunosuppressant Therapy Labs ?TB GOLD ? ?  Latest Ref Rng & Units 02/03/2021  ? 11:06 AM  ?Quantiferon TB Gold  ?Quantiferon TB Gold Plus NEGATIVE NEGATIVE    ? ?Hepatitis Panel ? ?  Latest Ref Rng & Units  10/07/2021  ? 10:20 AM  ?Hepatitis  ?Hep B Surface Ag NON REACTIVE NON REACTIVE    ?Hep B IgM NON REACTIVE NON REACTIVE    ?Hep C Ab NON REACTIVE NON REACTIVE    ?Hep A IgM NON REACTIVE NON REACTIVE    ? ?HIV ?Lab Results  ?Component Value Date  ? HIV Non Reactive 10/07/2021  ? ?Immunoglobulins ? ?  Latest Ref Rng & Units 10/07/2021  ? 10:20 AM  ?Immunoglobulin Electrophoresis  ?IgG 603 - 1,613 mg/dL 1,594    ?IgM 20 - 172 mg/dL 125    ? ?SPEP ? ?  Latest Ref Rng & Units 10/07/2021  ? 10:20 AM  ?Serum Protein Electrophoresis  ?Total Protein 6.5 - 8.1 g/dL 8.7    ? ?G6PD ?No results found for: G6PDH ?TPMT ?No results found for: TPMT  ? ?Chest x-ray: 05/22/2014 ? ?Assessment/Plan:  ?Counseled patient that Darcus Pester is an IL-6 blocking agent.  Counseled patient on purpose, proper use, and adverse effects of Kevzara.  Reviewed the most common adverse effects including infections, infusion reactions, bowel injury, and rarely cancer and conditions of the nervous system.  Reviewed risk of lipid abnormalities and elevated liver enzymes. Discussed that there is the possibility of an increased risk of malignancy but it  is not well understood if this increased risk is due to the medication or the disease state. Counseled patient that Darcus Pester should be held prior to scheduled surgery for infections or new antibiotic use. Counseled patient that Darcus Pester should be held prior to scheduled surgery. Reviewed the importance of regular labs while on Kevzara therapy including the need for routine lipid panel.  Will recheck lipid panel after 4-8 weeks of starting Kevzara, then every 6 months routinely. CBC and CMP will be monitored every 3 months. TB gold will be monitored annually. Standing orders placed. ? ?Provided patient with medication education material and answered all questions.  Patient voiced understanding.  Patient consented to [facility].  Will upload consent into the media tab.  Reviewed storage instructions of Darcus Pester with  patient.  Advised patient that initial Kevzara injection must be given in the office.   ? ?Demonstrated proper injection technique with Kevzara demo device  Patient able to demonstrate proper injection technique using the teach back method.  Patient self injected in the right thigh with: ? ?Sample Medication: Kevzara '200mg'$ /1.14 mL autoinjector pen ?[place]: [phone] ?Lot: [id number] ?Expiration: 01/07/2022 ? ?Patient tolerated well.  Observed for 30 mins in office for adverse reaction and none noted.  ? ?Patient is to return in 1 month for labs and 6-8 weeks for follow-up appointment.  Standing orders for CBC and CMP remain in place. Future order for lipid panel placed. ? ?Darcus Pester approved through insurance.   Rx sent to: [facility]: [phone] .  Patient provided with pharmacy phone number and advised to call later this week to schedule shipment to home. ? ?He will continue Kevzara '200mg'$  SQ every 14 days. ? ?All questions encouraged and answered.  Instructed patient to call with any further questions or concerns. ? ?Knox Saliva, PharmD, MPH, BCPS ?Clinical Pharmacist (Rheumatology and Pulmonology) ? ?10/30/2021 8:14 AM ?

## 2021-10-30 NOTE — Patient Instructions (Signed)
Your next [facility] dose is due on 11/13/21, 11/27/21, and every 14 days thereafter ? ?HOLD KEVZARA if you have signs or symptoms of an infection. You can resume once you feel better or back to your baseline. ?HOLD KEVZARA if you start antibiotics to treat an infection. ?HOLD KEVZARA around the time of surgery/procedures. Your surgeon will be able to provide recommendations on when to hold BEFORE and when you are cleared to [place]. ? ?Pharmacy information: ?Your prescription will be shipped from [facility]. ?Their phone number is [phone] ?They will call you to schedule shipment and confirm address. They will mail your medication to your home. ? ?Labs are due in 1 month then every 3 months. ?Lab hours are from Monday to Thursday 1:30-4:30pm and Friday 1:30-4pm. You do not need an appointment if you come for labs during these times. ? ?How to manage an injection site reaction: ?Remember the 5 C's: ?COUNTER - leave on the counter at least 30 minutes but up to overnight to bring medication to room temperature. This may help prevent stinging ?COLD - place something cold (like an ice gel pack or cold water bottle) on the injection site just before cleansing with alcohol. This may help reduce pain ?CLARITIN - use Claritin (generic name is loratadine) for the first two weeks of treatment or the day of, the day before, and the day after injecting. This will help to minimize injection site reactions ?CORTISONE CREAM - apply if injection site is irritated and itching ?CALL ME - if injection site reaction is bigger than the size of your fist, looks infected, blisters, or if you develop hives ?

## 2021-11-04 ENCOUNTER — Other Ambulatory Visit: Payer: Self-pay

## 2021-11-04 ENCOUNTER — Encounter: Payer: Self-pay | Admitting: Oncology

## 2021-11-04 ENCOUNTER — Inpatient Hospital Stay: Payer: Medicare HMO | Attending: Oncology | Admitting: Oncology

## 2021-11-04 ENCOUNTER — Inpatient Hospital Stay: Payer: Medicare HMO

## 2021-11-04 VITALS — BP 148/69 | HR 61 | Temp 97.4°F | Wt 192.0 lb

## 2021-11-04 DIAGNOSIS — R768 Other specified abnormal immunological findings in serum: Secondary | ICD-10-CM

## 2021-11-04 DIAGNOSIS — D72818 Other decreased white blood cell count: Secondary | ICD-10-CM | POA: Diagnosis not present

## 2021-11-04 DIAGNOSIS — M069 Rheumatoid arthritis, unspecified: Secondary | ICD-10-CM | POA: Insufficient documentation

## 2021-11-04 DIAGNOSIS — D649 Anemia, unspecified: Secondary | ICD-10-CM | POA: Insufficient documentation

## 2021-11-04 DIAGNOSIS — Z87891 Personal history of nicotine dependence: Secondary | ICD-10-CM | POA: Insufficient documentation

## 2021-11-04 DIAGNOSIS — R5383 Other fatigue: Secondary | ICD-10-CM | POA: Insufficient documentation

## 2021-11-04 DIAGNOSIS — R7989 Other specified abnormal findings of blood chemistry: Secondary | ICD-10-CM | POA: Insufficient documentation

## 2021-11-04 DIAGNOSIS — Z803 Family history of malignant neoplasm of breast: Secondary | ICD-10-CM | POA: Insufficient documentation

## 2021-11-04 DIAGNOSIS — D72819 Decreased white blood cell count, unspecified: Secondary | ICD-10-CM | POA: Diagnosis not present

## 2021-11-04 DIAGNOSIS — E538 Deficiency of other specified B group vitamins: Secondary | ICD-10-CM | POA: Insufficient documentation

## 2021-11-04 NOTE — Progress Notes (Signed)
?Hematology/Oncology Progress note ?Telephone:([phone] Fax:(336) [phone] ?  ? ?  ? ?   ? ? ?Patient Care Team: ?Danelle Berry, NP as PCP - General (Nurse Practitioner) ?Earlie Server, MD as Consulting Physician (Hematology and Oncology) ? ?REFERRING PROVIDER: ?Danelle Berry, NP  ?CHIEF COMPLAINTS/REASON FOR VISIT:  ?Evaluation of anemia and leukopenia ? ?HISTORY OF PRESENTING ILLNESS:  ? ?Michael Ganus. is a  71 y.o.  male with PMH listed below was seen in consultation at the request of  Danelle Berry, NP  for evaluation of anemia and leukopenia. ? ?Reviewed patient's previous lab work. ?He has chronic anemia since at least 2017.  His baseline hemoglobin is around 11. ?Most recently he had a blood work done on 09/16/2021 which showed a hemoglobin of 10.8.  MCV 82 ? ?Chronic leukopenia, baseline of total white count is around 3. ?Patient has history of rheumatoid arthritis.  Previously on Enbrel.  Recently he has been switched to Humira and status post 1 dose.  He is currently taking prednisone for side effects from Humira. ? ?Patient denies any unintentional weight loss, night sweats, fever.  Chronic arthralgia due to RA. ? ?INTERVAL HISTORY ?Michael Hinton. is a 71 y.o. male who has above history reviewed by me today presents for follow up visit for leukopenia, anemia.  Patient present to review blood work. ?Patient recalls that he has been started on prednisone few days prior to his blood work. ? ?Review of Systems  ?Constitutional:  Positive for fatigue. Negative for appetite change, chills, fever and unexpected weight change.  ?HENT:   Negative for hearing loss and voice change.   ?Eyes:  Negative for eye problems and icterus.  ?Respiratory:  Negative for chest tightness, cough and shortness of breath.   ?Cardiovascular:  Negative for chest pain and leg swelling.  ?Gastrointestinal:  Negative for abdominal distention and abdominal pain.  ?Endocrine: Negative for hot flashes.  ?Genitourinary:   Negative for difficulty urinating, dysuria and frequency.   ?Musculoskeletal:  Positive for arthralgias.  ?     Chronic joint deformity  ?Skin:  Negative for itching and rash.  ?Neurological:  Negative for light-headedness and numbness.  ?Hematological:  Negative for adenopathy. Does not bruise/bleed easily.  ?Psychiatric/Behavioral:  Negative for confusion.   ? ?MEDICAL HISTORY:  ?Past Medical History:  ?Diagnosis Date  ? Anemia   ? High risk medication use 06/02/2016  ? Hypertension 06/02/2016  ? Neutropenia ([place])   ? Prediabetes   ? Rheumatoid arthritis involving multiple sites with positive rheumatoid factor ([place]) 06/02/2016  ? ? ?SURGICAL HISTORY: ?History reviewed. No pertinent surgical history. ? ?SOCIAL HISTORY: ?Social History  ? ?Socioeconomic History  ? Marital status: Married  ?  Spouse name: Not on file  ? Number of children: Not on file  ? Years of education: Not on file  ? Highest education level: Not on file  ?Occupational History  ? Not on file  ?Tobacco Use  ? Smoking status: Former  ?  Packs/day: 0.50  ?  Years: 7.00  ?  Pack years: 3.50  ?  Types: Cigarettes  ?  Quit date: 03/15/1978  ?  Years since quitting: 43.6  ?  Passive exposure: Past  ? Smokeless tobacco: Never  ?Vaping Use  ? Vaping Use: Never used  ?Substance and Sexual Activity  ? Alcohol use: Not Currently  ? Drug use: No  ? Sexual activity: Not on file  ?Other Topics Concern  ? Not on file  ?  Social History Narrative  ? Not on file  ? ?Social Determinants of Health  ? ?Financial Resource Strain: Not on file  ?Food Insecurity: Not on file  ?Transportation Needs: Not on file  ?Physical Activity: Not on file  ?Stress: Not on file  ?Social Connections: Not on file  ?Intimate Partner Violence: Not on file  ? ? ?FAMILY HISTORY: ?Family History  ?Problem Relation Age of Onset  ? Heart disease Mother   ? Diabetes Mother   ? Breast cancer Mother   ? Kidney disease Father   ? Diabetes Father   ? Diabetes Daughter   ? ? ?ALLERGIES:  has No Known  Allergies. ? ?MEDICATIONS:  ?Current Outpatient Medications  ?Medication Sig Dispense Refill  ? amLODipine (NORVASC) 10 MG tablet Take 10 mg by mouth every morning.    ? atorvastatin (LIPITOR) 20 MG tablet     ? Cholecalciferol (VITAMIN D-3) 125 MCG (5000 UT) TABS Take 1 tablet by mouth daily.    ? Ferrous Sulfate (IRON PO) Take by mouth daily.    ? losartan (COZAAR) 100 MG tablet     ? predniSONE (DELTASONE) 5 MG tablet Take 4 tabs po qd x 4 days, 3  tabs po qd x 4 days, 2  tabs po qd x 4 days, 1  tab po qd x 4 days 40 tablet 0  ? Sarilumab (KEVZARA) 200 MG/1.14ML SOAJ Inject 200 mg into the skin every 14 (fourteen) days. 2.28 mL 2  ? ?No current facility-administered medications for this visit.  ? ? ? ?PHYSICAL EXAMINATION: ?ECOG PERFORMANCE STATUS: 1 - Symptomatic but completely ambulatory ?Vitals:  ? 11/04/21 0929  ?BP: (!) 148/69  ?Pulse: 61  ?Temp: (!) 97.4 ?F (36.3 ?C)  ? ?Filed Weights  ? 11/04/21 0929  ?Weight: 192 lb (87.1 kg)  ? ? ?Physical Exam ?Constitutional:   ?   General: He is not in acute distress. ?HENT:  ?   Head: Normocephalic and atraumatic.  ?Eyes:  ?   General: No scleral icterus. ?Cardiovascular:  ?   Rate and Rhythm: Normal rate and regular rhythm.  ?   Heart sounds: Normal heart sounds.  ?Pulmonary:  ?   Effort: Pulmonary effort is normal. No respiratory distress.  ?   Breath sounds: No wheezing.  ?Abdominal:  ?   General: Bowel sounds are normal. There is no distension.  ?   Palpations: Abdomen is soft.  ?Musculoskeletal:  ?   Cervical back: Normal range of motion and neck supple.  ?   Comments: Range of motion of right knee is limited due to RA. ?Chronic joint swelling and deformity  ?Skin: ?   General: Skin is warm and dry.  ?   Findings: No erythema or rash.  ?Neurological:  ?   Mental Status: He is alert and oriented to person, place, and time. Mental status is at baseline.  ?   Cranial Nerves: No cranial nerve deficit.  ?   Coordination: Coordination normal.  ?Psychiatric:     ?    Mood and Affect: Mood normal.  ? ? ?LABORATORY DATA:  ?I have reviewed the data as listed ?Lab Results  ?Component Value Date  ? WBC 6.3 10/07/2021  ? HGB 11.5 (L) 10/07/2021  ? HCT 35.9 (L) 10/07/2021  ? MCV 83.7 10/07/2021  ? PLT 218 10/07/2021  ? ?Recent Labs  ?  02/03/21 ?1106 05/29/21 ?1339 09/16/21 ?1126 10/07/21 ?1020  ?NA 141 138 138 136  ?K 3.9 3.7 4.0 3.2*  ?CL  109 106 105 104  ?CO2 '24 22 26 24  '$ ?GLUCOSE 80 101* 80 98  ?BUN '15 16 17 20  '$ ?CREATININE 1.04 1.00 1.01 1.10  ?CALCIUM 9.3 9.4 9.4 9.5  ?GFRNONAA 74  --   --  >60  ?GFRAA 86  --   --   --   ?PROT 7.1 7.6 7.7 8.7*  ?ALBUMIN  --   --   --  3.7  ?AST '21 19 21 '$ 32  ?ALT '16 12 15 23  '$ ?ALKPHOS  --   --   --  91  ?BILITOT 0.6 0.6 0.5 0.4  ? ? ?Iron/TIBC/Ferritin/ %Sat ?   ?Component Value Date/Time  ? IRON 84 10/07/2021 1020  ? TIBC 256 10/07/2021 1020  ? FERRITIN 535 (H) 10/07/2021 1020  ? IRONPCTSAT 33 10/07/2021 1020  ? ?  ? ? ?RADIOGRAPHIC STUDIES: ?I have personally reviewed the radiological images as listed and agreed with the findings in the report. ?No results found. ? ? ? ?ASSESSMENT & PLAN:  ?1. Other decreased white blood cell (WBC) count   ?2. Anemia, unspecified type   ?3. Elevated ferritin   ?4. Vitamin B12 deficiency   ?5. Elevated serum immunoglobulin free light chain level   ? ?##Chronic leukopenia, likely due to chronic inflammation and rheumatoid arthritis treatments. ?Labs reviewed and discussed with patient. ?Leukopenia has improved, total white count is normal.  This is possibly secondary to recent steroid use.Peripheral blood flow cytometry showed no immunophenotypic abnormality.  Negative hepatitis panel, negative HIV. ?No M protein on protein electrophoresis.  Slight increase light chain ratio which is nonspecific-most likely due to chronic inflammation. ?Recommend 24-hour urine protein electrophoresis. ? ?#Vitamin B12 level is borderline low at 198.  Recommend patient to take over-the-counter vitamin B12 1000 mcg daily. ?Plan to  repeat B12 level at the next visit in 6 months.  If no improvement, can consider B12 injections. ? ?#Elevated ferritin level, likely due to chronic inflammation due to RA. ?Check hemochromatosis mutation s

## 2021-11-05 DIAGNOSIS — E538 Deficiency of other specified B group vitamins: Secondary | ICD-10-CM | POA: Diagnosis not present

## 2021-11-05 DIAGNOSIS — D649 Anemia, unspecified: Secondary | ICD-10-CM | POA: Diagnosis not present

## 2021-11-05 DIAGNOSIS — M069 Rheumatoid arthritis, unspecified: Secondary | ICD-10-CM | POA: Diagnosis not present

## 2021-11-05 DIAGNOSIS — Z87891 Personal history of nicotine dependence: Secondary | ICD-10-CM | POA: Diagnosis not present

## 2021-11-05 DIAGNOSIS — Z803 Family history of malignant neoplasm of breast: Secondary | ICD-10-CM | POA: Diagnosis not present

## 2021-11-05 DIAGNOSIS — R7989 Other specified abnormal findings of blood chemistry: Secondary | ICD-10-CM | POA: Diagnosis not present

## 2021-11-05 DIAGNOSIS — R5383 Other fatigue: Secondary | ICD-10-CM | POA: Diagnosis not present

## 2021-11-05 DIAGNOSIS — D72819 Decreased white blood cell count, unspecified: Secondary | ICD-10-CM | POA: Diagnosis not present

## 2021-11-06 ENCOUNTER — Other Ambulatory Visit: Payer: Self-pay

## 2021-11-06 DIAGNOSIS — R768 Other specified abnormal immunological findings in serum: Secondary | ICD-10-CM

## 2021-11-06 DIAGNOSIS — R7989 Other specified abnormal findings of blood chemistry: Secondary | ICD-10-CM

## 2021-11-07 LAB — IFE+PROTEIN ELECTRO, 24-HR UR
% BETA, Urine: 23.6 %
ALPHA 1 URINE: 4.9 %
Albumin, U: 30.6 %
Alpha 2, Urine: 11 %
GAMMA GLOBULIN URINE: 29.9 %
M-SPIKE %, Urine: 16.1 % — ABNORMAL HIGH
M-Spike, Mg/24 Hr: 40 mg/24 hr — ABNORMAL HIGH
Total Protein, Urine-Ur/day: 249 mg/24 hr — ABNORMAL HIGH (ref 30–150)
Total Protein, Urine: 13.1 mg/dL
Total Volume: 1900

## 2021-11-10 LAB — HEMOCHROMATOSIS DNA-PCR(C282Y,H63D)

## 2021-11-11 ENCOUNTER — Other Ambulatory Visit (HOSPITAL_COMMUNITY): Payer: Self-pay

## 2021-11-12 ENCOUNTER — Other Ambulatory Visit (HOSPITAL_COMMUNITY): Payer: Self-pay

## 2021-11-13 ENCOUNTER — Other Ambulatory Visit (HOSPITAL_COMMUNITY): Payer: Self-pay

## 2021-11-16 ENCOUNTER — Other Ambulatory Visit: Payer: Self-pay | Admitting: Oncology

## 2021-11-16 DIAGNOSIS — R768 Other specified abnormal immunological findings in serum: Secondary | ICD-10-CM

## 2021-11-25 NOTE — Progress Notes (Signed)
? ?Office Visit Note ? ?Patient: Michael Hinton.             ?Date of Birth: [birth year]           ?MRN: [id number]             ?PCP: Danelle Berry, NP ?Referring: Danelle Berry, NP ?Visit Date: 12/09/2021 ?Occupation: '@GUAROCC'$ @ ? ?Subjective:  ?Discuss medication options  ? ?History of Present Illness: Michael Hinton. is a 71 y.o. male with history of seropositive rheumatoid arthritis.  He initiated [place] in the office in 10/30/2021.  He has had a total of 3 doses of Kevzara.  He has been tolerating his are without any side effects or injection site reactions.  He has noticed very minimal improvement in his symptoms.  He continues to have pain, stiffness, and soreness involving multiple joints especially both hands and both knee joints.  He has been taking Tylenol arthritis for symptomatic relief.  He has not been on prednisone recently.  He continues to have significant difficulty with mobility and requested a refill of prednisone to be sent to the pharmacy today until he starts to work. ? ? ?Activities of Daily Living:  ?Patient reports morning stiffness for all day.  ?Patient Reports nocturnal pain.  ?Difficulty dressing/grooming: Reports ?Difficulty climbing stairs: Reports ?Difficulty getting out of chair: Reports ?Difficulty using hands for taps, buttons, cutlery, and/or writing: Reports ? ?Review of Systems  ?Constitutional:  Negative for fatigue.  ?HENT:  Negative for mouth sores, mouth dryness and nose dryness.   ?Eyes:  Negative for pain, itching and dryness.  ?Respiratory:  Negative for shortness of breath and difficulty breathing.   ?Cardiovascular:  Negative for chest pain and palpitations.  ?Gastrointestinal:  Negative for blood in stool, constipation and diarrhea.  ?Endocrine: Negative for increased urination.  ?Genitourinary:  Negative for difficulty urinating.  ?Musculoskeletal:  Positive for joint pain, joint pain, myalgias, morning stiffness, muscle tenderness and myalgias. Negative  for joint swelling.  ?Skin:  Negative for color change, rash and redness.  ?Allergic/Immunologic: Negative for susceptible to infections.  ?Neurological:  Negative for dizziness, numbness, headaches, memory loss and weakness.  ?Hematological:  Negative for bruising/bleeding tendency.  ?Psychiatric/Behavioral:  Negative for confusion.   ? ?PMFS History:  ?Patient Active Problem List  ? Diagnosis Date Noted  ? Vitamin B12 deficiency 11/04/2021  ? DDD (degenerative disc disease), lumbar 03/11/2018  ? ANA positive 01/22/2017  ? Leucopenia 01/22/2017  ? 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 Rheumatoid arthritis involving multiple sites with positive rheumatoid factor ([place]) 06/02/2016  ? High risk medication use 06/02/2016  ? Hypertension 06/02/2016  ? Vitamin D deficiency 06/02/2016  ?  ?Past Medical History:  ?Diagnosis Date  ? Anemia   ? High risk medication use 06/02/2016  ? Hypertension 06/02/2016  ? Neutropenia ([place])   ? Prediabetes   ? Rheumatoid arthritis involving multiple sites with positive rheumatoid factor ([place]) 06/02/2016  ?  ?Family History  ?Problem Relation Age of Onset  ? Heart disease Mother   ? Diabetes Mother   ? Breast cancer Mother   ? Kidney disease Father   ? Diabetes Father   ? Diabetes Daughter   ? ?History reviewed. No pertinent surgical history. ?Social History  ? ?Social History Narrative  ? Not on file  ? ?Immunization History  ?Administered Date(s) Administered  ? Moderna Sars-Covid-2 Vaccination 10/12/2019, 11/15/2019  ?  ? ?Objective: ?Vital Signs: BP 127/70 (BP  Location: Right Arm, Patient Position: Sitting, Cuff Size: Normal)   Pulse 62   Ht '5\' 11"'$  (1.803 m)   Wt 192 lb (87.1 kg)   BMI 26.78 kg/m?   ? ?Physical Exam ?Vitals and nursing note reviewed.  ?Constitutional:   ?   Appearance: He is well-developed.  ?HENT:  ?   Head: Normocephalic and atraumatic.  ?Eyes:  ?   Conjunctiva/sclera: Conjunctivae normal.  ?   Pupils: Pupils are equal, round,  and reactive to light.  ?Cardiovascular:  ?   Rate and Rhythm: Normal rate and regular rhythm.  ?   Heart sounds: Normal heart sounds.  ?Pulmonary:  ?   Effort: Pulmonary effort is normal.  ?   Breath sounds: Normal breath sounds.  ?Abdominal:  ?   General: Bowel sounds are normal.  ?   Palpations: Abdomen is soft.  ?Musculoskeletal:  ?   Cervical back: Normal range of motion and neck supple.  ?Skin: ?   General: Skin is warm and dry.  ?   Capillary Refill: Capillary refill takes less than 2 seconds.  ?Neurological:  ?   Mental Status: He is alert and oriented to person, place, and time.  ?Psychiatric:     ?   Behavior: Behavior normal.  ?  ? ?Musculoskeletal Exam: C-spine has limited range of motion with lateral rotation.  Thoracic kyphosis noted.  No midline spinal tenderness or SI joint tenderness.  Shoulder joints have limited abduction and internal rotation.  Bilateral elbow joint contractures were noted.  Tenderness and warmth of both elbows, left greater than right.  Rheumatoid nodules on the extensor surface of both elbows noted.  Extremely limited range of motion of both wrist joints with some tenderness palpation over the right wrist.  Tenderness over the left CMC joint.  Tenderness and synovitis of the left first, second and third MCPs and fourth and fifth PIP joints.  Incomplete fist formation bilaterally.  PIP and DIP thickening consistent with osteoarthritis of both hands.  Rheumatoid nodules overlying both hands especially the right thumb.  Hip joints have limited range of motion.  Flexion contractures noted in both knees, right greater than left.  Warmth of the right knee was noted.  Both ankle joints have synovial thickening and subluxation bilaterally. ? ?CDAI Exam: ?CDAI Score: 22.2  ?Patient Global: 6 mm; Provider Global: 6 mm ?Swollen: 9 ; Tender: 12  ?Joint Exam 12/09/2021  ? ?   Right  Left  ?Elbow   Tender  Swollen Tender  ?Wrist   Tender     ?MCP 1     Swollen Tender  ?MCP 2  Swollen Tender   Swollen Tender  ?MCP 3  Swollen Tender  Swollen Tender  ?PIP 4     Swollen Tender  ?PIP 5     Swollen Tender  ?Knee  Swollen Tender   Tender  ? ? ? ?Investigation: ?No additional findings. ? ?Imaging: ?No results found. ? ?Recent Labs: ?Lab Results  ?Component Value Date  ? WBC 6.3 10/07/2021  ? HGB 11.5 (L) 10/07/2021  ? PLT 218 10/07/2021  ? NA 136 10/07/2021  ? K 3.2 (L) 10/07/2021  ? CL 104 10/07/2021  ? CO2 24 10/07/2021  ? GLUCOSE 98 10/07/2021  ? BUN 20 10/07/2021  ? CREATININE 1.10 10/07/2021  ? BILITOT 0.4 10/07/2021  ? ALKPHOS 91 10/07/2021  ? AST 32 10/07/2021  ? ALT 23 10/07/2021  ? PROT 8.7 (H) 10/07/2021  ? ALBUMIN 3.7 10/07/2021  ? CALCIUM 9.5  10/07/2021  ? GFRAA 86 02/03/2021  ? QFTBGOLD NEGATIVE 06/16/2017  ? QFTBGOLDPLUS NEGATIVE 02/03/2021  ? ? ?Speciality Comments: No specialty comments available. ? ?Procedures:  ?No procedures performed ?Allergies: Patient has no known allergies.  ? ?Assessment / Plan:     ?Visit Diagnoses: Rheumatoid arthritis involving multiple sites with positive rheumatoid factor [facility]): He has persistent joint tenderness and synovitis involving multiple joints.  He has joint stiffness lasting all day and difficulty with mobility especially due to flexion contractures in both knees right greater than left.  Ongoing warmth in the right knee was noted.  He is currently on Kevzara 200 mg subcutaneous injections every 14 days.  He initiated [place] on 10/30/2021 and has been tolerating it without any side effects or injection site reactions.  He has noticed very minimal improvement in his symptoms since initiating Kevzara.  He has not had any infections since initiating therapy.  He is willing to give Darcus Pester more time to see the full efficacy.  He requested a prednisone taper to treat his current symptoms and to bridge him until Darcus Pester gets in his system.  A prescription for prednisone 20 mg tapering by 5 mg every 4 days was sent to the pharmacy.  He will remain on Kevzara 200  mg sq injections every 14 days.  CBC and CMP were updated today to monitor for drug toxicity.  He will follow-up in the office in 8 weeks or sooner if needed.   ? ?High risk medication use - Kevzara 200

## 2021-11-27 ENCOUNTER — Telehealth: Payer: Self-pay | Admitting: Rheumatology

## 2021-11-27 NOTE — Telephone Encounter (Signed)
He started on kevzara on 10/30/21.  Michael Hinton will take a full 3 months to notice the maximum improvement.  Biologic agents will take about 3 months to reach the maximum efficacy.  I would recommend remaining on Kevzara until his follow-up visit on 12/09/2021.

## 2021-11-27 NOTE — Telephone Encounter (Signed)
Patient advised he started on kevzara on 10/30/21.  Darcus Pester will take a full 3 months to notice the maximum improvement.  Biologic agents will take about 3 months to reach the maximum efficacy.  Patient advised Lovena Le would recommend remaining on Kevzara until his follow-up visit on 12/09/2021. Patient expressed understanding.  ?

## 2021-11-27 NOTE — Telephone Encounter (Signed)
Patient called the office stating Michael Hinton is not working for the pain in his hands. Patient states he cant use his hands in the morning, they are throbbing and very painful. Patient states only ibuprofen helps. Patient requests a return call because he does not want to take his next dose of Kevzara. ?

## 2021-12-04 ENCOUNTER — Other Ambulatory Visit (HOSPITAL_COMMUNITY): Payer: Self-pay

## 2021-12-04 DIAGNOSIS — R339 Retention of urine, unspecified: Secondary | ICD-10-CM | POA: Diagnosis not present

## 2021-12-04 DIAGNOSIS — I10 Essential (primary) hypertension: Secondary | ICD-10-CM | POA: Diagnosis not present

## 2021-12-04 DIAGNOSIS — R7303 Prediabetes: Secondary | ICD-10-CM | POA: Diagnosis not present

## 2021-12-04 DIAGNOSIS — E782 Mixed hyperlipidemia: Secondary | ICD-10-CM | POA: Diagnosis not present

## 2021-12-09 ENCOUNTER — Encounter: Payer: Self-pay | Admitting: Physician Assistant

## 2021-12-09 ENCOUNTER — Ambulatory Visit: Payer: Medicare HMO | Admitting: Physician Assistant

## 2021-12-09 ENCOUNTER — Other Ambulatory Visit (HOSPITAL_COMMUNITY): Payer: Self-pay

## 2021-12-09 VITALS — BP 127/70 | HR 62 | Ht 71.0 in | Wt 192.0 lb

## 2021-12-09 DIAGNOSIS — M24521 Contracture, right elbow: Secondary | ICD-10-CM | POA: Diagnosis not present

## 2021-12-09 DIAGNOSIS — R768 Other specified abnormal immunological findings in serum: Secondary | ICD-10-CM | POA: Diagnosis not present

## 2021-12-09 DIAGNOSIS — M24562 Contracture, left knee: Secondary | ICD-10-CM | POA: Diagnosis not present

## 2021-12-09 DIAGNOSIS — M5136 Other intervertebral disc degeneration, lumbar region: Secondary | ICD-10-CM

## 2021-12-09 DIAGNOSIS — D702 Other drug-induced agranulocytosis: Secondary | ICD-10-CM

## 2021-12-09 DIAGNOSIS — M24522 Contracture, left elbow: Secondary | ICD-10-CM

## 2021-12-09 DIAGNOSIS — Z79899 Other long term (current) drug therapy: Secondary | ICD-10-CM | POA: Diagnosis not present

## 2021-12-09 DIAGNOSIS — I10 Essential (primary) hypertension: Secondary | ICD-10-CM | POA: Diagnosis not present

## 2021-12-09 DIAGNOSIS — M0579 Rheumatoid arthritis with rheumatoid factor of multiple sites without organ or systems involvement: Secondary | ICD-10-CM

## 2021-12-09 DIAGNOSIS — M24561 Contracture, right knee: Secondary | ICD-10-CM

## 2021-12-09 DIAGNOSIS — E559 Vitamin D deficiency, unspecified: Secondary | ICD-10-CM | POA: Diagnosis not present

## 2021-12-09 MED ORDER — PREDNISONE 5 MG PO TABS: ORAL_TABLET | ORAL | 0 refills | Status: DC

## 2021-12-09 NOTE — Patient Instructions (Signed)
Standing Labs ?We placed an order today for your standing lab work.  ? ?Please have your standing labs drawn in August and every 3 months  ? ?If possible, please have your labs drawn 2 weeks prior to your appointment so that the provider can discuss your results at your appointment. ? ?Please note that you may see your imaging and lab results in MyChart before we have reviewed them. ?We may be awaiting multiple results to interpret others before contacting you. ?Please allow our office up to 72 hours to thoroughly review all of the results before contacting the office for clarification of your results. ? ?We have open lab daily: ?Monday through Thursday from 1:30-4:30 PM and Friday from 1:30-4:00 PM ?at the office of Dr. Shaili Deveshwar, [facility].   ?Please be advised, all patients with office appointments requiring lab work will take precedent over walk-in lab work.  ?If possible, please come for your lab work on Monday and Friday afternoons, as you may experience shorter wait times. ?The office is located at [address], [address], [place], [place] [address] ?No appointment is necessary.   ?Labs are drawn by [facility]. Please bring your co-pay at the time of your lab draw.  You may receive a bill from [facility] for your lab work. ? ?Please note if you are on Hydroxychloroquine and and an order has been placed for a Hydroxychloroquine level, you will need to have it drawn 4 hours or more after your last dose. ? ?If you wish to have your labs drawn at another location, please call the office 24 hours in advance to send orders. ? ?If you have any questions regarding directions or hours of operation,  ?please call [phone].   ?As a reminder, please drink plenty of water prior to coming for your lab work. Thanks! ? ?

## 2021-12-10 LAB — CBC WITH DIFFERENTIAL/PLATELET
Absolute Monocytes: 178 cells/uL — ABNORMAL LOW (ref 200–950)
Basophils Absolute: 9 cells/uL (ref 0–200)
Basophils Relative: 0.4 %
Eosinophils Absolute: 48 cells/uL (ref 15–500)
Eosinophils Relative: 2.2 %
HCT: 35 % — ABNORMAL LOW (ref 38.5–50.0)
Hemoglobin: 11.6 g/dL — ABNORMAL LOW (ref 13.2–17.1)
Lymphs Abs: 493 cells/uL — ABNORMAL LOW (ref 850–3900)
MCH: 27.2 pg (ref 27.0–33.0)
MCHC: 33.1 g/dL (ref 32.0–36.0)
MCV: 82 fL (ref 80.0–100.0)
MPV: 10.9 fL (ref 7.5–12.5)
Monocytes Relative: 8.1 %
Neutro Abs: 1472 cells/uL — ABNORMAL LOW (ref 1500–7800)
Neutrophils Relative %: 66.9 %
Platelets: 177 10*3/uL (ref 140–400)
RBC: 4.27 10*6/uL (ref 4.20–5.80)
RDW: 15.1 % — ABNORMAL HIGH (ref 11.0–15.0)
Total Lymphocyte: 22.4 %
WBC: 2.2 10*3/uL — ABNORMAL LOW (ref 3.8–10.8)

## 2021-12-10 LAB — COMPLETE METABOLIC PANEL WITH GFR
AG Ratio: 1.1 (calc) (ref 1.0–2.5)
ALT: 13 U/L (ref 9–46)
AST: 20 U/L (ref 10–35)
Albumin: 3.8 g/dL (ref 3.6–5.1)
Alkaline phosphatase (APISO): 76 U/L (ref 35–144)
BUN: 23 mg/dL (ref 7–25)
CO2: 23 mmol/L (ref 20–32)
Calcium: 9.6 mg/dL (ref 8.6–10.3)
Chloride: 108 mmol/L (ref 98–110)
Creat: 0.99 mg/dL (ref 0.70–1.35)
Globulin: 3.6 g/dL (calc) (ref 1.9–3.7)
Glucose, Bld: 82 mg/dL (ref 65–99)
Potassium: 4.1 mmol/L (ref 3.5–5.3)
Sodium: 139 mmol/L (ref 135–146)
Total Bilirubin: 0.6 mg/dL (ref 0.2–1.2)
Total Protein: 7.4 g/dL (ref 6.1–8.1)
eGFR: 83 mL/min/{1.73_m2} (ref >=60)

## 2021-12-10 NOTE — Progress Notes (Signed)
Patient was initiated on Kevzara on 10/30/21 and has had a total of 3 doses.  His RA remains active-prednisone taper sent yesterday after his appointment.   ?He is due for his next dose of kevzara on 12/14/21.  ?Should be try spacing kevzara beyond every 14 days due to leukopenia?

## 2021-12-10 NOTE — Progress Notes (Signed)
Please advise patient to space Her Injections to Every 3 Weeks Due To Low White Cell Count.  He should have repeat CBC with differential in 4 weeks.

## 2021-12-11 ENCOUNTER — Other Ambulatory Visit: Payer: Self-pay | Admitting: *Deleted

## 2021-12-11 DIAGNOSIS — R7303 Prediabetes: Secondary | ICD-10-CM | POA: Diagnosis not present

## 2021-12-11 DIAGNOSIS — R768 Other specified abnormal immunological findings in serum: Secondary | ICD-10-CM

## 2021-12-11 DIAGNOSIS — M06849 Other specified rheumatoid arthritis, unspecified hand: Secondary | ICD-10-CM | POA: Diagnosis not present

## 2021-12-11 DIAGNOSIS — D509 Iron deficiency anemia, unspecified: Secondary | ICD-10-CM | POA: Diagnosis not present

## 2021-12-11 DIAGNOSIS — Z79899 Other long term (current) drug therapy: Secondary | ICD-10-CM

## 2021-12-11 DIAGNOSIS — D702 Other drug-induced agranulocytosis: Secondary | ICD-10-CM

## 2021-12-11 DIAGNOSIS — N400 Enlarged prostate without lower urinary tract symptoms: Secondary | ICD-10-CM | POA: Diagnosis not present

## 2021-12-11 DIAGNOSIS — M0579 Rheumatoid arthritis with rheumatoid factor of multiple sites without organ or systems involvement: Secondary | ICD-10-CM

## 2021-12-11 MED ORDER — KEVZARA 200 MG/1.14ML ~~LOC~~ SOAJ: 200.0000 mg | SUBCUTANEOUS | 4 refills | Status: DC

## 2021-12-31 ENCOUNTER — Other Ambulatory Visit (HOSPITAL_COMMUNITY): Payer: Self-pay

## 2022-01-01 ENCOUNTER — Other Ambulatory Visit (HOSPITAL_COMMUNITY): Payer: Self-pay

## 2022-01-01 ENCOUNTER — Other Ambulatory Visit: Payer: Self-pay | Admitting: Physician Assistant

## 2022-01-01 DIAGNOSIS — Z79899 Other long term (current) drug therapy: Secondary | ICD-10-CM

## 2022-01-01 DIAGNOSIS — H90A32 Mixed conductive and sensorineural hearing loss, unilateral, left ear with restricted hearing on the contralateral side: Secondary | ICD-10-CM | POA: Diagnosis not present

## 2022-01-01 DIAGNOSIS — M0579 Rheumatoid arthritis with rheumatoid factor of multiple sites without organ or systems involvement: Secondary | ICD-10-CM

## 2022-01-01 MED ORDER — KEVZARA 200 MG/1.14ML ~~LOC~~ SOAJ
200.0000 mg | SUBCUTANEOUS | 1 refills | Status: DC
  Filled 2022-01-02: qty 1.14, 21d supply, fill #0
  Filled 2022-02-16: qty 1.14, 21d supply, fill #1

## 2022-01-01 NOTE — Telephone Encounter (Signed)
Next Visit: 02/17/2022  Last Visit: 12/09/2021  DX: Rheumatoid arthritis involving multiple sites with positive rheumatoid factor   Current Dose per office note 12/09/2021: Kevzara 200 mg subcutaneous injections every 14 days.  He initiated [place] on 10/30/2021.  Labs: 12/09/2021 space  Injections to Every 3 Weeks Due To Low White Cell Count  TB Gold: 02/03/2021 Neg    Okay to refill Kevzara?

## 2022-01-02 ENCOUNTER — Other Ambulatory Visit (HOSPITAL_COMMUNITY): Payer: Self-pay

## 2022-01-02 ENCOUNTER — Other Ambulatory Visit (HOSPITAL_COMMUNITY): Payer: Self-pay | Admitting: Otolaryngology

## 2022-01-02 ENCOUNTER — Other Ambulatory Visit: Payer: Self-pay | Admitting: Otolaryngology

## 2022-01-02 DIAGNOSIS — H90A21 Sensorineural hearing loss, unilateral, right ear, with restricted hearing on the contralateral side: Secondary | ICD-10-CM

## 2022-01-08 ENCOUNTER — Other Ambulatory Visit (HOSPITAL_COMMUNITY): Payer: Self-pay

## 2022-01-08 ENCOUNTER — Telehealth: Payer: Self-pay | Admitting: Rheumatology

## 2022-01-08 ENCOUNTER — Ambulatory Visit
Admission: EM | Admit: 2022-01-08 | Discharge: 2022-01-08 | Disposition: A | Discharge status: Other Acute Inpatient Hospital | Payer: Medicare HMO

## 2022-01-08 MED ORDER — PREDNISONE 5 MG PO TABS
ORAL_TABLET | ORAL | 0 refills | Status: DC
Start: 2022-01-08 — End: 2022-01-27

## 2022-01-08 NOTE — Telephone Encounter (Signed)
I received a message from the answering service that the patient's wife called "patient was having a flare . His hands are swollen and is in a lot of pain." She requested a prednisone taper for the flare. I returned the phone call but did not get a response. I sent a prednisone taper as prescribed before in May, starting at prednisone 20 mg po qd and taper by 5 mg po a q 4 days.Please call to check on the patient tomorrow. Thank you, Bo Merino, MD

## 2022-01-08 NOTE — ED Provider Notes (Signed)
Discussed with patient unable to prescribe prednisone due he is under the care of Rheumatology. Advised to follow-up with rheumatology tomorrow. Patient and spouse verbalized understanding and agreement with plan.    Scot Jun, [place] 01/08/22 [phone]

## 2022-01-09 ENCOUNTER — Telehealth: Payer: Self-pay | Admitting: Rheumatology

## 2022-01-09 NOTE — Telephone Encounter (Signed)
Patients wife left a voicemail stating Michael Hinton had a flare start yesterday and is swollen and painful. She requests prednisone stating it helped last time. [facility] in [place]. She requests a call if it can be sent or if there are questions. [phone]

## 2022-01-09 NOTE — Telephone Encounter (Signed)
Spoke with patient's wife and advised prescription for Prednisone was sent to the pharmacy yesterday evening. She states they were not aware so the prescription has not been picked up yet. She states she will have him pick up the prescription to start. Advised her to call the office if he is still having trouble after he has completed the Prednisone taper. Left a message to advise patient as well. Patient's wife states that she is not sure if this is related but Arby did not start complaining of the hand pain and swelling until after having a [employer] sandwich. She states that he had one yesterday afternoon and then again yesterday evening.

## 2022-01-09 NOTE — Telephone Encounter (Signed)
Spoke with patient's wife and advised prescription for Prednisone was sent to the pharmacy yesterday evening. She states they were not aware so the prescription has not been picked up yet. She states she will have him pick up the prescription to start. Advised her to call the office if he is still having trouble after he has completed the Prednisone taper. Left a message to advise patient as well. Patient's wife states that she is not sure if this is related but Sender did not start complaining of the hand pain and swelling until after having a [employer] sandwich. She states that he had one yesterday afternoon and then again yesterday evening.

## 2022-01-14 ENCOUNTER — Ambulatory Visit
Admission: RE | Admit: 2022-01-14 | Discharge: 2022-01-14 | Disposition: A | Discharge status: Home / Self Care | Payer: Medicare HMO | Source: Ambulatory Visit | Attending: Otolaryngology | Admitting: Otolaryngology

## 2022-01-14 DIAGNOSIS — H90A21 Sensorineural hearing loss, unilateral, right ear, with restricted hearing on the contralateral side: Secondary | ICD-10-CM | POA: Diagnosis not present

## 2022-01-14 DIAGNOSIS — I639 Cerebral infarction, unspecified: Secondary | ICD-10-CM | POA: Diagnosis not present

## 2022-01-14 DIAGNOSIS — I6782 Cerebral ischemia: Secondary | ICD-10-CM | POA: Diagnosis not present

## 2022-01-14 DIAGNOSIS — H9042 Sensorineural hearing loss, unilateral, left ear, with unrestricted hearing on the contralateral side: Secondary | ICD-10-CM | POA: Diagnosis not present

## 2022-01-14 DIAGNOSIS — J341 Cyst and mucocele of nose and nasal sinus: Secondary | ICD-10-CM | POA: Diagnosis not present

## 2022-01-14 MED ORDER — GADOBUTROL 1 MMOL/ML IV SOLN
7.5000 mL | Freq: Once | INTRAVENOUS | Status: AC | PRN
  Administered 2022-01-14: 7.5 mL via INTRAVENOUS

## 2022-01-23 DIAGNOSIS — G459 Transient cerebral ischemic attack, unspecified: Secondary | ICD-10-CM | POA: Diagnosis not present

## 2022-01-23 DIAGNOSIS — I10 Essential (primary) hypertension: Secondary | ICD-10-CM | POA: Diagnosis not present

## 2022-01-23 DIAGNOSIS — E782 Mixed hyperlipidemia: Secondary | ICD-10-CM | POA: Diagnosis not present

## 2022-01-23 DIAGNOSIS — M069 Rheumatoid arthritis, unspecified: Secondary | ICD-10-CM | POA: Diagnosis not present

## 2022-01-26 ENCOUNTER — Telehealth: Payer: Self-pay | Admitting: Rheumatology

## 2022-01-26 ENCOUNTER — Other Ambulatory Visit (HOSPITAL_COMMUNITY): Payer: Self-pay

## 2022-01-26 NOTE — Telephone Encounter (Signed)
Patient called the office requesting a refill of Prednisone. Patient states its the only thing that helps him and the Darcus Pester is not working. [facility] in [place].

## 2022-01-27 MED ORDER — PREDNISONE 5 MG PO TABS: 5.0000 mg | ORAL_TABLET | Freq: Every day | ORAL | 0 refills | Status: DC

## 2022-01-27 NOTE — Telephone Encounter (Signed)
Ok to send in prednisone 5 mg 1 tablet by mouth daily until his follow up visit.  We can further discuss treatment options at his follow up visit.  He should take prednisone in the morning with food.  Avoid the use of NSAIDs.

## 2022-01-27 NOTE — Telephone Encounter (Signed)
Left message to advise patient we are sending in prednisone 5 mg 1 tablet by mouth daily until his follow up visit.  We can further discuss treatment options at his follow up visit.  He should take prednisone in the morning with food.  Avoid the use of NSAIDs.

## 2022-01-28 ENCOUNTER — Other Ambulatory Visit (HOSPITAL_COMMUNITY): Payer: Self-pay

## 2022-02-04 NOTE — Telephone Encounter (Signed)
Prescription was sent to the pharmacy

## 2022-02-06 NOTE — Progress Notes (Unsigned)
Office Visit Note  Patient: Michael Hinton.             Date of Birth: 09/29/1950           MRN: [id number]             PCP: Danelle Berry, NP Referring: Danelle Berry, NP Visit Date: 02/17/2022 Occupation: '@GUAROCC'$ @  Subjective:  Discuss mediation options   History of Present Illness: Michael Hinton. is a 71 y.o. male with history of seropositive rheumatoid arthritis and DDD.  Patient continues to experience recurrent flares involving multiple joints. He remains kevzara 200 mg sq injections every 18 days and is taking prednisone 5 mg daily.  He has been tolerating Kevzara without any side effects or injection site reactions.  He has not missed any doses recently.  He reports that he has not noticed any joint swelling recently.  He has been experiencing ongoing pain in his hands and knee joints which is exacerbated by his diet.  He states he has been having flares if he eats red meat or has excessive sugar intake. He denies any recent infections.    Activities of Daily Living:  Patient reports morning stiffness for several hours.   Patient Reports nocturnal pain.  Difficulty dressing/grooming: Reports Difficulty climbing stairs: Reports Difficulty getting out of chair: Reports Difficulty using hands for taps, buttons, cutlery, and/or writing: Reports  Review of Systems  Constitutional:  Negative for fatigue.  HENT:  Negative for mouth sores, mouth dryness and nose dryness.   Eyes:  Negative for pain, itching and dryness.  Respiratory:  Negative for shortness of breath and difficulty breathing.   Cardiovascular:  Negative for chest pain and palpitations.  Gastrointestinal:  Negative for blood in stool, constipation and diarrhea.  Endocrine: Negative for increased urination.  Genitourinary:  Negative for difficulty urinating.  Musculoskeletal:  Positive for joint pain, joint pain, myalgias, morning stiffness, muscle tenderness and myalgias. Negative for joint  swelling.  Skin:  Negative for color change, rash and redness.  Allergic/Immunologic: Negative for susceptible to infections.  Neurological:  Negative for dizziness, numbness, headaches, memory loss and weakness.  Hematological:  Negative for bruising/bleeding tendency.  Psychiatric/Behavioral:  Negative for confusion.     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   Rheumatoid arthritis involving multiple sites with positive rheumatoid factor ([place]) 06/02/2016   High risk medication use 06/02/2016   Hypertension 06/02/2016   Vitamin D deficiency 06/02/2016    Past Medical History:  Diagnosis Date   Anemia    High risk medication use 06/02/2016   Hypertension 06/02/2016   Neutropenia (HCC)    Prediabetes    Rheumatoid arthritis involving multiple sites with positive rheumatoid factor ([place]) 06/02/2016    Family History  Problem Relation Age of Onset   Heart disease Mother    Diabetes Mother    Breast cancer Mother    Kidney disease Father    Diabetes Father    Diabetes Daughter    History reviewed. No pertinent surgical history. Social History   Social History Narrative   Not on file   Immunization History  Administered Date(s) Administered   Moderna Sars-Covid-2 Vaccination 10/12/2019, 11/15/2019     Objective: Vital Signs: BP 118/73 (BP Location:  Left Arm, Patient Position: Sitting, Cuff Size: Normal)   Pulse 85   Ht '5\' 11"'$  (1.803 m)   Wt 189 lb (85.7 kg)   BMI 26.36 kg/m    Physical Exam Vitals and nursing note reviewed.  Constitutional:      Appearance: He is well-developed.  HENT:     Head: Normocephalic and atraumatic.  Eyes:     Conjunctiva/sclera: Conjunctivae normal.     Pupils: Pupils are equal, round, and  reactive to light.  Cardiovascular:     Rate and Rhythm: Normal rate and regular rhythm.     Heart sounds: Normal heart sounds.  Pulmonary:     Effort: Pulmonary effort is normal.     Breath sounds: Normal breath sounds.  Abdominal:     General: Bowel sounds are normal.     Palpations: Abdomen is soft.  Musculoskeletal:     Cervical back: Normal range of motion and neck supple.  Skin:    General: Skin is warm and dry.     Capillary Refill: Capillary refill takes less than 2 seconds.  Neurological:     Mental Status: He is alert and oriented to person, place, and time.  Psychiatric:        Behavior: Behavior normal.      Musculoskeletal Exam: C-spine has limited range of motion with lateral rotation.  Shoulder joints have limited abduction with crepitus bilaterally.  Bilateral upper joint contractures noted.  Warmth and tenderness of the left elbow joint.  Small rheumatoid nodules noted on the extensor surface of the right elbow.  Limited range of motion of both wrist joints.  Thickening of all MCP joints.  Incomplete fist formation.  PIP and DIP thickening noted.  Rheumatoid nodules over both hands, largest on the right thumb.  No tenderness or synovitis of MCP joints on examination today.  Limited ROM of both knee joints.  Warmth of right knee.  No knee joint effusion noted.  Both ankle joints have synovial thickening and subluxation bilaterally.  CDAI Exam: CDAI Score: 0.8  Patient Global: 4 mm; Provider Global: 4 mm Swollen: 0 ; Tender: 0  Joint Exam 02/17/2022   No joint exam has been documented for this visit   There is currently no information documented on the homunculus. Go to the Rheumatology activity and complete the homunculus joint exam.  Investigation: No additional findings.  Imaging: No results found.  Recent Labs: Lab Results  Component Value Date   WBC 2.2 (L) 12/09/2021   HGB 11.6 (L) 12/09/2021   PLT 177 12/09/2021   NA 139 12/09/2021   K 4.1  12/09/2021   CL 108 12/09/2021   CO2 23 12/09/2021   GLUCOSE 82 12/09/2021   BUN 23 12/09/2021   CREATININE 0.99 12/09/2021   BILITOT 0.6 12/09/2021   ALKPHOS 91 10/07/2021   AST 20 12/09/2021   ALT 13 12/09/2021   PROT 7.4 12/09/2021   ALBUMIN 3.7 10/07/2021   CALCIUM 9.6 12/09/2021   GFRAA 86 02/03/2021   QFTBGOLD NEGATIVE 06/16/2017   QFTBGOLDPLUS NEGATIVE 02/03/2021    Speciality Comments: No specialty comments available.  Procedures:  No procedures performed Allergies: Patient has no known allergies.    Assessment / Plan:     Visit Diagnoses: Rheumatoid arthritis involving multiple sites with positive rheumatoid factor [facility]): Patient continues to experience intermittent pain and stiffness in both hands and both knee joints.  He is currently on Kevzara 200 mg subcutaneous injections every 18 days and prednisone 5 mg 1 tablet  daily.  He has been spacing the dose of Kevzara due to history of leukopenia.  He has noticed a significant improvement in his joint pain and inflammation since remaining on prednisone 5 mg daily.  He has not noticed any joint swelling unless he eats red meat or has excessive sugar intake.  Overall he feels that his rheumatoid arthritis is better controlled on Kevzara and prednisone as combination therapy.  He does not want to make any medication changes at this time.  CBC and CMP were drawn today to monitor for drug toxicity.  He will continue to require frequent lab monitoring due to history of leukopenia and anemia. Discussed the risks of long-term prednisone use.  He will remain on low-dose prednisone 5 mg daily. Discussed that his treatment options are limited especially given his history of leukopenia.  He previously was spacing Enbrel to every 15 days with an inadequate response.  He cannot tolerate taking Humira and discontinued after 1 dose. He plans on trying to change his diet by avoiding red meat and other pro inflammatory foods.  Discussed trying to  eat a more plant-based diet or trying to follow a Mediterranean style diet. If he has an inadequate response to [place] we can further discuss possibly switching to O[place] or low-dose Xeljanz 5 mg daily in the future.  He will follow-up in the office in 2 to 3 months or sooner if needed.  High risk medication use - Kevzara 200 mg subcutaneous injections every 18 days.  He initiated [place] on 10/30/2021.  Spacing Kevzara dosing due to low white blood cell count and anemia. Inadequate response to: Enbrel 50 mg sq injections every 15 days-spacing due to long standing history of neutropenia.  Discontinued Humira due to experiencing numbness in his left thumb with the initial dose on 09/30/2021. He has not had any recent or recurrent infections.  Discussed the importance of holding kevzara if he develops signs or symptoms of an infection and to resume once the infection has completely cleared. CBC and CMP were drawn on 12/09/2021.  Orders for CBC and CMP were released today.  His next lab work will be due in October and every 3 months to monitor for drug toxicity.- Plan: COMPLETE METABOLIC PANEL WITH GFR, CBC with Differential/Platelet, QuantiFERON-TB Gold Plus TB Gold negative on 02/03/2021.  Order for TB gold released today.  Screening for tuberculosis - Order for TB gold released today. 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  Absolute neutrophils, lymphocytes, and monocytes were low.  The patient has been spacing Kevzara to every 18 days.  CBC with differential was ordered today for further evaluation.  He will continue to require frequent lab monitoring.  ANA positive: No clinical features of systemic lupus at this time.  Contracture of left elbow: Chronic.  He has ongoing warmth of the left elbow joint.  Contracture of right elbow: Chronic.  He has less tenderness and no synovitis on examination today.  Rheumatoid nodule  palpable extensor surface of the right elbow noted.  Contractures of both knees: Flexion contractures of both knees.  Warmth of the right knee noted.  No effusion noted on examination today.  Vitamin D deficiency: He is taking vitamin D 5000 units daily.  Primary hypertension: Blood pressure was 118/73 today in the office.  Spinal stenosis of lumbar region without neurogenic claudication: No midline spinal tenderness on examination today.  DDD (degenerative disc disease), lumbar: He experiences stiffness in  his lower back.  No midline spinal tenderness on examination today.   Orders: Orders Placed This Encounter  Procedures   COMPLETE METABOLIC PANEL WITH GFR   CBC with Differential/Platelet   QuantiFERON-TB Gold Plus   Meds ordered this encounter  Medications   predniSONE (DELTASONE) 5 MG tablet    Sig: Take 1 tablet (5 mg total) by mouth daily with breakfast.    Dispense:  30 tablet    Refill:  1     Follow-Up Instructions: Return in about 2 months (around 04/20/2022) for Rheumatoid arthritis, DDD.   Ofilia Neas, PA-C  Note - This record has been created using Dragon software.  Chart creation errors have been sought, but may not always  have been located. Such creation errors do not reflect on  the standard of medical care.

## 2022-02-16 ENCOUNTER — Other Ambulatory Visit (HOSPITAL_COMMUNITY): Payer: Self-pay

## 2022-02-17 ENCOUNTER — Ambulatory Visit: Payer: Medicare HMO | Admitting: Physician Assistant

## 2022-02-17 ENCOUNTER — Encounter: Payer: Self-pay | Admitting: Physician Assistant

## 2022-02-17 VITALS — BP 118/73 | HR 85 | Ht 71.0 in | Wt 189.0 lb

## 2022-02-17 DIAGNOSIS — Z111 Encounter for screening for respiratory tuberculosis: Secondary | ICD-10-CM

## 2022-02-17 DIAGNOSIS — M24522 Contracture, left elbow: Secondary | ICD-10-CM

## 2022-02-17 DIAGNOSIS — Z79899 Other long term (current) drug therapy: Secondary | ICD-10-CM | POA: Diagnosis not present

## 2022-02-17 DIAGNOSIS — M51369 Other intervertebral disc degeneration, lumbar region without mention of lumbar back pain or lower extremity pain: Secondary | ICD-10-CM

## 2022-02-17 DIAGNOSIS — R7689 Other specified abnormal immunological findings in serum: Secondary | ICD-10-CM

## 2022-02-17 DIAGNOSIS — M48061 Spinal stenosis, lumbar region without neurogenic claudication: Secondary | ICD-10-CM

## 2022-02-17 DIAGNOSIS — M24561 Contracture, right knee: Secondary | ICD-10-CM

## 2022-02-17 DIAGNOSIS — R768 Other specified abnormal immunological findings in serum: Secondary | ICD-10-CM | POA: Diagnosis not present

## 2022-02-17 DIAGNOSIS — M0579 Rheumatoid arthritis with rheumatoid factor of multiple sites without organ or systems involvement: Secondary | ICD-10-CM | POA: Diagnosis not present

## 2022-02-17 DIAGNOSIS — I10 Essential (primary) hypertension: Secondary | ICD-10-CM | POA: Diagnosis not present

## 2022-02-17 DIAGNOSIS — M5136 Other intervertebral disc degeneration, lumbar region: Secondary | ICD-10-CM | POA: Diagnosis not present

## 2022-02-17 DIAGNOSIS — M24521 Contracture, right elbow: Secondary | ICD-10-CM

## 2022-02-17 DIAGNOSIS — D702 Other drug-induced agranulocytosis: Secondary | ICD-10-CM

## 2022-02-17 DIAGNOSIS — E559 Vitamin D deficiency, unspecified: Secondary | ICD-10-CM | POA: Diagnosis not present

## 2022-02-17 DIAGNOSIS — M24562 Contracture, left knee: Secondary | ICD-10-CM

## 2022-02-17 MED ORDER — PREDNISONE 5 MG PO TABS: 5.0000 mg | ORAL_TABLET | Freq: Every day | ORAL | 1 refills | Status: DC

## 2022-02-18 DIAGNOSIS — I4891 Unspecified atrial fibrillation: Secondary | ICD-10-CM | POA: Diagnosis not present

## 2022-02-18 DIAGNOSIS — I639 Cerebral infarction, unspecified: Secondary | ICD-10-CM | POA: Diagnosis not present

## 2022-02-18 NOTE — Progress Notes (Signed)
WBC count remains borderline low but has improved.  Absolute lymphocytes remain low-stable.  Anemia has worsened.   Albumin is low  and globulin is borderline elevated.   Recheck lab work in 72 month.  Please add iron panel.

## 2022-02-19 LAB — COMPLETE METABOLIC PANEL WITH GFR
AG Ratio: 0.9 (calc) — ABNORMAL LOW (ref 1.0–2.5)
ALT: 38 U/L (ref 9–46)
AST: 32 U/L (ref 10–35)
Albumin: 3.4 g/dL — ABNORMAL LOW (ref 3.6–5.1)
Alkaline phosphatase (APISO): 86 U/L (ref 35–144)
BUN/Creatinine Ratio: 23 (calc) — ABNORMAL HIGH (ref 6–22)
BUN: 28 mg/dL — ABNORMAL HIGH (ref 7–25)
CO2: 24 mmol/L (ref 20–32)
Calcium: 9.8 mg/dL (ref 8.6–10.3)
Chloride: 104 mmol/L (ref 98–110)
Creat: 1.22 mg/dL (ref 0.70–1.35)
Globulin: 3.9 g/dL (calc) — ABNORMAL HIGH (ref 1.9–3.7)
Glucose, Bld: 80 mg/dL (ref 65–99)
Potassium: 4.2 mmol/L (ref 3.5–5.3)
Sodium: 138 mmol/L (ref 135–146)
Total Bilirubin: 0.5 mg/dL (ref 0.2–1.2)
Total Protein: 7.3 g/dL (ref 6.1–8.1)
eGFR: 65 mL/min/{1.73_m2} (ref >=60)

## 2022-02-19 LAB — CBC WITH DIFFERENTIAL/PLATELET
Absolute Monocytes: 229 cells/uL (ref 200–950)
Basophils Absolute: 11 cells/uL (ref 0–200)
Basophils Relative: 0.3 %
Eosinophils Absolute: 81 cells/uL (ref 15–500)
Eosinophils Relative: 2.2 %
HCT: 32.1 % — ABNORMAL LOW (ref 38.5–50.0)
Hemoglobin: 10.8 g/dL — ABNORMAL LOW (ref 13.2–17.1)
Lymphs Abs: 500 cells/uL — ABNORMAL LOW (ref 850–3900)
MCH: 27.9 pg (ref 27.0–33.0)
MCHC: 33.6 g/dL (ref 32.0–36.0)
MCV: 82.9 fL (ref 80.0–100.0)
MPV: 8.9 fL (ref 7.5–12.5)
Monocytes Relative: 6.2 %
Neutro Abs: 2879 cells/uL (ref 1500–7800)
Neutrophils Relative %: 77.8 %
Platelets: 254 10*3/uL (ref 140–400)
RBC: 3.87 10*6/uL — ABNORMAL LOW (ref 4.20–5.80)
RDW: 13.8 % (ref 11.0–15.0)
Total Lymphocyte: 13.5 %
WBC: 3.7 10*3/uL — ABNORMAL LOW (ref 3.8–10.8)

## 2022-02-19 LAB — QUANTIFERON-TB GOLD PLUS
Mitogen-NIL: 0.06 IU/mL
NIL: 0.02 IU/mL
QuantiFERON-TB Gold Plus: UNDETERMINED — AB
TB1-NIL: 0 IU/mL
TB2-NIL: 0 IU/mL

## 2022-02-19 NOTE — Progress Notes (Signed)
TB gold is indeterminate.  Please advise patient to return in 1-2 weeks to recheck TB gold.   If TB remains indeterminate TB skin testing will need to be performed.

## 2022-02-20 ENCOUNTER — Telehealth: Payer: Self-pay | Admitting: *Deleted

## 2022-02-20 DIAGNOSIS — Z79899 Other long term (current) drug therapy: Secondary | ICD-10-CM

## 2022-02-20 DIAGNOSIS — Z9225 Personal history of immunosupression therapy: Secondary | ICD-10-CM

## 2022-02-20 DIAGNOSIS — Z111 Encounter for screening for respiratory tuberculosis: Secondary | ICD-10-CM

## 2022-02-20 DIAGNOSIS — D649 Anemia, unspecified: Secondary | ICD-10-CM

## 2022-02-20 NOTE — Telephone Encounter (Signed)
-----   Message from Ofilia Neas, PA-C sent at 02/18/2022  8:11 AM EDT ----- WBC count remains borderline low but has improved.  Absolute lymphocytes remain low-stable.  Anemia has worsened.   Albumin is low  and globulin is borderline elevated.   Recheck lab work in 83 month.  Please add iron panel.

## 2022-02-23 ENCOUNTER — Other Ambulatory Visit (HOSPITAL_COMMUNITY): Payer: Self-pay

## 2022-03-05 ENCOUNTER — Telehealth: Payer: Self-pay | Admitting: Rheumatology

## 2022-03-05 DIAGNOSIS — Z79899 Other long term (current) drug therapy: Secondary | ICD-10-CM

## 2022-03-05 NOTE — Telephone Encounter (Signed)
Patient's wife advised okay to add uric acid to routine lab work. Advised if patient continues to flare please schedule a visit with Dr. Estanislado Pandy for further evaluation.  I have seen the patient in the office the last 5 visits.  He has not seen Dr. Estanislado Pandy in the office on 02/03/2021 so I would like her opinion if he continues to flare despite the current regimen. Felecia expressed understanding.

## 2022-03-05 NOTE — Telephone Encounter (Signed)
Patients wife called the office stating Michael Hinton keeps flaring and she believes it may be gout. She states she would like to have his uric acid rechecked. She states the only thing that is helping him is Prednisone and he is taking 2 pills a day. She requests a call back to discuss if a higher Prednisone dose is needed.

## 2022-03-05 NOTE — Telephone Encounter (Signed)
Ok to add uric acid to routine lab work. If he continues to flare please schedule a visit with Dr. Estanislado Pandy for further evaluation.  I have seen the patient in the office the last 5 visits.  He has not seen Dr. Estanislado Pandy in the office on 02/03/2021 so I would like her opinion if he continues to flare despite the current regimen.

## 2022-03-06 ENCOUNTER — Other Ambulatory Visit: Payer: Self-pay | Admitting: *Deleted

## 2022-03-06 DIAGNOSIS — Z9225 Personal history of immunosupression therapy: Secondary | ICD-10-CM

## 2022-03-06 DIAGNOSIS — Z111 Encounter for screening for respiratory tuberculosis: Secondary | ICD-10-CM

## 2022-03-06 DIAGNOSIS — D649 Anemia, unspecified: Secondary | ICD-10-CM | POA: Diagnosis not present

## 2022-03-06 DIAGNOSIS — Z79899 Other long term (current) drug therapy: Secondary | ICD-10-CM | POA: Diagnosis not present

## 2022-03-08 NOTE — Progress Notes (Signed)
Uric acid is in the desirable range.

## 2022-03-09 ENCOUNTER — Other Ambulatory Visit (HOSPITAL_COMMUNITY): Payer: Self-pay

## 2022-03-09 ENCOUNTER — Other Ambulatory Visit: Payer: Self-pay | Admitting: Physician Assistant

## 2022-03-09 DIAGNOSIS — Z79899 Other long term (current) drug therapy: Secondary | ICD-10-CM

## 2022-03-09 DIAGNOSIS — M0579 Rheumatoid arthritis with rheumatoid factor of multiple sites without organ or systems involvement: Secondary | ICD-10-CM

## 2022-03-09 NOTE — Progress Notes (Signed)
Iron is WNL. Ferritin is elevated-likely due to chronic inflammation secondary to active rheumatoid arthritis.   CMP stable. WBC count remains low.  Absolute monocytes and lymphocytes are low.  RBC count, hgb, and hct remain low and continue to trend down.  Please notify the patient and forward results to his hematologist.

## 2022-03-10 ENCOUNTER — Other Ambulatory Visit (HOSPITAL_COMMUNITY): Payer: Self-pay

## 2022-03-10 NOTE — Telephone Encounter (Signed)
Next Visit: 05/06/2022  Last Visit: 02/17/2022  Last Fill: 01/01/2022  DX: Rheumatoid arthritis involving multiple sites with positive rheumatoid factor   Current Dose per office note 02/17/2022: Kevzara 200 mg subcutaneous injections every 18 days   Labs: 03/06/2022 Iron is WNL. Ferritin is elevated-likely due to chronic inflammation secondary to active rheumatoid arthritis.  CMP stable. WBC count remains low.  Absolute monocytes and lymphocytes are low.  RBC count, hgb, and hct remain low and continue to trend down.   TB Gold: 02/17/2022 Indeterminate, (Redrawn 03/06/2022, results pending)   Okay to refill Kevzara?

## 2022-03-10 NOTE — Telephone Encounter (Signed)
Hold off on sending refill until TB gold has resulted.

## 2022-03-11 LAB — COMPLETE METABOLIC PANEL WITH GFR
AG Ratio: 0.9 (calc) — ABNORMAL LOW (ref 1.0–2.5)
ALT: 32 U/L (ref 9–46)
AST: 26 U/L (ref 10–35)
Albumin: 3.3 g/dL — ABNORMAL LOW (ref 3.6–5.1)
Alkaline phosphatase (APISO): 81 U/L (ref 35–144)
BUN/Creatinine Ratio: 25 (calc) — ABNORMAL HIGH (ref 6–22)
BUN: 29 mg/dL — ABNORMAL HIGH (ref 7–25)
CO2: 24 mmol/L (ref 20–32)
Calcium: 9.5 mg/dL (ref 8.6–10.3)
Chloride: 105 mmol/L (ref 98–110)
Creat: 1.14 mg/dL (ref 0.70–1.35)
Globulin: 3.5 g/dL (calc) (ref 1.9–3.7)
Glucose, Bld: 89 mg/dL (ref 65–99)
Potassium: 4.1 mmol/L (ref 3.5–5.3)
Sodium: 137 mmol/L (ref 135–146)
Total Bilirubin: 0.4 mg/dL (ref 0.2–1.2)
Total Protein: 6.8 g/dL (ref 6.1–8.1)
eGFR: 70 mL/min/{1.73_m2} (ref >=60)

## 2022-03-11 LAB — QUANTIFERON-TB GOLD PLUS
Mitogen-NIL: 0.03 IU/mL
NIL: 0.01 IU/mL
QuantiFERON-TB Gold Plus: UNDETERMINED — AB
TB1-NIL: 0 IU/mL
TB2-NIL: 0 IU/mL

## 2022-03-11 LAB — CBC WITH DIFFERENTIAL/PLATELET
Absolute Monocytes: 180 cells/uL — ABNORMAL LOW (ref 200–950)
Basophils Absolute: 10 cells/uL (ref 0–200)
Basophils Relative: 0.4 %
Eosinophils Absolute: 40 cells/uL (ref 15–500)
Eosinophils Relative: 1.6 %
HCT: 30.9 % — ABNORMAL LOW (ref 38.5–50.0)
Hemoglobin: 10.1 g/dL — ABNORMAL LOW (ref 13.2–17.1)
Lymphs Abs: 520 cells/uL — ABNORMAL LOW (ref 850–3900)
MCH: 28 pg (ref 27.0–33.0)
MCHC: 32.7 g/dL (ref 32.0–36.0)
MCV: 85.6 fL (ref 80.0–100.0)
MPV: 9.1 fL (ref 7.5–12.5)
Monocytes Relative: 7.2 %
Neutro Abs: 1750 cells/uL (ref 1500–7800)
Neutrophils Relative %: 70 %
Platelets: 222 10*3/uL (ref 140–400)
RBC: 3.61 10*6/uL — ABNORMAL LOW (ref 4.20–5.80)
RDW: 13.5 % (ref 11.0–15.0)
Total Lymphocyte: 20.8 %
WBC: 2.5 10*3/uL — ABNORMAL LOW (ref 3.8–10.8)

## 2022-03-11 LAB — IRON,TIBC AND FERRITIN PANEL
%SAT: 37 % (calc) (ref 20–48)
Ferritin: 1156 ng/mL — ABNORMAL HIGH (ref 24–380)
Iron: 87 ug/dL (ref 50–180)
TIBC: 238 mcg/dL (calc) — ABNORMAL LOW (ref 250–425)

## 2022-03-11 LAB — URIC ACID: Uric Acid, Serum: 4.1 mg/dL (ref 4.0–8.0)

## 2022-03-12 ENCOUNTER — Other Ambulatory Visit (HOSPITAL_COMMUNITY): Payer: Self-pay

## 2022-03-12 ENCOUNTER — Other Ambulatory Visit: Payer: Self-pay | Admitting: Physician Assistant

## 2022-03-12 DIAGNOSIS — Z79899 Other long term (current) drug therapy: Secondary | ICD-10-CM

## 2022-03-12 DIAGNOSIS — M0579 Rheumatoid arthritis with rheumatoid factor of multiple sites without organ or systems involvement: Secondary | ICD-10-CM

## 2022-03-12 NOTE — Progress Notes (Signed)
Repeat TB gold is indeterminate.  Patient will need a PPD.

## 2022-03-16 ENCOUNTER — Other Ambulatory Visit (HOSPITAL_COMMUNITY): Payer: Self-pay

## 2022-03-20 ENCOUNTER — Other Ambulatory Visit (HOSPITAL_COMMUNITY): Payer: Self-pay

## 2022-03-20 ENCOUNTER — Ambulatory Visit (LOCAL_COMMUNITY_HEALTH_CENTER): Payer: Self-pay

## 2022-03-20 DIAGNOSIS — Z111 Encounter for screening for respiratory tuberculosis: Secondary | ICD-10-CM

## 2022-03-20 NOTE — Progress Notes (Signed)
In nurse clinic for PPD as recommended by rheumatologist. Pt had QFT x 2 in July 2023, both results indeterminate (per Epic). Provider wants to f-u with ppd. Josie Saunders, RN

## 2022-03-23 ENCOUNTER — Ambulatory Visit (LOCAL_COMMUNITY_HEALTH_CENTER): Payer: Self-pay

## 2022-03-23 DIAGNOSIS — Z111 Encounter for screening for respiratory tuberculosis: Secondary | ICD-10-CM

## 2022-03-23 LAB — TB SKIN TEST
Induration: 0 mm
TB Skin Test: NEGATIVE

## 2022-03-26 DIAGNOSIS — R339 Retention of urine, unspecified: Secondary | ICD-10-CM | POA: Diagnosis not present

## 2022-03-26 DIAGNOSIS — R7303 Prediabetes: Secondary | ICD-10-CM | POA: Diagnosis not present

## 2022-03-26 DIAGNOSIS — E1165 Type 2 diabetes mellitus with hyperglycemia: Secondary | ICD-10-CM | POA: Diagnosis not present

## 2022-03-26 DIAGNOSIS — D509 Iron deficiency anemia, unspecified: Secondary | ICD-10-CM | POA: Diagnosis not present

## 2022-03-26 DIAGNOSIS — I10 Essential (primary) hypertension: Secondary | ICD-10-CM | POA: Diagnosis not present

## 2022-03-26 DIAGNOSIS — E785 Hyperlipidemia, unspecified: Secondary | ICD-10-CM | POA: Diagnosis not present

## 2022-03-26 DIAGNOSIS — M06841 Other specified rheumatoid arthritis, right hand: Secondary | ICD-10-CM | POA: Diagnosis not present

## 2022-03-26 DIAGNOSIS — E782 Mixed hyperlipidemia: Secondary | ICD-10-CM | POA: Diagnosis not present

## 2022-04-01 ENCOUNTER — Other Ambulatory Visit (HOSPITAL_COMMUNITY): Payer: Self-pay

## 2022-04-08 ENCOUNTER — Telehealth: Payer: Self-pay | Admitting: Rheumatology

## 2022-04-08 DIAGNOSIS — R634 Abnormal weight loss: Secondary | ICD-10-CM | POA: Diagnosis not present

## 2022-04-08 DIAGNOSIS — R5383 Other fatigue: Secondary | ICD-10-CM | POA: Diagnosis not present

## 2022-04-08 DIAGNOSIS — E782 Mixed hyperlipidemia: Secondary | ICD-10-CM | POA: Diagnosis not present

## 2022-04-08 DIAGNOSIS — I10 Essential (primary) hypertension: Secondary | ICD-10-CM | POA: Diagnosis not present

## 2022-04-08 NOTE — Telephone Encounter (Signed)
Patient states every time he takes the Darcus Pester he gets sick to his stomach and is unable for several days. Patient states after he does not get his appetite back. Patient states his PCP has advised Ensure. Patient states he has an increase in saliva as well.

## 2022-04-08 NOTE — Telephone Encounter (Signed)
Patient called the office stating he has been taking Michael Hinton is making him sick. Patient states its making him not want to eat. Patient states he has not eaten in 4-5 days. Patient states he is stopping the medication.

## 2022-04-09 ENCOUNTER — Telehealth: Payer: Self-pay | Admitting: Rheumatology

## 2022-04-09 DIAGNOSIS — R634 Abnormal weight loss: Secondary | ICD-10-CM

## 2022-04-09 DIAGNOSIS — M0579 Rheumatoid arthritis with rheumatoid factor of multiple sites without organ or systems involvement: Secondary | ICD-10-CM

## 2022-04-09 DIAGNOSIS — R630 Anorexia: Secondary | ICD-10-CM

## 2022-04-09 DIAGNOSIS — Z9225 Personal history of immunosupression therapy: Secondary | ICD-10-CM

## 2022-04-09 DIAGNOSIS — K117 Disturbances of salivary secretion: Secondary | ICD-10-CM

## 2022-04-09 NOTE — Telephone Encounter (Signed)
I returned patient's call.  I had a detailed discussion with the patient.  He stated that he is unable to eat because the food comes out when he eats.  He is also noticing that he is having more saliva coming out.  I had a detailed discussion with the patient.  I told him to stop Kevzara although I do not think his symptoms are related to [place].  I also discussed that he should be seen by a gastroenterologist.  He wants to see gastroenterologist in [place].  I advised him to get a referral from his PCP to see a gastroenterologist in [place].  Please contact his PCP and request a referral to gastroenterology for a evaluation.

## 2022-04-09 NOTE — Telephone Encounter (Signed)
Per Dr. Estanislado Pandy okay to place referral. Referral placed to GI in [place].

## 2022-04-09 NOTE — Telephone Encounter (Signed)
Patients wife called the office stating Michael Hinton's PCP thinks he shouldn't be taking the Kevzara anymore. Patients wife states he has lost 15 lbs in 2 weeks because the medication makes him so sick. She requests a call back to speak with someone about this.

## 2022-04-10 ENCOUNTER — Emergency Department: Payer: Medicare PPO

## 2022-04-10 ENCOUNTER — Inpatient Hospital Stay
Admission: EM | Admit: 2022-04-10 | Discharge: 2022-04-14 | DRG: 699 | Disposition: A | Discharge status: Home / Self Care | Payer: Medicare PPO | Attending: Internal Medicine | Admitting: Internal Medicine

## 2022-04-10 ENCOUNTER — Other Ambulatory Visit: Payer: Self-pay

## 2022-04-10 ENCOUNTER — Encounter: Payer: Self-pay | Admitting: Emergency Medicine

## 2022-04-10 DIAGNOSIS — N179 Acute kidney failure, unspecified: Secondary | ICD-10-CM | POA: Diagnosis present

## 2022-04-10 DIAGNOSIS — K828 Other specified diseases of gallbladder: Secondary | ICD-10-CM | POA: Diagnosis not present

## 2022-04-10 DIAGNOSIS — E538 Deficiency of other specified B group vitamins: Secondary | ICD-10-CM | POA: Diagnosis present

## 2022-04-10 DIAGNOSIS — E872 Acidosis, unspecified: Secondary | ICD-10-CM | POA: Diagnosis present

## 2022-04-10 DIAGNOSIS — Z79899 Other long term (current) drug therapy: Secondary | ICD-10-CM | POA: Diagnosis not present

## 2022-04-10 DIAGNOSIS — N390 Urinary tract infection, site not specified: Secondary | ICD-10-CM | POA: Diagnosis present

## 2022-04-10 DIAGNOSIS — I5032 Chronic diastolic (congestive) heart failure: Secondary | ICD-10-CM | POA: Diagnosis present

## 2022-04-10 DIAGNOSIS — R531 Weakness: Secondary | ICD-10-CM | POA: Diagnosis present

## 2022-04-10 DIAGNOSIS — Z87891 Personal history of nicotine dependence: Secondary | ICD-10-CM

## 2022-04-10 DIAGNOSIS — E860 Dehydration: Secondary | ICD-10-CM | POA: Diagnosis present

## 2022-04-10 DIAGNOSIS — N2889 Other specified disorders of kidney and ureter: Principal | ICD-10-CM | POA: Diagnosis present

## 2022-04-10 DIAGNOSIS — D649 Anemia, unspecified: Secondary | ICD-10-CM

## 2022-04-10 DIAGNOSIS — N19 Unspecified kidney failure: Secondary | ICD-10-CM | POA: Diagnosis not present

## 2022-04-10 DIAGNOSIS — D638 Anemia in other chronic diseases classified elsewhere: Secondary | ICD-10-CM | POA: Diagnosis present

## 2022-04-10 DIAGNOSIS — E875 Hyperkalemia: Secondary | ICD-10-CM | POA: Diagnosis present

## 2022-04-10 DIAGNOSIS — I10 Essential (primary) hypertension: Secondary | ICD-10-CM | POA: Diagnosis not present

## 2022-04-10 DIAGNOSIS — E785 Hyperlipidemia, unspecified: Secondary | ICD-10-CM | POA: Diagnosis present

## 2022-04-10 DIAGNOSIS — I110 Hypertensive heart disease with heart failure: Secondary | ICD-10-CM | POA: Diagnosis present

## 2022-04-10 DIAGNOSIS — D631 Anemia in chronic kidney disease: Secondary | ICD-10-CM | POA: Diagnosis not present

## 2022-04-10 DIAGNOSIS — I6389 Other cerebral infarction: Secondary | ICD-10-CM | POA: Diagnosis not present

## 2022-04-10 DIAGNOSIS — G4700 Insomnia, unspecified: Secondary | ICD-10-CM | POA: Diagnosis present

## 2022-04-10 DIAGNOSIS — Z8249 Family history of ischemic heart disease and other diseases of the circulatory system: Secondary | ICD-10-CM | POA: Diagnosis not present

## 2022-04-10 DIAGNOSIS — Z7902 Long term (current) use of antithrombotics/antiplatelets: Secondary | ICD-10-CM

## 2022-04-10 DIAGNOSIS — M0579 Rheumatoid arthritis with rheumatoid factor of multiple sites without organ or systems involvement: Secondary | ICD-10-CM | POA: Diagnosis present

## 2022-04-10 DIAGNOSIS — R1310 Dysphagia, unspecified: Secondary | ICD-10-CM | POA: Diagnosis present

## 2022-04-10 DIAGNOSIS — Z20822 Contact with and (suspected) exposure to covid-19: Secondary | ICD-10-CM | POA: Diagnosis present

## 2022-04-10 DIAGNOSIS — R634 Abnormal weight loss: Secondary | ICD-10-CM | POA: Diagnosis present

## 2022-04-10 DIAGNOSIS — M353 Polymyalgia rheumatica: Secondary | ICD-10-CM | POA: Diagnosis present

## 2022-04-10 DIAGNOSIS — R972 Elevated prostate specific antigen [PSA]: Secondary | ICD-10-CM | POA: Diagnosis present

## 2022-04-10 DIAGNOSIS — Z7952 Long term (current) use of systemic steroids: Secondary | ICD-10-CM

## 2022-04-10 DIAGNOSIS — E440 Moderate protein-calorie malnutrition: Secondary | ICD-10-CM | POA: Diagnosis present

## 2022-04-10 DIAGNOSIS — I6523 Occlusion and stenosis of bilateral carotid arteries: Secondary | ICD-10-CM | POA: Diagnosis not present

## 2022-04-10 DIAGNOSIS — C642 Malignant neoplasm of left kidney, except renal pelvis: Secondary | ICD-10-CM

## 2022-04-10 DIAGNOSIS — Z6824 Body mass index (BMI) 24.0-24.9, adult: Secondary | ICD-10-CM

## 2022-04-10 DIAGNOSIS — R059 Cough, unspecified: Secondary | ICD-10-CM | POA: Diagnosis not present

## 2022-04-10 DIAGNOSIS — N281 Cyst of kidney, acquired: Secondary | ICD-10-CM | POA: Diagnosis not present

## 2022-04-10 DIAGNOSIS — I6381 Other cerebral infarction due to occlusion or stenosis of small artery: Secondary | ICD-10-CM | POA: Diagnosis not present

## 2022-04-10 DIAGNOSIS — E559 Vitamin D deficiency, unspecified: Secondary | ICD-10-CM | POA: Diagnosis present

## 2022-04-10 DIAGNOSIS — Z8673 Personal history of transient ischemic attack (TIA), and cerebral infarction without residual deficits: Secondary | ICD-10-CM

## 2022-04-10 DIAGNOSIS — I700 Atherosclerosis of aorta: Secondary | ICD-10-CM | POA: Diagnosis not present

## 2022-04-10 DIAGNOSIS — R4182 Altered mental status, unspecified: Secondary | ICD-10-CM | POA: Diagnosis not present

## 2022-04-10 HISTORY — DX: Malignant neoplasm of left kidney, except renal pelvis: C64.2

## 2022-04-10 LAB — CBC
HCT: 35.1 % — ABNORMAL LOW (ref 39.0–52.0)
Hemoglobin: 10.9 g/dL — ABNORMAL LOW (ref 13.0–17.0)
MCH: 27.4 pg (ref 26.0–34.0)
MCHC: 31.1 g/dL (ref 30.0–36.0)
MCV: 88.2 fL (ref 80.0–100.0)
Platelets: 226 10*3/uL (ref 150–400)
RBC: 3.98 MIL/uL — ABNORMAL LOW (ref 4.22–5.81)
RDW: 13.8 % (ref 11.5–15.5)
WBC: 4 10*3/uL (ref 4.0–10.5)
nRBC: 0 % (ref 0.0–0.2)

## 2022-04-10 LAB — COMPREHENSIVE METABOLIC PANEL
ALT: 22 U/L (ref 0–44)
AST: 32 U/L (ref 15–41)
Albumin: 3.3 g/dL — ABNORMAL LOW (ref 3.5–5.0)
Alkaline Phosphatase: 94 U/L (ref 38–126)
Anion gap: 12 (ref 5–15)
BUN: 87 mg/dL — ABNORMAL HIGH (ref 8–23)
CO2: 18 mmol/L — ABNORMAL LOW (ref 22–32)
Calcium: 11.1 mg/dL — ABNORMAL HIGH (ref 8.9–10.3)
Chloride: 108 mmol/L (ref 98–111)
Creatinine, Ser: 4.72 mg/dL — ABNORMAL HIGH (ref 0.61–1.24)
GFR, Estimated: 13 mL/min — ABNORMAL LOW (ref >60)
Glucose, Bld: 95 mg/dL (ref 70–99)
Potassium: 4.5 mmol/L (ref 3.5–5.1)
Sodium: 138 mmol/L (ref 135–145)
Total Bilirubin: 1.1 mg/dL (ref 0.3–1.2)
Total Protein: 8.7 g/dL — ABNORMAL HIGH (ref 6.5–8.1)

## 2022-04-10 LAB — URINALYSIS, ROUTINE W REFLEX MICROSCOPIC
Bilirubin Urine: NEGATIVE
Glucose, UA: NEGATIVE mg/dL
Ketones, ur: NEGATIVE mg/dL
Leukocytes,Ua: NEGATIVE
Nitrite: NEGATIVE
Protein, ur: 100 mg/dL — AB
Specific Gravity, Urine: 1.015 (ref 1.005–1.030)
pH: 5 (ref 5.0–8.0)

## 2022-04-10 LAB — CREATININE, URINE, RANDOM: Creatinine, Urine: 182 mg/dL

## 2022-04-10 LAB — RESP PANEL BY RT-PCR (FLU A&B, COVID) ARPGX2
Influenza A by PCR: NEGATIVE
Influenza B by PCR: NEGATIVE
SARS Coronavirus 2 by RT PCR: NEGATIVE

## 2022-04-10 LAB — SODIUM, URINE, RANDOM: Sodium, Ur: 39 mmol/L

## 2022-04-10 LAB — CK: Total CK: 104 U/L (ref 49–397)

## 2022-04-10 LAB — PROTEIN / CREATININE RATIO, URINE
Creatinine, Urine: 176 mg/dL
Protein Creatinine Ratio: 1.12 mg/mg{Cre} — ABNORMAL HIGH (ref 0.00–0.15)
Total Protein, Urine: 197 mg/dL

## 2022-04-10 LAB — SEDIMENTATION RATE: Sed Rate: 93 mm/hr — ABNORMAL HIGH (ref 0–20)

## 2022-04-10 LAB — LACTATE DEHYDROGENASE: LDH: 173 U/L (ref 98–192)

## 2022-04-10 LAB — LACTIC ACID, PLASMA: Lactic Acid, Venous: 1 mmol/L (ref 0.5–1.9)

## 2022-04-10 MED ORDER — GLUCERNA SHAKE PO LIQD
237.0000 mL | Freq: Three times a day (TID) | ORAL | Status: DC
  Administered 2022-04-10 – 2022-04-13 (×2): 237 mL via ORAL

## 2022-04-10 MED ORDER — SODIUM CHLORIDE 0.9 % IV SOLN
1.0000 g | INTRAVENOUS | Status: DC
  Administered 2022-04-10 – 2022-04-11 (×2): 1 g via INTRAVENOUS
  Filled 2022-04-10 (×3): qty 10

## 2022-04-10 MED ORDER — LACTATED RINGERS IV SOLN: INTRAVENOUS | Status: AC

## 2022-04-10 MED ORDER — TRAMADOL HCL 50 MG PO TABS
25.0000 mg | ORAL_TABLET | Freq: Once | ORAL | Status: AC
  Administered 2022-04-10: 25 mg via ORAL
  Filled 2022-04-10: qty 1

## 2022-04-10 MED ORDER — PREDNISONE 20 MG PO TABS
20.0000 mg | ORAL_TABLET | Freq: Every day | ORAL | Status: DC
  Administered 2022-04-10 – 2022-04-14 (×4): 20 mg via ORAL
  Filled 2022-04-10 (×4): qty 1

## 2022-04-10 MED ORDER — SODIUM CHLORIDE 0.9 % IV BOLUS: 500.0000 mL | Freq: Once | INTRAVENOUS | Status: DC

## 2022-04-10 MED ORDER — AMLODIPINE BESYLATE 5 MG PO TABS
10.0000 mg | ORAL_TABLET | Freq: Every morning | ORAL | Status: DC
  Administered 2022-04-11 – 2022-04-14 (×4): 10 mg via ORAL
  Filled 2022-04-10 (×4): qty 2

## 2022-04-10 MED ORDER — PREDNISONE 10 MG PO TABS: 5.0000 mg | ORAL_TABLET | Freq: Every day | ORAL | Status: DC

## 2022-04-10 MED ORDER — ACETAMINOPHEN 325 MG PO TABS: 650.0000 mg | ORAL_TABLET | Freq: Four times a day (QID) | ORAL | Status: DC | PRN

## 2022-04-10 MED ORDER — ONDANSETRON HCL 4 MG/2ML IJ SOLN: 4.0000 mg | Freq: Four times a day (QID) | INTRAMUSCULAR | Status: DC | PRN

## 2022-04-10 MED ORDER — ACETAMINOPHEN 650 MG RE SUPP: 650.0000 mg | Freq: Four times a day (QID) | RECTAL | Status: DC | PRN

## 2022-04-10 MED ORDER — ONDANSETRON HCL 4 MG PO TABS: 4.0000 mg | ORAL_TABLET | Freq: Four times a day (QID) | ORAL | Status: DC | PRN

## 2022-04-10 MED ORDER — HEPARIN SODIUM (PORCINE) 5000 UNIT/ML IJ SOLN
5000.0000 [IU] | Freq: Three times a day (TID) | INTRAMUSCULAR | Status: DC
  Administered 2022-04-10 – 2022-04-14 (×12): 5000 [IU] via SUBCUTANEOUS
  Filled 2022-04-10 (×12): qty 1

## 2022-04-10 MED ORDER — SENNOSIDES-DOCUSATE SODIUM 8.6-50 MG PO TABS: 1.0000 | ORAL_TABLET | Freq: Every evening | ORAL | Status: DC | PRN

## 2022-04-10 MED ORDER — TRAZODONE HCL 50 MG PO TABS: 50.0000 mg | ORAL_TABLET | Freq: Every evening | ORAL | Status: DC | PRN

## 2022-04-10 MED ORDER — CLOPIDOGREL BISULFATE 75 MG PO TABS: 75.0000 mg | ORAL_TABLET | Freq: Every day | ORAL | Status: DC

## 2022-04-10 NOTE — ED Provider Notes (Signed)
[facility] Provider Note  Patient Contact: 3:45 PM (approximate)   History   Weakness   HPI  Michael Hinton. is a 71 y.o. male with a history of hypertension, rheumatoid arthritis and anemia, presents to the emergency department with weakness for the past 2 to 3 weeks.  Patient states that he has not felt like eating as after he eats he experiences some nausea.  Patient's wife reports that she has been with her ailing mother for the past 2 to 3 weeks and when she came home, she was surprised at patient's weight loss as he has lost approximately 15 pounds unintentionally.  Patient denies flank pain or dysuria.  No fever or chills.  Patient's wife reports that she has recently purchased applesauce and Jell-O as well as bone broth but she states that patient has been refusing to eat.  No chest pain, chest tightness or abdominal pain.  No personal history of malignancy or chronic kidney disease.  Patient recently was transitioned to [facility] for his RA and is also taking prednisone but has had no other medication changes.      Physical Exam   Triage Vital Signs: ED Triage Vitals  Enc Vitals Group     BP 04/10/22 1242 105/86     Pulse Rate 04/10/22 1242 77     Resp 04/10/22 1242 18     Temp 04/10/22 1242 97.6 F (36.4 C)     Temp Source 04/10/22 1242 Oral     SpO2 04/10/22 1242 100 %     Weight 04/10/22 1224 175 lb (79.4 kg)     Height 04/10/22 1224 '5\' 11"'$  (1.803 m)     Head Circumference --      Peak Flow --      Pain Score 04/10/22 1224 0     Pain Loc --      Pain Edu? --      Excl. in [place]? --     Most recent vital signs: Vitals:   04/10/22 1242  BP: 105/86  Pulse: 77  Resp: 18  Temp: 97.6 F (36.4 C)  SpO2: 100%     General: Patient seems subdued Eyes:  PERRL. EOMI. Head: No acute traumatic findings ENT:      Nose: No congestion/rhinnorhea.      Mouth/Throat: Mucous membranes are moist. Neck: No stridor. No cervical spine tenderness  to palpation. Cardiovascular:  Good peripheral perfusion Respiratory: Normal respiratory effort without tachypnea or retractions. Lungs CTAB. Good air entry to the bases with no decreased or absent breath sounds. Gastrointestinal: Bowel sounds 4 quadrants. Soft and nontender to palpation. No guarding or rigidity. No palpable masses. No distention. No CVA tenderness. Musculoskeletal: Full range of motion to all extremities.  Neurologic:  No gross focal neurologic deficits are appreciated.  Skin:   No rash noted Other:   ED Results / Procedures / Treatments   Labs (all labs ordered are listed, but only abnormal results are displayed) Labs Reviewed  CBC - Abnormal; Notable for the following components:      Result Value   RBC 3.98 (*)    Hemoglobin 10.9 (*)    HCT 35.1 (*)    All other components within normal limits  URINALYSIS, ROUTINE W REFLEX MICROSCOPIC - Abnormal; Notable for the following components:   Color, Urine YELLOW (*)    APPearance TURBID (*)    Hgb urine dipstick MODERATE (*)    Protein, ur 100 (*)    Bacteria, UA  MANY (*)    All other components within normal limits  COMPREHENSIVE METABOLIC PANEL - Abnormal; Notable for the following components:   CO2 18 (*)    BUN 87 (*)    Creatinine, Ser 4.72 (*)    Calcium 11.1 (*)    Total Protein 8.7 (*)    Albumin 3.3 (*)    GFR, Estimated 13 (*)    All other components within normal limits  RESP PANEL BY RT-PCR (FLU A&B, COVID) ARPGX2  LACTATE DEHYDROGENASE  CBC  BASIC METABOLIC PANEL  CBG MONITORING, ED     EKG  Normal sinus rhythm with widened T waves.  No ST segment elevation.   RADIOLOGY  I personally viewed and evaluated these images as part of my medical decision making, as well as reviewing the written report by the radiologist.  ED Provider Interpretation: No evidence of intracranial bleed.  No consolidations, opacities or infiltrates to suggest pneumonia on chest x-ray.   PROCEDURES:  Critical  Care performed: No  Procedures   MEDICATIONS ORDERED IN ED: Medications  sodium chloride 0.9 % bolus 500 mL (has no administration in time range)  heparin injection 5,000 Units (has no administration in time range)  acetaminophen (TYLENOL) tablet 650 mg (has no administration in time range)    Or  acetaminophen (TYLENOL) suppository 650 mg (has no administration in time range)  traZODone (DESYREL) tablet 50 mg (has no administration in time range)  senna-docusate (Senokot-S) tablet 1 tablet (has no administration in time range)  ondansetron (ZOFRAN) tablet 4 mg (has no administration in time range)    Or  ondansetron (ZOFRAN) injection 4 mg (has no administration in time range)     IMPRESSION / MDM / ASSESSMENT AND PLAN / ED COURSE  I reviewed the triage vital signs and the nursing notes.                              Assessment and plan AKI 71 year old male with history of hypertension, anemia and rheumatoid arthritis, presents to the emergency department with weakness and unintentional weight loss for the past 3 weeks.  Vital signs were reassuring at triage.  On exam, patient seems subdued and generally lethargic.  Patient's wife helps provide historical details.  Patient had new AKI indicated on CMP with creatinine at 4.72 with GFR less than 13.  Patient's last creatinine 1 month ago was 1.13.  Patient also had notably elevated calcium at 11.1 with hemoglobin of 10.9.  Prior hemoglobin 4 months ago was 11.6.  I reached out to nephrologist on-call, Dr. Zollie Scale.  Very much appreciate time and consult.  We will plan to order renal ultrasound and admit to the hospitalist service.  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  Radiologist recommended MRI abdomen and pelvis.   I reached out to oncologist on-call, Dr. Tasia Catchings with renal ultrasound results.  She recommended adding on LDH and anticipated being consulted once patient is  admitted.  Patient was accepted to the hospitalist service.   FINAL CLINICAL IMPRESSION(S) / ED DIAGNOSES   Final diagnoses:  Weakness  Renal mass  AKI (acute kidney injury) ([place])     Rx / DC Orders   ED Discharge Orders     None        Note:  This document was prepared using Dragon voice recognition software and may include unintentional dictation errors.   Vallarie Mare [place], [place] 04/10/22  [id number]    Lucillie Garfinkel, MD 04/10/22 [phone]

## 2022-04-10 NOTE — Progress Notes (Signed)
VS with yellow MEWS upon arrival to floor.  Telemetry orders obtained.  Foley inserted per MD orders.  Pt reports only pain in his right shoulder when he moves.  He is cold and has several blankets on him and wife turned up the temperature in the room.  Pt is alert and oriented x 4, needs assist with turning.    04/10/22 2014  Assess: MEWS Score  Temp 98.3 F (36.8 C)  BP 130/79  MAP (mmHg) 93  Pulse Rate (!) 137  Resp 20  Level of Consciousness Alert  SpO2 100 %  O2 Device Room Air  Assess: MEWS Score  MEWS Temp 0  MEWS Systolic 0  MEWS Pulse 3  MEWS RR 0  MEWS LOC 0  MEWS Score 3  MEWS Score Color Yellow  Assess: if the MEWS score is Yellow or Red  Were vital signs taken at a resting state? Yes  Focused Assessment No change from prior assessment  Does the patient meet 2 or more of the SIRS criteria? Yes  Does the patient have a confirmed or suspected source of infection? No  MEWS guidelines implemented *See Row Information* Yes  Treat  MEWS Interventions Escalated (See documentation below)  Pain Scale 0-10  Pain Score 0  Take Vital Signs  Increase Vital Sign Frequency  Yellow: Q 2hr X 2 then Q 4hr X 2, if remains yellow, continue Q 4hrs  Escalate  MEWS: Escalate Yellow: discuss with charge nurse/RN and consider discussing with provider and RRT  Notify: Charge Nurse/RN  Name of Charge Nurse/RN Notified Cherlynn Perches RN  Date Charge Nurse/RN Notified 04/10/22  Time Charge Nurse/RN Notified 2030  Notify: Provider  Provider Name/Title George Hugh  Date Provider Notified 04/10/22  Time Provider Notified 2030  Method of Notification  (secure chat)  Notification Reason Other (Comment) (HR 130's)  Provider response See new orders  Date of Provider Response 04/10/22  Time of Provider Response 2030  Assess: SIRS CRITERIA  SIRS Temperature  0  SIRS Pulse 1  SIRS Respirations  0  SIRS WBC 1  SIRS Score Sum  2    Phelps Dodge BSN RN [facility] 04/10/2022, 9:07 PM

## 2022-04-10 NOTE — ED Notes (Signed)
Patient's glasses and dentures are going to floor with patient. Patient's family has rest of the belongings.

## 2022-04-10 NOTE — ED Notes (Signed)
US at bedside

## 2022-04-10 NOTE — H&P (Addendum)
History and Physical    Michael Hinton. IWL:[id number] DOB: 1950/10/07 DOA: 04/10/2022  PCP: Danelle Berry, NP Consultants:  Rheumatology Dr. Estanislado Pandy Patient coming from: Home - lives with wife.  Chief Complaint: Poor Oral Intake, Fatigue, Weakness   HPI: Michael Hinton. is a 72 y.o. 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  He has been on Sarilumab and Prednisone for the last few months.  He says the Prednisone helped his pain a little bit but he stopped taking it recently because he felt so bad.  [name] denies any hematuria.  He endorses mild right sided flank pain.  There is no palpable mass on exam.  He denies any chest pain, shortness of breath, nausea, vomiting, abdominal pain, diarrhea, or urinary symptoms.  His creatinine has increased from 1.14 to 4.72 mg/dL with GFR of 13 mL/min over approximately one month.  Calcium level is 11.1 mg/dL.  Hemoglobin/Hematocrit is stable at 10.9/35.1.  Renal ultrasound shows a "5.2 cm heterogeneous solid mass" that is concerning for renal cell carcinoma.    ED Course:  NS Bolus 500 Ccs x once  Review of Systems: As per HPI; otherwise review of systems reviewed and negative.   Ambulatory Status: Ambulates without assistance   Past Medical History:  Diagnosis Date   Anemia    High risk medication use 06/02/2016   Hypertension 06/02/2016   Neutropenia (HCC)    Prediabetes    Rheumatoid arthritis involving multiple sites with positive rheumatoid factor ([place]) 06/02/2016    History reviewed. No pertinent surgical history.  Social History   Socioeconomic History   Marital status: Married    Spouse name: Not on file   Number of  children: Not on file   Years of education: Not on file   Highest education level: Not on file  Occupational History   Not on file  Tobacco Use   Smoking status: Former    Packs/day: 0.50    Years: 7.00    Total pack years: 3.50    Types: Cigarettes    Quit date: 03/15/1978    Years since quitting: 44.1    Passive exposure: Past   Smokeless tobacco: Never  Vaping Use   Vaping Use: Never used  Substance and Sexual Activity   Alcohol use: Not Currently   Drug use: No   Sexual activity: Not on file  Other Topics Concern   Not on file  Social History Narrative   Not on file   Social Determinants of Health   Financial Resource Strain: Not on file  Food Insecurity: Not on file  Transportation Needs: Not on file  Physical Activity: Not on file  Stress: Not on file  Social Connections: Not on file  Intimate Partner Violence: Not on file    No Known Allergies  Family History  Problem Relation Age of Onset   Heart disease Mother    Diabetes Mother    Breast cancer Mother    Kidney disease Father    Diabetes Father    Diabetes Daughter     Prior to Admission medications   Medication Sig Start Date End Date Taking? Authorizing Provider  amLODipine (NORVASC) 10 MG tablet Take 10 mg by mouth every morning. Patient  not taking: Reported on 02/17/2022 04/18/21   [provider]  atorvastatin (LIPITOR) 20 MG tablet  06/18/19   [provider]  Cholecalciferol (VITAMIN D-3) 125 MCG (5000 UT) TABS Take 1 tablet by mouth daily.    [provider]  Ferrous Sulfate (IRON PO) Take by mouth daily.    [provider]  losartan (COZAAR) 100 MG tablet  10/31/19   [provider]  predniSONE (DELTASONE) 5 MG tablet Take 1 tablet (5 mg total) by mouth daily with breakfast. 02/17/22   Ofilia Neas, PA-C  Sarilumab [facility]) 200 MG/1.14ML SOAJ Inject 200 mg into the skin every 21 ( twenty-one) days. Patient not taking: Reported on 02/17/2022 12/11/21    Bo Merino, MD  Sarilumab [facility]) 200 MG/1.14ML SOAJ Inject 200 mg into the skin every 21 ( twenty-one) days. 01/01/22   Ofilia Neas, PA-C    Physical Exam: Vitals:   04/10/22 1224 04/10/22 1242  BP:  105/86  Pulse:  77  Resp:  18  Temp:  97.6 F (36.4 C)  TempSrc:  Oral  SpO2:  100%  Weight: 79.4 kg   Height: '5\' 11"'$  (1.803 m)     Examination: General exam: appears fatigued, weak, and malaised.  Complains of severe right shoulder pain. HEENT: NCAT, PERRL Respiratory system: CTAB no WRR Cardiovascular system: Did not appreciate a murmur, regular, Mild JVD. Gastrointestinal system: No flank pain, Abdomen soft, NT,ND, BS+. Nervous System: No focal deficits. Extremities: Limited ROM or right shoulder.    Skin: No varicoceles.  No skin lesions. MSK: Physical Deconditioning   Radiological Exams on Admission: Independently reviewed - see discussion in A/P where applicable  US Renal  Result Date: 04/10/2022 CLINICAL DATA:  Renal failure. EXAM: RENAL / URINARY TRACT ULTRASOUND COMPLETE COMPARISON:  None Available. FINDINGS: Right Kidney: Renal measurements: 11.2 x 5.4 x 5.4 cm = volume: 172 mL. 2.2 cm simple cyst is noted in lower pole. Increased echogenicity of renal parenchyma is noted suggesting medical renal disease. No mass or hydronephrosis visualized. Left Kidney: Renal measurements: 12.9 x 7.0 x 6.0 cm = volume: 284 mL. Increased echogenicity of renal parenchyma is noted suggesting medical renal disease. 5.2 cm heterogeneous solid mass is noted medially concerning for renal cell carcinoma. 1.5 cm simple cyst is noted in lower pole. No hydronephrosis is noted. Bladder: Appears normal for degree of bladder distention. Other: None. IMPRESSION: 5.2 cm heterogeneous solid mass seen in left kidney concerning for renal cell carcinoma. Further evaluation with MRI or CT scan is recommended. Increased echogenicity of renal parenchyma is noted bilaterally suggesting medical renal  disease. Electronically Signed   By: Marijo Conception M.D.   On: 04/10/2022 16:56   CT HEAD WO CONTRAST (5MM)  Result Date: 04/10/2022 CLINICAL DATA:  Altered mental change EXAM: CT HEAD WITHOUT CONTRAST TECHNIQUE: Contiguous axial images were obtained from the base of the skull through the vertex without intravenous contrast. RADIATION DOSE REDUCTION: This exam was performed according to the departmental dose-optimization program which includes automated exposure control, adjustment of the mA and/or kV according to patient size and/or use of iterative reconstruction technique. COMPARISON:  MR brain done on 01/14/2022 FINDINGS: Brain: No acute intracranial findings are seen. There are no signs of bleeding within the cranium. There are possible old lacunar infarcts in the cerebellum on both sides. Cortical sulci are prominent. There is no focal edema or mass effect. Vascular: Scattered arterial calcifications are seen. Skull: Unremarkable. Sinuses/Orbits: Unremarkable. Other: None. IMPRESSION: No acute intracranial  findings are seen in noncontrast CT brain. Atrophy. 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 Electronically Signed   By: Elmer Picker M.D.   On: 04/10/2022 14:04   DG Chest 2 View  Result Date: 04/10/2022 CLINICAL DATA:  Weakness and cough EXAM: CHEST - 2 VIEW COMPARISON:  None Available. FINDINGS: Normal mediastinum and cardiac silhouette. Normal pulmonary vasculature. No evidence of effusion, infiltrate, or pneumothorax. No acute bony abnormality. Degenerative osteophytosis of the spine. IMPRESSION: No acute cardiopulmonary process. Electronically Signed   By: Suzy Bouchard M.D.   On: 04/10/2022 13:43    EKG: Independently reviewed.  NSR with rate 66 beats/min.  No acute ST or T wave changes.  QTC is 385 ms.  Labs on Admission: I have personally reviewed the available labs and imaging studies at the time of the admission.  Pertinent labs:   Lab Results   Component Value Date   WBC 4.0 04/10/2022   HGB 10.9 (L) 04/10/2022   HCT 35.1 (L) 04/10/2022   MCV 88.2 04/10/2022   PLT 226 27/25/3664   Last metabolic panel Lab Results  Component Value Date   GLUCOSE 95 04/10/2022   NA 138 04/10/2022   K 4.5 04/10/2022   CL 108 04/10/2022   CO2 18 (L) 04/10/2022   BUN 87 (H) 04/10/2022   CREATININE 4.72 (H) 04/10/2022   GFRNONAA 13 (L) 04/10/2022   CALCIUM 11.1 (H) 04/10/2022   PROT 8.7 (H) 04/10/2022   ALBUMIN 3.3 (L) 04/10/2022   LABGLOB 4.5 (H) 10/07/2021   BILITOT 1.1 04/10/2022   ALKPHOS 94 04/10/2022   AST 32 04/10/2022   ALT 22 04/10/2022   ANIONGAP 12 04/10/2022    Assessment/Plan  Acute Kidney Injury, multi-factorial- this is secondary to poor oral intake/dehydration, component of intra-renal disease, and post-renal obstruction.  12/09/21  02/17/22  03/06/22  04/10/22   Creatinine 0.99 1.22 1.14 4.72 (H)  - Creatinine has increased from 1.14 to 4.72 mg/dL over approximately 33 days.  BC is 18 mmol/L.  GFR is 13 mL/min.  Calcium is 11.1 mg/dL. - Check urine studies. - Start Lactated Ringer 100 CC/hr x 10 hours and repeat BMP in am.  Bladder appears full on Renal Ultrasound.  Place Foley and monitor strict I/Os. - Consult Nephrology given significant renal injury, appreciate assistance and recommendations. - Hold Losartan, avoid NSAIDs and nephrotoxic medications.  5.2 cm Left Renal Mass: Ultrasound reports "5.2 cm heterogeneous solid mass seen in left kidney concerning for renal cell carcinoma." - Check MRI of the kidney. - Consult Oncology and Urology to evaluate renal mass, appreciate assistance and recommendations.  Patient will likely need biopsy to provide tumor pathology- hold Plavix.  Nephrectomy can be completed by Urology outpatient. - Check LDH for now.  Hypercalcemia in the setting of Renal Mass: Calcium is 11.1 mg/dL. - Continue fluids as above.  Repeat BMP in am. - Check PTH, PTHrP, and 1,25 Vit D.  CXR shows  no pleural effusions or other abnormality.  Chills: UA shows no leukocytes or nitrites and 11-20 WBCs. - Check Blood and Urine Cultures. - If he has any fevers or looks worse, we will start antibiotics.  Essential Hypertension: BP is stable. - Continue home medications Amlodipine 10 mg daily.  Hold Losartan given AKI.  Hyperlipidemia:  - Continue Statin if CK is wnl.  History of Multiple Small Cerebellar Infarcts: 02/21/22 Cardiology note reports a ordering an Echo and Holter Monitor.  Patient admits he did not have this  completed. - Hold Plavix for now as patient may be able to have biopsy completed while inpatient. - Monitor on telemetry for atrial fibrillation. - Check Echo to complete stroke workup.  Rheumatoid Arthritis: Patient is currently on Sarilumab (IL-6i) and Prednisone 5 mg daily.  Per [employer] in terms of renal function "There are no dosage adjustments provided in the manufacturer's labeling (has not been studied)." Sarilumab also does not define any clear relationship or impact on the development or course of malignancy. - Patient endorses severe right arm pain from his polymyalgia rheumatica.  Increase Prednisone to 20 mg daily.  This can be tapered once symptoms improve.  Give Tramadol 25 mg x once to see if this helps relieve pain.  Avoid any other pain medications for now given poor creatinine clearance. - Check LFTs and am cortisol. - Follow up with Rheumatology outpatient.  Poor Oral Intake / Insomnia: - Hold Mirtazapine for now as drug clearance is decreased in renal impairment. - Start Milkshake supplements TID.  Body mass index is 24.41 kg/m.   Level of care:  DVT prophylaxis: Heparin (due to renal function) Code Status: Full Family Communication: None present; I spoke with the patient at bedside. Disposition Plan:  The patient is from: home  Anticipated d/c is to: home without [facility] services   Anticipated d/c date will depend on clinical response to treatment,  but we estimate <48 hours.  Patient is currently: acutely ill Consults called: Urology Admission status:  Observation   George Hugh MD [facility]   How to contact the [facility] Attending or Consulting provider [place] or covering provider during after hours [place], for this patient?  Check the care team in [facility] and look for a) attending/consulting [facility] provider listed and b) the [facility] team listed Log into [email or web address] and use 's universal password to access. If you do not have the password, please contact the hospital operator. Locate the [facility] provider you are looking for under [facility] and page to a number that you can be directly reached. If you still have difficulty reaching the provider, please page the [facility] (Director on Call) for the Hospitalists listed on amion for assistance.   04/10/2022, 5:41 PM

## 2022-04-10 NOTE — ED Triage Notes (Signed)
Patient to ED via POV for weakness over the past few weeks. Patient states he has been unable to eat due to lack of appetite. Patient states he has lost 15 pounds over the last 2 weeks.

## 2022-04-10 NOTE — Progress Notes (Signed)
Arrived to ED, pt off the floor. Family reports in MRI. Arrived to MRI, Rad tech reports scan is being done without contrast. RN to re-enter consult when pt is available.

## 2022-04-10 NOTE — ED Provider Triage Note (Signed)
Emergency Medicine Provider Triage Evaluation Note  Michael Hinton. , a 71 y.o. male  was evaluated in triage.  Pt complains of generalized weakness for the last few weeks.  Patient reports lack of appetite with an unknown plan 15 pound weight loss over the last 2 weeks.  He is also on blood thinners for carotid artery atheroscleroses and MRI history of old strokes. He is present with his wife, who has been away from the patient to care for her ailing mother.  She noted a stark change in his body habitus when she saw him 2 days ago. She also voices concern over some increased confusion.  Patient with a history of hypertension, RA, neutropenia, and reports missed PCP over recent drop in his H&H, presents to the ED for generalized weakness.  Review of Systems  Positive: Weakness, anemia, wt loss Negative: FCS  Physical Exam  BP 105/86 (BP Location: Right Arm)   Pulse 77   Temp 97.6 F (36.4 C) (Oral)   Resp 18   Ht '5\' 11"'$  (1.803 m)   Wt 79.4 kg   SpO2 100%   BMI 24.41 kg/m  Gen:   Awake, no distress  NAD Resp:  Normal effort CTA MSK:   Moves extremities without difficulty R knee with RA changes CVS:  RRR  Medical Decision Making  Medically screening exam initiated at 12:46 PM.  Appropriate orders placed.  Michael Hinton. was informed that the remainder of the evaluation will be completed by another provider, this initial triage assessment does not replace that evaluation, and the importance of remaining in the ED until their evaluation is complete.  Patient to the ED for evaluation generalized weakness for the last 2 weeks as well as poor intake and an unexplained 15 pound weight loss over the last 2 weeks.  His wife voices some concern for  increased confusion.   Melvenia Needles, PA-C 04/10/22 1254

## 2022-04-10 NOTE — ED Notes (Signed)
Pt's wife reports that he is very weak, he has lost 15 lbs in the past 2 weeks, he has no appetite, his rheumatoid arthritis is acting up and they had to take him off his medicine due to his anemia and placed him on prednisone. Pt had blood work done 03/31/22 and pt's gfr and bun/creatinine have changed in this amount of time  Pt denies nausea, vomiting, or diarrhea. Pt does state that he is voiding a bit smaller than usual, pt has no peripheral edema noted.

## 2022-04-11 ENCOUNTER — Encounter: Payer: Self-pay | Admitting: Internal Medicine

## 2022-04-11 ENCOUNTER — Observation Stay
Admit: 2022-04-11 | Discharge: 2022-04-11 | Disposition: A | Discharge status: Home / Self Care | Payer: Medicare PPO | Attending: Internal Medicine | Admitting: Internal Medicine

## 2022-04-11 ENCOUNTER — Inpatient Hospital Stay: Payer: Medicare PPO

## 2022-04-11 DIAGNOSIS — Z79899 Other long term (current) drug therapy: Secondary | ICD-10-CM | POA: Diagnosis not present

## 2022-04-11 DIAGNOSIS — R1310 Dysphagia, unspecified: Secondary | ICD-10-CM | POA: Diagnosis present

## 2022-04-11 DIAGNOSIS — R972 Elevated prostate specific antigen [PSA]: Secondary | ICD-10-CM

## 2022-04-11 DIAGNOSIS — N390 Urinary tract infection, site not specified: Secondary | ICD-10-CM | POA: Diagnosis present

## 2022-04-11 DIAGNOSIS — N179 Acute kidney failure, unspecified: Secondary | ICD-10-CM

## 2022-04-11 DIAGNOSIS — D649 Anemia, unspecified: Secondary | ICD-10-CM | POA: Diagnosis not present

## 2022-04-11 DIAGNOSIS — R531 Weakness: Secondary | ICD-10-CM | POA: Diagnosis present

## 2022-04-11 DIAGNOSIS — I5032 Chronic diastolic (congestive) heart failure: Secondary | ICD-10-CM | POA: Diagnosis present

## 2022-04-11 DIAGNOSIS — E440 Moderate protein-calorie malnutrition: Secondary | ICD-10-CM | POA: Diagnosis present

## 2022-04-11 DIAGNOSIS — E875 Hyperkalemia: Secondary | ICD-10-CM | POA: Diagnosis present

## 2022-04-11 DIAGNOSIS — E559 Vitamin D deficiency, unspecified: Secondary | ICD-10-CM | POA: Diagnosis present

## 2022-04-11 DIAGNOSIS — E872 Acidosis, unspecified: Secondary | ICD-10-CM | POA: Diagnosis present

## 2022-04-11 DIAGNOSIS — E538 Deficiency of other specified B group vitamins: Secondary | ICD-10-CM | POA: Diagnosis present

## 2022-04-11 DIAGNOSIS — M0579 Rheumatoid arthritis with rheumatoid factor of multiple sites without organ or systems involvement: Secondary | ICD-10-CM | POA: Diagnosis present

## 2022-04-11 DIAGNOSIS — Z8249 Family history of ischemic heart disease and other diseases of the circulatory system: Secondary | ICD-10-CM | POA: Diagnosis not present

## 2022-04-11 DIAGNOSIS — I779 Disorder of arteries and arterioles, unspecified: Secondary | ICD-10-CM

## 2022-04-11 DIAGNOSIS — R634 Abnormal weight loss: Secondary | ICD-10-CM | POA: Diagnosis present

## 2022-04-11 DIAGNOSIS — G4700 Insomnia, unspecified: Secondary | ICD-10-CM | POA: Diagnosis present

## 2022-04-11 DIAGNOSIS — E860 Dehydration: Secondary | ICD-10-CM | POA: Diagnosis present

## 2022-04-11 DIAGNOSIS — Z20822 Contact with and (suspected) exposure to covid-19: Secondary | ICD-10-CM | POA: Diagnosis present

## 2022-04-11 DIAGNOSIS — I5189 Other ill-defined heart diseases: Secondary | ICD-10-CM

## 2022-04-11 DIAGNOSIS — N2889 Other specified disorders of kidney and ureter: Secondary | ICD-10-CM | POA: Diagnosis present

## 2022-04-11 DIAGNOSIS — E785 Hyperlipidemia, unspecified: Secondary | ICD-10-CM | POA: Diagnosis present

## 2022-04-11 DIAGNOSIS — M353 Polymyalgia rheumatica: Secondary | ICD-10-CM | POA: Diagnosis present

## 2022-04-11 DIAGNOSIS — Z7902 Long term (current) use of antithrombotics/antiplatelets: Secondary | ICD-10-CM | POA: Diagnosis not present

## 2022-04-11 DIAGNOSIS — I110 Hypertensive heart disease with heart failure: Secondary | ICD-10-CM | POA: Diagnosis present

## 2022-04-11 DIAGNOSIS — D638 Anemia in other chronic diseases classified elsewhere: Secondary | ICD-10-CM | POA: Diagnosis present

## 2022-04-11 HISTORY — DX: Disorder of arteries and arterioles, unspecified: I77.9

## 2022-04-11 HISTORY — DX: Other ill-defined heart diseases: I51.89

## 2022-04-11 LAB — BASIC METABOLIC PANEL
Anion gap: 6 (ref 5–15)
Anion gap: 14 (ref 5–15)
BUN: 84 mg/dL — ABNORMAL HIGH (ref 8–23)
BUN: 85 mg/dL — ABNORMAL HIGH (ref 8–23)
CO2: 19 mmol/L — ABNORMAL LOW (ref 22–32)
CO2: 15 mmol/L — ABNORMAL LOW (ref 22–32)
Calcium: 10 mg/dL (ref 8.9–10.3)
Calcium: 10 mg/dL (ref 8.9–10.3)
Chloride: 113 mmol/L — ABNORMAL HIGH (ref 98–111)
Chloride: 108 mmol/L (ref 98–111)
Creatinine, Ser: 4.71 mg/dL — ABNORMAL HIGH (ref 0.61–1.24)
Creatinine, Ser: 4.68 mg/dL — ABNORMAL HIGH (ref 0.61–1.24)
GFR, Estimated: 13 mL/min — ABNORMAL LOW (ref >60)
GFR, Estimated: 13 mL/min — ABNORMAL LOW (ref >60)
Glucose, Bld: 106 mg/dL — ABNORMAL HIGH (ref 70–99)
Glucose, Bld: 93 mg/dL (ref 70–99)
Potassium: 5.2 mmol/L — ABNORMAL HIGH (ref 3.5–5.1)
Potassium: 4.6 mmol/L (ref 3.5–5.1)
Sodium: 138 mmol/L (ref 135–145)
Sodium: 137 mmol/L (ref 135–145)

## 2022-04-11 LAB — CBC
HCT: 28.8 % — ABNORMAL LOW (ref 39.0–52.0)
Hemoglobin: 9 g/dL — ABNORMAL LOW (ref 13.0–17.0)
MCH: 27.2 pg (ref 26.0–34.0)
MCHC: 31.3 g/dL (ref 30.0–36.0)
MCV: 87 fL (ref 80.0–100.0)
Platelets: 182 10*3/uL (ref 150–400)
RBC: 3.31 MIL/uL — ABNORMAL LOW (ref 4.22–5.81)
RDW: 13.9 % (ref 11.5–15.5)
WBC: 4.4 10*3/uL (ref 4.0–10.5)
nRBC: 0 % (ref 0.0–0.2)

## 2022-04-11 LAB — C-REACTIVE PROTEIN: CRP: 8.6 mg/dL — ABNORMAL HIGH (ref <1.0)

## 2022-04-11 LAB — ECHOCARDIOGRAM COMPLETE
Area-P 1/2: 2.03 cm2
Height: 71 in
S' Lateral: 3.43 cm
Weight: 2800 oz

## 2022-04-11 LAB — LACTIC ACID, PLASMA: Lactic Acid, Venous: 0.9 mmol/L (ref 0.5–1.9)

## 2022-04-11 LAB — CORTISOL-AM, BLOOD: Cortisol - AM: 15.2 ug/dL (ref 6.7–22.6)

## 2022-04-11 MED ORDER — ATORVASTATIN CALCIUM 20 MG PO TABS
20.0000 mg | ORAL_TABLET | Freq: Every day | ORAL | Status: DC
  Administered 2022-04-11 – 2022-04-14 (×4): 20 mg via ORAL
  Filled 2022-04-11 (×4): qty 1

## 2022-04-11 MED ORDER — PERFLUTREN LIPID MICROSPHERE
1.0000 mL | INTRAVENOUS | Status: AC | PRN
  Administered 2022-04-11: 5 mL via INTRAVENOUS

## 2022-04-11 MED ORDER — MIRTAZAPINE 15 MG PO TABS
7.5000 mg | ORAL_TABLET | Freq: Every day | ORAL | Status: DC
  Administered 2022-04-11 – 2022-04-13 (×3): 7.5 mg via ORAL
  Filled 2022-04-11 (×3): qty 1

## 2022-04-11 MED ORDER — CLOPIDOGREL BISULFATE 75 MG PO TABS
75.0000 mg | ORAL_TABLET | Freq: Every day | ORAL | Status: DC
  Administered 2022-04-11 – 2022-04-14 (×4): 75 mg via ORAL
  Filled 2022-04-11 (×4): qty 1

## 2022-04-11 MED ORDER — LACTATED RINGERS IV SOLN: INTRAVENOUS | Status: DC

## 2022-04-11 NOTE — Consult Note (Signed)
[facility]  CONSULT NOTE    Date: 04/11/2022                  Patient Name:  Michael Hinton.  MRN: [id number]  DOB: Dec 21, 1950  Age / Sex: 71 y.o., male         PCP: Danelle Berry, NP                 Service Requesting Consult: [place]                 Reason for Consult: AKI             History of Present Illness: [name]. is a 71 y.o.  male with , who was admitted to [facility] on 04/10/2022 for evaluation of AKI with a history of anemia, HTN, RA.   Patient's has had 15 pounds of weight loss over the past two weeks. His wife has been caring for her mother and found that he had lost a signficant amount of weight  throughout this time frame and is refusing to eat   The patient reports he does not have much of an appetite and will eat maybe 1 meal a day.    Medications: Outpatient medications: Medications Prior to Admission  Medication Sig Dispense Refill Last Dose   amLODipine (NORVASC) 10 MG tablet Take 10 mg by mouth every morning.      atorvastatin (LIPITOR) 20 MG tablet       atorvastatin (LIPITOR) 40 MG tablet Take 40 mg by mouth daily.      Cholecalciferol (VITAMIN D-3) 125 MCG (5000 UT) TABS Take 1 tablet by mouth daily.      clopidogrel (PLAVIX) 75 MG tablet Take 75 mg by mouth daily.      Ferrous Sulfate (IRON PO) Take by mouth daily.      losartan (COZAAR) 100 MG tablet       mirtazapine (REMERON) 7.5 MG tablet Take 7.5 mg by mouth at bedtime.      predniSONE (DELTASONE) 5 MG tablet Take 1 tablet (5 mg total) by mouth daily with breakfast. 30 tablet 1    rosuvastatin (CRESTOR) 40 MG tablet Take 40 mg by mouth daily.      Sarilumab (KEVZARA) 200 MG/1.14ML SOAJ Inject 200 mg into the skin every 21 ( twenty-one) days. (Patient not taking: Reported on 02/17/2022) 1.14 mL 4    Sarilumab (KEVZARA) 200 MG/1.14ML SOAJ Inject 200 mg into the skin every 21 ( twenty-one) days. 1.14 mL 1     Current medications: Current Facility-Administered  Medications  Medication Dose Route Frequency Provider Last Rate Last Admin   acetaminophen (TYLENOL) tablet 650 mg  650 mg Oral Q6H PRN George Hugh, MD       Or   acetaminophen (TYLENOL) suppository 650 mg  650 mg Rectal Q6H PRN George Hugh, MD       amLODipine (NORVASC) tablet 10 mg  10 mg Oral q morning George Hugh, MD   10 mg at 04/11/22 0919   cefTRIAXone (ROCEPHIN) 1 g in sodium chloride 0.9 % 100 mL IVPB  1 g Intravenous Q24H George Hugh, MD   Stopped at 04/10/22 2308   feeding supplement (GLUCERNA SHAKE) (GLUCERNA SHAKE) liquid 237 mL  237 mL Oral TID BM George Hugh, MD   237 mL at 04/10/22 2118   heparin injection 5,000 Units  5,000 Units Subcutaneous Philipp Ovens, MD   5,000 Units at 04/11/22 [phone]  lactated ringers infusion   Intravenous Continuous Lateef, Munsoor, MD       ondansetron (ZOFRAN) tablet 4 mg  4 mg Oral Q6H PRN George Hugh, MD       Or   ondansetron (ZOFRAN) injection 4 mg  4 mg Intravenous Q6H PRN George Hugh, MD       predniSONE (DELTASONE) tablet 20 mg  20 mg Oral Q breakfast George Hugh, MD   20 mg at 04/10/22 2052   senna-docusate (Senokot-S) tablet 1 tablet  1 tablet Oral QHS PRN George Hugh, MD       traZODone (DESYREL) tablet 50 mg  50 mg Oral QHS PRN George Hugh, MD       Facility-Administered Medications Ordered in Other Encounters  Medication Dose Route Frequency Provider Last Rate Last Admin   perflutren lipid microspheres (DEFINITY) IV suspension  1-10 mL Intravenous PRN George Hugh, MD   5 mL at 04/11/22 1139      Allergies: No Known Allergies    Past Medical History: Past Medical History:  Diagnosis Date   Anemia    High risk medication use 06/02/2016   Hypertension 06/02/2016   Neutropenia ([place])    Prediabetes    Rheumatoid arthritis involving multiple sites with positive rheumatoid factor ([place]) 06/02/2016     Past Surgical History: History reviewed. No pertinent surgical history.   Family  History: Family History  Problem Relation Age of Onset   Heart disease Mother    Diabetes Mother    Breast cancer Mother    Kidney disease Father    Diabetes Father    Diabetes Daughter      Social History: Social History   Socioeconomic History   Marital status: Married    Spouse name: Not on file   Number of children: Not on file   Years of education: Not on file   Highest education level: Not on file  Occupational History   Not on file  Tobacco Use   Smoking status: Former    Packs/day: 0.50    Years: 7.00    Total pack years: 3.50    Types: Cigarettes    Quit date: 03/15/1978    Years since quitting: 44.1    Passive exposure: Past   Smokeless tobacco: Never  Vaping Use   Vaping Use: Never used  Substance and Sexual Activity   Alcohol use: Not Currently   Drug use: No   Sexual activity: Not on file  Other Topics Concern   Not on file  Social History Narrative   Not on file   Social Determinants of Health   Financial Resource Strain: Not on file  Food Insecurity: Not on file  Transportation Needs: Not on file  Physical Activity: Not on file  Stress: Not on file  Social Connections: Not on file  Intimate Partner Violence: Not on file     Review of Systems: Review of Systems  Constitutional:  Positive for weight loss.    Vital Signs: Blood pressure 102/67, pulse 67, temperature 98.3 F (36.8 C), temperature source Oral, resp. rate 20, height '5\' 11"'$  (1.803 m), weight 79.4 kg, SpO2 100 %.  Weight trends: Filed Weights   04/10/22 1224  Weight: 79.4 kg    Physical Exam: General: NAD,   Head: Normocephalic, atraumatic. Moist oral mucosal membranes  Eyes: Anicteric, PERRL  Neck: Supple, trachea midline  Lungs:  Clear to auscultation  Heart: Regular rate and rhythm  Abdomen:  Soft, nontender,   Extremities:  no peripheral edema.  Neurologic: Nonfocal, moving all four extremities  Skin: No lesions  Access: None      Lab results: Basic  Metabolic Panel: Recent Labs  Lab 04/10/22 1254 04/11/22 0509  NA 138 138  K 4.5 5.2*  CL 108 113*  CO2 18* 19*  GLUCOSE 95 106*  BUN 87* 84*  CREATININE 4.72* 4.71*  CALCIUM 11.1* 10.0    Liver Function Tests: Recent Labs  Lab 04/10/22 1254  AST 32  ALT 22  ALKPHOS 94  BILITOT 1.1  PROT 8.7*  ALBUMIN 3.3*   No results for input(s): "LIPASE", "AMYLASE" in the last 168 hours. No results for input(s): "AMMONIA" in the last 168 hours.  CBC: Recent Labs  Lab 04/10/22 1254 04/11/22 0509  WBC 4.0 4.4  HGB 10.9* 9.0*  HCT 35.1* 28.8*  MCV 88.2 87.0  PLT 226 182    Cardiac Enzymes: Recent Labs  Lab 04/10/22 2035  CKTOTAL 104    BNP: Invalid input(s): "POCBNP"  CBG: No results for input(s): "GLUCAP" in the last 168 hours.  Microbiology: Results for orders placed or performed during the hospital encounter of 04/10/22  Resp Panel by RT-PCR (Flu A&B, Covid) Anterior Nasal Swab     Status: None   Collection Time: 04/10/22 12:54 PM   Specimen: Anterior Nasal Swab  Result Value Ref Range Status   SARS Coronavirus 2 by RT PCR NEGATIVE NEGATIVE Final    Comment: (NOTE) SARS-CoV-2 target nucleic acids are NOT DETECTED.  The SARS-CoV-2 RNA is generally detectable in upper respiratory specimens during the acute phase of infection. The lowest concentration of SARS-CoV-2 viral copies this assay can detect is 138 copies/mL. A negative result does not preclude SARS-Cov-2 infection and should not be used as the sole basis for treatment or other patient management decisions. A negative result may occur with  improper specimen collection/handling, submission of specimen other than nasopharyngeal swab, presence of viral mutation(s) within the areas targeted by this assay, and inadequate number of viral copies(<138 copies/mL). A negative result must be combined with clinical observations, patient history, and epidemiological information. The expected result is  Negative.  Fact Sheet for Patients:  EntrepreneurPulse.com.au  Fact Sheet for Healthcare Providers:  IncredibleEmployment.be  This test is no t yet approved or cleared by the [place] FDA and  has been authorized for detection and/or diagnosis of SARS-CoV-2 by FDA under an Emergency Use Authorization (EUA). This EUA will remain  in effect (meaning this test can be used) for the duration of the COVID-19 declaration under Section 564(b)(1) of the Act, 21 U.S.C.section 360bbb-3(b)(1), unless the authorization is terminated  or revoked sooner.       Influenza A by PCR NEGATIVE NEGATIVE Final   Influenza B by PCR NEGATIVE NEGATIVE Final    Comment: (NOTE) The Xpert Xpress SARS-CoV-2/FLU/RSV plus assay is intended as an aid in the diagnosis of influenza from Nasopharyngeal swab specimens and should not be used as a sole basis for treatment. Nasal washings and aspirates are unacceptable for Xpert Xpress SARS-CoV-2/FLU/RSV testing.  Fact Sheet for Patients: EntrepreneurPulse.com.au  Fact Sheet for Healthcare Providers: IncredibleEmployment.be  This test is not yet approved or cleared by the [place] FDA and has been authorized for detection and/or diagnosis of SARS-CoV-2 by FDA under an Emergency Use Authorization (EUA). This EUA will remain in effect (meaning this test can be used) for the duration of the COVID-19 declaration under Section 564(b)(1) of the Act, 21 U.S.C. section 360bbb-3(b)(1), unless the authorization is terminated or revoked.  Performed at [facility], [address] [place], [place] [address]   Culture, blood (Routine X 2) w Reflex to ID Panel     Status: None (Preliminary result)   Collection Time: 04/10/22  9:42 PM   Specimen: BLOOD  Result Value Ref Range Status   Specimen Description BLOOD LEFT ASSIST CONTROL  Final   Special Requests   Final    BOTTLES DRAWN  AEROBIC AND ANAEROBIC Blood Culture adequate volume   Culture   Final    NO GROWTH < 12 HOURS Performed at [facility], [address], [place], [place] [address]    Report Status PENDING  Incomplete  Culture, blood (Routine X 2) w Reflex to ID Panel     Status: None (Preliminary result)   Collection Time: 04/10/22  9:42 PM   Specimen: BLOOD  Result Value Ref Range Status   Specimen Description BLOOD LEFT HAND  Final   Special Requests IN PEDIATRIC BOTTLE Blood Culture adequate volume  Final   Culture   Final    NO GROWTH < 12 HOURS Performed at [facility], [address], [place], [place] [address]    Report Status PENDING  Incomplete    Coagulation Studies: No results for input(s): "LABPROT", "INR" in the last 72 hours.  Urinalysis: Recent Labs    04/10/22 1254  COLORURINE YELLOW*  LABSPEC 1.015  PHURINE 5.0  GLUCOSEU NEGATIVE  HGBUR MODERATE*  BILIRUBINUR NEGATIVE  KETONESUR NEGATIVE  PROTEINUR 100*  NITRITE NEGATIVE  LEUKOCYTESUR NEGATIVE      Imaging: MR ABDOMEN WO CONTRAST  Result Date: 04/10/2022 CLINICAL DATA:  Left renal mass incidentally identified by ultrasound EXAM: MRI ABDOMEN WITHOUT CONTRAST TECHNIQUE: Multiplanar multisequence MR imaging was performed without the administration of intravenous contrast. COMPARISON:  Renal ultrasound, 04/10/2022 FINDINGS: Lower chest: No acute abnormality. Hepatobiliary: No solid liver abnormality is seen. Mildly distended gallbladder. No gallstones, gallbladder wall thickening, or biliary dilatation. Pancreas: Unremarkable. No pancreatic ductal dilatation or surrounding inflammatory changes. Spleen: Normal in size without significant abnormality. Adrenals/Urinary Tract: Adrenal glands are unremarkable. There is a heterogeneous mass of the posterior lip of the right kidney extending into the renal sinus, measuring 5.3 x 4.3 cm (series 4, image 17). Numerous bilateral small, simple and thinly septated  benign renal cortical cysts, for which no further follow-up or characterization is required. No hydronephrosis. Stomach/Bowel: Stomach is within normal limits. No evidence of bowel wall thickening, distention, or inflammatory changes. Vascular/Lymphatic: No significant vascular findings are present. No enlarged abdominal lymph nodes. Other: No abdominal wall hernia or abnormality. No ascites. Musculoskeletal: No acute or significant osseous findings. IMPRESSION: 1. Heterogeneous mass of the posterior lip of the right kidney extending into the renal sinus, measuring 5.3 x 4.3 cm. 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 2. No evidence of lymphadenopathy, obvious renal vein invasion, or obvious abdominal metastatic disease on noncontrast examination. Electronically Signed   By: Delanna Ahmadi M.D.   On: 04/10/2022 19:03   US Renal  Result Date: 04/10/2022 CLINICAL DATA:  Renal failure. EXAM: RENAL / URINARY TRACT ULTRASOUND COMPLETE COMPARISON:  None Available. FINDINGS: Right Kidney: Renal measurements: 11.2 x 5.4 x 5.4 cm = volume: 172 mL. 2.2 cm simple cyst is noted in lower pole. Increased echogenicity of renal parenchyma is noted suggesting medical renal disease. No mass or hydronephrosis visualized. Left Kidney: Renal measurements: 12.9 x 7.0 x 6.0 cm = volume: 284 mL. Increased echogenicity  of renal parenchyma is noted suggesting medical renal disease. 5.2 cm heterogeneous solid mass is noted medially concerning for renal cell carcinoma. 1.5 cm simple cyst is noted in lower pole. No hydronephrosis is noted. Bladder: Appears normal for degree of bladder distention. Other: None. IMPRESSION: 5.2 cm heterogeneous solid mass seen in left kidney concerning for renal cell carcinoma. Further evaluation with MRI or CT scan is recommended. Increased echogenicity of renal parenchyma is noted bilaterally suggesting medical renal disease.  Electronically Signed   By: Marijo Conception M.D.   On: 04/10/2022 16:56   CT HEAD WO CONTRAST (5MM)  Result Date: 04/10/2022 CLINICAL DATA:  Altered mental change EXAM: CT HEAD WITHOUT CONTRAST TECHNIQUE: Contiguous axial images were obtained from the base of the skull through the vertex without intravenous contrast. RADIATION DOSE REDUCTION: This exam was performed according to the departmental dose-optimization program which includes automated exposure control, adjustment of the mA and/or kV according to patient size and/or use of iterative reconstruction technique. COMPARISON:  MR brain done on 01/14/2022 FINDINGS: Brain: No acute intracranial findings are seen. There are no signs of bleeding within the cranium. There are possible old lacunar infarcts in the cerebellum on both sides. Cortical sulci are prominent. There is no focal edema or mass effect. Vascular: Scattered arterial calcifications are seen. Skull: Unremarkable. Sinuses/Orbits: Unremarkable. Other: None. IMPRESSION: No acute intracranial findings are seen in noncontrast CT brain. Atrophy. 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 Electronically Signed   By: Elmer Picker M.D.   On: 04/10/2022 14:04   DG Chest 2 View  Result Date: 04/10/2022 CLINICAL DATA:  Weakness and cough EXAM: CHEST - 2 VIEW COMPARISON:  None Available. FINDINGS: Normal mediastinum and cardiac silhouette. Normal pulmonary vasculature. No evidence of effusion, infiltrate, or pneumothorax. No acute bony abnormality. Degenerative osteophytosis of the spine. IMPRESSION: No acute cardiopulmonary process. Electronically Signed   By: Suzy Bouchard M.D.   On: 04/10/2022 13:43     Assessment & Plan: Mr. Careem Yasui. is a 71 y.o.  male with , who was admitted to [facility] on 04/10/2022 for evaluation of AKI with a history of anemia, HTN, RA.    Acute Kidney injury- likely secondary to poor oral intake with significant weight loss.  Patients  baseline creatinine is approximatley 1.14 ( July 2023) and is elevated to 4.7.  Continue IV hydration and monitor creatinine. Holding losartan.   Left Renal Mass  - 5.2 cm heterogeneous solid mass seen on Ultra sound. Concerning for renal cell carcinoma . Primary team has ordered an MRI to further evaluate. Urology has already been consulted by primary team.   3. Hypertension- off of losartan at this time. BP 102/67        LOS: 0 Raygan Skarda P Diyana Starrett 9/2/20231:04 PM

## 2022-04-11 NOTE — Progress Notes (Signed)
Pt's wallet returned to pt from MRI.

## 2022-04-11 NOTE — Progress Notes (Signed)
Progress Note   Patient: Michael Hinton. JOI:[id number] DOB: [birth year] DOA: 04/10/2022     0 DOS: the patient was seen and examined on 04/11/2022   Brief hospital course:  Michael Hinton. is a 71 y.o. 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   His creatinine has increased from 1.14 to 4.72 mg/dL with GFR of 13 mL/min over approximately one month.  Renal ultrasound shows a "5.2 cm heterogeneous solid mass" that is concerning for renal cell carcinoma.    9/2: Urology, renal consulted, pending oncology consult. Urology reports no biopsy needed. AKI unchanged, foley in place.   Assessment and Plan:  Acute Kidney Injury Hyperkalemia multi-factorial- this is secondary to poor oral intake/dehydration, component of intra-renal disease, and post-renal obstruction. Creatinine has increased from 1.14 to 4.72 mg/dL over approximately 33 days. Foley in place. Renal US no evidence of obstruction.  - Continue LR Lactated Ringer 100 CC/hr - repeat BMP BID - Consult Nephrology given significant renal injury, appreciate assistance and recommendations. - Hold Losartan   5.2 cm Left Renal Mass: Ultrasound reports "5.2 cm heterogeneous solid mass seen in left kidney concerning for renal cell carcinoma." MRI findings c/w RCC.  - Consult Oncology and Urology to evaluate renal mass, appreciate assistance and recommendations.  - pending onc recs, may not need biopsy per urology, holding plavix - Nephrectomy can be completed by Urology outpatient.  Hypercalcemia in the setting of Renal Mass: Calcium is 11.1 mg/dL, improved to 10 after IVF - Continue fluids as above.  Repeat BMP in am. - PTH, PTHrP, and 1,25 Vit D  pending    UTI: UA shows no leukocytes or nitrites and 11-20 WBCs, many bacteria. Culture pending.  - Blood and Urine Cultures pending, no growth to date - ceftriaxone started 9/1   History of Multiple Small Cerebellar Infarcts:  Noted on OP MRI. 02/21/22 Cardiology note reports a ordering an Echo and Holter Monitor.  Patient admits he did not have this completed.  - Hold Plavix for now as patient may be able to have biopsy completed while inpatient - Monitor on telemetry for atrial fibrillation - Check Echo and carotid US to complete stroke workup   Rheumatoid Arthritis: Patient is currently on Sarilumab (IL-6i) and Prednisone 5 mg daily.  Per [employer]omp in terms of renal function "There are no dosage adjustments provided in the manufacturer's labeling (has not been studied)." Sarilumab also does not define any clear relationship or impact on the development or course of malignancy. Patient endorses severe right arm pain. -increase Prednisone to 20 mg daily.  This can be tapered once symptoms improve.   -Give Tramadol 25 mg x once to see if this helps relieve pain - Follow up with Rheumatology outpatient   Poor Oral Intake / Insomnia: - continue Mirtazapine 7.5 mg - Start Milkshake supplements TID.  Essential Hypertension: BP is stable. - Continue home medications Amlodipine 10 mg daily.  Hold Losartan given AKI.   Hyperlipidemia:  - Continue Statin    Body mass index is 24.41 kg/m.  Level of care: Inpatient DVT prophylaxis: Heparin (due to renal function) Code Status: Full Family Communication: None present; I spoke with the patient at bedside. Disposition Plan:  The patient is from: home  Anticipated d/c is to: home without [facility] services              Anticipated d/c date will depend on clinical response to treatment, but we estimate <48 hours.             Patient is currently: acutely ill Consults called: Urology Admission status:  Observation      Subjective:  Feels well. No acute concerns.   Physical Exam: Vitals:   04/11/22 0000 04/11/22 0433 04/11/22 0515 04/11/22 0733  BP: 103/61 100/65 100/62 102/67  Pulse: 74 74 68 67  Resp: '18 16 20 20  '$ Temp: 98 F (36.7 C) 98.7 F (37.1 C) 98.3 F (36.8 C) 98.3 F (36.8 C)  TempSrc: Oral  Oral Oral  SpO2: 98% 99%  100%  Weight:      Height:       Physical Exam Vitals and nursing note reviewed.  Constitutional:      General: He is not in acute distress.    Appearance: He is not diaphoretic.  HENT:     Head: Atraumatic.  Eyes:     Pupils: Pupils are equal, round, and reactive to light.  Cardiovascular:     Rate and Rhythm: Normal rate and regular rhythm.     Pulses: Normal pulses.     Heart sounds: No murmur heard. Pulmonary:     Effort: Pulmonary effort is normal. No respiratory distress.     Breath sounds: Normal breath sounds. No rales.  Abdominal:     General: Abdomen is flat. There is no distension.     Palpations: Abdomen is soft.     Tenderness: There is no abdominal tenderness. There is no rebound.  Musculoskeletal:     Right lower leg: No edema.     Left lower leg: No edema.  Skin:    General: Skin is warm and dry.     Capillary Refill: Capillary refill takes less than 2 seconds.  Neurological:     Mental Status: He is alert and oriented to person, place, and time. Mental status is at baseline.  Psychiatric:        Mood and Affect: Mood normal.     Data Reviewed:      Latest Ref Rng & Units 04/11/2022    5:09 AM 04/10/2022   12:54 PM 03/06/2022    1:42 PM  CBC  WBC 4.0 - 10.5 K/uL 4.4  4.0  2.5   Hemoglobin 13.0 - 17.0 g/dL 9.0  10.9  10.1   Hematocrit 39.0 - 52.0 % 28.8  35.1  30.9   Platelets 150 - 400 K/uL 182  226  222        Latest Ref Rng & Units 04/11/2022    5:09 AM 04/10/2022   12:54 PM 03/06/2022    1:42 PM  BMP  Glucose 70 - 99 mg/dL 106  95  89   BUN 8 - 23 mg/dL 84  87  29   Creatinine 0.61 - 1.24 mg/dL 4.71  4.72  1.14   BUN/Creat Ratio 6 - 22  (calc)   25   Sodium 135 - 145 mmol/L 138  138  137   Potassium 3.5 - 5.1 mmol/L 5.2  4.5  4.1   Chloride 98 - 111 mmol/L 113  108  105   CO2 22 - 32 mmol/L '19  18  24   '$ Calcium 8.9 - 10.3 mg/dL 10.0  11.1  9.5    BC NGTD Pr/Cr 1.12 UA  with bacteria, cultre pending Cortisol 15.2  MRI 01/2022: 1. Negative internal auditory canal imaging. No retrocochlear lesion. 2. 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  CT head: No acute intracranial findings are seen in noncontrast CT brain. Atrophy. 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  Renal US: 5.2 cm heterogeneous solid mass seen in left kidney concerning for renal cell carcinoma. Further evaluation with MRI or CT scan is recommended.   Increased echogenicity of renal parenchyma is noted bilaterally suggesting medical renal disease.  MRI abd: 1. Heterogeneous mass of the posterior lip of the right kidney extending into the renal sinus, measuring 5.3 x 4.3 cm. 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 2. No evidence of lymphadenopathy, obvious renal vein invasion, or obvious abdominal metastatic disease on noncontrast examination.  Family Communication: d/w wife at bedside  Disposition: Status is: Observation The patient will require care spanning > 2 midnights and should be moved to inpatient because: severity of illness  Planned Discharge Destination: Home    Time spent: 45 minutes  Author: Lorelei Pont, MD 04/11/2022 1:38 PM  For on call review [email or web address].

## 2022-04-11 NOTE — Consult Note (Signed)
I have been asked to see the patient by Dr. George Hugh for evaluation and management of renal mass.  History of present illness: 71 yo man presented to ED with poor PO intake over past 2 weeks found to have AKI with Cr 4.72 from baseline 1.14.  Renal US showed no hydronephrosis however pt was found to have 5 cm left renal mass concerning for RCC.  Urinalysis also looks to be consistent with infection.  A foley catheter was placed last night.  Urology consulted to discuss renal mass.  Patient denies flank pain or gross hematuria.  He states he's been voiding very little over the past few weeks.  He has a history of RA and CVA and remains on plavix.   Review of systems: A 12 point comprehensive review of systems was obtained and is negative unless otherwise stated in the history of present illness.  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   Current Outpatient Medications on File Prior to Encounter  Medication Sig Dispense Refill   amLODipine (NORVASC) 10 MG tablet Take 10 mg by mouth every morning.     atorvastatin (LIPITOR) 20 MG tablet      atorvastatin (LIPITOR) 40 MG tablet Take 40 mg by mouth daily.     Cholecalciferol (VITAMIN D-3) 125 MCG (5000 UT) TABS Take 1 tablet by mouth daily.     clopidogrel (PLAVIX) 75 MG tablet Take 75 mg by mouth daily.     Ferrous Sulfate (IRON PO) Take by mouth daily.     losartan (COZAAR) 100 MG tablet      mirtazapine (REMERON) 7.5 MG  tablet Take 7.5 mg by mouth at bedtime.     predniSONE (DELTASONE) 5 MG tablet Take 1 tablet (5 mg total) by mouth daily with breakfast. 30 tablet 1   rosuvastatin (CRESTOR) 40 MG tablet Take 40 mg by mouth daily.     Sarilumab (KEVZARA) 200 MG/1.14ML SOAJ Inject 200 mg into the skin every 21 ( twenty-one) days. (Patient not taking: Reported on 02/17/2022) 1.14 mL 4   Sarilumab (KEVZARA) 200 MG/1.14ML SOAJ Inject 200 mg into the skin every 21 ( twenty-one) days. 1.14 mL 1    Past Medical History:  Diagnosis Date   Anemia    High risk medication use 06/02/2016   Hypertension 06/02/2016   Neutropenia (HCC)    Prediabetes    Rheumatoid arthritis involving multiple sites with positive rheumatoid factor ([place]) 06/02/2016    History reviewed. No pertinent surgical history.  Social History   Tobacco Use   Smoking status: Former    Packs/day: 0.50    Years: 7.00    Total pack years: 3.50    Types: Cigarettes    Quit date: 03/15/1978    Years since quitting: 44.1    Passive exposure: Past   Smokeless tobacco: Never  Vaping Use   Vaping Use: Never used  Substance Use Topics   Alcohol use: Not Currently   Drug use: No    Family History  Problem Relation Age of Onset   Heart disease Mother    Diabetes Mother    Breast cancer Mother    Kidney disease Father    Diabetes Father    Diabetes Daughter     PE: Vitals:   04/11/22 0000 04/11/22 0433 04/11/22 0515 04/11/22 0733  BP: 103/61 100/65 100/62 102/67  Pulse: 74 74 68 67  Resp: '18 16 20 20  '$ Temp: 98 F (36.7 C) 98.7 F (37.1 C) 98.3 F (36.8 C) 98.3 F (36.8 C)  TempSrc: Oral  Oral Oral  SpO2: 98% 99%  100%  Weight:      Height:       Patient appears to be in no acute distress  patient is alert and oriented x3 Atraumatic normocephalic head No cervical or supraclavicular lymphadenopathy appreciated No increased work of breathing, no audible wheezes/rhonchi Regular sinus rhythm/rate Abdomen is soft, nontender,  nondistended, no CVA or suprapubic tenderness GU foley draining clear yellow urine Grossly neurologically intact No identifiable skin lesions  Recent Labs    04/10/22 1254 04/11/22 0509  WBC 4.0 4.4  HGB 10.9* 9.0*  HCT 35.1* 28.8*   Recent Labs    04/10/22 1254 04/11/22 0509  NA 138 138  K 4.5 5.2*  CL 108 113*  CO2 18* 19*  GLUCOSE 95 106*  BUN 87* 84*  CREATININE 4.72* 4.71*  CALCIUM 11.1* 10.0   No results for input(s): "LABPT", "INR" in the last 72 hours. No results for input(s): "LABURIN" in the last 72 hours. Results for orders placed or performed during the hospital encounter of 04/10/22  Resp Panel by RT-PCR (Flu A&B, Covid) Anterior Nasal Swab     Status: None   Collection Time: 04/10/22 12:54 PM   Specimen: Anterior Nasal Swab  Result Value Ref Range Status   SARS Coronavirus 2 by RT PCR NEGATIVE NEGATIVE Final    Comment: (NOTE) SARS-CoV-2 target nucleic acids are NOT DETECTED.  The SARS-CoV-2 RNA is generally detectable in upper respiratory specimens during the acute phase of infection. The lowest concentration of SARS-CoV-2 viral copies this assay can detect is 138 copies/mL. A negative result does not preclude SARS-Cov-2 infection and should not be used as the sole basis for treatment or other patient management decisions. A negative result may occur with  improper specimen collection/handling, submission of specimen other than nasopharyngeal swab, presence of viral mutation(s) within the areas targeted by this assay, and inadequate number of viral copies(<138 copies/mL). A negative result must be combined with clinical observations, patient history, and epidemiological information. The expected result is Negative.  Fact Sheet for Patients:  EntrepreneurPulse.com.au  Fact Sheet for Healthcare Providers:  IncredibleEmployment.be  This test is no t yet approved or cleared by the [place] FDA and  has been  authorized for detection and/or diagnosis of SARS-CoV-2 by FDA under an Emergency Use Authorization (EUA). This EUA will remain  in effect (meaning this test can be used) for the duration of the COVID-19 declaration under Section 564(b)(1) of the Act, 21 U.S.C.section 360bbb-3(b)(1), unless the authorization is terminated  or revoked sooner.       Influenza A by PCR NEGATIVE NEGATIVE Final   Influenza B by PCR NEGATIVE NEGATIVE Final    Comment: (NOTE) The Xpert Xpress SARS-CoV-2/FLU/RSV plus assay is intended as an aid in the diagnosis of influenza from Nasopharyngeal swab specimens and should not be used as a sole basis for treatment. Nasal washings and aspirates are unacceptable for Xpert Xpress SARS-CoV-2/FLU/RSV testing.  Fact Sheet for Patients: EntrepreneurPulse.com.au  Fact Sheet for Healthcare Providers: IncredibleEmployment.be  This test is not yet approved or cleared by the [place] FDA and has been authorized for detection and/or diagnosis of SARS-CoV-2 by FDA under an Emergency Use Authorization (EUA). This EUA will remain in effect (meaning this test can be used) for the duration of the COVID-19 declaration under Section 564(b)(1) of the Act, 21 U.S.C. section 360bbb-3(b)(1), unless the authorization is terminated or revoked.  Performed at [facility], [address] [place], [place] [address]   Culture, blood (Routine X 2) w Reflex to ID Panel     Status: None (Preliminary result)   Collection Time: 04/10/22  9:42 PM   Specimen: BLOOD  Result Value Ref Range Status   Specimen Description BLOOD LEFT ASSIST CONTROL  Final   Special Requests   Final    BOTTLES DRAWN AEROBIC AND ANAEROBIC Blood Culture adequate volume   Culture   Final    NO GROWTH < 12 HOURS Performed at [facility], [address], [place], [place] [address]    Report Status PENDING  Incomplete  Culture, blood (Routine X 2) w  Reflex to ID Panel     Status: None (Preliminary result)   Collection Time: 04/10/22  9:42 PM   Specimen: BLOOD  Result Value Ref Range Status   Specimen Description BLOOD LEFT HAND  Final   Special Requests IN PEDIATRIC BOTTLE Blood Culture adequate volume  Final   Culture   Final    NO GROWTH < 12 HOURS Performed at [facility], [address], [place],  [address]    Report Status PENDING  Incomplete    Imaging: MR Abdomen without contrast 04/10/22 IMPRESSION: 1. Heterogeneous mass of the posterior lip of the left kidney extending into the renal sinus, measuring 5.3 x 4.3 cm. 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 2. No evidence of lymphadenopathy, obvious renal vein invasion, or obvious abdominal metastatic disease on noncontrast examination.     Electronically Signed   By: Delanna Ahmadi M.D.   On: 04/10/2022 19:03  Renal US 04/10/22 IMPRESSION: 5.2 cm heterogeneous solid mass seen in left kidney concerning for renal cell carcinoma. Further evaluation with MRI or CT scan is recommended.   Increased echogenicity of renal parenchyma is noted bilaterally suggesting medical renal disease.     Electronically Signed   By: Marijo Conception M.D.   On: 04/10/2022 16:56    Chest X Ray 04/10/22 IMPRESSION: No acute cardiopulmonary process.     Electronically Signed   By: Suzy Bouchard M.D.   On: 04/10/2022 13:43   Impression/Recommendations: LEFT renal mass: Imaging concerning for RCC.  No nodes on limited study of MRI without contrast.  Patient will need right radical nephrectomy to be scheduled in controlled setting when patient is optimized.  Biopsy not necessary in this setting. Discussed with patient how a nephrectomy is performed and post operative recovery.  His renal function will need to be further evaluated/stabilized prior to scheduling this.  Will arrange  outpatient follow up. AKI: No evidence of hydronephrosis on renal US or MRI.  Bladder moderately distended on renal US.  Place foley and see if any improvement in renal function.   UTI: Continue antibiotics and await urine culture results  Thank you for involving me in this patient's care. Please page with any further questions or concerns.  Myra Weng D Taija Mathias

## 2022-04-11 NOTE — Plan of Care (Signed)

## 2022-04-11 NOTE — Hospital Course (Addendum)
Michael Hinton. is a 71 y.o. 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   His creatinine has increased from 1.14 to 4.72 mg/dL with GFR of 13 mL/min over approximately one month.  Renal ultrasound shows a "5.2 cm heterogeneous solid mass" that is concerning for renal cell carcinoma.    9/2: Urology, renal consulted, pending oncology consult. Urology reports no biopsy needed, holding plavix until onc confirms. AKI unchanged, foley in place, continue IVF. On abx for UTI. Completing prior stroke work up.

## 2022-04-11 NOTE — Progress Notes (Signed)
*  PRELIMINARY RESULTS* Echocardiogram 2D Echocardiogram has been performed. Definity IV ultrasound imaging agent used on this study.  Michael Hinton 04/11/2022, 11:39 AM

## 2022-04-12 DIAGNOSIS — R531 Weakness: Secondary | ICD-10-CM | POA: Diagnosis not present

## 2022-04-12 DIAGNOSIS — N179 Acute kidney failure, unspecified: Secondary | ICD-10-CM | POA: Diagnosis not present

## 2022-04-12 DIAGNOSIS — D631 Anemia in chronic kidney disease: Secondary | ICD-10-CM

## 2022-04-12 DIAGNOSIS — R634 Abnormal weight loss: Secondary | ICD-10-CM

## 2022-04-12 DIAGNOSIS — R1310 Dysphagia, unspecified: Secondary | ICD-10-CM

## 2022-04-12 DIAGNOSIS — N2889 Other specified disorders of kidney and ureter: Secondary | ICD-10-CM | POA: Diagnosis not present

## 2022-04-12 DIAGNOSIS — D649 Anemia, unspecified: Secondary | ICD-10-CM

## 2022-04-12 LAB — PARATHYROID HORMONE, INTACT (NO CA): PTH: 7 pg/mL — ABNORMAL LOW (ref 15–65)

## 2022-04-12 LAB — TECHNOLOGIST SMEAR REVIEW: Plt Morphology: NORMAL

## 2022-04-12 LAB — RETIC PANEL
Immature Retic Fract: 16 % — ABNORMAL HIGH (ref 2.3–15.9)
RBC.: 3.06 MIL/uL — ABNORMAL LOW (ref 4.22–5.81)
Retic Count, Absolute: 37 10*3/uL (ref 19.0–186.0)
Retic Ct Pct: 1.2 % (ref 0.4–3.1)
Reticulocyte Hemoglobin: 31.2 pg (ref >27.9)

## 2022-04-12 LAB — URINE CULTURE: Culture: NO GROWTH

## 2022-04-12 LAB — CBC
HCT: 26.5 % — ABNORMAL LOW (ref 39.0–52.0)
Hemoglobin: 8.4 g/dL — ABNORMAL LOW (ref 13.0–17.0)
MCH: 27.5 pg (ref 26.0–34.0)
MCHC: 31.7 g/dL (ref 30.0–36.0)
MCV: 86.6 fL (ref 80.0–100.0)
Platelets: 157 10*3/uL (ref 150–400)
RBC: 3.06 MIL/uL — ABNORMAL LOW (ref 4.22–5.81)
RDW: 13.9 % (ref 11.5–15.5)
WBC: 3.2 10*3/uL — ABNORMAL LOW (ref 4.0–10.5)
nRBC: 0 % (ref 0.0–0.2)

## 2022-04-12 MED ORDER — CHLORHEXIDINE GLUCONATE CLOTH 2 % EX PADS
6.0000 | MEDICATED_PAD | Freq: Every day | CUTANEOUS | Status: DC
  Administered 2022-04-12 – 2022-04-14 (×3): 6 via TOPICAL

## 2022-04-12 MED ORDER — TRAMADOL HCL 50 MG PO TABS
25.0000 mg | ORAL_TABLET | Freq: Once | ORAL | Status: AC
  Administered 2022-04-12: 25 mg via ORAL
  Filled 2022-04-12: qty 1

## 2022-04-12 MED ORDER — METHYLPREDNISOLONE SODIUM SUCC 125 MG IJ SOLR
125.0000 mg | Freq: Once | INTRAMUSCULAR | Status: AC
  Administered 2022-04-12: 125 mg via INTRAVENOUS
  Filled 2022-04-12: qty 2

## 2022-04-12 MED ORDER — SODIUM BICARBONATE 8.4 % IV SOLN
INTRAVENOUS | Status: AC
  Filled 2022-04-12: qty 150

## 2022-04-12 NOTE — Progress Notes (Signed)
Progress Note   Patient: Michael Hinton. KGU:[id number] DOB: 11/16/1950 DOA: 04/10/2022     1 DOS: the patient was seen and examined on 04/12/2022   Brief hospital course:  Michael Hinton. is a 71 y.o. 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  Nephrology is following and recommends gentle fluids.  Urology and Heme/Onc recommend biopsy/nephrectomy outpatient.  Assessment and Plan:  AKI with metabolic acidosis: - Creatinine improved from 4.71 to 4.68 mg/dL.  BC is 15 mmol/L.   - Continue fluids with bicarbonate for gentle hydration.  Monitor renal function.  Appreciate Nephrology assistance and recommendations. - Hold ARB and other nephrotoxic medications.  5.3 cm Left Renal Mass: MRI of the abdomen reports, "heterogeneous mass of the posterior lip of the right kidney extending into the renal sinus, measuring 5.3 x 4.3 cm." - Oncology and Urology recommend biopsy/nephrectomy outpatient, appreciate assistance and recommendations.  Hypercalcemia, resolved: Calcium is 10 mg/dL. - PTH is low and PTHrP is pending.    Chills, resolved: - Urinalysis was relatively benign with negative leukocytes and nitrites.  WBC was 11-20.  Urine cultures and Blood Cultures show no growth. - Discontinue antibiotics.  Rheumatoid Arthritis: At home, patient is on Sarilumab (IL-6i) and Prednisone 5 mg daily.  In terms of renal function, Lexicomp reports "There are no dosage adjustments provided in the manufacturer's labeling (has not been studied)" - so it is ok to continue outpatient.  Patient continues to complain of severe right shoulder pain. - Give IV Solumedrol 125 mg x once with Tramadol 25 mg x once. -  Continue higher dose steroids 20 mg daily and taper outpatient when symptoms improve. - Follow up with Rheumatology.   History of Multiple Small Cerebellar Infarcts:  - Continue Plavix and Statin. - Echo showed mild left atrial dilation in the setting of multiple small infarcts concerning for possible Afib.   - Order Holter monitor on discharge and follow up with Cardiology outpatient.  Grade 1 Diastolic Heart Failure: - Patient is euvolemic.   Poor Oral Intake / Insomnia: - Continue Mirtazapine 7.5 mg and Milkshake supplements TID.  Essential Hypertension: BP is stable. - Continue home medications Amlodipine 10 mg daily.  Hold Losartan given AKI.   Hyperlipidemia:  - Continue Statin   Physical Deconditioning: Patient remains weak. - Consult PT/OT in am.  Body mass index is 24.41 kg/m.  Level of care: Inpatient DVT prophylaxis: Heparin (due to renal function) Code Status: Full Family Communication: None present; I spoke with the patient at bedside. Disposition Plan:  The patient is from: home             Anticipated d/c is to: home - PT/OT consult is pending.             Anticipated d/c date will depend on clinical response to treatment, but we estimate 24-48 hours.             Patient is currently: acutely ill Consults called: Urology Admission status:  Observation      Subjective: Feels well. No acute concerns.   Physical Exam: Vitals:   04/11/22 1618 04/11/22 1929 04/12/22 0349 04/12/22 0815  BP: 115/71 116/75  117/70 126/74  Pulse: 71 72 68 70  Resp: '18 20 15 18  '$ Temp: (!) 97.5 F (36.4 C) 98.8 F (37.1 C) 98.2 F (36.8 C) 98.3 F (36.8 C)  TempSrc: Oral Oral Oral   SpO2: 100% 100% 98% 100%  Weight:      Height:       Physical Exam Vitals and nursing note reviewed.  Constitutional:      General: He is not in acute distress.    Appearance: He is not diaphoretic.  HENT:     Head: Atraumatic.  Eyes:     Pupils: Pupils are equal, round, and reactive to  light.  Cardiovascular:     Rate and Rhythm: Normal rate and regular rhythm.     Pulses: Normal pulses.     Heart sounds: No murmur heard. Pulmonary:     Effort: Pulmonary effort is normal. No respiratory distress.     Breath sounds: Normal breath sounds. No rales.  Abdominal:     General: Abdomen is flat. There is no distension.     Palpations: Abdomen is soft.     Tenderness: There is no abdominal tenderness. There is no rebound.  Musculoskeletal:     Right lower leg: No edema.     Left lower leg: No edema.  Skin:    General: Skin is warm and dry.     Capillary Refill: Capillary refill takes less than 2 seconds.  Neurological:     Mental Status: He is alert and oriented to person, place, and time. Mental status is at baseline.  Psychiatric:        Mood and Affect: Mood normal.     Data Reviewed:      Latest Ref Rng & Units 04/12/2022    4:16 AM 04/11/2022    5:09 AM 04/10/2022   12:54 PM  CBC  WBC 4.0 - 10.5 K/uL 3.2  4.4  4.0   Hemoglobin 13.0 - 17.0 g/dL 8.4  9.0  10.9   Hematocrit 39.0 - 52.0 % 26.5  28.8  35.1   Platelets 150 - 400 K/uL 157  182  226        Latest Ref Rng & Units 04/11/2022    6:15 PM 04/11/2022    5:09 AM 04/10/2022   12:54 PM  BMP  Glucose 70 - 99 mg/dL 93  106  95   BUN 8 - 23 mg/dL 85  84  87   Creatinine 0.61 - 1.24 mg/dL 4.68  4.71  4.72   Sodium 135 - 145 mmol/L 137  138  138   Potassium 3.5 - 5.1 mmol/L 4.6  5.2  4.5   Chloride 98 - 111 mmol/L 108  113  108   CO2 22 - 32 mmol/L '15  19  18   '$ Calcium 8.9 - 10.3 mg/dL 10.0  10.0  11.1    BC NGTD Pr/Cr 1.12 UA with bacteria, cultre pending Cortisol 15.2  MRI 01/2022: 1. Negative internal auditory canal imaging. No retrocochlear lesion. 2. 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  CT head: No acute intracranial findings are seen in noncontrast CT brain. Atrophy. 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  Renal  US: 5.2 cm heterogeneous solid mass seen in left kidney concerning for renal cell carcinoma. Further evaluation with MRI or CT scan is recommended.   Increased echogenicity of renal parenchyma is noted bilaterally suggesting medical renal disease.  MRI abd: 1. Heterogeneous  mass of the posterior lip of the right kidney extending into the renal sinus, measuring 5.3 x 4.3 cm. 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 2. No evidence of lymphadenopathy, obvious renal vein invasion, or obvious abdominal metastatic disease on noncontrast examination.  Family Communication: d/w wife at bedside  Disposition: Status is: Observation The patient will require care spanning > 2 midnights and should be moved to inpatient because: severity of illness  Planned Discharge Destination: Home    Time spent: 45 minutes  Author: George Hugh, MD 04/12/2022 3:01 PM  For on call review [email or web address].

## 2022-04-12 NOTE — Plan of Care (Signed)

## 2022-04-12 NOTE — Progress Notes (Signed)
Pt wife at bedside asking if pt can have nephrectomy procedure done inpatient while in hospital rather than outpatient and requesting to speak to MD. George Hugh, MD made aware and on-call urology Claudia Desanctis, MD paged.

## 2022-04-12 NOTE — Progress Notes (Signed)
[facility]  ROUNDING NOTE   Subjective:   The patient denies any shortness of breath.  No complaints this morning  Resting comfortably in bed.   Objective:  Vital signs in last 24 hours:  Temp:  [97.5 F (36.4 C)-98.8 F (37.1 C)] 98.3 F (36.8 C) (09/03 0815) Pulse Rate:  [68-72] 70 (09/03 0815) Resp:  [15-20] 18 (09/03 0815) BP: (115-126)/(70-75) 126/74 (09/03 0815) SpO2:  [98 %-100 %] 100 % (09/03 0815)  Weight change:  Filed Weights   04/10/22 1224  Weight: 79.4 kg    Intake/Output: I/O last 3 completed shifts: In: 3243.8 [P.O.:780; I.V.:2263.8; IV Piggyback:200] Out: 1245 [Urine:1825]   Intake/Output this shift:  No intake/output data recorded.  Physical Exam: General: NAD,   Head: Normocephalic, atraumatic. Moist oral mucosal membranes  Eyes: Anicteric, PERRL  Neck: Supple, trachea midline  Lungs:  Clear to auscultation  Heart: Regular rate and rhythm  Abdomen:  Soft, nontender,   Extremities:  no peripheral edema.  Neurologic: Nonfocal, moving all four extremities  Skin: No lesions  Access: none    Basic Metabolic Panel: Recent Labs  Lab 04/10/22 1254 04/11/22 0509 04/11/22 1815  NA 138 138 137  K 4.5 5.2* 4.6  CL 108 113* 108  CO2 18* 19* 15*  GLUCOSE 95 106* 93  BUN 87* 84* 85*  CREATININE 4.72* 4.71* 4.68*  CALCIUM 11.1* 10.0 10.0    Liver Function Tests: Recent Labs  Lab 04/10/22 1254  AST 32  ALT 22  ALKPHOS 94  BILITOT 1.1  PROT 8.7*  ALBUMIN 3.3*   No results for input(s): "LIPASE", "AMYLASE" in the last 168 hours. No results for input(s): "AMMONIA" in the last 168 hours.  CBC: Recent Labs  Lab 04/10/22 1254 04/11/22 0509 04/12/22 0416  WBC 4.0 4.4 3.2*  HGB 10.9* 9.0* 8.4*  HCT 35.1* 28.8* 26.5*  MCV 88.2 87.0 86.6  PLT 226 182 157    Cardiac Enzymes: Recent Labs  Lab 04/10/22 2035  CKTOTAL 104    BNP: Invalid input(s): "POCBNP"  CBG: No results for input(s): "GLUCAP" in the last 168  hours.  Microbiology: Results for orders placed or performed during the hospital encounter of 04/10/22  Resp Panel by RT-PCR (Flu A&B, Covid) Anterior Nasal Swab     Status: None   Collection Time: 04/10/22 12:54 PM   Specimen: Anterior Nasal Swab  Result Value Ref Range Status   SARS Coronavirus 2 by RT PCR NEGATIVE NEGATIVE Final    Comment: (NOTE) SARS-CoV-2 target nucleic acids are NOT DETECTED.  The SARS-CoV-2 RNA is generally detectable in upper respiratory specimens during the acute phase of infection. The lowest concentration of SARS-CoV-2 viral copies this assay can detect is 138 copies/mL. A negative result does not preclude SARS-Cov-2 infection and should not be used as the sole basis for treatment or other patient management decisions. A negative result may occur with  improper specimen collection/handling, submission of specimen other than nasopharyngeal swab, presence of viral mutation(s) within the areas targeted by this assay, and inadequate number of viral copies(<138 copies/mL). A negative result must be combined with clinical observations, patient history, and epidemiological information. The expected result is Negative.  Fact Sheet for Patients:  EntrepreneurPulse.com.au  Fact Sheet for Healthcare Providers:  IncredibleEmployment.be  This test is no t yet approved or cleared by the [place] FDA and  has been authorized for detection and/or diagnosis of SARS-CoV-2 by FDA under an Emergency Use Authorization (EUA). This EUA will remain  in effect (meaning this test can be used) for the duration of the COVID-19 declaration under Section 564(b)(1) of the Act, 21 U.S.C.section 360bbb-3(b)(1), unless the authorization is terminated  or revoked sooner.       Influenza A by PCR NEGATIVE NEGATIVE Final   Influenza B by PCR NEGATIVE NEGATIVE Final    Comment: (NOTE) The Xpert Xpress SARS-CoV-2/FLU/RSV plus assay is intended  as an aid in the diagnosis of influenza from Nasopharyngeal swab specimens and should not be used as a sole basis for treatment. Nasal washings and aspirates are unacceptable for Xpert Xpress SARS-CoV-2/FLU/RSV testing.  Fact Sheet for Patients: EntrepreneurPulse.com.au  Fact Sheet for Healthcare Providers: IncredibleEmployment.be  This test is not yet approved or cleared by the [place] FDA and has been authorized for detection and/or diagnosis of SARS-CoV-2 by FDA under an Emergency Use Authorization (EUA). This EUA will remain in effect (meaning this test can be used) for the duration of the COVID-19 declaration under Section 564(b)(1) of the Act, 21 U.S.C. section 360bbb-3(b)(1), unless the authorization is terminated or revoked.  Performed at [facility], [address], [place], [place] [address]   Urine Culture     Status: None   Collection Time: 04/10/22  8:30 PM   Specimen: Urine, Random  Result Value Ref Range Status   Specimen Description   Final    URINE, RANDOM Performed at [facility], [address] [place], [place] [address]    Special Requests   Final    NONE Performed at [facility], [address] [place], [place] [address]    Culture   Final    NO GROWTH Performed at [facility], [place] [address], [place], [place] [address]    Report Status 04/12/2022 FINAL  Final  Culture, blood (Routine X 2) w Reflex to ID Panel     Status: None (Preliminary result)   Collection Time: 04/10/22  9:42 PM   Specimen: BLOOD  Result Value Ref Range Status   Specimen Description BLOOD LEFT ASSIST CONTROL  Final   Special Requests   Final    BOTTLES DRAWN AEROBIC AND ANAEROBIC Blood Culture adequate volume   Culture   Final    NO GROWTH 2 DAYS Performed at [facility], [address] [place], [place] [address]    Report Status PENDING  Incomplete  Culture, blood (Routine X 2)  w Reflex to ID Panel     Status: None (Preliminary result)   Collection Time: 04/10/22  9:42 PM   Specimen: BLOOD  Result Value Ref Range Status   Specimen Description BLOOD LEFT HAND  Final   Special Requests IN PEDIATRIC BOTTLE Blood Culture adequate volume  Final   Culture   Final    NO GROWTH 2 DAYS Performed at [facility], [address], [place], [place] [address]    Report Status PENDING  Incomplete    Coagulation Studies: No results for input(s): "LABPROT", "INR" in the last 72 hours.  Urinalysis: Recent Labs    04/10/22 1254  COLORURINE YELLOW*  LABSPEC 1.015  PHURINE 5.0  GLUCOSEU NEGATIVE  HGBUR MODERATE*  BILIRUBINUR NEGATIVE  KETONESUR NEGATIVE  PROTEINUR 100*  NITRITE NEGATIVE  LEUKOCYTESUR NEGATIVE      Imaging: ECHOCARDIOGRAM COMPLETE  Result Date: 04/11/2022    ECHOCARDIOGRAM REPORT   Patient Name:   Kyron Schlitt. Date of Exam: 04/11/2022 Medical Rec #:  [id number]  Height:       71.0 in Accession #:    [id number]         Weight:       175.0 lb Date of Birth:  [birth year]          BSA:          1.992 m Patient Age:    71 years           BP:           102/67 mmHg Patient Gender: M                  HR:           66 bpm. Exam Location:  [facility] Procedure: 2D Echo and Intracardiac Opacification Agent Indications:     Stroke I63.9  History:         Patient has no prior history of Echocardiogram examinations.  Sonographer:     Kathlen Brunswick RDCS Referring Phys:  [id number] George Hugh Diagnosing Phys: Yolonda Kida MD  Sonographer Comments: Technically challenging study due to limited acoustic windows, no subcostal window, suboptimal apical window and suboptimal parasternal window. Image acquisition challenging due to patient body habitus. IMPRESSIONS  1. Left ventricular ejection fraction, by estimation, is 55 to 60%. The left ventricle has normal function. The left ventricle has no regional wall motion abnormalities. Left ventricular diastolic  parameters are consistent with Grade I diastolic dysfunction (impaired relaxation).  2. Right ventricular systolic function is normal. The right ventricular size is normal.  3. Left atrial size was mildly dilated.  4. Right atrial size was mildly dilated.  5. The mitral valve is normal in structure. Mild mitral valve regurgitation.  6. The aortic valve is normal in structure. Aortic valve regurgitation is not visualized. FINDINGS  Left Ventricle: Left ventricular ejection fraction, by estimation, is 55 to 60%. The left ventricle has normal function. The left ventricle has no regional wall motion abnormalities. Definity contrast agent was given IV to delineate the left ventricular  endocardial borders. The left ventricular internal cavity size was normal in size. There is no left ventricular hypertrophy. Left ventricular diastolic parameters are consistent with Grade I diastolic dysfunction (impaired relaxation). Right Ventricle: The right ventricular size is normal. No increase in right ventricular wall thickness. Right ventricular systolic function is normal. Left Atrium: Left atrial size was mildly dilated. Right Atrium: Right atrial size was mildly dilated. Pericardium: There is no evidence of pericardial effusion. Mitral Valve: The mitral valve is normal in structure. Mild mitral valve regurgitation. Tricuspid Valve: The tricuspid valve is normal in structure. Tricuspid valve regurgitation is mild. Aortic Valve: The aortic valve is normal in structure. Aortic valve regurgitation is not visualized. Pulmonic Valve: The pulmonic valve was normal in structure. Pulmonic valve regurgitation is not visualized. Aorta: The ascending aorta was not well visualized. IAS/Shunts: No atrial level shunt detected by color flow Doppler.  LEFT VENTRICLE PLAX 2D LVIDd:         4.85 cm LVIDs:         3.43 cm LV PW:         1.04 cm LV IVS:        0.94 cm LVOT diam:     2.30 cm LVOT Area:     4.15 cm  LEFT ATRIUM           Index LA  diam:      4.30 cm 2.16 cm/m LA Vol (A4C): 39.1 ml 19.63 ml/m  AORTA Ao Root diam: 3.50 cm MITRAL VALVE MV Area (PHT): 2.03 cm    SHUNTS MV Decel Time: 373 msec    Systemic Diam: 2.30 cm MV E velocity: 69.00 cm/s MV A velocity: 80.10 cm/s MV E/A ratio:  0.86 Dwayne D Callwood MD Electronically signed by Yolonda Kida MD Signature Date/Time: 04/11/2022/2:42:38 PM    Final    MR ABDOMEN WO CONTRAST  Result Date: 04/10/2022 CLINICAL DATA:  Left renal mass incidentally identified by ultrasound EXAM: MRI ABDOMEN WITHOUT CONTRAST TECHNIQUE: Multiplanar multisequence MR imaging was performed without the administration of intravenous contrast. COMPARISON:  Renal ultrasound, 04/10/2022 FINDINGS: Lower chest: No acute abnormality. Hepatobiliary: No solid liver abnormality is seen. Mildly distended gallbladder. No gallstones, gallbladder wall thickening, or biliary dilatation. Pancreas: Unremarkable. No pancreatic ductal dilatation or surrounding inflammatory changes. Spleen: Normal in size without significant abnormality. Adrenals/Urinary Tract: Adrenal glands are unremarkable. There is a heterogeneous mass of the posterior lip of the right kidney extending into the renal sinus, measuring 5.3 x 4.3 cm (series 4, image 17). Numerous bilateral small, simple and thinly septated benign renal cortical cysts, for which no further follow-up or characterization is required. No hydronephrosis. Stomach/Bowel: Stomach is within normal limits. No evidence of bowel wall thickening, distention, or inflammatory changes. Vascular/Lymphatic: No significant vascular findings are present. No enlarged abdominal lymph nodes. Other: No abdominal wall hernia or abnormality. No ascites. Musculoskeletal: No acute or significant osseous findings. IMPRESSION: 1. Heterogeneous mass of the posterior lip of the right kidney extending into the renal sinus, measuring 5.3 x 4.3 cm. Findings are most consistent with a renal cell carcinoma, although  in general incompletely characterized by noncontrast MR. 2. No evidence of lymphadenopathy, obvious renal vein invasion, or obvious abdominal metastatic disease on noncontrast examination. Electronically Signed   By: Delanna Ahmadi M.D.   On: 04/10/2022 19:03   US Renal  Result Date: 04/10/2022 CLINICAL DATA:  Renal failure. EXAM: RENAL / URINARY TRACT ULTRASOUND COMPLETE COMPARISON:  None Available. FINDINGS: Right Kidney: Renal measurements: 11.2 x 5.4 x 5.4 cm = volume: 172 mL. 2.2 cm simple cyst is noted in lower pole. Increased echogenicity of renal parenchyma is noted suggesting medical renal disease. No mass or hydronephrosis visualized. Left Kidney: Renal measurements: 12.9 x 7.0 x 6.0 cm = volume: 284 mL. Increased echogenicity of renal parenchyma is noted suggesting medical renal disease. 5.2 cm heterogeneous solid mass is noted medially concerning for renal cell carcinoma. 1.5 cm simple cyst is noted in lower pole. No hydronephrosis is noted. Bladder: Appears normal for degree of bladder distention. Other: None. IMPRESSION: 5.2 cm heterogeneous solid mass seen in left kidney concerning for renal cell carcinoma. Further evaluation with MRI or CT scan is recommended. Increased echogenicity of renal parenchyma is noted bilaterally suggesting medical renal disease. Electronically Signed   By: Marijo Conception M.D.   On: 04/10/2022 16:56   CT HEAD WO CONTRAST (5MM)  Result Date: 04/10/2022 CLINICAL DATA:  Altered mental change EXAM: CT HEAD WITHOUT CONTRAST TECHNIQUE: Contiguous axial images were obtained from the base of the skull through the vertex without intravenous contrast. RADIATION DOSE REDUCTION: This exam was performed according to the departmental dose-optimization program which includes automated exposure control, adjustment of the mA and/or kV according to patient size and/or use of iterative reconstruction technique. COMPARISON:  MR brain done on 01/14/2022 FINDINGS: Brain: No acute  intracranial findings are seen. There are no signs of bleeding within the cranium. There are possible old lacunar infarcts in  the cerebellum on both sides. Cortical sulci are prominent. There is no focal edema or mass effect. Vascular: Scattered arterial calcifications are seen. Skull: Unremarkable. Sinuses/Orbits: Unremarkable. Other: None. IMPRESSION: No acute intracranial findings are seen in noncontrast CT brain. Atrophy. 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 Electronically Signed   By: Elmer Picker M.D.   On: 04/10/2022 14:04   DG Chest 2 View  Result Date: 04/10/2022 CLINICAL DATA:  Weakness and cough EXAM: CHEST - 2 VIEW COMPARISON:  None Available. FINDINGS: Normal mediastinum and cardiac silhouette. Normal pulmonary vasculature. No evidence of effusion, infiltrate, or pneumothorax. No acute bony abnormality. Degenerative osteophytosis of the spine. IMPRESSION: No acute cardiopulmonary process. Electronically Signed   By: Suzy Bouchard M.D.   On: 04/10/2022 13:43     Medications:    cefTRIAXone (ROCEPHIN)  IV Stopped (04/11/22 2226)   sodium bicarbonate 150 mEq in dextrose 5 % 1,150 mL infusion 75 mL/hr at 04/12/22 1028    amLODipine  10 mg Oral q morning   atorvastatin  20 mg Oral Daily   Chlorhexidine Gluconate Cloth  6 each Topical Daily   clopidogrel  75 mg Oral Daily   feeding supplement (GLUCERNA SHAKE)  237 mL Oral TID BM   heparin  5,000 Units Subcutaneous Q8H   mirtazapine  7.5 mg Oral QHS   predniSONE  20 mg Oral Q breakfast   acetaminophen **OR** acetaminophen, ondansetron **OR** ondansetron (ZOFRAN) IV, senna-docusate, traZODone  Assessment/ Plan:  [name]. is a 71 y.o.  male who was admitted to [facility] on 04/10/2022 for evaluation of AKI with a history of anemia, HTN, RA.    Acute Kidney injury- likely secondary to poor oral intake with significant weight loss.  Patients baseline creatinine is approximatley 1.14 ( July 2023)  and is elevated to 4.7.  Continue IV hydration at 75 cc/hour and monitor creatinine. Holding losartan. We will hold off on initiating dialysis at this time. Creatinine made minimal improvement overnight.    Left Renal Mass  - 5.2 cm heterogeneous solid mass seen on Ultra sound. Concerning for renal cell carcinoma . Urology has evaluated the patient and they are wanting optimization of the patient from a renal standpoint prior to scheduling nephrectomy.    3. Hypertension- off of losartan at this time. BP 126/74                LOS: 1 Quade Ramirez P Christena Sunderlin 9/3/202311:33 AM

## 2022-04-12 NOTE — Consult Note (Signed)
Hematology/Oncology Consult note [phone] [phone]      Patient Care Team: Danelle Berry, NP as PCP - General (Nurse Practitioner) Earlie Server, MD as Consulting Physician (Hematology and Oncology)   Name of the patient: Michael Hinton  [id number]  1951-03-19   Date of visit: 04/12/22 REASON FOR COSULTATION:  Kidney mass History of presenting illness-  71 y.o. male with PMH listed at below who presents to ER for evaluation of weakness, malaise, poor appetite, decreased oral intake, unintentional weight loss of 15 pounds over the last 2 weeks.  Patient has chronic joint pain secondary to rheumatoid arthritis.  He follows up with rheumatology and is currently on prednisone and Sarilumab, switched from Enbrel 2 to 3 months ago. Patient felt swallowing difficulties, a lot of phlegm.  Not able to eat much.  He tried to stay hydrated Denies hematuria, dysuria, flank pain. In the emergency room, he was found to have acute kidney injury with a creatinine 4.72, increased from his baseline of 1.14.  Patient had a renal ultrasound which showed a 5.2 cm heterogeneous solid mass left kidney concerning for RCC.  Patient was admitted And started on IV fluids supportive care 04/10/2022, chest x-ray showed no acute cardiopulmonary process.  No pleural effusion, infiltrates or pneumothorax.  04/10/2022, MRI abdomen without contrast showed heterogeneous mass of the posterior lip of the right kidney extending into the renal sinus, measuring 5.3 x 4.3 cm.  Consistent with renal cell carcinoma, although in general incompletely characterized by noncontrast MRI.  No evidence of lymphadenopathy, obvious renal vein invasion or obvious abdominal metastatic disease on noncontrast examination.   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 No pertinent surgical history.  Social History   Socioeconomic History   Marital status: Married    Spouse name: Not on file   Number of children: Not on file   Years of education: Not on file   Highest education level: Not on file  Occupational History   Not on file  Tobacco Use   Smoking status: Former    Packs/day: 0.50    Years: 7.00    Total pack years: 3.50    Types: Cigarettes    Quit date: 03/15/1978    Years since quitting: 44.1    Passive exposure: Past   Smokeless tobacco: Never  Vaping Use   Vaping Use: Never used  Substance and Sexual Activity   Alcohol use: Not Currently   Drug use: No   Sexual activity: Not on file  Other Topics Concern   Not on file  Social History Narrative   Not on file   Social Determinants of Health   Financial Resource Strain: Not on file  Food Insecurity: Not on file  Transportation  Needs: Not on file  Physical Activity: Not on file  Stress: Not on file  Social Connections: Not on file  Intimate Partner Violence: Not on file     Family History  Problem Relation Age of Onset   Heart disease Mother    Diabetes Mother    Breast cancer Mother    Kidney disease Father    Diabetes Father    Diabetes Daughter      Current Facility-Administered Medications:    acetaminophen (TYLENOL) tablet 650 mg, 650 mg, Oral, Q6H PRN **OR**  acetaminophen (TYLENOL) suppository 650 mg, 650 mg, Rectal, Q6H PRN, Masoud, Hannah, MD   amLODipine (NORVASC) tablet 10 mg, 10 mg, Oral, q morning, Masoud, Hannah, MD, 10 mg at 04/12/22 0801   atorvastatin (LIPITOR) tablet 20 mg, 20 mg, Oral, Daily, Ulyess Blossom C, MD, 20 mg at 04/12/22 0801   cefTRIAXone (ROCEPHIN) 1 g in sodium chloride 0.9 % 100 mL IVPB, 1 g, Intravenous, Q24H, George Hugh, MD, Stopped at 04/11/22 2226   Chlorhexidine Gluconate Cloth 2 % PADS 6 each, 6 each, Topical, Daily, Lorelei Pont, MD, 6 each at 04/12/22 0802   clopidogrel (PLAVIX) tablet 75 mg, 75 mg, Oral, Daily, Ulyess Blossom C, MD, 75 mg at 04/12/22 0801   feeding supplement (GLUCERNA SHAKE) ([place]) liquid 237 mL, 237 mL, Oral, TID BM, Masoud, Jarrett Soho, MD, 237 mL at 04/10/22 2118   heparin injection 5,000 Units, 5,000 Units, Subcutaneous, Q8H, Masoud, Jarrett Soho, MD, 5,000 Units at 04/12/22 0551   mirtazapine (REMERON) tablet 7.5 mg, 7.5 mg, Oral, QHS, Ulyess Blossom C, MD, 7.5 mg at 04/11/22 2157   ondansetron (ZOFRAN) tablet 4 mg, 4 mg, Oral, Q6H PRN **OR** ondansetron (ZOFRAN) injection 4 mg, 4 mg, Intravenous, Q6H PRN, Masoud, Hannah, MD   predniSONE (DELTASONE) tablet 20 mg, 20 mg, Oral, Q breakfast, Masoud, Hannah, MD, 20 mg at 04/12/22 0802   senna-docusate (Senokot-S) tablet 1 tablet, 1 tablet, Oral, QHS PRN, George Hugh, MD   sodium bicarbonate 150 mEq in dextrose 5 % 1,150 mL infusion, , Intravenous, Continuous, Masoud, Hannah, MD, Last Rate: 75 mL/hr at 04/12/22 1028, New Bag at 04/12/22 1028   traZODone (DESYREL) tablet 50 mg, 50 mg, Oral, QHS PRN, George Hugh, MD  Review of Systems  Constitutional:  Positive for appetite change, fatigue and unexpected weight change.  HENT:   Negative for sore throat.   Eyes:  Negative for icterus.  Respiratory:  Negative for hemoptysis and shortness of breath.   Cardiovascular:  Negative for chest pain.  Gastrointestinal:  Negative for abdominal pain.        Dysphagia  Genitourinary:  Negative for frequency.   Musculoskeletal:  Positive for arthralgias.  Skin:  Negative for rash.  Neurological:  Negative for headaches.  Hematological:  Negative for adenopathy.    Physical exam:  Vitals:   04/11/22 1618 04/11/22 1929 04/12/22 0349 04/12/22 0815  BP: 115/71 116/75 117/70 126/74  Pulse: 71 72 68 70  Resp: 18 20 15 18   Temp: (!) 97.5 F (36.4 C) 98.8 F (37.1 C) 98.2 F (36.8 C) 98.3 F (36.8 C)  TempSrc: Oral Oral Oral   SpO2: 100% 100% 98% 100%  Weight:      Height:       Physical Exam Constitutional:      General: He is not in acute distress.    Appearance: He is not diaphoretic.  HENT:     Head: Normocephalic and atraumatic.     Nose: Nose normal.  Mouth/Throat:     Pharynx: No oropharyngeal exudate.  Eyes:     General: No scleral icterus. Cardiovascular:     Rate and Rhythm: Normal rate.     Heart sounds: No murmur heard. Pulmonary:     Effort: Pulmonary effort is normal. No respiratory distress.  Abdominal:     General: There is no distension.     Palpations: Abdomen is soft.  Musculoskeletal:        General: Deformity present. Normal range of motion.     Cervical back: Normal range of motion and neck supple.     Comments: Joint swelling /deformity due to rheumatoid arthritis  Lymphadenopathy:     Cervical: No cervical adenopathy.  Skin:    General: Skin is warm and dry.     Findings: No erythema.  Neurological:     Mental Status: He is alert and oriented to person, place, and time. Mental status is at baseline.     Cranial Nerves: No cranial nerve deficit.     Motor: No abnormal muscle tone.  Psychiatric:        Mood and Affect: Affect normal.       Laboratory studies     Latest Ref Rng & Units 04/12/2022    4:16 AM 04/11/2022    5:09 AM 04/10/2022   12:54 PM  CBC  WBC 4.0 - 10.5 K/uL 3.2  4.4  4.0   Hemoglobin 13.0 - 17.0 g/dL 8.4  9.0  10.9   Hematocrit 39.0 - 52.0 % 26.5  28.8  35.1   Platelets  150 - 400 K/uL 157  182  226       Latest Ref Rng & Units 04/11/2022    6:15 PM 04/11/2022    5:09 AM 04/10/2022   12:54 PM  CMP  Glucose 70 - 99 mg/dL 93  106  95   BUN 8 - 23 mg/dL 85  84  87   Creatinine 0.61 - 1.24 mg/dL 4.68  4.71  4.72   Sodium 135 - 145 mmol/L 137  138  138   Potassium 3.5 - 5.1 mmol/L 4.6  5.2  4.5   Chloride 98 - 111 mmol/L 108  113  108   CO2 22 - 32 mmol/L 15  19  18    Calcium 8.9 - 10.3 mg/dL 10.0  10.0  11.1   Total Protein 6.5 - 8.1 g/dL   8.7   Total Bilirubin 0.3 - 1.2 mg/dL   1.1   Alkaline Phos 38 - 126 U/L   94   AST 15 - 41 U/L   32   ALT 0 - 44 U/L   22        RADIOGRAPHIC STUDIES: I have personally reviewed the radiological images as listed and agreed with the findings in the report. ECHOCARDIOGRAM COMPLETE  Result Date: 04/11/2022    ECHOCARDIOGRAM REPORT   Patient Name:   Michael Hinton. Date of Exam: 04/11/2022 Medical Rec #:  [id number]          Height:       71.0 in Accession #:    [id number]         Weight:       175.0 lb Date of Birth:  30-Jun-1951          BSA:          1.992 m Patient Age:    75 years           BP:  102/67 mmHg Patient Gender: M                  HR:           66 bpm. Exam Location:  [facility] Procedure: 2D Echo and Intracardiac Opacification Agent Indications:     Stroke I63.9  History:         Patient has no prior history of Echocardiogram examinations.  Sonographer:     Kathlen Brunswick RDCS Referring Phys:  [id number] George Hugh Diagnosing Phys: Yolonda Kida MD  Sonographer Comments: Technically challenging study due to limited acoustic windows, no subcostal window, suboptimal apical window and suboptimal parasternal window. Image acquisition challenging due to patient body habitus. IMPRESSIONS  1. Left ventricular ejection fraction, by estimation, is 55 to 60%. The left ventricle has normal function. The left ventricle has no regional wall motion abnormalities. Left ventricular diastolic parameters are consistent with  Grade I diastolic dysfunction (impaired relaxation).  2. Right ventricular systolic function is normal. The right ventricular size is normal.  3. Left atrial size was mildly dilated.  4. Right atrial size was mildly dilated.  5. The mitral valve is normal in structure. Mild mitral valve regurgitation.  6. The aortic valve is normal in structure. Aortic valve regurgitation is not visualized. FINDINGS  Left Ventricle: Left ventricular ejection fraction, by estimation, is 55 to 60%. The left ventricle has normal function. The left ventricle has no regional wall motion abnormalities. Definity contrast agent was given IV to delineate the left ventricular  endocardial borders. The left ventricular internal cavity size was normal in size. There is no left ventricular hypertrophy. Left ventricular diastolic parameters are consistent with Grade I diastolic dysfunction (impaired relaxation). Right Ventricle: The right ventricular size is normal. No increase in right ventricular wall thickness. Right ventricular systolic function is normal. Left Atrium: Left atrial size was mildly dilated. Right Atrium: Right atrial size was mildly dilated. Pericardium: There is no evidence of pericardial effusion. Mitral Valve: The mitral valve is normal in structure. Mild mitral valve regurgitation. Tricuspid Valve: The tricuspid valve is normal in structure. Tricuspid valve regurgitation is mild. Aortic Valve: The aortic valve is normal in structure. Aortic valve regurgitation is not visualized. Pulmonic Valve: The pulmonic valve was normal in structure. Pulmonic valve regurgitation is not visualized. Aorta: The ascending aorta was not well visualized. IAS/Shunts: No atrial level shunt detected by color flow Doppler.  LEFT VENTRICLE PLAX 2D LVIDd:         4.85 cm LVIDs:         3.43 cm LV PW:         1.04 cm LV IVS:        0.94 cm LVOT diam:     2.30 cm LVOT Area:     4.15 cm  LEFT ATRIUM           Index LA diam:      4.30 cm 2.16 cm/m LA  Vol (A4C): 39.1 ml 19.63 ml/m   AORTA Ao Root diam: 3.50 cm MITRAL VALVE MV Area (PHT): 2.03 cm    SHUNTS MV Decel Time: 373 msec    Systemic Diam: 2.30 cm MV E velocity: 69.00 cm/s MV A velocity: 80.10 cm/s MV E/A ratio:  0.86 Dwayne D Callwood MD Electronically signed by Yolonda Kida MD Signature Date/Time: 04/11/2022/2:42:38 PM    Final    MR ABDOMEN WO CONTRAST  Result Date: 04/10/2022 CLINICAL DATA:  Left renal mass incidentally identified by ultrasound EXAM: MRI  ABDOMEN WITHOUT CONTRAST TECHNIQUE: Multiplanar multisequence MR imaging was performed without the administration of intravenous contrast. COMPARISON:  Renal ultrasound, 04/10/2022 FINDINGS: Lower chest: No acute abnormality. Hepatobiliary: No solid liver abnormality is seen. Mildly distended gallbladder. No gallstones, gallbladder wall thickening, or biliary dilatation. Pancreas: Unremarkable. No pancreatic ductal dilatation or surrounding inflammatory changes. Spleen: Normal in size without significant abnormality. Adrenals/Urinary Tract: Adrenal glands are unremarkable. There is a heterogeneous mass of the posterior lip of the right kidney extending into the renal sinus, measuring 5.3 x 4.3 cm (series 4, image 17). Numerous bilateral small, simple and thinly septated benign renal cortical cysts, for which no further follow-up or characterization is required. No hydronephrosis. Stomach/Bowel: Stomach is within normal limits. No evidence of bowel wall thickening, distention, or inflammatory changes. Vascular/Lymphatic: No significant vascular findings are present. No enlarged abdominal lymph nodes. Other: No abdominal wall hernia or abnormality. No ascites. Musculoskeletal: No acute or significant osseous findings. IMPRESSION: 1. Heterogeneous mass of the posterior lip of the right kidney extending into the renal sinus, measuring 5.3 x 4.3 cm. Findings are most consistent with a renal cell carcinoma, although in general incompletely  characterized by noncontrast MR. 2. No evidence of lymphadenopathy, obvious renal vein invasion, or obvious abdominal metastatic disease on noncontrast examination. Electronically Signed   By: Delanna Ahmadi M.D.   On: 04/10/2022 19:03   US Renal  Result Date: 04/10/2022 CLINICAL DATA:  Renal failure. EXAM: RENAL / URINARY TRACT ULTRASOUND COMPLETE COMPARISON:  None Available. FINDINGS: Right Kidney: Renal measurements: 11.2 x 5.4 x 5.4 cm = volume: 172 mL. 2.2 cm simple cyst is noted in lower pole. Increased echogenicity of renal parenchyma is noted suggesting medical renal disease. No mass or hydronephrosis visualized. Left Kidney: Renal measurements: 12.9 x 7.0 x 6.0 cm = volume: 284 mL. Increased echogenicity of renal parenchyma is noted suggesting medical renal disease. 5.2 cm heterogeneous solid mass is noted medially concerning for renal cell carcinoma. 1.5 cm simple cyst is noted in lower pole. No hydronephrosis is noted. Bladder: Appears normal for degree of bladder distention. Other: None. IMPRESSION: 5.2 cm heterogeneous solid mass seen in left kidney concerning for renal cell carcinoma. Further evaluation with MRI or CT scan is recommended. Increased echogenicity of renal parenchyma is noted bilaterally suggesting medical renal disease. Electronically Signed   By: Marijo Conception M.D.   On: 04/10/2022 16:56   CT HEAD WO CONTRAST (5MM)  Result Date: 04/10/2022 CLINICAL DATA:  Altered mental change EXAM: CT HEAD WITHOUT CONTRAST TECHNIQUE: Contiguous axial images were obtained from the base of the skull through the vertex without intravenous contrast. RADIATION DOSE REDUCTION: This exam was performed according to the departmental dose-optimization program which includes automated exposure control, adjustment of the mA and/or kV according to patient size and/or use of iterative reconstruction technique. COMPARISON:  MR brain done on 01/14/2022 FINDINGS: Brain: No acute intracranial findings are seen.  There are no signs of bleeding within the cranium. There are possible old lacunar infarcts in the cerebellum on both sides. Cortical sulci are prominent. There is no focal edema or mass effect. Vascular: Scattered arterial calcifications are seen. Skull: Unremarkable. Sinuses/Orbits: Unremarkable. Other: None. IMPRESSION: No acute intracranial findings are seen in noncontrast CT brain. Atrophy. 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 Electronically Signed   By: Elmer Picker M.D.   On: 04/10/2022 14:04   DG Chest 2 View  Result Date: 04/10/2022 CLINICAL DATA:  Weakness and cough EXAM: CHEST - 2  VIEW COMPARISON:  None Available. FINDINGS: Normal mediastinum and cardiac silhouette. Normal pulmonary vasculature. No evidence of effusion, infiltrate, or pneumothorax. No acute bony abnormality. Degenerative osteophytosis of the spine. IMPRESSION: No acute cardiopulmonary process. Electronically Signed   By: Suzy Bouchard M.D.   On: 04/10/2022 13:43   MR BRAIN/IAC W WO CONTRAST  Result Date: 01/15/2022 CLINICAL DATA:  Left-sided sensorineural hearing loss. EXAM: MRI HEAD WITHOUT AND WITH CONTRAST TECHNIQUE: Multiplanar, multiecho pulse sequences of the brain and surrounding structures were obtained without and with intravenous contrast. CONTRAST:  7.43m GADAVIST GADOBUTROL 1 MMOL/ML IV SOLN COMPARISON:  None Available. FINDINGS: Brain: There is no evidence of an acute infarct, intracranial hemorrhage, mass, midline shift, or extra-axial fluid collection. The ventricles and sulci are within normal limits for age. T2 hyperintensities in the cerebral white matter bilaterally are nonspecific but compatible with moderate chronic small vessel ischemic disease. Numerous small chronic infarcts are present in both cerebellar hemispheres. No abnormal enhancement is identified. Dedicated imaging through the internal auditory canals demonstrates a normal course of cranial nerves VII and VIII  without evidence of a mass or abnormal enhancement. The inner ear structures demonstrate normal signal and morphology bilaterally. No mass is present in the cerebellopontine angles. Vascular: Major arterial flow voids at the skull base are preserved. Skull and upper cervical spine: Unremarkable calvarial bone marrow signal. Degenerative changes in the cervical spine. Sinuses/Orbits: Small mucous retention cysts in the maxillary sinuses. Unremarkable orbits. Clear mastoid air cells. Other: None. IMPRESSION: 1. Negative internal auditory canal imaging. No retrocochlear lesion. 2. 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 Electronically Signed   By: ALogan BoresM.D.   On: 01/15/2022 16:51     Assessment and plan-   #left kidney mass, suspicious for malignancy. Images reviewed by myself.  Left kidney mass.  Urology recommendation reviewed.  I agree that patient need excisional biopsy/radical nephrectomy.  This needs to be done outpatient after patient's kidney function is optimized. Normal LDH.  #Acute kidney failure, secondary to poor oral intake, dysphagia. Continue IV fluid hydration.  Empiric antibiotics for UTI. #Hypercalcemia, PTH is decreased.  PTH RP is pending. Check multiple myeloma panel, light chain ratio.  Less likely due to the acute onset of vitamin.  Will rule out.  #Dysphagia, I will check a chest CT for further evaluation as well as staging. #Acute on chronic anemia, normocytic.  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  Check reticulocyte panel, B12, folate.   Thank you for allowing me to participate in the care of this patient.   ZEarlie Server MD, PhD Hematology Oncology 04/12/2022

## 2022-04-13 ENCOUNTER — Inpatient Hospital Stay: Payer: Medicare PPO

## 2022-04-13 DIAGNOSIS — N2889 Other specified disorders of kidney and ureter: Secondary | ICD-10-CM | POA: Diagnosis not present

## 2022-04-13 LAB — FOLATE: Folate: 5.5 ng/mL — ABNORMAL LOW (ref >5.9)

## 2022-04-13 LAB — BASIC METABOLIC PANEL
Anion gap: 11 (ref 5–15)
BUN: 82 mg/dL — ABNORMAL HIGH (ref 8–23)
CO2: 20 mmol/L — ABNORMAL LOW (ref 22–32)
Calcium: 8.9 mg/dL (ref 8.9–10.3)
Chloride: 104 mmol/L (ref 98–111)
Creatinine, Ser: 3.84 mg/dL — ABNORMAL HIGH (ref 0.61–1.24)
GFR, Estimated: 16 mL/min — ABNORMAL LOW (ref >60)
Glucose, Bld: 174 mg/dL — ABNORMAL HIGH (ref 70–99)
Potassium: 5 mmol/L (ref 3.5–5.1)
Sodium: 135 mmol/L (ref 135–145)

## 2022-04-13 LAB — CBC
HCT: 24.9 % — ABNORMAL LOW (ref 39.0–52.0)
Hemoglobin: 7.9 g/dL — ABNORMAL LOW (ref 13.0–17.0)
MCH: 27.1 pg (ref 26.0–34.0)
MCHC: 31.7 g/dL (ref 30.0–36.0)
MCV: 85.3 fL (ref 80.0–100.0)
Platelets: 157 10*3/uL (ref 150–400)
RBC: 2.92 MIL/uL — ABNORMAL LOW (ref 4.22–5.81)
RDW: 13.5 % (ref 11.5–15.5)
WBC: 4.1 10*3/uL (ref 4.0–10.5)
nRBC: 0 % (ref 0.0–0.2)

## 2022-04-13 LAB — IRON AND TIBC
Iron: 37 ug/dL — ABNORMAL LOW (ref 45–182)
Saturation Ratios: 21 % (ref 17.9–39.5)
TIBC: 174 ug/dL — ABNORMAL LOW (ref 250–450)
UIBC: 137 ug/dL

## 2022-04-13 LAB — VITAMIN B12: Vitamin B-12: 1088 pg/mL — ABNORMAL HIGH (ref 180–914)

## 2022-04-13 MED ORDER — NEPRO/CARBSTEADY PO LIQD
237.0000 mL | Freq: Three times a day (TID) | ORAL | Status: DC
  Administered 2022-04-14 (×2): 237 mL via ORAL

## 2022-04-13 MED ORDER — SODIUM CHLORIDE 0.9 % IV SOLN
INTRAVENOUS | Status: DC
Start: 2022-04-13 — End: 2022-04-14

## 2022-04-13 MED ORDER — FOLIC ACID 1 MG PO TABS
1.0000 mg | ORAL_TABLET | Freq: Every day | ORAL | Status: DC
  Administered 2022-04-13 – 2022-04-14 (×2): 1 mg via ORAL
  Filled 2022-04-13 (×2): qty 1

## 2022-04-13 MED ORDER — ADULT MULTIVITAMIN W/MINERALS CH
1.0000 | ORAL_TABLET | Freq: Every day | ORAL | Status: DC
  Administered 2022-04-14: 1 via ORAL
  Filled 2022-04-13: qty 1

## 2022-04-13 NOTE — Progress Notes (Signed)
Occupational Therapy Evaluation Patient Details Name: Michael Hinton. MRN: [id number] DOB: Jan 08, 1951 Today's Date: 04/13/2022   History of Present Illness Pt is a 71 y.o. male with medical history significant of Hypertension, Hyperlipidemia, multiple mini cerebellar strokes, and RA who presents to the ED with reports of chills, fatigue, weakness, malaise, a reduced appetite and unintentional weight loss of 15 pounds over the last two weeks.  MD assessment includes: AKI with metabolic acidosis, 5.3 cm L renal mass, hypercalcemia, chills, Poor Oral Intake / Insomnia, and physical deconditioning.   Clinical Impression   Pt. Presents with weakness, arthritic changes in the BUEs, limited activity tolerance, and limited functional mobility which limits his  ability to complete basic ADL and IADL functioning.  Pt. resides at home with his wife in a 2 story apartment.  Pt.'s wife provides caregiving for her elderly mother.  Pt. was independent with ADLs, and IADL functioning: including light meal preparation. Pt. was able to drive.  Pt. Requires minA with  set-up for ADLs. Pt. could benefit from OT services for ADL training, A/E training, and pt. education about  joint protection principles, home modification, and DME. Pt. Plans to return home upon discharge with family to assist pt. as needed. Pt. Could benefit from follow-up [place] services upon discharge.      Recommendations for follow up therapy are one component of a multi-disciplinary discharge planning process, led by the attending physician.  Recommendations may be updated based on patient status, additional functional criteria and insurance authorization.   Follow Up Recommendations  Home health OT    Assistance Recommended at Discharge    Patient can return home with the following A lot of help with bathing/dressing/bathroom;Assistance with cooking/housework    Functional Status Assessment  Patient has had a recent decline in their  functional status and demonstrates the ability to make significant improvements in function in a reasonable and predictable amount of time.  Equipment Recommendations       Recommendations for Other Services       Precautions / Restrictions Precautions Precautions: Fall Restrictions Weight Bearing Restrictions: No      Mobility Bed Mobility Overal bed mobility: Modified Independent                  Transfers Overall transfer level: Needs assistance Equipment used: Rolling walker (2 wheels) Transfers: Sit to/from Stand Sit to Stand: Supervision                  Balance                                           ADL either performed or assessed with clinical judgement   ADL                                         General ADL Comments: MinA UE, and LE ADLs     Vision Baseline Vision/History: 1 Wears glasses Patient Visual Report: No change from baseline       Perception     Praxis      Pertinent Vitals/Pain Pain Assessment Pain Assessment: No/denies pain     Hand Dominance     Extremity/Trunk Assessment Upper Extremity Assessment Upper Extremity Assessment: Generalized weakness Hx of Arthritic Changes in the BUEs.  Communication Communication Communication: HOH   Cognition Arousal/Alertness: Awake/alert Behavior During Therapy: WFL for tasks assessed/performed Overall Cognitive Status: Within Functional Limits for tasks assessed                                       General Comments       Exercises     Shoulder Instructions      Home Living Family/patient expects to be discharged to:: Private residence Living Arrangements: Spouse/significant other;Other relatives Available Help at Discharge: Family;Available 24 hours/day Type of Home: House Home Access: Stairs to enter [employer] of Steps: 3 Entrance Stairs-Rails: Right;Left;Can reach both Home Layout:  Two level;Able to live on main level with bedroom/bathroom     Bathroom Shower/Tub: Occupational psychologist: Handicapped height     Home Equipment: Grab bars - toilet;Grab bars - tub/shower          Prior Functioning/Environment Prior Level of Function : Independent/Modified Independent             Mobility Comments: Ind amb community distances without an AD, no fall history ADLs Comments: Ind with ADLs        OT Problem List: Decreased strength;Decreased range of motion      OT Treatment/Interventions: Self-care/ADL training;Therapeutic exercise;Patient/family education;DME and/or AE instruction;Energy conservation    OT Goals(Current goals can be found in the care plan section) Acute Rehab OT Goals Patient Stated Goal: To return home OT Goal Formulation: With patient Time For Goal Achievement: 04/27/22 Potential to Achieve Goals: Good  OT Frequency: Min 2X/week    Co-evaluation              AM-PAC OT "6 Clicks" Daily Activity     Outcome Measure Help from another person eating meals?: None Help from another person taking care of personal grooming?: A Little Help from another person toileting, which includes using toliet, bedpan, or urinal?: A Little Help from another person bathing (including washing, rinsing, drying)?: A Little Help from another person to put on and taking off regular upper body clothing?: A Little Help from another person to put on and taking off regular lower body clothing?: A Little 6 Click Score: 19   End of Session Equipment Utilized During Treatment: Gait belt  Activity Tolerance: Patient tolerated treatment well Patient left: in chair;with call bell/phone within reach;with chair alarm set  OT Visit Diagnosis: Muscle weakness (generalized) (M62.81)                Time: 1020-1046 OT Time Calculation (min): 26 min Charges:  OT General Charges $OT Visit: 1 Visit OT Evaluation $OT Eval Moderate Complexity: 1  Mod  Harrel Carina, MS, OTR/L   Harrel Carina 04/13/2022, 12:36 PM

## 2022-04-13 NOTE — Progress Notes (Addendum)
Initial Nutrition Assessment  DOCUMENTATION CODES:   Non-severe (moderate) malnutrition in context of chronic illness  INTERVENTION:   Nepro Shake po TID, each supplement provides 425 kcal and 19 grams protein  Magic cup TID with meals, each supplement provides 290 kcal and 9 grams of protein  MVI po daily   Liberalize diet   Pt at high refeed risk; recommend monitor potassium, magnesium and phosphorus labs daily until stable  NUTRITION DIAGNOSIS:   Moderate Malnutrition related to chronic illness (RA, suspected RCC) as evidenced by moderate fat depletion, moderate muscle depletion, 12 percent weight loss in 7 months.  GOAL:   Patient will meet greater than or equal to 90% of their needs  MONITOR:   PO intake, Supplement acceptance, Labs, Weight trends, Skin, I & O's  REASON FOR ASSESSMENT:   Malnutrition Screening Tool    ASSESSMENT:   71 y/o male with h/o RA, DDD, IDA and B12 deficiency who is admitted with new renal mass suspected RCC, AKI and UTI.  Met with pt and pt's family in room today. Pt reports poor oral intake for the past 8 days. Wife reports pt with poor oral intake for the past two weeks along with a 15lb weight loss. Pt reports that he has only been eating bites of meals. Pt reports nausea and poor taste pta. Pt reports that he is feeling better today. Pt denies any nausea but reports his appetite is still poor. Pt does not like the hospital food. Pt getting food brought from outside of the hospital. Per RN, pt ate 1/2 of a sandwich and a few fries for lunch today. Pt is ordered for Glucerna but reports that he does not like it. Pt reports difficulty swallowing pta but reports this has resolved. Pt is edentulous but wears dentures. Per chart, pt is down 23lbs(12%) since January and is down 14lbs(7%) since July; this is significant weight loss. Plan is for outpatient nephrectomy. RD discussed with pt the importance of adequate nutrition needed to preserve lean  muscle and to optimize his nutritional status prior to surgery. Recommended for pt to find a supplement that he likes to drink at home and recommended daily MVI. Pt is willing to drink Nepro in hospital. RD will add supplements and MVI to help pt meet his estimated needs. RD will also liberalize pt's diet. Pt is at high refeed risk.   Medications reviewed and include: plavix, folic acid, heparin, remeron, prednisone, NaCl @75ml /hr   Labs reviewed: Na 135 wnl, K 5.0 wnl, BUN 82(H), creat 3.84(H) Iron 37(L), TIBC 174(L), folate 5.5(L), B12 1088(H)- 9/4 Hgb 7.9(L), Hct 24.9(L)  NUTRITION - FOCUSED PHYSICAL EXAM:  Flowsheet Row Most Recent Value  Orbital Region Mild depletion  Upper Arm Region Severe depletion  Thoracic and Lumbar Region Moderate depletion  Buccal Region Mild depletion  Temple Region Moderate depletion  Clavicle Bone Region Severe depletion  Clavicle and Acromion Bone Region Severe depletion  Scapular Bone Region Moderate depletion  Dorsal Hand Moderate depletion  Patellar Region Mild depletion  Anterior Thigh Region Mild depletion  Posterior Calf Region Mild depletion  Edema (RD Assessment) None  Hair Reviewed  Eyes Reviewed  Mouth Reviewed  Skin Reviewed  Nails Reviewed   Diet Order:   Diet Order             Diet regular Room service appropriate? Yes; Fluid consistency: Thin  Diet effective now  EDUCATION NEEDS:   Education needs have been addressed  Skin:  Skin Assessment: Reviewed RN Assessment  Last BM:  9/1  Height:   Ht Readings from Last 1 Encounters:  04/10/22 5' 11"  (1.803 m)    Weight:   Wt Readings from Last 1 Encounters:  04/10/22 79.4 kg    Ideal Body Weight:  78.18 kg  BMI:  Body mass index is 24.41 kg/m.  Estimated Nutritional Needs:   Kcal:  2000-2300kcal/day  Protein:  100-115g/day  Fluid:  2.0-2.3L/day  Koleen Distance MS, RD, LDN Please refer to [facility] for RD and/or RD on-call/weekend/after  hours pager

## 2022-04-13 NOTE — TOC Initial Note (Addendum)
Transition of Care (TOC) - Initial/Assessment Note    Patient Details  Name: Michael Hinton. MRN: [id number] Date of Birth: Mar 28, 1951  Transition of [facility]) CM/SW Contact:    Candie Chroman, LCSW Phone Number: 04/13/2022, 1:18 PM  Clinical Narrative:  CSW met with patient. No supports at bedside. CSW introduced role and explained that therapy recommendations would be discussed. Patient is not interested in home health at this time. He will contact TOC if he changes his mind prior to discharge and his PCP if he changes his mind after discharge. He is agreeable to DME recommendation for a RW. Will order prior to discharge. No further concerns. CSW encouraged patient to contact CSW as needed. CSW will continue to follow patient for support and facilitate return home once stable. His wife will transport him home at discharge.     3:15 pm: Ordered RW through [employer].             Expected Discharge Plan: Home/Self Care Barriers to Discharge: Continued Medical Work up   Patient Goals and CMS Choice        Expected Discharge Plan and Services Expected Discharge Plan: Home/Self Care     Post Acute Care Choice: Durable Medical Equipment Living arrangements for the past 2 months: Single Family Home                                      Prior Living Arrangements/Services Living arrangements for the past 2 months: Single Family Home Lives with:: Spouse Patient language and need for interpreter reviewed:: Yes Do you feel safe going back to the place where you live?: Yes      Need for Family Participation in Patient Care: Yes (Comment) Care giver support system in place?: Yes (comment)   Criminal Activity/Legal Involvement Pertinent to Current Situation/Hospitalization: No - Comment as needed  Activities of Daily Living Home Assistive Devices/Equipment: Eyeglasses, Gilford Rile (specify type) ADL Screening (condition at time of admission) Patient's cognitive ability adequate to  safely complete daily activities?: Yes Is the patient deaf or have difficulty hearing?: No Does the patient have difficulty seeing, even when wearing glasses/contacts?: No Does the patient have difficulty concentrating, remembering, or making decisions?: No Patient able to express need for assistance with ADLs?: Yes Does the patient have difficulty dressing or bathing?: Yes Independently performs ADLs?: No Communication: Independent Dressing (OT): Needs assistance Is this a change from baseline?: Change from baseline, expected to last >3 days Grooming: Needs assistance Is this a change from baseline?: Change from baseline, expected to last >3 days Feeding: Needs assistance Is this a change from baseline?: Change from baseline, expected to last >3 days Bathing: Needs assistance Is this a change from baseline?: Change from baseline, expected to last >3 days Toileting: Needs assistance Is this a change from baseline?: Change from baseline, expected to last >3days In/Out Bed: Needs assistance Is this a change from baseline?: Change from baseline, expected to last >3 days Walks in Home: Needs assistance Is this a change from baseline?: Change from baseline, expected to last >3 days Does the patient have difficulty walking or climbing stairs?: Yes Weakness of Legs: Both Weakness of Arms/Hands: Both  Permission Sought/Granted                  Emotional Assessment Appearance:: Appears stated age Attitude/Demeanor/Rapport: Engaged Affect (typically observed): Appropriate, Calm, Pleasant Orientation: : Oriented to Self, Oriented  to Place, Oriented to  Time, Oriented to Situation Alcohol / Substance Use: Not Applicable Psych Involvement: No (comment)  Admission diagnosis:  Weakness [R53.1] Renal mass [N28.89] AKI (acute kidney injury) ([place]) [N17.9] Patient Active Problem List   Diagnosis Date Noted   Weakness    AKI (acute kidney injury) ([place])    Normocytic anemia    Dysphagia     Unintentional weight loss    Hypercalcemia    Renal mass 04/10/2022   Vitamin B12 deficiency 11/04/2021   DDD (degenerative disc disease), lumbar 03/11/2018   ANA positive 01/22/2017   Leucopenia 01/22/2017   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), [place] [address] Phone: [phone] Fax: [place] [address] Phone: [phone] Fax: [phone]     Social Determinants of Health (SDOH) Interventions    Readmission Risk Interventions     No data to display

## 2022-04-13 NOTE — Evaluation (Signed)
Physical Therapy Evaluation Patient Details Name: Michael Hinton. MRN: [id number] DOB: 1950/08/13 Today's Date: 04/13/2022  History of Present Illness  Pt is a 71 y.o. male with medical history significant of Hypertension, Hyperlipidemia, multiple mini cerebellar strokes, and RA who presents to the ED with reports of chills, fatigue, weakness, malaise, a reduced appetite and unintentional weight loss of 15 pounds over the last two weeks.  MD assessment includes: AKI with metabolic acidosis, 5.3 cm L renal mass, hypercalcemia, chills, Poor Oral Intake / Insomnia, and physical deconditioning.   Clinical Impression  Pt was pleasant and motivated to participate during the session and put forth good effort throughout. Pt required min extra time and effort with bed mobility and transfers but was generally steady with good control.  Pt ambulated both with a RW and without with pt presenting with improved stability with the RW and reported subjectively feeling more confident with the AD compared to without.  Pt reported no adverse symptoms during the session with SpO2 and HR WNL on room air.  Pt will benefit from HHPT upon discharge to safely address deficits listed in patient problem list for decreased caregiver assistance and eventual return to PLOF.         Recommendations for follow up therapy are one component of a multi-disciplinary discharge planning process, led by the attending physician.  Recommendations may be updated based on patient status, additional functional criteria and insurance authorization.  Follow Up Recommendations Home health PT      Assistance Recommended at Discharge Intermittent Supervision/Assistance  Patient can return home with the following  A little help with walking and/or transfers;Assist for transportation;Help with stairs or ramp for entrance    Equipment Recommendations Rolling walker (2 wheels)  Recommendations for Other Services       Functional Status  Assessment Patient has had a recent decline in their functional status and demonstrates the ability to make significant improvements in function in a reasonable and predictable amount of time.     Precautions / Restrictions Precautions Precautions: Fall Restrictions Weight Bearing Restrictions: No      Mobility  Bed Mobility Overal bed mobility: Modified Independent             General bed mobility comments: Min extra time and effort only    Transfers Overall transfer level: Needs assistance Equipment used: Rolling walker (2 wheels) Transfers: Sit to/from Stand Sit to Stand: Supervision           General transfer comment: Min extra effort to come to standing    Ambulation/Gait Ambulation/Gait assistance: Min guard, Supervision Gait Distance (Feet): 125 Feet x 1 with RW, 50 feet x 1 without AD Assistive device: Rolling walker (2 wheels), None Gait Pattern/deviations: Step-through pattern, Decreased step length - right, Decreased step length - left, Drifts right/left Gait velocity: decreased     General Gait Details: Slow cadence with short B step length but steady with RW; mild drifting left/right without the RW with reduced cadence; pt reported subjectively not feeling as steady without the RW and preferred to use it  Financial trader Rankin (Stroke Patients Only)       Balance Overall balance assessment: Needs assistance Sitting-balance support: Bilateral upper extremity supported, Feet supported Sitting balance-Leahy Scale: Good     Standing balance support: Bilateral upper extremity supported, During functional activity Standing balance-Leahy Scale: Fair  Pertinent Vitals/Pain Pain Assessment Pain Assessment: 0-10 Pain Score: 4  Pain Location: RUE Pain Descriptors / Indicators: Sore Pain Intervention(s): Premedicated before session, Monitored during session,  Repositioned    Home Living Family/patient expects to be discharged to:: Private residence Living Arrangements: Spouse/significant other;Other relatives Available Help at Discharge: Family;Available 24 hours/day Type of Home: House Home Access: Stairs to enter Entrance Stairs-Rails: Right;Left;Can reach both Entrance Stairs-Number of Steps: 3   Home Layout: Two level;Able to live on main level with bedroom/bathroom Home Equipment: Grab bars - toilet;Grab bars - tub/shower      Prior Function Prior Level of Function : Independent/Modified Independent             Mobility Comments: Ind amb community distances without an AD, no fall history ADLs Comments: Ind with ADLs     Hand Dominance        Extremity/Trunk Assessment   Upper Extremity Assessment Upper Extremity Assessment: Generalized weakness    Lower Extremity Assessment Lower Extremity Assessment: Generalized weakness       Communication   Communication: HOH  Cognition Arousal/Alertness: Awake/alert Behavior During Therapy: WFL for tasks assessed/performed Overall Cognitive Status: Within Functional Limits for tasks assessed                                          General Comments      Exercises     Assessment/Plan    PT Assessment Patient needs continued PT services  PT Problem List Decreased strength;Decreased activity tolerance;Decreased balance;Decreased mobility;Decreased knowledge of use of DME       PT Treatment Interventions DME instruction;Gait training;Stair training;Functional mobility training;Therapeutic activities;Therapeutic exercise;Balance training;Patient/family education    PT Goals (Current goals can be found in the Care Plan section)  Acute Rehab PT Goals Patient Stated Goal: To get stronger PT Goal Formulation: With patient Time For Goal Achievement: 04/26/22 Potential to Achieve Goals: Good    Frequency Min 2X/week     Co-evaluation                AM-PAC PT "6 Clicks" Mobility  Outcome Measure Help needed turning from your back to your side while in a flat bed without using bedrails?: None Help needed moving from lying on your back to sitting on the side of a flat bed without using bedrails?: None Help needed moving to and from a bed to a chair (including a wheelchair)?: A Little Help needed standing up from a chair using your arms (e.g., wheelchair or bedside chair)?: A Little Help needed to walk in hospital room?: A Little Help needed climbing 3-5 steps with a railing? : A Little 6 Click Score: 20    End of Session Equipment Utilized During Treatment: Gait belt Activity Tolerance: Patient tolerated treatment well Patient left: in chair;with call bell/phone within reach;with chair alarm set Nurse Communication: Mobility status PT Visit Diagnosis: Difficulty in walking, not elsewhere classified (R26.2);Muscle weakness (generalized) (M62.81)    Time: 2947-6546 PT Time Calculation (min) (ACUTE ONLY): 34 min   Charges:   PT Evaluation $PT Eval Moderate Complexity: 1 Mod PT Treatments $Gait Training: 8-22 mins        D. Scott Tomeka Kantner PT, DPT 04/13/22, 12:01 PM

## 2022-04-13 NOTE — Progress Notes (Addendum)
PROGRESS NOTE  Michael Hinton.  BUL:[id number] DOB: 11/03/1950 DOA: 04/10/2022 PCP: Danelle Berry, NP   Brief Narrative: Patient is a 71 year old male with history of hypertension, hyperlipidemia, ischemic stroke was, rheumatoid arthritis who presented to the emergency department here with poor oral intake, unintentional weight loss over 2 weeks.  On presentation ,lab work showed creatinine of 4.72(baseline of 1.1).  Imaging also showed 5.2 cm left renal mass.  Nephrology, urology, oncology consulted.  Currently managing AKI with IV fluids.  Outpatient follow-up has been recommended for renal mass.  Assessment & Plan:  Principal Problem:   Renal mass Active Problems:   Weakness   AKI (acute kidney injury) (HCC)   Normocytic anemia   Dysphagia   Unintentional weight loss   Hypercalcemia  AKI: Baseline creatinine around normal, last creatinine of 1.14.  Presented with creatinine in the range of 4.7.  Started on IV fluids.  Renal function slowly improving.  No plan for dialysis.  Having good urine output.  Nephrology following  Left renal mass: Ultrasound showed 5.2 cm heterogeneous solid mass concerning for renal cell carcinoma.  Urology evaluated the patient and recommend outpatient follow-up.  Might need biopsy/nephrectomy as an outpatient but needs to have improvement in the kidney function first.  Oncology team also communicated.  Hypercalcemia: PTH is low, PTHrP is pending.  Calcium level has normalized.  Hypertension: Currently BP stable.  Losartan stopped.  On amlodipine.  History of rheumatoid arthritis: On sarilumab, prednisone.  Will recommend outpatient follow-up with rheumatology.  Complains of severe right shoulder pain so dose of prednisone increased to 20 mg daily which will be tapered ultimately to his home dose 5 mg daily  History of stroke: On Plavix, statin.  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  We will continue cardiology for Holter monitoring near his discharge date  Grade 1 diastolic CHF: Currently euvolemic  Normocytic anemia: Most likely history of malignancy/renal mass.  Vitamin B12 level normal.  Folic acid low, being supplemented.  We will also check iron studies.  No evidence of acute blood loss  Insomnia/poor oral intake: Continue mirtazapine, protein supplements  Hyperlipidemia: Continue statin  Deconditioning/weakness: PT/OT consulted.  Recommend home health         DVT prophylaxis:heparin injection 5,000 Units Start: 04/10/22 2200     Code Status: Full Code  Family Communication: None at the bedside  Patient status:Inpatient  Patient is from :Home  Anticipated discharge HO:[id number]  Estimated DC date: In 1 to 2 days, awaiting further improvement in the renal function.   Consultants: Nephrology, urology  Procedures: None yet  Antimicrobials:  Anti-infectives (From admission, onward)    Start     Dose/Rate Route Frequency Ordered Stop   04/10/22 2200  cefTRIAXone (ROCEPHIN) 1 g in sodium chloride 0.9 % 100 mL IVPB  Status:  Discontinued        1 g 200 mL/hr over 30 Minutes Intravenous Every 24 hours 04/10/22 2116 04/12/22 1756       Subjective: Patient seen and examined at the bedside this morning.  He says he feels better.  Still is weak but says he feels better today.  No new complaints  Objective: Vitals:   04/12/22 1652 04/12/22 1921 04/13/22 0513 04/13/22 0810  BP: 105/66 104/71 104/64 104/61  Pulse: 70 62 (!) 51 (!) 52  Resp: '16 16 16 15  '$ Temp: 98.6 F (37 C) 98.1 F (36.7 C) 98.4 F (  36.9 C) 98.4 F (36.9 C)  TempSrc: Oral Oral Oral Oral  SpO2: 99% 98% 98% 99%  Weight:      Height:        Intake/Output Summary (Last 24 hours) at 04/13/2022 1349 Last data filed at 04/13/2022 1014 Gross per 24 hour  Intake 1270 ml  Output 1975 ml  Net -705 ml   Filed Weights   04/10/22 1224  Weight: 79.4 kg     Examination:  General exam: Overall comfortable, not in distress HEENT: PERRL Respiratory system:  no wheezes or crackles  Cardiovascular system: S1 & S2 heard, RRR.  Gastrointestinal system: Abdomen is nondistended, soft and nontender. Central nervous system: Alert and oriented Extremities: No edema, no clubbing ,no cyanosis Skin: No rashes, no ulcers,no icterus     Data Reviewed: I have personally reviewed following labs and imaging studies  CBC: Recent Labs  Lab 04/10/22 1254 04/11/22 0509 04/12/22 0416 04/13/22 0451  WBC 4.0 4.4 3.2* 4.1  HGB 10.9* 9.0* 8.4* 7.9*  HCT 35.1* 28.8* 26.5* 24.9*  MCV 88.2 87.0 86.6 85.3  PLT 226 182 157 983   Basic Metabolic Panel: Recent Labs  Lab 04/10/22 1254 04/11/22 0509 04/11/22 1815 04/13/22 0451  NA 138 138 137 135  K 4.5 5.2* 4.6 5.0  CL 108 113* 108 104  CO2 18* 19* 15* 20*  GLUCOSE 95 106* 93 174*  BUN 87* 84* 85* 82*  CREATININE 4.72* 4.71* 4.68* 3.84*  CALCIUM 11.1* 10.0 10.0 8.9     Recent Results (from the past 240 hour(s))  Resp Panel by RT-PCR (Flu A&B, Covid) Anterior Nasal Swab     Status: None   Collection Time: 04/10/22 12:54 PM   Specimen: Anterior Nasal Swab  Result Value Ref Range Status   SARS Coronavirus 2 by RT PCR NEGATIVE NEGATIVE Final    Comment: (NOTE) SARS-CoV-2 target nucleic acids are NOT DETECTED.  The SARS-CoV-2 RNA is generally detectable in upper respiratory specimens during the acute phase of infection. The lowest concentration of SARS-CoV-2 viral copies this assay can detect is 138 copies/mL. A negative result does not preclude SARS-Cov-2 infection and should not be used as the sole basis for treatment or other patient management decisions. A negative result may occur with  improper specimen collection/handling, submission of specimen other than nasopharyngeal swab, presence of viral mutation(s) within the areas targeted by this assay, and inadequate number of  viral copies(<138 copies/mL). A negative result must be combined with clinical observations, patient history, and epidemiological information. The expected result is Negative.  Fact Sheet for Patients:  EntrepreneurPulse.com.au  Fact Sheet for Healthcare Providers:  IncredibleEmployment.be  This test is no t yet approved or cleared by the [place] FDA and  has been authorized for detection and/or diagnosis of SARS-CoV-2 by FDA under an Emergency Use Authorization (EUA). This EUA will remain  in effect (meaning this test can be used) for the duration of the COVID-19 declaration under Section 564(b)(1) of the Act, 21 U.S.C.section 360bbb-3(b)(1), unless the authorization is terminated  or revoked sooner.       Influenza A by PCR NEGATIVE NEGATIVE Final   Influenza B by PCR NEGATIVE NEGATIVE Final    Comment: (NOTE) The Xpert Xpress SARS-CoV-2/FLU/RSV plus assay is intended as an aid in the diagnosis of influenza from Nasopharyngeal swab specimens and should not be used as a sole basis for treatment. Nasal washings and aspirates are unacceptable for Xpert Xpress SARS-CoV-2/FLU/RSV testing.  Fact Sheet for Patients: EntrepreneurPulse.com.au  Fact Sheet for Healthcare Providers: IncredibleEmployment.be  This test is not yet approved or cleared by the [place] FDA and has been authorized for detection and/or diagnosis of SARS-CoV-2 by FDA under an Emergency Use Authorization (EUA). This EUA will remain in effect (meaning this test can be used) for the duration of the COVID-19 declaration under Section 564(b)(1) of the Act, 21 U.S.C. section 360bbb-3(b)(1), unless the authorization is terminated or revoked.  Performed at [facility], [address], [place], [place] [address]   Urine Culture     Status: None   Collection Time: 04/10/22  8:30 PM   Specimen: Urine, Random  Result Value  Ref Range Status   Specimen Description   Final    URINE, RANDOM Performed at [facility], [address], [place], [place] [address]    Special Requests   Final    NONE Performed at [facility], [address] [place], [place] [address]    Culture   Final    NO GROWTH Performed at [facility], [place] [address] [place], [place] [address]    Report Status 04/12/2022 FINAL  Final  Culture, blood (Routine X 2) w Reflex to ID Panel     Status: None (Preliminary result)   Collection Time: 04/10/22  9:42 PM   Specimen: BLOOD  Result Value Ref Range Status   Specimen Description BLOOD LEFT ASSIST CONTROL  Final   Special Requests   Final    BOTTLES DRAWN AEROBIC AND ANAEROBIC Blood Culture adequate volume   Culture   Final    NO GROWTH 3 DAYS Performed at [facility], [address], [place], [place] [address]    Report Status PENDING  Incomplete  Culture, blood (Routine X 2) w Reflex to ID Panel     Status: None (Preliminary result)   Collection Time: 04/10/22  9:42 PM   Specimen: BLOOD  Result Value Ref Range Status   Specimen Description BLOOD LEFT HAND  Final   Special Requests IN PEDIATRIC BOTTLE Blood Culture adequate volume  Final   Culture   Final    NO GROWTH 3 DAYS Performed at [facility], [address], [place],  [address]    Report Status PENDING  Incomplete     Radiology Studies: No results found.  Scheduled Meds:  amLODipine  10 mg Oral q morning   atorvastatin  20 mg Oral Daily   Chlorhexidine Gluconate Cloth  6 each Topical Daily   clopidogrel  75 mg Oral Daily   feeding supplement (GLUCERNA SHAKE)  237 mL Oral TID BM   folic acid  1 mg Oral Daily   heparin  5,000 Units Subcutaneous Q8H   mirtazapine  7.5 mg Oral QHS   predniSONE  20 mg Oral Q breakfast   Continuous Infusions:   LOS: 2 days   Shelly Coss, MD [facility] P9/11/2021, 1:49 PM

## 2022-04-13 NOTE — Progress Notes (Signed)
Urology Inpatient Progress Repo        Intv/Subj: No acute events overnight.  Patient is without complaint and feeling better.   Principal Problem:   Renal mass Active Problems:   Weakness   AKI (acute kidney injury) ([place])   Normocytic anemia   Dysphagia   Unintentional weight loss   Hypercalcemia  Current Facility-Administered Medications  Medication Dose Route Frequency Provider Last Rate Last Admin   0.9 %  sodium chloride infusion   Intravenous Continuous Shelly Coss, MD 75 mL/hr at 04/13/22 1438 New Bag at 04/13/22 1438   acetaminophen (TYLENOL) tablet 650 mg  650 mg Oral Q6H PRN George Hugh, MD       Or   acetaminophen (TYLENOL) suppository 650 mg  650 mg Rectal Q6H PRN George Hugh, MD       amLODipine (NORVASC) tablet 10 mg  10 mg Oral q morning George Hugh, MD   10 mg at 04/13/22 1749   atorvastatin (LIPITOR) tablet 20 mg  20 mg Oral Daily Lorelei Pont, MD   20 mg at 04/13/22 4496   Chlorhexidine Gluconate Cloth 2 % PADS 6 each  6 each Topical Daily Lorelei Pont, MD   6 each at 04/13/22 0929   clopidogrel (PLAVIX) tablet 75 mg  75 mg Oral Daily Ulyess Blossom C, MD   75 mg at 04/13/22 7591   feeding supplement (NEPRO CARB STEADY) liquid 237 mL  237 mL Oral TID BM Adhikari, Tamsen Meek, MD       folic acid (FOLVITE) tablet 1 mg  1 mg Oral Daily Shelly Coss, MD   1 mg at 04/13/22 6384   heparin injection 5,000 Units  5,000 Units Subcutaneous Philipp Ovens, MD   5,000 Units at 04/13/22 1437   mirtazapine (REMERON) tablet 7.5 mg  7.5 mg Oral QHS Ulyess Blossom C, MD   7.5 mg at 04/12/22 2215   [START ON 04/14/2022] multivitamin with minerals tablet 1 tablet  1 tablet Oral Daily Shelly Coss, MD       ondansetron (ZOFRAN) tablet 4 mg  4 mg Oral Q6H PRN George Hugh, MD       Or   ondansetron (ZOFRAN) injection 4 mg  4 mg Intravenous Q6H PRN George Hugh, MD       predniSONE (DELTASONE) tablet 20 mg  20 mg Oral Q breakfast George Hugh, MD   20 mg at  04/13/22 0830   senna-docusate (Senokot-S) tablet 1 tablet  1 tablet Oral QHS PRN George Hugh, MD       traZODone (DESYREL) tablet 50 mg  50 mg Oral QHS PRN George Hugh, MD         Objective: Vital: Vitals:   04/12/22 1921 04/13/22 0513 04/13/22 0810 04/13/22 1558  BP: 104/71 104/64 104/61 104/64  Pulse: 62 (!) 51 (!) 52 (!) 57  Resp: '16 16 15 18  '$ Temp: 98.1 F (36.7 C) 98.4 F (36.9 C) 98.4 F (36.9 C) 97.9 F (36.6 C)  TempSrc: Oral Oral Oral Oral  SpO2: 98% 98% 99% 98%  Weight:      Height:       I/Os: I/O last 3 completed shifts: In: 2813.2 [P.O.:360; I.V.:2353.2; IV Piggyback:100] Out: 2850 [Urine:2850]  Physical Exam:  General: Patient is in no apparent distress Lungs: Normal respiratory effort, chest expands symmetrically. GI: abdomen is soft and nontender Foley: draining clear yellow urine  Ext: lower extremities symmetric  Lab Results: Recent Labs    04/11/22 0509 04/12/22 0416 04/13/22  0451  WBC 4.4 3.2* 4.1  HGB 9.0* 8.4* 7.9*  HCT 28.8* 26.5* 24.9*   Recent Labs    04/11/22 0509 04/11/22 1815 04/13/22 0451  NA 138 137 135  K 5.2* 4.6 5.0  CL 113* 108 104  CO2 19* 15* 20*  GLUCOSE 106* 93 174*  BUN 84* 85* 82*  CREATININE 4.71* 4.68* 3.84*  CALCIUM 10.0 10.0 8.9   No results for input(s): "LABPT", "INR" in the last 72 hours. No results for input(s): "LABURIN" in the last 72 hours. Results for orders placed or performed during the hospital encounter of 04/10/22  Resp Panel by RT-PCR (Flu A&B, Covid) Anterior Nasal Swab     Status: None   Collection Time: 04/10/22 12:54 PM   Specimen: Anterior Nasal Swab  Result Value Ref Range Status   SARS Coronavirus 2 by RT PCR NEGATIVE NEGATIVE Final    Comment: (NOTE) SARS-CoV-2 target nucleic acids are NOT DETECTED.  The SARS-CoV-2 RNA is generally detectable in upper respiratory specimens during the acute phase of infection. The lowest concentration of SARS-CoV-2 viral copies this assay  can detect is 138 copies/mL. A negative result does not preclude SARS-Cov-2 infection and should not be used as the sole basis for treatment or other patient management decisions. A negative result may occur with  improper specimen collection/handling, submission of specimen other than nasopharyngeal swab, presence of viral mutation(s) within the areas targeted by this assay, and inadequate number of viral copies(<138 copies/mL). A negative result must be combined with clinical observations, patient history, and epidemiological information. The expected result is Negative.  Fact Sheet for Patients:  EntrepreneurPulse.com.au  Fact Sheet for Healthcare Providers:  IncredibleEmployment.be  This test is no t yet approved or cleared by the [place] FDA and  has been authorized for detection and/or diagnosis of SARS-CoV-2 by FDA under an Emergency Use Authorization (EUA). This EUA will remain  in effect (meaning this test can be used) for the duration of the COVID-19 declaration under Section 564(b)(1) of the Act, 21 U.S.C.section 360bbb-3(b)(1), unless the authorization is terminated  or revoked sooner.       Influenza A by PCR NEGATIVE NEGATIVE Final   Influenza B by PCR NEGATIVE NEGATIVE Final    Comment: (NOTE) The Xpert Xpress SARS-CoV-2/FLU/RSV plus assay is intended as an aid in the diagnosis of influenza from Nasopharyngeal swab specimens and should not be used as a sole basis for treatment. Nasal washings and aspirates are unacceptable for Xpert Xpress SARS-CoV-2/FLU/RSV testing.  Fact Sheet for Patients: EntrepreneurPulse.com.au  Fact Sheet for Healthcare Providers: IncredibleEmployment.be  This test is not yet approved or cleared by the [place] FDA and has been authorized for detection and/or diagnosis of SARS-CoV-2 by FDA under an Emergency Use Authorization (EUA). This EUA will  remain in effect (meaning this test can be used) for the duration of the COVID-19 declaration under Section 564(b)(1) of the Act, 21 U.S.C. section 360bbb-3(b)(1), unless the authorization is terminated or revoked.  Performed at [facility], [address] [place], [place] [address]   Urine Culture     Status: None   Collection Time: 04/10/22  8:30 PM   Specimen: Urine, Random  Result Value Ref Range Status   Specimen Description   Final    URINE, RANDOM Performed at [facility], [address], [place], [place] [address]    Special Requests   Final    NONE Performed at [facility], [place], [place]  [address]    Culture   Final    NO GROWTH Performed at [facility], [place] [address], [place], [place] [address]    Report Status 04/12/2022 FINAL  Final  Culture, blood (Routine X 2) w Reflex to ID Panel     Status: None (Preliminary result)   Collection Time: 04/10/22  9:42 PM   Specimen: BLOOD  Result Value Ref Range Status   Specimen Description BLOOD LEFT ASSIST CONTROL  Final   Special Requests   Final    BOTTLES DRAWN AEROBIC AND ANAEROBIC Blood Culture adequate volume   Culture   Final    NO GROWTH 3 DAYS Performed at [facility], [address], [place], [place] [address]    Report Status PENDING  Incomplete  Culture, blood (Routine X 2) w Reflex to ID Panel     Status: None (Preliminary result)   Collection Time: 04/10/22  9:42 PM   Specimen: BLOOD  Result Value Ref Range Status   Specimen Description BLOOD LEFT HAND  Final   Special Requests IN PEDIATRIC BOTTLE Blood Culture adequate volume  Final   Culture   Final    NO GROWTH 3 DAYS Performed at [facility], [address], [place], [place] [address]    Report Status PENDING  Incomplete      Assessment/Plan: Renal mass: -patient wife was at bedside today and works at [employer] in [place] -discussed that renal  function is improving and we'd like to see this trend back to baseline prior to nephrectomy -he has urology apt scheduled for 9/13 for elevated PSA and recommended he keep that apt to discuss renal mass as well   Jacalyn Lefevre, MD Urology 04/13/2022, 5:14 PM

## 2022-04-13 NOTE — Progress Notes (Signed)
[facility]  ROUNDING NOTE   Subjective:   Renal function appears to be improving. Creatinine down to 3.8. Urine output 1.9 L over the preceding 24 hours.  Objective:  Vital signs in last 24 hours:  Temp:  [98.1 F (36.7 C)-98.6 F (37 C)] 98.4 F (36.9 C) (09/04 0810) Pulse Rate:  [51-70] 52 (09/04 0810) Resp:  [15-16] 15 (09/04 0810) BP: (104-105)/(61-71) 104/61 (09/04 0810) SpO2:  [98 %-99 %] 99 % (09/04 0810)  Weight change:  Filed Weights   04/10/22 1224  Weight: 79.4 kg    Intake/Output: I/O last 3 completed shifts: In: 2813.2 [P.O.:360; I.V.:2353.2; IV Piggyback:100] Out: 2850 [Urine:2850]   Intake/Output this shift:  No intake/output data recorded.  Physical Exam: General: NAD  Head: Normocephalic, atraumatic. Moist oral mucosal membranes  Eyes: Anicteric  Neck: Supple, trachea midline  Lungs:  Clear to auscultation  Heart: Regular rate and rhythm  Abdomen:  Soft, nontender,   Extremities: no peripheral edema.  Neurologic: Nonfocal, moving all four extremities  Skin: No lesions  Access: none    Basic Metabolic Panel: Recent Labs  Lab 04/10/22 1254 04/11/22 0509 04/11/22 1815 04/13/22 0451  NA 138 138 137 135  K 4.5 5.2* 4.6 5.0  CL 108 113* 108 104  CO2 18* 19* 15* 20*  GLUCOSE 95 106* 93 174*  BUN 87* 84* 85* 82*  CREATININE 4.72* 4.71* 4.68* 3.84*  CALCIUM 11.1* 10.0 10.0 8.9     Liver Function Tests: Recent Labs  Lab 04/10/22 1254  AST 32  ALT 22  ALKPHOS 94  BILITOT 1.1  PROT 8.7*  ALBUMIN 3.3*    No results for input(s): "LIPASE", "AMYLASE" in the last 168 hours. No results for input(s): "AMMONIA" in the last 168 hours.  CBC: Recent Labs  Lab 04/10/22 1254 04/11/22 0509 04/12/22 0416 04/13/22 0451  WBC 4.0 4.4 3.2* 4.1  HGB 10.9* 9.0* 8.4* 7.9*  HCT 35.1* 28.8* 26.5* 24.9*  MCV 88.2 87.0 86.6 85.3  PLT 226 182 157 157     Cardiac Enzymes: Recent Labs  Lab 04/10/22 2035  CKTOTAL 104      BNP: Invalid input(s): "POCBNP"  CBG: No results for input(s): "GLUCAP" in the last 168 hours.  Microbiology: Results for orders placed or performed during the hospital encounter of 04/10/22  Resp Panel by RT-PCR (Flu A&B, Covid) Anterior Nasal Swab     Status: None   Collection Time: 04/10/22 12:54 PM   Specimen: Anterior Nasal Swab  Result Value Ref Range Status   SARS Coronavirus 2 by RT PCR NEGATIVE NEGATIVE Final    Comment: (NOTE) SARS-CoV-2 target nucleic acids are NOT DETECTED.  The SARS-CoV-2 RNA is generally detectable in upper respiratory specimens during the acute phase of infection. The lowest concentration of SARS-CoV-2 viral copies this assay can detect is 138 copies/mL. A negative result does not preclude SARS-Cov-2 infection and should not be used as the sole basis for treatment or other patient management decisions. A negative result may occur with  improper specimen collection/handling, submission of specimen other than nasopharyngeal swab, presence of viral mutation(s) within the areas targeted by this assay, and inadequate number of viral copies(<138 copies/mL). A negative result must be combined with clinical observations, patient history, and epidemiological information. The expected result is Negative.  Fact Sheet for Patients:  EntrepreneurPulse.com.au  Fact Sheet for Healthcare Providers:  IncredibleEmployment.be  This test is no t yet approved or cleared by the [place] and  has been authorized  for detection and/or diagnosis of SARS-CoV-2 by FDA under an Emergency Use Authorization (EUA). This EUA will remain  in effect (meaning this test can be used) for the duration of the COVID-19 declaration under Section 564(b)(1) of the Act, 21 U.S.C.section 360bbb-3(b)(1), unless the authorization is terminated  or revoked sooner.       Influenza A by PCR NEGATIVE NEGATIVE Final   Influenza B by PCR  NEGATIVE NEGATIVE Final    Comment: (NOTE) The Xpert Xpress SARS-CoV-2/FLU/RSV plus assay is intended as an aid in the diagnosis of influenza from Nasopharyngeal swab specimens and should not be used as a sole basis for treatment. Nasal washings and aspirates are unacceptable for Xpert Xpress SARS-CoV-2/FLU/RSV testing.  Fact Sheet for Patients: EntrepreneurPulse.com.au  Fact Sheet for Healthcare Providers: IncredibleEmployment.be  This test is not yet approved or cleared by the [place] FDA and has been authorized for detection and/or diagnosis of SARS-CoV-2 by FDA under an Emergency Use Authorization (EUA). This EUA will remain in effect (meaning this test can be used) for the duration of the COVID-19 declaration under Section 564(b)(1) of the Act, 21 U.S.C. section 360bbb-3(b)(1), unless the authorization is terminated or revoked.  Performed at [facility], [address] [place], [place] [address]   Urine Culture     Status: None   Collection Time: 04/10/22  8:30 PM   Specimen: Urine, Random  Result Value Ref Range Status   Specimen Description   Final    URINE, RANDOM Performed at [facility], [address] [place], [place] [address]    Special Requests   Final    NONE Performed at [facility], [address] [place], [place] [address]    Culture   Final    NO GROWTH Performed at [facility], [place] [address], [place], [place] [address]    Report Status 04/12/2022 FINAL  Final  Culture, blood (Routine X 2) w Reflex to ID Panel     Status: None (Preliminary result)   Collection Time: 04/10/22  9:42 PM   Specimen: BLOOD  Result Value Ref Range Status   Specimen Description BLOOD LEFT ASSIST CONTROL  Final   Special Requests   Final    BOTTLES DRAWN AEROBIC AND ANAEROBIC Blood Culture adequate volume   Culture   Final    NO GROWTH 3 DAYS Performed at [facility], [address] [place], [place] [address]    Report Status PENDING  Incomplete  Culture, blood (Routine X 2) w Reflex to ID Panel     Status: None (Preliminary result)   Collection Time: 04/10/22  9:42 PM   Specimen: BLOOD  Result Value Ref Range Status   Specimen Description BLOOD LEFT HAND  Final   Special Requests IN PEDIATRIC BOTTLE Blood Culture adequate volume  Final   Culture   Final    NO GROWTH 3 DAYS Performed at [facility], [address] [place], [place] [address]    Report Status PENDING  Incomplete    Coagulation Studies: No results for input(s): "LABPROT", "INR" in the last 72 hours.  Urinalysis: No results for input(s): "COLORURINE", "LABSPEC", "PHURINE", "GLUCOSEU", "HGBUR", "BILIRUBINUR", "KETONESUR", "PROTEINUR", "UROBILINOGEN", "NITRITE", "LEUKOCYTESUR" in the last 72 hours.  Invalid input(s): "APPERANCEUR"     Imaging: No results found.   Medications:      amLODipine  10 mg Oral q morning   atorvastatin  20 mg Oral Daily   Chlorhexidine Gluconate Cloth  6 each  Topical Daily   clopidogrel  75 mg Oral Daily   feeding supplement (GLUCERNA SHAKE)  237 mL Oral TID BM   folic acid  1 mg Oral Daily   heparin  5,000 Units Subcutaneous Q8H   mirtazapine  7.5 mg Oral QHS   predniSONE  20 mg Oral Q breakfast   acetaminophen **OR** acetaminophen, ondansetron **OR** ondansetron (ZOFRAN) IV, senna-docusate, traZODone  Assessment/ Plan:  Michael Hinton. is a 71 y.o.  male who was admitted to [facility] on 04/10/2022 for evaluation of AKI with a history of anemia, HTN, RA.    Acute Kidney injury- likely secondary to poor oral intake with significant weight loss.  Patients baseline creatinine is approximatley 1.14 ( July 2023) and was elevated to 4.7 at admission.  -Renal function appears to be now improving.  Creatinine down to 3.8 with urine output of 1.9 L.  No immediate need for dialysis.   Left Renal Mass  - 5.2 cm heterogeneous solid mass seen on  Ultra sound. Concerning for renal cell carcinoma . Urology has evaluated the patient and they are wanting optimization of the patient from a renal standpoint prior to scheduling nephrectomy.    3. Hypertension-remains off losartan but now on amlodipine.  Continue to monitor blood pressure trend.                LOS: 2 Venus Gilles 9/4/20231:03 PM

## 2022-04-14 ENCOUNTER — Telehealth: Payer: Self-pay | Admitting: *Deleted

## 2022-04-14 DIAGNOSIS — N2889 Other specified disorders of kidney and ureter: Secondary | ICD-10-CM | POA: Diagnosis not present

## 2022-04-14 LAB — BASIC METABOLIC PANEL
Anion gap: 6 (ref 5–15)
BUN: 77 mg/dL — ABNORMAL HIGH (ref 8–23)
CO2: 21 mmol/L — ABNORMAL LOW (ref 22–32)
Calcium: 8.8 mg/dL — ABNORMAL LOW (ref 8.9–10.3)
Chloride: 111 mmol/L (ref 98–111)
Creatinine, Ser: 3.16 mg/dL — ABNORMAL HIGH (ref 0.61–1.24)
GFR, Estimated: 21 mL/min — ABNORMAL LOW (ref >60)
Glucose, Bld: 104 mg/dL — ABNORMAL HIGH (ref 70–99)
Potassium: 4.7 mmol/L (ref 3.5–5.1)
Sodium: 138 mmol/L (ref 135–145)

## 2022-04-14 LAB — PHOSPHORUS: Phosphorus: 3.6 mg/dL (ref 2.5–4.6)

## 2022-04-14 LAB — CALCITRIOL (1,25 DI-OH VIT D): Vit D, 1,25-Dihydroxy: 38.7 pg/mL (ref 24.8–81.5)

## 2022-04-14 LAB — MAGNESIUM: Magnesium: 1.8 mg/dL (ref 1.7–2.4)

## 2022-04-14 MED ORDER — POLYETHYLENE GLYCOL 3350 17 G PO PACK: 17.0000 g | PACK | Freq: Every day | ORAL | 0 refills | Status: DC | PRN

## 2022-04-14 MED ORDER — SENNOSIDES-DOCUSATE SODIUM 8.6-50 MG PO TABS
1.0000 | ORAL_TABLET | Freq: Two times a day (BID) | ORAL | Status: DC
  Administered 2022-04-14: 1 via ORAL
  Filled 2022-04-14: qty 1

## 2022-04-14 MED ORDER — SODIUM BICARBONATE 650 MG PO TABS: 650.0000 mg | ORAL_TABLET | Freq: Two times a day (BID) | ORAL | 0 refills | Status: DC

## 2022-04-14 MED ORDER — FOLIC ACID 1 MG PO TABS: 1.0000 mg | ORAL_TABLET | Freq: Every day | ORAL | 1 refills | Status: DC

## 2022-04-14 MED ORDER — POLYETHYLENE GLYCOL 3350 17 G PO PACK
17.0000 g | PACK | Freq: Every day | ORAL | Status: DC
  Administered 2022-04-14: 17 g via ORAL
  Filled 2022-04-14: qty 1

## 2022-04-14 MED ORDER — SODIUM BICARBONATE 650 MG PO TABS
650.0000 mg | ORAL_TABLET | Freq: Two times a day (BID) | ORAL | Status: DC
  Administered 2022-04-14: 650 mg via ORAL
  Filled 2022-04-14: qty 1

## 2022-04-14 MED ORDER — SODIUM CHLORIDE 0.9 % IV SOLN
510.0000 mg | Freq: Once | INTRAVENOUS | Status: AC
  Administered 2022-04-14: 510 mg via INTRAVENOUS
  Filled 2022-04-14: qty 17

## 2022-04-14 MED ORDER — FERROUS SULFATE 325 (65 FE) MG PO TABS: 325.0000 mg | ORAL_TABLET | Freq: Every day | ORAL | Status: DC

## 2022-04-14 NOTE — Discharge Summary (Signed)
Physician Discharge Summary  Michael Hinton. QQP:[id number] DOB: 06-26-1951 DOA: 04/10/2022  PCP: Danelle Berry, NP  Admit date: 04/10/2022 Discharge date: 04/14/2022  Admitted From: Home Disposition:  Home  Discharge Condition:Stable CODE STATUS:FULL Diet recommendation: Heart Healthy   Brief/Interim Summary: Patient is a 71 year old male with history of hypertension, hyperlipidemia, ischemic stroke was, rheumatoid arthritis who presented to the emergency department here with poor oral intake, unintentional weight loss over 2 weeks.  On presentation ,lab work showed creatinine of 4.72(baseline of 1.1).  Imaging also showed 5.2 cm left renal mass.  Nephrology, urology, oncology consulted.  Kidney function slightly improved with IV fluids.  Outpatient follow-up has been recommended for renal mass.  He will follow-up with urology and nephrology as an outpatient.  Following problems were addressed during his hospitalization:  AKI: Baseline creatinine around normal, last creatinine of 1.14.  Presented with creatinine in the range of 4.7.  Started on IV fluids.  Renal function slightly improved.  No plan for dialysis.  Having good urine output and looks euvolemic.  Nephrology were following and recommended outpatient follow-up   Left renal mass: Ultrasound showed 5.2 cm heterogeneous solid mass concerning for renal cell carcinoma.  Urology evaluated the patient and recommend outpatient follow-up.  Might need biopsy/nephrectomy as an outpatient but needs to have improvement in the kidney function first.  Oncology team also communicated.   Hypercalcemia: PTH is low, PTHrP is pending.  Calcium level has normalized.   Hypertension: Currently BP stable.  Losartan stopped.  On amlodipine.   History of rheumatoid arthritis: On sarilumab, prednisone.  Will recommend outpatient follow-up with rheumatology.  Complained of severe right shoulder pain so dose of prednisone increased to 20 mg daily ,now he  will continue home dose 5 mg daily   History of stroke: On Plavix, statin.     Grade 1 diastolic CHF: Currently euvolemic   Normocytic anemia: Chronic.  Vitamin B12 level normal.  Folic acid low, being supplemented.  Iron level also low, given a dose of IV iron.  He continues to be on oral iron supplementation at home.  He also follows with hematology/oncology for this.  No evidence of acute blood loss   Insomnia/poor oral intake: Continue mirtazapine, protein supplements   Hyperlipidemia: Continue statin   Deconditioning/weakness: PT/OT consulted.  Recommend home health   Discharge Diagnoses:  Principal Problem:   Renal mass Active Problems:   Weakness   AKI (acute kidney injury) ([place])   Normocytic anemia   Dysphagia   Unintentional weight loss   Hypercalcemia    Discharge Instructions  Discharge Instructions     Diet - low sodium heart healthy   Complete by: As directed    Discharge instructions   Complete by: As directed    1)Please take prescribed medications as instructed 2)Follow up with your PCP in a week.  Do a CBC, BMP tests during the follow-up 3)You will be called by nephrology for follow-up appointment 4)You have an appointment with urology on 9/13.Discussion will be  done about the Kidney mass at that visit   Increase activity slowly   Complete by: As directed       Allergies as of 04/14/2022   No Known Allergies      Medication List     STOP taking these medications    losartan 100 MG tablet Commonly known as: COZAAR       TAKE these medications    amLODipine 10 MG tablet Commonly known as: NORVASC Take 10  mg by mouth every morning.   atorvastatin 20 MG tablet Commonly known as: LIPITOR What changed: Another medication with the same name was removed. Continue taking this medication, and follow the directions you see here.   clopidogrel 75 MG tablet Commonly known as: PLAVIX Take 75 mg by mouth daily.   folic acid 1 MG  tablet Commonly known as: FOLVITE Take 1 tablet (1 mg total) by mouth daily. Start taking on: April 15, 2022   IRON PO Take 1 tablet by mouth daily.   Kevzara 200 MG/1.14ML Soaj Generic drug: Sarilumab Inject 200 mg into the skin every 21 ( twenty-one) days. What changed: Another medication with the same name was removed. Continue taking this medication, and follow the directions you see here.   mirtazapine 7.5 MG tablet Commonly known as: REMERON Take 7.5 mg by mouth at bedtime.   polyethylene glycol 17 g packet Commonly known as: MIRALAX / GLYCOLAX Take 17 g by mouth daily as needed.   predniSONE 5 MG tablet Commonly known as: DELTASONE Take 1 tablet (5 mg total) by mouth daily with breakfast.   rosuvastatin 40 MG tablet Commonly known as: CRESTOR Take 40 mg by mouth daily.   sodium bicarbonate 650 MG tablet Take 1 tablet (650 mg total) by mouth 2 (two) times daily.   Vitamin D-3 125 MCG (5000 UT) Tabs Take 1 tablet by mouth daily.               Durable Medical Equipment  (From admission, onward)           Start     Ordered   04/13/22 1416  For home use only DME Walker  Once       Question:  Patient needs a walker to treat with the following condition  Answer:  Balance disorder   04/13/22 1416            Follow-up Information     Danelle Berry, NP. Schedule an appointment as soon as possible for a visit in 1 week(s).   Specialty: Nurse Practitioner Contact information: [place] [address] [phone]                No Known Allergies  Consultations: Urology, nephrology   Procedures/Studies: US Carotid Bilateral  Result Date: 04/14/2022 CLINICAL DATA:  Hypertension and altered mental status. EXAM: BILATERAL CAROTID DUPLEX ULTRASOUND TECHNIQUE: Pearline Cables scale imaging, color Doppler and duplex ultrasound were performed of bilateral carotid and vertebral arteries in the neck. COMPARISON:  None Available.  FINDINGS: Criteria: Quantification of carotid stenosis is based on velocity parameters that correlate the residual internal carotid diameter with NASCET-based stenosis levels, using the diameter of the distal internal carotid lumen as the denominator for stenosis measurement. The following velocity measurements were obtained: RIGHT ICA:  103/19 cm/sec CCA:  099/83 cm/sec SYSTOLIC ICA/CCA RATIO:  1.0 ECA:  61 cm/sec LEFT ICA:  123/26 cm/sec CCA:  382/50 cm/sec SYSTOLIC ICA/CCA RATIO:  1.1 ECA:  135 cm/sec RIGHT CAROTID ARTERY: Mild plaque at the level of the right carotid bulb. No evidence of ICA plaque or stenosis. RIGHT VERTEBRAL ARTERY: Antegrade flow with normal waveform and velocity. LEFT CAROTID ARTERY: Mild plaque at the level of the left carotid bulb and likely extending to the proximal left ICA. Estimated left ICA stenosis is less than 50%. LEFT VERTEBRAL ARTERY: Antegrade flow with normal waveform and velocity. IMPRESSION: Mild plaque at the level of both carotid bulbs and the proximal left ICA. Estimated left ICA  stenosis is less than 50%. No evidence of right ICA stenosis. Electronically Signed   By: Aletta Edouard M.D.   On: 04/14/2022 08:20   CT CHEST WO CONTRAST  Result Date: 04/13/2022 CLINICAL DATA:  Unintended weight loss, renal mass, chest staging * Tracking Code: BO * EXAM: CT CHEST WITHOUT CONTRAST TECHNIQUE: Multidetector CT imaging of the chest was performed following the standard protocol without IV contrast. RADIATION DOSE REDUCTION: This exam was performed according to the departmental dose-optimization program which includes automated exposure control, adjustment of the mA and/or kV according to patient size and/or use of iterative reconstruction technique. COMPARISON:  MR abdomen, 04/10/2022 FINDINGS: Cardiovascular: Aortic atherosclerosis. Normal heart size. No pericardial effusion. Mediastinum/Nodes: No enlarged mediastinal, hilar, or axillary lymph nodes. Thymic remnant in the  anterior mediastinum. Thyroid gland, trachea, and esophagus demonstrate no significant findings. Lungs/Pleura: Dependent bibasilar scarring or atelectasis. No pleural effusion or pneumothorax. Upper Abdomen: No acute abnormality. Partially imaged solid-appearing mass in the superior pole of the left kidney (series 2, image 158). Musculoskeletal: No chest wall abnormality. No acute osseous findings. Disc degenerative disease and bridging osteophytosis of the thoracic spine, consistent with DISH. IMPRESSION: 1. No evidence of lymphadenopathy or metastatic disease in the chest. 2. 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 Aortic Atherosclerosis (ICD10-I70.0). Electronically Signed   By: Delanna Ahmadi M.D.   On: 04/13/2022 21:38   MR ABDOMEN WO CONTRAST  Addendum Date: 04/12/2022   ADDENDUM REPORT: 04/12/2022 14:59 ADDENDUM: Addendum is made to correct inadvertent laterality error in initially issued report. Mass is as described however of the left kidney. Findings discussed with Dr. Claudia Desanctis via epic secure chat. Electronically Signed   By: Delanna Ahmadi M.D.   On: 04/12/2022 14:59   Result Date: 04/12/2022 CLINICAL DATA:  Left renal mass incidentally identified by ultrasound EXAM: MRI ABDOMEN WITHOUT CONTRAST TECHNIQUE: Multiplanar multisequence MR imaging was performed without the administration of intravenous contrast. COMPARISON:  Renal ultrasound, 04/10/2022 FINDINGS: Lower chest: No acute abnormality. Hepatobiliary: No solid liver abnormality is seen. Mildly distended gallbladder. No gallstones, gallbladder wall thickening, or biliary dilatation. Pancreas: Unremarkable. No pancreatic ductal dilatation or surrounding inflammatory changes. Spleen: Normal in size without significant abnormality. Adrenals/Urinary Tract: Adrenal glands are unremarkable. There is a heterogeneous mass of the  posterior lip of the right kidney extending into the renal sinus, measuring 5.3 x 4.3 cm (series 4, image 17). Numerous bilateral small, simple and thinly septated benign renal cortical cysts, for which no further follow-up or characterization is required. No hydronephrosis. Stomach/Bowel: Stomach is within normal limits. No evidence of bowel wall thickening, distention, or inflammatory changes. Vascular/Lymphatic: No significant vascular findings are present. No enlarged abdominal lymph nodes. Other: No abdominal wall hernia or abnormality. No ascites. Musculoskeletal: No acute or significant osseous findings. IMPRESSION: 1. Heterogeneous mass of the posterior lip of the right kidney extending into the renal sinus, measuring 5.3 x 4.3 cm. 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 2. No evidence of lymphadenopathy, obvious renal vein invasion, or obvious abdominal metastatic disease on noncontrast examination. Electronically Signed: By: Delanna Ahmadi M.D. On: 04/10/2022 19:03   ECHOCARDIOGRAM COMPLETE  Result Date: 04/11/2022    ECHOCARDIOGRAM REPORT   Patient Name:   Michael Hinton. Date of Exam: 04/11/2022 Medical Rec #:  [id number]  Height:       71.0 in Accession #:    [id number]         Weight:       175.0 lb Date of Birth:  1950-12-18          BSA:          1.992 m Patient Age:    61 years           BP:           102/67 mmHg Patient Gender: M                  HR:           66 bpm. Exam Location:  [facility] Procedure: 2D Echo and Intracardiac Opacification Agent Indications:     Stroke I63.9  History:         Patient has no prior history of Echocardiogram examinations.  Sonographer:     Kathlen Brunswick RDCS Referring Phys:  [id number] George Hugh Diagnosing Phys: Yolonda Kida MD  Sonographer Comments: Technically challenging study due to limited acoustic windows, no subcostal window, suboptimal apical window and  suboptimal parasternal window. Image acquisition challenging due to patient body habitus. IMPRESSIONS  1. Left ventricular ejection fraction, by estimation, is 55 to 60%. The left ventricle has normal function. The left ventricle has no regional wall motion abnormalities. Left ventricular diastolic parameters are consistent with Grade I diastolic dysfunction (impaired relaxation).  2. Right ventricular systolic function is normal. The right ventricular size is normal.  3. Left atrial size was mildly dilated.  4. Right atrial size was mildly dilated.  5. The mitral valve is normal in structure. Mild mitral valve regurgitation.  6. The aortic valve is normal in structure. Aortic valve regurgitation is not visualized. FINDINGS  Left Ventricle: Left ventricular ejection fraction, by estimation, is 55 to 60%. The left ventricle has normal function. The left ventricle has no regional wall motion abnormalities. Definity contrast agent was given IV to delineate the left ventricular  endocardial borders. The left ventricular internal cavity size was normal in size. There is no left ventricular hypertrophy. Left ventricular diastolic parameters are consistent with Grade I diastolic dysfunction (impaired relaxation). Right Ventricle: The right ventricular size is normal. No increase in right ventricular wall thickness. Right ventricular systolic function is normal. Left Atrium: Left atrial size was mildly dilated. Right Atrium: Right atrial size was mildly dilated. Pericardium: There is no evidence of pericardial effusion. Mitral Valve: The mitral valve is normal in structure. Mild mitral valve regurgitation. Tricuspid Valve: The tricuspid valve is normal in structure. Tricuspid valve regurgitation is mild. Aortic Valve: The aortic valve is normal in structure. Aortic valve regurgitation is not visualized. Pulmonic Valve: The pulmonic valve was normal in structure. Pulmonic valve regurgitation is not visualized. Aorta: The  ascending aorta was not well visualized. IAS/Shunts: No atrial level shunt detected by color flow Doppler.  LEFT VENTRICLE PLAX 2D LVIDd:         4.85 cm LVIDs:         3.43 cm LV PW:         1.04 cm LV IVS:        0.94 cm LVOT diam:     2.30 cm LVOT Area:     4.15 cm  LEFT ATRIUM           Index LA diam:      4.30 cm 2.16 cm/m LA Vol (A4C): 39.1 ml 19.63 ml/m  AORTA Ao Root diam: 3.50 cm MITRAL VALVE MV Area (PHT): 2.03 cm    SHUNTS MV Decel Time: 373 msec    Systemic Diam: 2.30 cm MV E velocity: 69.00 cm/s MV A velocity: 80.10 cm/s MV E/A ratio:  0.86 Dwayne Prince Rome MD Electronically signed by Yolonda Kida MD Signature Date/Time: 04/11/2022/2:42:38 PM    Final    US Renal  Result Date: 04/10/2022 CLINICAL DATA:  Renal failure. EXAM: RENAL / URINARY TRACT ULTRASOUND COMPLETE COMPARISON:  None Available. FINDINGS: Right Kidney: Renal measurements: 11.2 x 5.4 x 5.4 cm = volume: 172 mL. 2.2 cm simple cyst is noted in lower pole. Increased echogenicity of renal parenchyma is noted suggesting medical renal disease. No mass or hydronephrosis visualized. Left Kidney: Renal measurements: 12.9 x 7.0 x 6.0 cm = volume: 284 mL. Increased echogenicity of renal parenchyma is noted suggesting medical renal disease. 5.2 cm heterogeneous solid mass is noted medially concerning for renal cell carcinoma. 1.5 cm simple cyst is noted in lower pole. No hydronephrosis is noted. Bladder: Appears normal for degree of bladder distention. Other: None. IMPRESSION: 5.2 cm heterogeneous solid mass seen in left kidney concerning for renal cell carcinoma. Further evaluation with MRI or CT scan is recommended. Increased echogenicity of renal parenchyma is noted bilaterally suggesting medical renal disease. Electronically Signed   By: Marijo Conception M.D.   On: 04/10/2022 16:56   CT HEAD WO CONTRAST (5MM)  Result Date: 04/10/2022 CLINICAL DATA:  Altered mental change EXAM: CT HEAD WITHOUT CONTRAST TECHNIQUE: Contiguous axial  images were obtained from the base of the skull through the vertex without intravenous contrast. RADIATION DOSE REDUCTION: This exam was performed according to the departmental dose-optimization program which includes automated exposure control, adjustment of the mA and/or kV according to patient size and/or use of iterative reconstruction technique. COMPARISON:  MR brain done on 01/14/2022 FINDINGS: Brain: No acute intracranial findings are seen. There are no signs of bleeding within the cranium. There are possible old lacunar infarcts in the cerebellum on both sides. Cortical sulci are prominent. There is no focal edema or mass effect. Vascular: Scattered arterial calcifications are seen. Skull: Unremarkable. Sinuses/Orbits: Unremarkable. Other: None. IMPRESSION: No acute intracranial findings are seen in noncontrast CT brain. Atrophy. 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 Electronically Signed   By: Elmer Picker M.D.   On: 04/10/2022 14:04   DG Chest 2 View  Result Date: 04/10/2022 CLINICAL DATA:  Weakness and cough EXAM: CHEST - 2 VIEW COMPARISON:  None Available. FINDINGS: Normal mediastinum and cardiac silhouette. Normal pulmonary vasculature. No evidence of effusion, infiltrate, or pneumothorax. No acute bony abnormality. Degenerative osteophytosis of the spine. IMPRESSION: No acute cardiopulmonary process. Electronically Signed   By: Suzy Bouchard M.D.   On: 04/10/2022 13:43      Subjective: Patient seen and examined at the bedside this morning.  Denies any new complaints.  Comfortable.  Hemodynamically stable for discharge.  Discharge Exam: Vitals:   04/14/22 0345 04/14/22 0855  BP: (!) 100/51 119/70  Pulse: (!) 53 (!) 57  Resp: 18 16  Temp: 98 F (36.7 C) 97.8 F (36.6 C)  SpO2: 98% 100%   Vitals:   04/13/22 1558 04/13/22 1944 04/14/22 0345 04/14/22 0855  BP: 104/64 110/67 (!) 100/51 119/70  Pulse: (!) 57 (!) 56 (!) 53 (!) 57  Resp: '18 18 18 16   '$ Temp: 97.9 F (36.6 C) 98.2 F (36.8 C) 98 F (36.7 C) 97.8 F (  36.6 C)  TempSrc: Oral Oral Oral Oral  SpO2: 98% 99% 98% 100%  Weight:      Height:        General: Pt is alert, awake, not in acute distress Cardiovascular: RRR, S1/S2 +, no rubs, no gallops Respiratory: CTA bilaterally, no wheezing, no rhonchi Abdominal: Soft, NT, ND, bowel sounds + Extremities: no edema, no cyanosis    The results of significant diagnostics from this hospitalization (including imaging, microbiology, ancillary and laboratory) are listed below for reference.     Microbiology: Recent Results (from the past 240 hour(s))  Resp Panel by RT-PCR (Flu A&B, Covid) Anterior Nasal Swab     Status: None   Collection Time: 04/10/22 12:54 PM   Specimen: Anterior Nasal Swab  Result Value Ref Range Status   SARS Coronavirus 2 by RT PCR NEGATIVE NEGATIVE Final    Comment: (NOTE) SARS-CoV-2 target nucleic acids are NOT DETECTED.  The SARS-CoV-2 RNA is generally detectable in upper respiratory specimens during the acute phase of infection. The lowest concentration of SARS-CoV-2 viral copies this assay can detect is 138 copies/mL. A negative result does not preclude SARS-Cov-2 infection and should not be used as the sole basis for treatment or other patient management decisions. A negative result may occur with  improper specimen collection/handling, submission of specimen other than nasopharyngeal swab, presence of viral mutation(s) within the areas targeted by this assay, and inadequate number of viral copies(<138 copies/mL). A negative result must be combined with clinical observations, patient history, and epidemiological information. The expected result is Negative.  Fact Sheet for Patients:  EntrepreneurPulse.com.au  Fact Sheet for Healthcare Providers:  IncredibleEmployment.be  This test is no t yet approved or cleared by the [place] FDA and  has been  authorized for detection and/or diagnosis of SARS-CoV-2 by FDA under an Emergency Use Authorization (EUA). This EUA will remain  in effect (meaning this test can be used) for the duration of the COVID-19 declaration under Section 564(b)(1) of the Act, 21 U.S.C.section 360bbb-3(b)(1), unless the authorization is terminated  or revoked sooner.       Influenza A by PCR NEGATIVE NEGATIVE Final   Influenza B by PCR NEGATIVE NEGATIVE Final    Comment: (NOTE) The Xpert Xpress SARS-CoV-2/FLU/RSV plus assay is intended as an aid in the diagnosis of influenza from Nasopharyngeal swab specimens and should not be used as a sole basis for treatment. Nasal washings and aspirates are unacceptable for Xpert Xpress SARS-CoV-2/FLU/RSV testing.  Fact Sheet for Patients: EntrepreneurPulse.com.au  Fact Sheet for Healthcare Providers: IncredibleEmployment.be  This test is not yet approved or cleared by the [place] FDA and has been authorized for detection and/or diagnosis of SARS-CoV-2 by FDA under an Emergency Use Authorization (EUA). This EUA will remain in effect (meaning this test can be used) for the duration of the COVID-19 declaration under Section 564(b)(1) of the Act, 21 U.S.C. section 360bbb-3(b)(1), unless the authorization is terminated or revoked.  Performed at [facility], [address], [place], [place] [address]   Urine Culture     Status: None   Collection Time: 04/10/22  8:30 PM   Specimen: Urine, Random  Result Value Ref Range Status   Specimen Description   Final    URINE, RANDOM Performed at [facility], [address] [place], [place] [address]    Special Requests   Final    NONE Performed at [facility], [address] [place], [place] [address]    Culture  Final    NO GROWTH Performed at [facility], [place] [address], [place], [place] [address]    Report Status 04/12/2022 FINAL   Final  Culture, blood (Routine X 2) w Reflex to ID Panel     Status: None (Preliminary result)   Collection Time: 04/10/22  9:42 PM   Specimen: BLOOD  Result Value Ref Range Status   Specimen Description BLOOD LEFT ASSIST CONTROL  Final   Special Requests   Final    BOTTLES DRAWN AEROBIC AND ANAEROBIC Blood Culture adequate volume   Culture   Final    NO GROWTH 4 DAYS Performed at [facility], [address], [place], [place] [address]    Report Status PENDING  Incomplete  Culture, blood (Routine X 2) w Reflex to ID Panel     Status: None (Preliminary result)   Collection Time: 04/10/22  9:42 PM   Specimen: BLOOD  Result Value Ref Range Status   Specimen Description BLOOD LEFT HAND  Final   Special Requests IN PEDIATRIC BOTTLE Blood Culture adequate volume  Final   Culture   Final    NO GROWTH 4 DAYS Performed at [facility], [address], [place], [place] [address]    Report Status PENDING  Incomplete     Labs: BNP (last 3 results) No results for input(s): "BNP" in the last 8760 hours. Basic Metabolic Panel: Recent Labs  Lab 04/10/22 1254 04/11/22 0509 04/11/22 1815 04/13/22 0451 04/14/22 0458  NA 138 138 137 135 138  K 4.5 5.2* 4.6 5.0 4.7  CL 108 113* 108 104 111  CO2 18* 19* 15* 20* 21*  GLUCOSE 95 106* 93 174* 104*  BUN 87* 84* 85* 82* 77*  CREATININE 4.72* 4.71* 4.68* 3.84* 3.16*  CALCIUM 11.1* 10.0 10.0 8.9 8.8*  MG  --   --   --   --  1.8  PHOS  --   --   --   --  3.6   Liver Function Tests: Recent Labs  Lab 04/10/22 1254  AST 32  ALT 22  ALKPHOS 94  BILITOT 1.1  PROT 8.7*  ALBUMIN 3.3*   No results for input(s): "LIPASE", "AMYLASE" in the last 168 hours. No results for input(s): "AMMONIA" in the last 168 hours. CBC: Recent Labs  Lab 04/10/22 1254 04/11/22 0509 04/12/22 0416 04/13/22 0451  WBC 4.0 4.4 3.2* 4.1  HGB 10.9* 9.0* 8.4* 7.9*  HCT 35.1* 28.8* 26.5* 24.9*  MCV 88.2 87.0 86.6 85.3  PLT 226 182 157 157    Cardiac Enzymes: Recent Labs  Lab 04/10/22 2035  CKTOTAL 104   BNP: Invalid input(s): "POCBNP" CBG: No results for input(s): "GLUCAP" in the last 168 hours. D-Dimer No results for input(s): "DDIMER" in the last 72 hours. Hgb A1c No results for input(s): "HGBA1C" in the last 72 hours. Lipid Profile No results for input(s): "CHOL", "HDL", "LDLCALC", "TRIG", "CHOLHDL", "LDLDIRECT" in the last 72 hours. Thyroid function studies No results for input(s): "TSH", "T4TOTAL", "T3FREE", "THYROIDAB" in the last 72 hours.  Invalid input(s): "FREET3" Anemia work up Recent Labs    04/12/22 0416 04/13/22 0451  VITAMINB12  --  1,088*  FOLATE  --  5.5*  TIBC  --  174*  IRON  --  37*  RETICCTPCT 1.2  --    Urinalysis    Component Value Date/Time   COLORURINE YELLOW (A) 04/10/2022 1254   APPEARANCEUR TURBID (A) 04/10/2022 1254   LABSPEC 1.015 04/10/2022 1254  PHURINE 5.0 04/10/2022 1254   GLUCOSEU NEGATIVE 04/10/2022 1254   HGBUR MODERATE (A) 04/10/2022 1254   BILIRUBINUR NEGATIVE 04/10/2022 1254   KETONESUR NEGATIVE 04/10/2022 1254   PROTEINUR 100 (A) 04/10/2022 1254   NITRITE NEGATIVE 04/10/2022 1254   LEUKOCYTESUR NEGATIVE 04/10/2022 1254   Sepsis Labs Recent Labs  Lab 04/10/22 1254 04/11/22 0509 04/12/22 0416 04/13/22 0451  WBC 4.0 4.4 3.2* 4.1   Microbiology Recent Results (from the past 240 hour(s))  Resp Panel by RT-PCR (Flu A&B, Covid) Anterior Nasal Swab     Status: None   Collection Time: 04/10/22 12:54 PM   Specimen: Anterior Nasal Swab  Result Value Ref Range Status   SARS Coronavirus 2 by RT PCR NEGATIVE NEGATIVE Final    Comment: (NOTE) SARS-CoV-2 target nucleic acids are NOT DETECTED.  The SARS-CoV-2 RNA is generally detectable in upper respiratory specimens during the acute phase of infection. The lowest concentration of SARS-CoV-2 viral copies this assay can detect is 138 copies/mL. A negative result does not preclude SARS-Cov-2 infection and  should not be used as the sole basis for treatment or other patient management decisions. A negative result may occur with  improper specimen collection/handling, submission of specimen other than nasopharyngeal swab, presence of viral mutation(s) within the areas targeted by this assay, and inadequate number of viral copies(<138 copies/mL). A negative result must be combined with clinical observations, patient history, and epidemiological information. The expected result is Negative.  Fact Sheet for Patients:  EntrepreneurPulse.com.au  Fact Sheet for Healthcare Providers:  IncredibleEmployment.be  This test is no t yet approved or cleared by the [place] FDA and  has been authorized for detection and/or diagnosis of SARS-CoV-2 by FDA under an Emergency Use Authorization (EUA). This EUA will remain  in effect (meaning this test can be used) for the duration of the COVID-19 declaration under Section 564(b)(1) of the Act, 21 U.S.C.section 360bbb-3(b)(1), unless the authorization is terminated  or revoked sooner.       Influenza A by PCR NEGATIVE NEGATIVE Final   Influenza B by PCR NEGATIVE NEGATIVE Final    Comment: (NOTE) The Xpert Xpress SARS-CoV-2/FLU/RSV plus assay is intended as an aid in the diagnosis of influenza from Nasopharyngeal swab specimens and should not be used as a sole basis for treatment. Nasal washings and aspirates are unacceptable for Xpert Xpress SARS-CoV-2/FLU/RSV testing.  Fact Sheet for Patients: EntrepreneurPulse.com.au  Fact Sheet for Healthcare Providers: IncredibleEmployment.be  This test is not yet approved or cleared by the [place] FDA and has been authorized for detection and/or diagnosis of SARS-CoV-2 by FDA under an Emergency Use Authorization (EUA). This EUA will remain in effect (meaning this test can be used) for the duration of the COVID-19 declaration  under Section 564(b)(1) of the Act, 21 U.S.C. section 360bbb-3(b)(1), unless the authorization is terminated or revoked.  Performed at [facility], [address] [place], [place] [address]   Urine Culture     Status: None   Collection Time: 04/10/22  8:30 PM   Specimen: Urine, Random  Result Value Ref Range Status   Specimen Description   Final    URINE, RANDOM Performed at [facility], [address], [place], [place] [address]    Special Requests   Final    NONE Performed at [facility], [address] [place], [place] [address]    Culture   Final    NO GROWTH Performed at [facility], [place],  [place] [address]    Report Status 04/12/2022 FINAL  Final  Culture, blood (Routine X 2) w Reflex to ID Panel     Status: None (Preliminary result)   Collection Time: 04/10/22  9:42 PM   Specimen: BLOOD  Result Value Ref Range Status   Specimen Description BLOOD LEFT ASSIST CONTROL  Final   Special Requests   Final    BOTTLES DRAWN AEROBIC AND ANAEROBIC Blood Culture adequate volume   Culture   Final    NO GROWTH 4 DAYS Performed at [facility], [address], [place], [place] [address]    Report Status PENDING  Incomplete  Culture, blood (Routine X 2) w Reflex to ID Panel     Status: None (Preliminary result)   Collection Time: 04/10/22  9:42 PM   Specimen: BLOOD  Result Value Ref Range Status   Specimen Description BLOOD LEFT HAND  Final   Special Requests IN PEDIATRIC BOTTLE Blood Culture adequate volume  Final   Culture   Final    NO GROWTH 4 DAYS Performed at [facility], [address] [place], [place] [address]    Report Status PENDING  Incomplete    Please note: You were cared for by a hospitalist during your hospital stay. Once you are discharged, your primary care physician will handle any further medical issues. Please note that NO REFILLS for any discharge medications will be  authorized once you are discharged, as it is imperative that you return to your primary care physician (or establish a relationship with a primary care physician if you do not have one) for your post hospital discharge needs so that they can reassess your need for medications and monitor your lab values.    Time coordinating discharge: 40 minutes  SIGNED:   Shelly Coss, MD  [facility] 04/14/2022, 2:13 PM Pager [phone]  If 7PM-7AM, please contact night-coverage [email or web address] Password [email or web address]

## 2022-04-14 NOTE — Progress Notes (Signed)
Occupational Therapy Treatment Patient Details Name: Michael Hinton. MRN: [id number] DOB: 1951/07/20 Today's Date: 04/14/2022   History of present illness Pt is a 71 y.o. male with medical history significant of Hypertension, Hyperlipidemia, multiple mini cerebellar strokes, and RA who presents to the ED with reports of chills, fatigue, weakness, malaise, a reduced appetite and unintentional weight loss of 15 pounds over the last two weeks.  MD assessment includes: AKI with metabolic acidosis, 5.3 cm L renal mass, hypercalcemia, chills, Poor Oral Intake / Insomnia, and physical deconditioning.   OT comments  Pt seen for OT tx, agreeable to session. Pt completed bed mobility and ADL transfers with RW with supervision and increased time and effort to perform. Pt required MAX A to don socks while seated EOB which he reports typically needing assist for at home. Pt completed grooming tasks at the sink with supervision for safety, denying SOB throughout. Max HR 91, RR 34 briefly with exertion. Pt required supervision for safety while ambulating in the room with RW. Pt endorsing feeling more steady using the RW for mobility. Pt educated in activity pacing and ECS to support ADL/IADL participation and safety. Pt verbalized understanding. Continues to benefit from skilled OT services. Continue to recommend [place].    Recommendations for follow up therapy are one component of a multi-disciplinary discharge planning process, led by the attending physician.  Recommendations may be updated based on patient status, additional functional criteria and insurance authorization.    Follow Up Recommendations  Home health OT    Assistance Recommended at Discharge PRN  Patient can return home with the following  Assistance with cooking/housework;A lot of help with bathing/dressing/bathroom;A little help with walking and/or transfers;Help with stairs or ramp for entrance   Equipment Recommendations  None recommended  by OT    Recommendations for Other Services      Precautions / Restrictions Precautions Precautions: Fall Restrictions Weight Bearing Restrictions: No       Mobility Bed Mobility Overal bed mobility: Modified Independent             General bed mobility comments: Min extra time and effort only    Transfers Overall transfer level: Needs assistance Equipment used: Rolling walker (2 wheels) Transfers: Sit to/from Stand Sit to Stand: Supervision           General transfer comment: increased time/effort but completes without direct assist     Balance Overall balance assessment: Needs assistance Sitting-balance support: No upper extremity supported, Feet supported Sitting balance-Leahy Scale: Good     Standing balance support: Single extremity supported, No upper extremity supported, During functional activity Standing balance-Leahy Scale: Fair                             ADL either performed or assessed with clinical judgement   ADL                                         General ADL Comments: Pt completed grooming tasks standing at the sink with supervision, MAX A to don socks seated EOB.    Extremity/Trunk Assessment              Vision       Perception     Praxis      Cognition Arousal/Alertness: Awake/alert Behavior During Therapy: WFL for tasks assessed/performed Overall Cognitive Status:  Within Functional Limits for tasks assessed                                          Exercises Other Exercises Other Exercises: Pt educated in activity pacing and ECS to support ADL/IADL participation and safety    Shoulder Instructions       General Comments Max HR 91, RR 34 briefly with exertion    Pertinent Vitals/ Pain       Pain Assessment Pain Assessment: No/denies pain  Home Living                                          Prior Functioning/Environment               Frequency  Min 2X/week        Progress Toward Goals  OT Goals(current goals can now be found in the care plan section)  Progress towards OT goals: Progressing toward goals  Acute Rehab OT Goals Patient Stated Goal: to return home OT Goal Formulation: With patient Time For Goal Achievement: 04/27/22 Potential to Achieve Goals: Good  Plan Discharge plan remains appropriate;Frequency remains appropriate    Co-evaluation                 AM-PAC OT "6 Clicks" Daily Activity     Outcome Measure   Help from another person eating meals?: None Help from another person taking care of personal grooming?: A Little Help from another person toileting, which includes using toliet, bedpan, or urinal?: A Little Help from another person bathing (including washing, rinsing, drying)?: A Little Help from another person to put on and taking off regular upper body clothing?: A Little Help from another person to put on and taking off regular lower body clothing?: A Little 6 Click Score: 19    End of Session Equipment Utilized During Treatment: Rolling walker (2 wheels)  OT Visit Diagnosis: Muscle weakness (generalized) (M62.81)   Activity Tolerance Patient tolerated treatment well   Patient Left in bed;with call bell/phone within reach;with bed alarm set   Nurse Communication          Time: 1062-6948 OT Time Calculation (min): 13 min  Charges: OT General Charges $OT Visit: 1 Visit OT Treatments $Self Care/Home Management : 8-22 mins  Ardeth Perfect., MPH, MS, OTR/L ascom [phone] 04/14/22, 10:33 AM

## 2022-04-14 NOTE — Telephone Encounter (Signed)
-----   Message from [place], PA-C sent at 04/13/2022  7:37 AM EDT ----- Regarding: heart monitor Pt needs 30 day heart monitor for stroke and 6-8 week follow-up.

## 2022-04-14 NOTE — Telephone Encounter (Signed)
The patient is still currently admitted.

## 2022-04-14 NOTE — Progress Notes (Signed)
[facility]  ROUNDING NOTE   Subjective:   Patient seen sitting up in bed, breakfast tray at bedside Alert and oriented Reports poor appetite of chronic concern  Creatinine 3.16 from 3.84. Urine output 1 L recorded over night.  Objective:  Vital signs in last 24 hours:  Temp:  [97.8 F (36.6 C)-98.2 F (36.8 C)] 97.8 F (36.6 C) (09/05 0855) Pulse Rate:  [53-57] 57 (09/05 0855) Resp:  [16-18] 16 (09/05 0855) BP: (100-119)/(51-70) 119/70 (09/05 0855) SpO2:  [98 %-100 %] 100 % (09/05 0855)  Weight change:  Filed Weights   04/10/22 1224  Weight: 79.4 kg    Intake/Output: I/O last 3 completed shifts: In: 462 [P.O.:360; I.V.:510] Out: 2025 [Urine:2025]   Intake/Output this shift:  No intake/output data recorded.  Physical Exam: General: NAD  Head: Normocephalic, atraumatic. Moist oral mucosal membranes  Eyes: Anicteric  Lungs:  Clear to auscultation, normal effort  Heart: Regular rate and rhythm  Abdomen:  Soft, nontender  Extremities: no peripheral edema.  Neurologic: Nonfocal, moving all four extremities  Skin: No lesions  Access: none    Basic Metabolic Panel: Recent Labs  Lab 04/10/22 1254 04/11/22 0509 04/11/22 1815 04/13/22 0451 04/14/22 0458  NA 138 138 137 135 138  K 4.5 5.2* 4.6 5.0 4.7  CL 108 113* 108 104 111  CO2 18* 19* 15* 20* 21*  GLUCOSE 95 106* 93 174* 104*  BUN 87* 84* 85* 82* 77*  CREATININE 4.72* 4.71* 4.68* 3.84* 3.16*  CALCIUM 11.1* 10.0 10.0 8.9 8.8*  MG  --   --   --   --  1.8  PHOS  --   --   --   --  3.6     Liver Function Tests: Recent Labs  Lab 04/10/22 1254  AST 32  ALT 22  ALKPHOS 94  BILITOT 1.1  PROT 8.7*  ALBUMIN 3.3*    No results for input(s): "LIPASE", "AMYLASE" in the last 168 hours. No results for input(s): "AMMONIA" in the last 168 hours.  CBC: Recent Labs  Lab 04/10/22 1254 04/11/22 0509 04/12/22 0416 04/13/22 0451  WBC 4.0 4.4 3.2* 4.1  HGB 10.9* 9.0* 8.4* 7.9*  HCT 35.1*  28.8* 26.5* 24.9*  MCV 88.2 87.0 86.6 85.3  PLT 226 182 157 157     Cardiac Enzymes: Recent Labs  Lab 04/10/22 2035  CKTOTAL 104     BNP: Invalid input(s): "POCBNP"  CBG: No results for input(s): "GLUCAP" in the last 168 hours.  Microbiology: Results for orders placed or performed during the hospital encounter of 04/10/22  Resp Panel by RT-PCR (Flu A&B, Covid) Anterior Nasal Swab     Status: None   Collection Time: 04/10/22 12:54 PM   Specimen: Anterior Nasal Swab  Result Value Ref Range Status   SARS Coronavirus 2 by RT PCR NEGATIVE NEGATIVE Final    Comment: (NOTE) SARS-CoV-2 target nucleic acids are NOT DETECTED.  The SARS-CoV-2 RNA is generally detectable in upper respiratory specimens during the acute phase of infection. The lowest concentration of SARS-CoV-2 viral copies this assay can detect is 138 copies/mL. A negative result does not preclude SARS-Cov-2 infection and should not be used as the sole basis for treatment or other patient management decisions. A negative result may occur with  improper specimen collection/handling, submission of specimen other than nasopharyngeal swab, presence of viral mutation(s) within the areas targeted by this assay, and inadequate number of viral copies(<138 copies/mL). A negative result must be combined with clinical  observations, patient history, and epidemiological information. The expected result is Negative.  Fact Sheet for Patients:  EntrepreneurPulse.com.au  Fact Sheet for Healthcare Providers:  IncredibleEmployment.be  This test is no t yet approved or cleared by the [place] FDA and  has been authorized for detection and/or diagnosis of SARS-CoV-2 by FDA under an Emergency Use Authorization (EUA). This EUA will remain  in effect (meaning this test can be used) for the duration of the COVID-19 declaration under Section 564(b)(1) of the Act, 21 U.S.C.section 360bbb-3(b)(1),  unless the authorization is terminated  or revoked sooner.       Influenza A by PCR NEGATIVE NEGATIVE Final   Influenza B by PCR NEGATIVE NEGATIVE Final    Comment: (NOTE) The Xpert Xpress SARS-CoV-2/FLU/RSV plus assay is intended as an aid in the diagnosis of influenza from Nasopharyngeal swab specimens and should not be used as a sole basis for treatment. Nasal washings and aspirates are unacceptable for Xpert Xpress SARS-CoV-2/FLU/RSV testing.  Fact Sheet for Patients: EntrepreneurPulse.com.au  Fact Sheet for Healthcare Providers: IncredibleEmployment.be  This test is not yet approved or cleared by the [place] FDA and has been authorized for detection and/or diagnosis of SARS-CoV-2 by FDA under an Emergency Use Authorization (EUA). This EUA will remain in effect (meaning this test can be used) for the duration of the COVID-19 declaration under Section 564(b)(1) of the Act, 21 U.S.C. section 360bbb-3(b)(1), unless the authorization is terminated or revoked.  Performed at [facility], [address], [place], [place] [address]   Urine Culture     Status: None   Collection Time: 04/10/22  8:30 PM   Specimen: Urine, Random  Result Value Ref Range Status   Specimen Description   Final    URINE, RANDOM Performed at [facility], [address], [place], [place] [address]    Special Requests   Final    NONE Performed at [facility], [address] [place], [place] [address]    Culture   Final    NO GROWTH Performed at [facility], [place] [address] [place], [place] [address]    Report Status 04/12/2022 FINAL  Final  Culture, blood (Routine X 2) w Reflex to ID Panel     Status: None (Preliminary result)   Collection Time: 04/10/22  9:42 PM   Specimen: BLOOD  Result Value Ref Range Status   Specimen Description BLOOD LEFT ASSIST CONTROL  Final   Special Requests   Final    BOTTLES DRAWN  AEROBIC AND ANAEROBIC Blood Culture adequate volume   Culture   Final    NO GROWTH 4 DAYS Performed at [facility], [address], [place], [place] [address]    Report Status PENDING  Incomplete  Culture, blood (Routine X 2) w Reflex to ID Panel     Status: None (Preliminary result)   Collection Time: 04/10/22  9:42 PM   Specimen: BLOOD  Result Value Ref Range Status   Specimen Description BLOOD LEFT HAND  Final   Special Requests IN PEDIATRIC BOTTLE Blood Culture adequate volume  Final   Culture   Final    NO GROWTH 4 DAYS Performed at [facility], [address], [place], [place] [address]    Report Status PENDING  Incomplete    Coagulation Studies: No results for input(s): "LABPROT", "INR" in the last 72 hours.  Urinalysis: No results for input(s): "COLORURINE", "LABSPEC", "PHURINE", "GLUCOSEU", "HGBUR", "BILIRUBINUR", "KETONESUR", "PROTEINUR", "UROBILINOGEN", "NITRITE", "LEUKOCYTESUR" in the  last 72 hours.  Invalid input(s): "APPERANCEUR"     Imaging: CT CHEST WO CONTRAST  Result Date: 04/13/2022 CLINICAL DATA:  Unintended weight loss, renal mass, chest staging * Tracking Code: BO * EXAM: CT CHEST WITHOUT CONTRAST TECHNIQUE: Multidetector CT imaging of the chest was performed following the standard protocol without IV contrast. RADIATION DOSE REDUCTION: This exam was performed according to the departmental dose-optimization program which includes automated exposure control, adjustment of the mA and/or kV according to patient size and/or use of iterative reconstruction technique. COMPARISON:  MR abdomen, 04/10/2022 FINDINGS: Cardiovascular: Aortic atherosclerosis. Normal heart size. No pericardial effusion. Mediastinum/Nodes: No enlarged mediastinal, hilar, or axillary lymph nodes. Thymic remnant in the anterior mediastinum. Thyroid gland, trachea, and esophagus demonstrate no significant findings. Lungs/Pleura: Dependent bibasilar scarring or atelectasis. No  pleural effusion or pneumothorax. Upper Abdomen: No acute abnormality. Partially imaged solid-appearing mass in the superior pole of the left kidney (series 2, image 158). Musculoskeletal: No chest wall abnormality. No acute osseous findings. Disc degenerative disease and bridging osteophytosis of the thoracic spine, consistent with DISH. IMPRESSION: 1. No evidence of lymphadenopathy or metastatic disease in the chest. 2. 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 Aortic Atherosclerosis (ICD10-I70.0). Electronically Signed   By: Delanna Ahmadi M.D.   On: 04/13/2022 21:38     Medications:    sodium chloride 75 mL/hr at 04/13/22 1438     amLODipine  10 mg Oral q morning   atorvastatin  20 mg Oral Daily   Chlorhexidine Gluconate Cloth  6 each Topical Daily   clopidogrel  75 mg Oral Daily   feeding supplement (NEPRO CARB STEADY)  237 mL Oral TID BM   [START ON 04/15/2022] ferrous sulfate  325 mg Oral Q breakfast   folic acid  1 mg Oral Daily   heparin  5,000 Units Subcutaneous Q8H   mirtazapine  7.5 mg Oral QHS   multivitamin with minerals  1 tablet Oral Daily   polyethylene glycol  17 g Oral Daily   predniSONE  20 mg Oral Q breakfast   senna-docusate  1 tablet Oral BID   sodium bicarbonate  650 mg Oral BID   acetaminophen **OR** acetaminophen, ondansetron **OR** ondansetron (ZOFRAN) IV, traZODone  Assessment/ Plan:  Michael Hinton. is a 71 y.o.  male who was admitted to [facility] on 04/10/2022 for evaluation of AKI with a history of anemia, HTN, RA.    Acute Kidney injury- likely secondary to poor oral intake with significant weight loss.  Patients baseline creatinine is approximatley 1.14 ( July 2023) and was elevated to 4.7 at admission.    Renal function continues to improve, creatinine 3.16 today.  Adequate urine output recorded.  Patient encouraged to increase oral  intake.  We will continue to monitor during this admission.  Patient will need follow-up in our office at discharge.   Left Renal Mass  - 5.2 cm heterogeneous solid mass seen on Ultra sound. Concerning for renal cell carcinoma . Urology following and awaiting optimization of the patient from a renal standpoint prior to scheduling nephrectomy.    3. Hypertension-remains off losartan but now on amlodipine.  Blood pressure stable for this patient.                LOS: [place] 9/5/20232:47 PM

## 2022-04-14 NOTE — Telephone Encounter (Signed)
LMOV to schedule  

## 2022-04-14 NOTE — Care Management Important Message (Signed)
Important Message  Patient Details  Name: Michael Hinton. MRN: [id number] Date of Birth: 1950-11-17   Medicare Important Message Given:  Yes     Dannette Barbara 04/14/2022, 11:58 AM

## 2022-04-15 LAB — CULTURE, BLOOD (ROUTINE X 2)
Culture: NO GROWTH
Culture: NO GROWTH
Special Requests: ADEQUATE
Special Requests: ADEQUATE

## 2022-04-15 LAB — KAPPA/LAMBDA LIGHT CHAINS
Kappa free light chain: 297.9 mg/L — ABNORMAL HIGH (ref 3.3–19.4)
Kappa, lambda light chain ratio: 3.9 — ABNORMAL HIGH (ref 0.26–1.65)
Lambda free light chains: 76.3 mg/L — ABNORMAL HIGH (ref 5.7–26.3)

## 2022-04-15 NOTE — Telephone Encounter (Signed)
Reviewed the patient's chart- he was consulted by Dr. Clayborn Bigness in July 2023 and advised to follow up with [facility] in 2 months.  Discussed with Cadence Furth, PA- if the patient is going to follow up with Dr. Clayborn Bigness, then he can order the patient's monitor.  Attempted to call the patient. No answer- I left a message to please call back.

## 2022-04-16 ENCOUNTER — Other Ambulatory Visit: Payer: Self-pay | Admitting: Rheumatology

## 2022-04-16 DIAGNOSIS — C642 Malignant neoplasm of left kidney, except renal pelvis: Secondary | ICD-10-CM | POA: Diagnosis not present

## 2022-04-16 DIAGNOSIS — N189 Chronic kidney disease, unspecified: Secondary | ICD-10-CM | POA: Diagnosis not present

## 2022-04-16 DIAGNOSIS — N400 Enlarged prostate without lower urinary tract symptoms: Secondary | ICD-10-CM | POA: Diagnosis not present

## 2022-04-16 DIAGNOSIS — I10 Essential (primary) hypertension: Secondary | ICD-10-CM | POA: Diagnosis not present

## 2022-04-16 DIAGNOSIS — M06849 Other specified rheumatoid arthritis, unspecified hand: Secondary | ICD-10-CM | POA: Diagnosis not present

## 2022-04-16 DIAGNOSIS — R5383 Other fatigue: Secondary | ICD-10-CM | POA: Diagnosis not present

## 2022-04-16 MED ORDER — PREDNISONE 5 MG PO TABS: 5.0000 mg | ORAL_TABLET | Freq: Every day | ORAL | 2 refills | Status: DC

## 2022-04-16 NOTE — Telephone Encounter (Signed)
Patient should take the least dose possible of prednisone to manage symptoms.  A higher dose of prednisone will interfere with the healing process of the surgery and also will cause increased risk of infections.

## 2022-04-16 NOTE — Telephone Encounter (Signed)
Patient's wife called the office stating Michael Hinton was admitted on 04/10/22. She states they found a renal mass. She states while he was in the hospital they were giving him '20mg'$  of prednisone. She requests a call back from [place]. She states she would like to have a refill of prednisone sent in before he gets worse.

## 2022-04-16 NOTE — Telephone Encounter (Signed)
Michael Hinton, advised [name] should take the least dose possible of prednisone to manage symptoms.  A higher dose of prednisone will interfere with the healing process of the surgery and also will cause increased risk of infections. She expressed understanding and states he needs a prescription refill.   Next Visit: 05/06/2022  Last Visit: 02/17/2022  Last Fill: 02/17/2022  Dx: Rheumatoid arthritis involving multiple sites with positive rheumatoid factor   Current Dose per office note on 02/17/2022: prednisone 5 mg daily  Okay to refill Prednisone?

## 2022-04-17 ENCOUNTER — Other Ambulatory Visit (HOSPITAL_COMMUNITY): Payer: Self-pay

## 2022-04-17 LAB — MULTIPLE MYELOMA PANEL, SERUM
Albumin SerPl Elph-Mcnc: 2.5 g/dL — ABNORMAL LOW (ref 2.9–4.4)
Albumin/Glob SerPl: 0.7 (ref 0.7–1.7)
Alpha 1: 0.3 g/dL (ref 0.0–0.4)
Alpha2 Glob SerPl Elph-Mcnc: 0.9 g/dL (ref 0.4–1.0)
B-Globulin SerPl Elph-Mcnc: 1.6 g/dL — ABNORMAL HIGH (ref 0.7–1.3)
Gamma Glob SerPl Elph-Mcnc: 0.8 g/dL (ref 0.4–1.8)
Globulin, Total: 3.6 g/dL (ref 2.2–3.9)
IgA: 1310 mg/dL — ABNORMAL HIGH (ref 61–437)
IgG (Immunoglobin G), Serum: 977 mg/dL (ref 603–1613)
IgM (Immunoglobulin M), Srm: 181 mg/dL — ABNORMAL HIGH (ref 20–172)
Total Protein ELP: 6.1 g/dL (ref 6.0–8.5)

## 2022-04-18 LAB — PTH-RELATED PEPTIDE: PTH-related peptide: <2 pmol/L

## 2022-04-21 NOTE — Telephone Encounter (Signed)
Attempted to call patient again, went straight to voicemail. Left message asking that he return call to office.

## 2022-04-22 ENCOUNTER — Encounter: Payer: Self-pay | Admitting: Urology

## 2022-04-22 ENCOUNTER — Ambulatory Visit (INDEPENDENT_AMBULATORY_CARE_PROVIDER_SITE_OTHER): Payer: Medicare PPO | Admitting: Urology

## 2022-04-22 VITALS — BP 122/72 | HR 78 | Ht 71.0 in | Wt 178.0 lb

## 2022-04-22 DIAGNOSIS — N179 Acute kidney failure, unspecified: Secondary | ICD-10-CM

## 2022-04-22 DIAGNOSIS — N2889 Other specified disorders of kidney and ureter: Secondary | ICD-10-CM

## 2022-04-22 DIAGNOSIS — I4891 Unspecified atrial fibrillation: Secondary | ICD-10-CM | POA: Diagnosis not present

## 2022-04-22 DIAGNOSIS — Z87898 Personal history of other specified conditions: Secondary | ICD-10-CM | POA: Diagnosis not present

## 2022-04-22 DIAGNOSIS — R972 Elevated prostate specific antigen [PSA]: Secondary | ICD-10-CM

## 2022-04-22 DIAGNOSIS — I639 Cerebral infarction, unspecified: Secondary | ICD-10-CM | POA: Diagnosis not present

## 2022-04-22 NOTE — Progress Notes (Signed)
04/22/2022 2:55 PM   Michael Bracken Jr. [birth year] [id number]  Referring provider: Danelle Berry, NP [address] [place],  [place] [address]  Chief Complaint  Patient presents with   Elevated PSA    PSA result was 5.9 on 03/27/2022   Mass    HPI: 71 year old male referred for further evaluation of elevated PSA within the interim is found to have an incidental renal mass as well.  Per the referral, use referred for elevated PSA.  Unfortunately, this lab is not included in his referral packet.  We are able to call over to his primary care's office to give [place] a verbal, lab drawn in 8/23 and PSA at the time was 5.9.  In the interim, he was admitted for acute kidney injury with a creatinine up to 4.72 from baseline of 1.14.  Renal ultrasound showed no hydronephrosis but incidental 5 cm left renal mass.  Urinalysis also appeared to be consistent with an infection with 11-20 white blood cells and many bacteria however the urine culture ended up resulting without growth..  At time of discharge, his creatinine had improved back to 3.16 but still markedly elevated.  As part of his additional work-up by Dr. Claudia Desanctis, he underwent an MRI without contrast.  This shows a heterogeneous mass extending from the posterior lip of the right kidney extending to the renal sinus measuring 5.3 x 4.3 centimeters.  Unfortunately, it was not completely diagnostic without contrast.  No obvious lymphadenopathy or renal vein invasion seen.  He also has a personal history of rheumatoid arthritis and history of ischemic stroke on Plavix.Marland Kitchen  PMH: Past Medical History:  Diagnosis Date   Anemia    High risk medication use 06/02/2016   Hypertension 06/02/2016   Neutropenia ([place])    Prediabetes    Rheumatoid arthritis involving multiple sites with positive rheumatoid factor ([place]) 06/02/2016    Surgical History: No past surgical history on file.  Home Medications:  Allergies as of 04/22/2022   No Known  Allergies      Medication List        Accurate as of April 22, 2022 11:59 PM. If you have any questions, ask your nurse or doctor.          STOP taking these medications    atorvastatin 20 MG tablet Commonly known as: LIPITOR Stopped by: Hollice Espy, MD   Kevzara 200 MG/1.14ML [place] Generic drug: Sarilumab Stopped by: Hollice Espy, MD       TAKE these medications    amLODipine 10 MG tablet Commonly known as: NORVASC Take 10 mg by mouth every morning.   clopidogrel 75 MG tablet Commonly known as: PLAVIX Take 75 mg by mouth daily.   folic acid 1 MG tablet Commonly known as: FOLVITE Take 1 tablet (1 mg total) by mouth daily.   IRON PO Take 1 tablet by mouth daily.   mirtazapine 7.5 MG tablet Commonly known as: REMERON Take 7.5 mg by mouth at bedtime.   polyethylene glycol 17 g packet Commonly known as: MIRALAX / GLYCOLAX Take 17 g by mouth daily as needed.   predniSONE 5 MG tablet Commonly known as: DELTASONE Take 1 tablet (5 mg total) by mouth daily with breakfast.   rosuvastatin 40 MG tablet Commonly known as: CRESTOR Take 40 mg by mouth daily.   sodium bicarbonate 650 MG tablet Take 1 tablet (650 mg total) by mouth 2 (two) times daily.   Vitamin D-3 125 MCG (5000 UT) Tabs Take 1 tablet  by mouth daily.        Allergies: No Known Allergies  Family History: Family History  Problem Relation Age of Onset   Heart disease Mother    Diabetes Mother    Breast cancer Mother    Kidney disease Father    Diabetes Father    Diabetes Daughter     Social History:  reports that he quit smoking about 44 years ago. His smoking use included cigarettes. He has a 3.50 pack-year smoking history. He has been exposed to tobacco smoke. He has never used smokeless tobacco. He reports that he does not currently use alcohol. He reports that he does not use drugs.   Physical Exam: BP 122/72   Pulse 78   Ht '5\' 11"'$  (1.803 m)   Wt 178 lb (80.7 kg)    BMI 24.83 kg/m   Constitutional:  Alert and oriented, No acute distress.  Accompanied by his wife today.  She provides much of the history. HEENT: [place] AT, moist mucus membranes.  Trachea midline, no masses. Cardiovascular: No clubbing, cyanosis, or edema. Respiratory: Normal respiratory effort, no increased work of breathing. GI: Abdomen is soft, nontender, nondistended, no abdominal masses Rectal: Normal sphincter tone.  Enlarged, rubbery prostate without nodularity bilaterally. Skin: No rashes, bruises or suspicious lesions. Neurologic: Grossly intact, no focal deficits, moving all 4 extremities. Psychiatric: Normal mood and affect.  Laboratory Data: Lab Results  Component Value Date   WBC 4.1 04/13/2022   HGB 7.9 (L) 04/13/2022   HCT 24.9 (L) 04/13/2022   MCV 85.3 04/13/2022   PLT 157 04/13/2022    Lab Results  Component Value Date   CREATININE 1.23 04/22/2022    Urinalysis    Component Value Date/Time   COLORURINE YELLOW (A) 04/10/2022 1254   APPEARANCEUR TURBID (A) 04/10/2022 1254   LABSPEC 1.015 04/10/2022 1254   PHURINE 5.0 04/10/2022 1254   GLUCOSEU NEGATIVE 04/10/2022 1254   HGBUR MODERATE (A) 04/10/2022 1254   BILIRUBINUR NEGATIVE 04/10/2022 1254   KETONESUR NEGATIVE 04/10/2022 1254   PROTEINUR 100 (A) 04/10/2022 1254   NITRITE NEGATIVE 04/10/2022 1254   LEUKOCYTESUR NEGATIVE 04/10/2022 1254    Lab Results  Component Value Date   BACTERIA MANY (A) 04/10/2022    Pertinent Imaging: IMPRESSION: 1. Heterogeneous mass of the posterior lip of the right kidney extending into the renal sinus, measuring 5.3 x 4.3 cm. 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 2. No evidence of lymphadenopathy, obvious renal vein invasion, or obvious abdominal metastatic disease on noncontrast examination.   Electronically Signed: By: Delanna Ahmadi M.D. On: 04/10/2022 19:03  MRI images were  personally reviewed.  Disagree with radiologic interpretation as above, the laterality is on the left kidney.  This was corrected in an addendum as well to reflect the correct laterality.  Assessment & Plan:    1. History of elevated PSA Isolated elevated PSA just prior to possible UTI, medical illness and hospital admission  Rectal exam today is reassuring, we will plan to repeat his PSA and likely trend it for period of time.  If it continues to trend upwards, would likely recommend prostate biopsy.  They are agreeable with this plan.  If he does need to pursue this, want to have clearance to hold aspirin and Plavix. - PSA; Future - Basic metabolic panel; Future - Basic metabolic panel - PSA  2. AKI (acute kidney injury) ([place]) Profound renal failure in the setting of chronic  NSAID use for rheumatoid arthritis, dehydration, and blood pressure medications  Other than the nephrologist seen in the hospital, he does not have an established nephrologist.  In light of renal mass and probable need for nephrectomy, I do think it would be reasonable to have him go ahead and establish care with this.  We did go ahead and repeat his BMP today. - Ambulatory referral to Nephrology  3. Left renal mass Approximately 5 cm renal mass abutting the renal sinus highly concerning for renal cell carcinoma  We did discuss the differential diagnosis which may include a benign lesion versus an indolent tumor.  He had an extensive evaluation including CT of the chest without any further evidence of metastatic disease, appears to be localized.  Ultimately if this is in fact malignant, will likely need radical nephrectomy.  We did discuss concern for progression to chronic kidney disease and high risk for end-stage renal disease.  Lesion does not appear to be amenable to partial nephrectomy.  We discussed various options, feel most comfortable with consideration of renal mass biopsy to ensure this in fact is  malignant prior to proceeding with nephrectomy.  We discussed risk and benefits.  We will need to get clearance. - Ambulatory referral to Nephrology    Hollice Espy, MD  [facility] [address] [place], [place] [address] [phone]   I spent 65 total minutes on the day of the encounter including pre-visit review of the medical record, face-to-face time with the patient, and post visit ordering of labs/imaging/tests.

## 2022-04-23 ENCOUNTER — Other Ambulatory Visit: Payer: Self-pay | Admitting: Family Medicine

## 2022-04-23 ENCOUNTER — Telehealth: Payer: Self-pay

## 2022-04-23 ENCOUNTER — Telehealth: Payer: Self-pay | Admitting: Family Medicine

## 2022-04-23 DIAGNOSIS — N2889 Other specified disorders of kidney and ureter: Secondary | ICD-10-CM

## 2022-04-23 DIAGNOSIS — Z87898 Personal history of other specified conditions: Secondary | ICD-10-CM

## 2022-04-23 LAB — BASIC METABOLIC PANEL
BUN/Creatinine Ratio: 15 (ref 10–24)
BUN: 18 mg/dL (ref 8–27)
CO2: 20 mmol/L (ref 20–29)
Calcium: 8.7 mg/dL (ref 8.6–10.2)
Chloride: 98 mmol/L (ref 96–106)
Creatinine, Ser: 1.23 mg/dL (ref 0.76–1.27)
Glucose: 102 mg/dL — ABNORMAL HIGH (ref 70–99)
Potassium: 4.1 mmol/L (ref 3.5–5.2)
Sodium: 134 mmol/L (ref 134–144)
eGFR: 64 mL/min/{1.73_m2} (ref >59)

## 2022-04-23 LAB — PSA: Prostate Specific Ag, Serum: 7.2 ng/mL — ABNORMAL HIGH (ref 0.0–4.0)

## 2022-04-23 NOTE — Telephone Encounter (Signed)
Left message for patient to call back to discuss results and recommendations.  Orders placed and appointment scheduled.

## 2022-04-23 NOTE — Telephone Encounter (Signed)
Patient needs a renal biopsy. Order placed, waiting on appointment.

## 2022-04-23 NOTE — Telephone Encounter (Incomplete)
REQUEST FOR CARDIAC CLEARANCE       Date: 04/23/2022  Request Clearance from Dr. Clayborn Bigness  Faxed to: [phone]  Procedure: Renal Biopsy  Date of Procedure: TBD  Provider: Hollice Espy, MD    Cardiac  Clearance : {yes/no:20286}  Reason: ***     Risk Assessment:    Low   '[]'$       Moderate   '[]'$     High   '[]'$           This patient is optimized for surgery  YES '[]'$       NO   '[]'$    I recommend further assessment/workup prior to procedure:    YES '[]'$      NO  '[]'$   Appointment scheduled for: _______________________   Further recommendations: ______________________________    Physician Signature:__________________________________   Printed Name: ________________________________________   Date: _________________

## 2022-04-23 NOTE — Progress Notes (Deleted)
Office Visit Note  Patient: Michael Hinton.             Date of Birth: 24-Oct-1950           MRN: [id number]             PCP: Danelle Berry, NP Referring: Danelle Berry, NP Visit Date: 05/06/2022 Occupation: '@GUAROCC'$ @  Subjective:  No chief complaint on file.   History of Present Illness: Michael Hinton. is a 71 y.o. male ***   Activities of Daily Living:  Patient reports morning stiffness for *** {minute/hour:19697}.   Patient {ACTIONS;DENIES/REPORTS:21021675::"Denies"} nocturnal pain.  Difficulty dressing/grooming: {ACTIONS;DENIES/REPORTS:21021675::"Denies"} Difficulty climbing stairs: {ACTIONS;DENIES/REPORTS:21021675::"Denies"} Difficulty getting out of chair: {ACTIONS;DENIES/REPORTS:21021675::"Denies"} Difficulty using hands for taps, buttons, cutlery, and/or writing: {ACTIONS;DENIES/REPORTS:21021675::"Denies"}  No Rheumatology ROS completed.   PMFS History:  Patient Active Problem List   Diagnosis Date Noted  . Weakness   . AKI (acute kidney injury) ([place])   . Normocytic anemia   . Dysphagia   . Unintentional weight loss   . Hypercalcemia   . Renal mass 04/10/2022  . Vitamin B12 deficiency 11/04/2021  . DDD (degenerative disc disease), lumbar 03/11/2018  . ANA positive 01/22/2017  . 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 Rheumatoid arthritis involving multiple sites with positive rheumatoid factor ([place]) 06/02/2016  . High risk medication use 06/02/2016  . Hypertension 06/02/2016  . Vitamin D deficiency 06/02/2016    Past Medical History:  Diagnosis Date  . Anemia   . High risk medication use 06/02/2016  . Hypertension 06/02/2016  . Neutropenia ([place])   . Prediabetes   . Rheumatoid arthritis involving multiple sites with positive rheumatoid factor ([place]) 06/02/2016    Family History  Problem Relation Age of Onset  . Heart disease Mother   . Diabetes Mother   . Breast cancer Mother    . Kidney disease Father   . Diabetes Father   . Diabetes Daughter    No past surgical history on file. Social History   Social History Narrative  . Not on file   Immunization History  Administered Date(s) Administered  . Moderna Sars-Covid-2 Vaccination 10/12/2019, 11/15/2019  . PPD Test 03/20/2022     Objective: Vital Signs: There were no vitals taken for this visit.   Physical Exam   Musculoskeletal Exam: ***  CDAI Exam: CDAI Score: -- Patient Global: --; Provider Global: -- Swollen: --; Tender: -- Joint Exam 05/06/2022   No joint exam has been documented for this visit   There is currently no information documented on the homunculus. Go to the Rheumatology activity and complete the homunculus joint exam.  Investigation: No additional findings.  Imaging: US Carotid Bilateral  Result Date: 04/14/2022 CLINICAL DATA:  Hypertension and altered mental status. EXAM: BILATERAL CAROTID DUPLEX ULTRASOUND TECHNIQUE: Pearline Cables scale imaging, color Doppler and duplex ultrasound were performed of bilateral carotid and vertebral arteries in the neck. COMPARISON:  None Available. FINDINGS: Criteria: Quantification of carotid stenosis is based on velocity parameters that correlate the residual internal carotid diameter with NASCET-based stenosis levels, using the diameter of the distal internal carotid lumen as the denominator for stenosis measurement. The following velocity measurements were obtained: RIGHT ICA:  103/19 cm/sec CCA:  588/50 cm/sec SYSTOLIC ICA/CCA RATIO:  1.0 ECA:  61 cm/sec LEFT ICA:  123/26 cm/sec CCA:  277/41 cm/sec SYSTOLIC ICA/CCA RATIO:  1.1 ECA:  135 cm/sec RIGHT CAROTID ARTERY: Mild plaque at the level of  the right carotid bulb. No evidence of ICA plaque or stenosis. RIGHT VERTEBRAL ARTERY: Antegrade flow with normal waveform and velocity. LEFT CAROTID ARTERY: Mild plaque at the level of the left carotid bulb and likely extending to the proximal left ICA. Estimated left  ICA stenosis is less than 50%. LEFT VERTEBRAL ARTERY: Antegrade flow with normal waveform and velocity. IMPRESSION: Mild plaque at the level of both carotid bulbs and the proximal left ICA. Estimated left ICA stenosis is less than 50%. No evidence of right ICA stenosis. Electronically Signed   By: Aletta Edouard M.D.   On: 04/14/2022 08:20   CT CHEST WO CONTRAST  Result Date: 04/13/2022 CLINICAL DATA:  Unintended weight loss, renal mass, chest staging * Tracking Code: BO * EXAM: CT CHEST WITHOUT CONTRAST TECHNIQUE: Multidetector CT imaging of the chest was performed following the standard protocol without IV contrast. RADIATION DOSE REDUCTION: This exam was performed according to the departmental dose-optimization program which includes automated exposure control, adjustment of the mA and/or kV according to patient size and/or use of iterative reconstruction technique. COMPARISON:  MR abdomen, 04/10/2022 FINDINGS: Cardiovascular: Aortic atherosclerosis. Normal heart size. No pericardial effusion. Mediastinum/Nodes: No enlarged mediastinal, hilar, or axillary lymph nodes. Thymic remnant in the anterior mediastinum. Thyroid gland, trachea, and esophagus demonstrate no significant findings. Lungs/Pleura: Dependent bibasilar scarring or atelectasis. No pleural effusion or pneumothorax. Upper Abdomen: No acute abnormality. Partially imaged solid-appearing mass in the superior pole of the left kidney (series 2, image 158). Musculoskeletal: No chest wall abnormality. No acute osseous findings. Disc degenerative disease and bridging osteophytosis of the thoracic spine, consistent with DISH. IMPRESSION: 1. No evidence of lymphadenopathy or metastatic disease in the chest. 2. 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 Aortic Atherosclerosis (ICD10-I70.0). Electronically Signed   By: Delanna Ahmadi M.D.    On: 04/13/2022 21:38   MR ABDOMEN WO CONTRAST  Addendum Date: 04/12/2022   ADDENDUM REPORT: 04/12/2022 14:59 ADDENDUM: Addendum is made to correct inadvertent laterality error in initially issued report. Mass is as described however of the left kidney. Findings discussed with Dr. Claudia Desanctis via epic secure chat. Electronically Signed   By: Delanna Ahmadi M.D.   On: 04/12/2022 14:59   Result Date: 04/12/2022 CLINICAL DATA:  Left renal mass incidentally identified by ultrasound EXAM: MRI ABDOMEN WITHOUT CONTRAST TECHNIQUE: Multiplanar multisequence MR imaging was performed without the administration of intravenous contrast. COMPARISON:  Renal ultrasound, 04/10/2022 FINDINGS: Lower chest: No acute abnormality. Hepatobiliary: No solid liver abnormality is seen. Mildly distended gallbladder. No gallstones, gallbladder wall thickening, or biliary dilatation. Pancreas: Unremarkable. No pancreatic ductal dilatation or surrounding inflammatory changes. Spleen: Normal in size without significant abnormality. Adrenals/Urinary Tract: Adrenal glands are unremarkable. There is a heterogeneous mass of the posterior lip of the right kidney extending into the renal sinus, measuring 5.3 x 4.3 cm (series 4, image 17). Numerous bilateral small, simple and thinly septated benign renal cortical cysts, for which no further follow-up or characterization is required. No hydronephrosis. Stomach/Bowel: Stomach is within normal limits. No evidence of bowel wall thickening, distention, or inflammatory changes. Vascular/Lymphatic: No significant vascular findings are present. No enlarged abdominal lymph nodes. Other: No abdominal wall hernia or abnormality. No ascites. Musculoskeletal: No acute or significant osseous findings. IMPRESSION: 1. Heterogeneous mass of the posterior lip of the right kidney extending into the renal sinus, measuring 5.3 x 4.3 cm. Findings are most consistent with a  renal cell carcinoma, although in general incompletely  characterized by noncontrast MR. 2. No evidence of lymphadenopathy, obvious renal vein invasion, or obvious abdominal metastatic disease on noncontrast examination. Electronically Signed: By: Delanna Ahmadi M.D. On: 04/10/2022 19:03   ECHOCARDIOGRAM COMPLETE  Result Date: 04/11/2022    ECHOCARDIOGRAM REPORT   Patient Name:   Rayshun Kandler. Date of Exam: 04/11/2022 Medical Rec #:  [id number]          Height:       71.0 in Accession #:    [id number]         Weight:       175.0 lb Date of Birth:  1950-08-30          BSA:          1.992 m Patient Age:    66 years           BP:           102/67 mmHg Patient Gender: M                  HR:           66 bpm. Exam Location:  [facility] Procedure: 2D Echo and Intracardiac Opacification Agent Indications:     Stroke I63.9  History:         Patient has no prior history of Echocardiogram examinations.  Sonographer:     Kathlen Brunswick RDCS Referring Phys:  [id number] George Hugh Diagnosing Phys: Yolonda Kida MD  Sonographer Comments: Technically challenging study due to limited acoustic windows, no subcostal window, suboptimal apical window and suboptimal parasternal window. Image acquisition challenging due to patient body habitus. IMPRESSIONS  1. Left ventricular ejection fraction, by estimation, is 55 to 60%. The left ventricle has normal function. The left ventricle has no regional wall motion abnormalities. Left ventricular diastolic parameters are consistent with Grade I diastolic dysfunction (impaired relaxation).  2. Right ventricular systolic function is normal. The right ventricular size is normal.  3. Left atrial size was mildly dilated.  4. Right atrial size was mildly dilated.  5. The mitral valve is normal in structure. Mild mitral valve regurgitation.  6. The aortic valve is normal in structure. Aortic valve regurgitation is not visualized. FINDINGS  Left Ventricle: Left ventricular ejection fraction, by estimation, is 55 to 60%. The left ventricle has normal  function. The left ventricle has no regional wall motion abnormalities. Definity contrast agent was given IV to delineate the left ventricular  endocardial borders. The left ventricular internal cavity size was normal in size. There is no left ventricular hypertrophy. Left ventricular diastolic parameters are consistent with Grade I diastolic dysfunction (impaired relaxation). Right Ventricle: The right ventricular size is normal. No increase in right ventricular wall thickness. Right ventricular systolic function is normal. Left Atrium: Left atrial size was mildly dilated. Right Atrium: Right atrial size was mildly dilated. Pericardium: There is no evidence of pericardial effusion. Mitral Valve: The mitral valve is normal in structure. Mild mitral valve regurgitation. Tricuspid Valve: The tricuspid valve is normal in structure. Tricuspid valve regurgitation is mild. Aortic Valve: The aortic valve is normal in structure. Aortic valve regurgitation is not visualized. Pulmonic Valve: The pulmonic valve was normal in structure. Pulmonic valve regurgitation is not visualized. Aorta: The ascending aorta was not well visualized. IAS/Shunts: No atrial level shunt detected by color flow Doppler.  LEFT VENTRICLE PLAX 2D LVIDd:         4.85 cm LVIDs:  3.43 cm LV PW:         1.04 cm LV IVS:        0.94 cm LVOT diam:     2.30 cm LVOT Area:     4.15 cm  LEFT ATRIUM           Index LA diam:      4.30 cm 2.16 cm/m LA Vol (A4C): 39.1 ml 19.63 ml/m   AORTA Ao Root diam: 3.50 cm MITRAL VALVE MV Area (PHT): 2.03 cm    SHUNTS MV Decel Time: 373 msec    Systemic Diam: 2.30 cm MV E velocity: 69.00 cm/s MV A velocity: 80.10 cm/s MV E/A ratio:  0.86 Dwayne D Callwood MD Electronically signed by Yolonda Kida MD Signature Date/Time: 04/11/2022/2:42:38 PM    Final    US Renal  Result Date: 04/10/2022 CLINICAL DATA:  Renal failure. EXAM: RENAL / URINARY TRACT ULTRASOUND COMPLETE COMPARISON:  None Available. FINDINGS: Right  Kidney: Renal measurements: 11.2 x 5.4 x 5.4 cm = volume: 172 mL. 2.2 cm simple cyst is noted in lower pole. Increased echogenicity of renal parenchyma is noted suggesting medical renal disease. No mass or hydronephrosis visualized. Left Kidney: Renal measurements: 12.9 x 7.0 x 6.0 cm = volume: 284 mL. Increased echogenicity of renal parenchyma is noted suggesting medical renal disease. 5.2 cm heterogeneous solid mass is noted medially concerning for renal cell carcinoma. 1.5 cm simple cyst is noted in lower pole. No hydronephrosis is noted. Bladder: Appears normal for degree of bladder distention. Other: None. IMPRESSION: 5.2 cm heterogeneous solid mass seen in left kidney concerning for renal cell carcinoma. Further evaluation with MRI or CT scan is recommended. Increased echogenicity of renal parenchyma is noted bilaterally suggesting medical renal disease. Electronically Signed   By: Marijo Conception M.D.   On: 04/10/2022 16:56   CT HEAD WO CONTRAST (5MM)  Result Date: 04/10/2022 CLINICAL DATA:  Altered mental change EXAM: CT HEAD WITHOUT CONTRAST TECHNIQUE: Contiguous axial images were obtained from the base of the skull through the vertex without intravenous contrast. RADIATION DOSE REDUCTION: This exam was performed according to the departmental dose-optimization program which includes automated exposure control, adjustment of the mA and/or kV according to patient size and/or use of iterative reconstruction technique. COMPARISON:  MR brain done on 01/14/2022 FINDINGS: Brain: No acute intracranial findings are seen. There are no signs of bleeding within the cranium. There are possible old lacunar infarcts in the cerebellum on both sides. Cortical sulci are prominent. There is no focal edema or mass effect. Vascular: Scattered arterial calcifications are seen. Skull: Unremarkable. Sinuses/Orbits: Unremarkable. Other: None. IMPRESSION: No acute intracranial findings are seen in noncontrast CT brain. Atrophy.  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 Electronically Signed   By: Elmer Picker M.D.   On: 04/10/2022 14:04   DG Chest 2 View  Result Date: 04/10/2022 CLINICAL DATA:  Weakness and cough EXAM: CHEST - 2 VIEW COMPARISON:  None Available. FINDINGS: Normal mediastinum and cardiac silhouette. Normal pulmonary vasculature. No evidence of effusion, infiltrate, or pneumothorax. No acute bony abnormality. Degenerative osteophytosis of the spine. IMPRESSION: No acute cardiopulmonary process. Electronically Signed   By: Suzy Bouchard M.D.   On: 04/10/2022 13:43    Recent Labs: Lab Results  Component Value Date   WBC 4.1 04/13/2022   HGB 7.9 (L) 04/13/2022   PLT 157 04/13/2022   NA 134 04/22/2022   K 4.1 04/22/2022   CL 98 04/22/2022  CO2 20 04/22/2022   GLUCOSE 102 (H) 04/22/2022   BUN 18 04/22/2022   CREATININE 1.23 04/22/2022   BILITOT 1.1 04/10/2022   ALKPHOS 94 04/10/2022   AST 32 04/10/2022   ALT 22 04/10/2022   PROT 8.7 (H) 04/10/2022   ALBUMIN 3.3 (L) 04/10/2022   CALCIUM 8.7 04/22/2022   GFRAA 86 02/03/2021   QFTBGOLD NEGATIVE 06/16/2017   QFTBGOLDPLUS INDETERMINATE (A) 03/06/2022    Speciality Comments: TB Skin Test 03/23/2022: Negative  Procedures:  No procedures performed Allergies: Patient has no known allergies.   Assessment / Plan:     Visit Diagnoses: No diagnosis found.  Orders: No orders of the defined types were placed in this encounter.  No orders of the defined types were placed in this encounter.   Face-to-face time spent with patient was *** minutes. Greater than 50% of time was spent in counseling and coordination of care.  Follow-Up Instructions: No follow-ups on file.   Earnestine Mealing, CMA  Note - This record has been created using Editor, commissioning.  Chart creation errors have been sought, but may not always  have been located. Such creation errors do not reflect on  the standard of medical care.

## 2022-04-23 NOTE — Telephone Encounter (Signed)
-----   Message from Hollice Espy, MD sent at 04/23/2022  3:08 PM EDT ----- Doristine Devoid news, creatinine is already back down close to baseline at 1.23.  I still think it would be reasonable to go ahead move forward with renal mass biopsy to ensure that this in fact is a malignancy prior to further discussion of radical nephrectomy.  Please enter an order for renal biopsy and also obtain clearance to hold antiplatelet/aspirin therapy.  Additionally, the PSA is actually rising unfortunately.  I like to wait 3 months and repeat this at the time of discussion of renal mass biopsy.  If it still going up, will likely need to also pursue prostate biopsy.  Hollice Espy, MD

## 2022-04-24 ENCOUNTER — Other Ambulatory Visit: Payer: Self-pay | Admitting: Rheumatology

## 2022-04-24 NOTE — Telephone Encounter (Signed)
Incoming call from pt's wife (on [place]) informed her of the information below. Wife voiced understanding. All questions answered.

## 2022-04-24 NOTE — Telephone Encounter (Signed)
Spoke with patient's wife to let them know that Dr. Estanislado Pandy will send in prednisone prescription for '10mg'$  daily.

## 2022-04-24 NOTE — Telephone Encounter (Signed)
Patient called the office stating he is having to take 2 of his 5 mg Prednisone a day because the one doesn't help. Patient requests a higher dosage of prednisone.

## 2022-04-24 NOTE — Telephone Encounter (Signed)
I left a message on the answering machine as no one picked up the phone.  Please call in a prednisone 5 mg, 2 tablets p.o. daily for 30 days.  Patient has an appointment coming up on the 27th of September.

## 2022-04-24 NOTE — Addendum Note (Signed)
Addended by: Bertram Savin on: 04/24/2022 03:54 PM   Modules accepted: Orders

## 2022-04-26 MED ORDER — PREDNISONE 5 MG PO TABS: 10.0000 mg | ORAL_TABLET | Freq: Every day | ORAL | 0 refills | Status: DC

## 2022-04-29 ENCOUNTER — Telehealth: Payer: Self-pay | Admitting: Oncology

## 2022-04-29 NOTE — Telephone Encounter (Signed)
Spoke to pt's wife Solmon Ice, who states they have a  lot going on at the moment and will call back to r/s. Informed her that biopsy was ordered by Dr. Erlene Quan, therefore results should come from her. She verbalized understanding.

## 2022-04-29 NOTE — Progress Notes (Signed)
Patient on schedule for Renal biopsy 9/27, called and spoke with patients wife/Michael Hinton on phone with pre procedure instructions given,. Clearance given per [facility] to hold plavix for procedure with LD 9/21 for biopsy. Made aware to be here at 0900, NPO after MN prior to day of procedure as well as driver post procedure/recovery/discharge. Stated understanding.

## 2022-04-29 NOTE — Telephone Encounter (Signed)
Pt wife called and stated they needed to cancel his appts on 9/28. She mentioned that he is getting a biopsy on his kidney and she wanted to see what the results are for that and she will call back to reschedule.

## 2022-04-29 NOTE — Telephone Encounter (Signed)
Appointment has been scheduled for 05/06/2022.  Liana Gerold, I can put him on for Wed 9/27 at [place] and arrival at [place].  Dr Anselm Pancoast approved it for a CT guided Renal Mass Biopsy but said to let the MD that will be doing the procedure that day to decide if he wanted to do it [place] or [place].  Dr Earleen Newport will be here on Tues 9/26 and Wed 9/27 so I will ask him on Tues 9/26 how he wants to proceed.  Right now, I will leave it as it is - US guided and then if I need to change it on next Tues then I will let you know if it needs to be e-signed again.  The pt's last day for his Plavix is tomorrow.  He can take it tomorrow and then not again until 2 days after his procedure per Dr Clayborn Bigness.  Please let me know if this does not work for him.  Thanks

## 2022-04-30 NOTE — Progress Notes (Unsigned)
Office Visit Note  Patient: Michael Hinton.             Date of Birth: 1950/09/27           MRN: [id number]             PCP: Danelle Berry, NP Referring: Danelle Berry, NP Visit Date: 05/14/2022 Occupation: '@GUAROCC'$ @  Subjective:  Discuss prednisone dose   History of Present Illness: Michael Hinton. is a 71 y.o. male with history of seropositive rheumatoid arthritis.  Patient is not currently taking any immunosuppressive agents.  He was advised discontinued Kevzara on 04/09/2022 by Dr. Estanislado Pandy. He is taking prednisone 10 mg daily for management of his pain and swelling due to rheumatoid arthritis.  He has been tolerating prednisone without any side effects.  He continues to have some persistent discomfort and stiffness in both hands but overall his joint swelling has improved significantly on the current dose of prednisone. Of note the patient was hospitalized on 9/1 through 04/14/2022.  Patient was found to have a left renal mass as well as acute kidney injury while hospitalized.  He establish care with Dr. Erlene Quan on 04/22/2022.  Patient had a renal biopsy performed on 05/06/2022 but has not yet heard back about the results.  His next appointment with Dr. Erlene Quan is not until 07/28/22.     Activities of Daily Living:  Patient reports morning stiffness for 1 hour.   Patient Denies nocturnal pain.  Difficulty dressing/grooming: Reports Difficulty climbing stairs: Denies Difficulty getting out of chair: Denies Difficulty using hands for taps, buttons, cutlery, and/or writing: Denies  Review of Systems  Constitutional:  Negative for fatigue.  HENT:  Negative for mouth sores and mouth dryness.   Eyes:  Negative for dryness.  Respiratory:  Negative for shortness of breath.   Cardiovascular:  Negative for chest pain and palpitations.  Gastrointestinal:  Negative for blood in stool, constipation and diarrhea.  Endocrine: Negative for increased urination.  Genitourinary:   Negative for involuntary urination.  Musculoskeletal:  Positive for joint pain, joint pain and morning stiffness. Negative for gait problem, joint swelling, myalgias, muscle weakness, muscle tenderness and myalgias.  Skin:  Negative for color change, rash and sensitivity to sunlight.  Allergic/Immunologic: Negative for susceptible to infections.  Neurological:  Negative for dizziness and headaches.  Hematological:  Negative for swollen glands.  Psychiatric/Behavioral:  Negative for depressed mood and sleep disturbance. The patient is not nervous/anxious.     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   Rheumatoid arthritis involving multiple sites with positive rheumatoid factor ([place]) 06/02/2016   High risk medication use 06/02/2016   Hypertension 06/02/2016   Vitamin D deficiency 06/02/2016    Past Medical History:  Diagnosis Date   Anemia    High risk medication use 06/02/2016   Hypertension 06/02/2016   Neutropenia (HCC)    Prediabetes    Rheumatoid arthritis involving multiple sites with positive rheumatoid factor ([place]) 06/02/2016    Family History  Problem Relation Age of Onset  Heart disease Mother    Diabetes Mother    Breast cancer Mother    Kidney disease Father    Diabetes Father    Diabetes Daughter    History reviewed. No pertinent surgical history. Social History   Social History Narrative   Not on file   Immunization History  Administered Date(s) Administered   Moderna Sars-Covid-2 Vaccination 10/12/2019, 11/15/2019   PPD Test 03/20/2022     Objective: Vital  Signs: BP 132/72 (BP Location: Left Arm, Patient Position: Sitting, Cuff Size: Normal)   Pulse 71   Resp 15   Ht '5\' 11"'$  (1.803 m)   Wt 179 lb 12.8 oz (81.6 kg)   BMI 25.08 kg/m    Physical Exam Vitals and nursing note reviewed.  Constitutional:      Appearance: He is well-developed.  HENT:     Head: Normocephalic and atraumatic.  Eyes:     Conjunctiva/sclera: Conjunctivae normal.     Pupils: Pupils are equal, round, and reactive to light.  Cardiovascular:     Rate and Rhythm: Normal rate and regular rhythm.     Heart sounds: Normal heart sounds.  Pulmonary:     Effort: Pulmonary effort is normal.     Breath sounds: Normal breath sounds.  Abdominal:     General: Bowel sounds are normal.     Palpations: Abdomen is soft.  Musculoskeletal:     Cervical back: Normal range of motion and neck supple.  Skin:    General: Skin is warm and dry.     Capillary Refill: Capillary refill takes less than 2 seconds.  Neurological:     Mental Status: He is alert and oriented to person, place, and time.  Psychiatric:        Behavior: Behavior normal.      Musculoskeletal Exam: Patient remained seated during the examination today.  C-spine has limited range of motion with lateral rotation.  Shoulder joints have painful and limited range of motion especially with abduction and internal rotation.  Crepitus in both shoulders noted bilaterally.  Elbow joint contractures noted.  2 small rheumatoid nodules noted on extensor surface of the right elbow.  Limited range of motion of both wrist joints.  Thickening of all MCP joints.  Incomplete fist formation.  PIP and DIP thickening noted.  Rheumatoid nodules overlying both hands noted.  Warmth and flexion contracture right knee.  No effusion noted. Subluxation of both ankles.  CDAI Exam: CDAI Score: -- Patient Global: 5 mm; Provider Global: 5 mm Swollen: --; Tender: -- Joint Exam 05/14/2022   No joint exam has been documented for this visit   There  is currently no information documented on the homunculus. Go to the Rheumatology activity and complete the homunculus joint exam.  Investigation: No additional findings.  Imaging: CT RENAL BIOPSY  Result Date: 05/06/2022 INDICATION: 71 year old male referred for biopsy of left renal mass EXAM: CT-GUIDED LEFT KIDNEY MASS BIOPSY TECHNIQUE: Multidetector CT imaging of the abdomen was performed following the standard protocol without IV contrast. RADIATION DOSE REDUCTION: This exam was performed according to the departmental dose-optimization program which includes automated exposure control, adjustment of the mA and/or kV according to patient size and/or use of iterative reconstruction technique. MEDICATIONS: None. ANESTHESIA/SEDATION: Moderate (conscious) sedation was employed during this procedure. A total of Versed 1.5 mg and Fentanyl 75 mcg was administered intravenously by the radiology nurse. Total intra-service moderate Sedation Time: 12 minutes. The patient's level of consciousness and vital signs were monitored continuously by radiology nursing  throughout the procedure under my direct supervision. COMPLICATIONS: None PROCEDURE: Informed written consent was obtained from the patient after a thorough discussion of the procedural risks, benefits and alternatives. All questions were addressed. Maximal Sterile Barrier Technique was utilized including caps, mask, sterile gowns, sterile gloves, sterile drape, hand hygiene and skin antiseptic. A timeout was performed prior to the initiation of the procedure. Patient positioned prone on the CT gantry table. Scout CT acquired for planning purposes. The patient was then prepped and draped in the usual sterile fashion. 1% lidocaine was used for local anesthesia. Using CT guidance, guide needle was advanced into the mass of the medial left kidney. Once we confirmed needle tip position, multiple 18 gauge core biopsy were acquired. Gel-Foam pledgets were infused with  small amount of saline. Needle was removed and a final image was stored. Patient tolerated the procedure well and remained hemodynamically stable throughout. No complications were encountered and no significant blood loss. IMPRESSION: Status post CT-guided biopsy of left renal mass. Signed, Dulcy Fanny. Nadene Rubins, RPVI [facility] Electronically Signed   By: Corrie Mckusick D.O.   On: 05/06/2022 11:41    Recent Labs: Lab Results  Component Value Date   WBC 4.1 04/13/2022   HGB 7.9 (L) 04/13/2022   PLT 157 04/13/2022   NA 134 04/22/2022   K 4.1 04/22/2022   CL 98 04/22/2022   CO2 20 04/22/2022   GLUCOSE 102 (H) 04/22/2022   BUN 18 04/22/2022   CREATININE 1.23 04/22/2022   BILITOT 1.1 04/10/2022   ALKPHOS 94 04/10/2022   AST 32 04/10/2022   ALT 22 04/10/2022   PROT 8.7 (H) 04/10/2022   ALBUMIN 3.3 (L) 04/10/2022   CALCIUM 8.7 04/22/2022   GFRAA 86 02/03/2021   QFTBGOLD NEGATIVE 06/16/2017   QFTBGOLDPLUS INDETERMINATE (A) 03/06/2022    Speciality Comments: TB Skin Test 03/23/2022: Negative  Procedures:  No procedures performed Allergies: Patient has no known allergies.   Assessment / Plan:     Visit Diagnoses: Rheumatoid arthritis involving multiple sites with positive rheumatoid factor ([place]) -Patient is currently taking prednisone 10 mg daily.  He is tolerating prednisone without any side effects and has been taking it in the morning with food and avoiding the use of NSAIDs as advised.  He has noticed improvement since increasing the dose of prednisone from 5 mg to 10 mg daily.  The swelling in his hands has resolved.  He continues to have pain and stiffness in both shoulders, both hands, and his right knee joint.  He discontinued Kevzara on 04/09/2022 as advised by Dr. Estanislado Pandy.  He has been undergoing a thorough work-up after an incidental left renal mass was noted while hospitalized on 04/10/2022.  Renal biopsy was performed  on 05/06/2022 which was consistent with renal cell carcinoma.  Patient is currently under the care of Dr. Erlene Quan and was referred to nephrology.  Patient will likely require a radical left nephrectomy.  We will hold off on adding any immunosuppressive agents at this time in anticipation for the patient requiring surgery.  He will require clearance prior to restarting on immunosuppressive therapy in the future.  For now he will remain on prednisone 10 mg daily.  He is aware of the risks of long-term prednisone.  He was advised to notify us if he develops increased joint pain or joint swelling. He will follow-up the office in 2 months or sooner if needed.  High risk medication use -Patient is not currently  taking any immunosuppressive agents.  He will remain on prednisone 10 mg daily.     He initiated [place] on 10/30/2021-discontinued on 04/09/22.   Previous therapy includes: Enbrel and Humira.  Previously spacing Enbrel as well as Kevzara due to low white blood cell count and chronic anemia. BMP drawn on 04/22/2022.  CBC drawn on 04/13/2022. TB skin test negative on 03/23/2022.  Other drug-induced neutropenia [facility]): Patient was previously spacing Kevzara dosing to every 18 days.  Patient discontinued Kevzara on 04/09/2022.  Left renal mass: Incidental finding while hospitalized 9/1 to 04/14/2022. Patient was advised to discontinue Kevzara on 04/09/2022. Reviewed Dr. Cherrie Gauze office visit note from 04/22/2022. 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 Patient had extensive evaluation including CT of the chest on 04/13/22: No evidence of lymphadenopathy or metastatic disease.  MRI of abdomen on 04/10/2022 did not reveal any lymphadenopathy or obvious abdominal metastatic disease. Patient will likely require radical nephrectomy. We will follow-up with the patient in 2 months.  In the future he will require clearance prior to [place] adding any immunosuppressive  agents.  ANA positive: No clinical features of systemic lupus at this time.  Contracture of left elbow: Unchanged.  Contracture of right elbow: Unchanged.  2 small rheumatoid nodules noted on the extensor surface of the right elbow.  Contractures of both knees: Chronic pain and stiffness.  Limited extension and warmth of the right knee.  Other medical conditions are listed as follows:  Vitamin D deficiency  Primary hypertension: Blood pressure was 132/72 today in the office.  Spinal stenosis of lumbar region without neurogenic claudication  DDD (degenerative disc disease), lumbar: He has not noticed any new or worsening symptoms in his lower back.  No symptoms of radiculopathy at this time.  Orders: No orders of the defined types were placed in this encounter.  Meds ordered this encounter  Medications   predniSONE (DELTASONE) 5 MG tablet    Sig: Take 2 tablets (10 mg total) by mouth daily with breakfast.    Dispense:  60 tablet    Refill:  2     Follow-Up Instructions: Return in about 2 months (around 07/14/2022) for Rheumatoid arthritis.   Ofilia Neas, PA-C  Note - This record has been created using Dragon software.  Chart creation errors have been sought, but may not always  have been located. Such creation errors do not reflect on  the standard of medical care.

## 2022-05-04 ENCOUNTER — Other Ambulatory Visit (HOSPITAL_COMMUNITY): Payer: Self-pay | Admitting: Radiology

## 2022-05-04 ENCOUNTER — Telehealth: Payer: Self-pay | Admitting: *Deleted

## 2022-05-04 ENCOUNTER — Other Ambulatory Visit (HOSPITAL_COMMUNITY): Payer: Self-pay

## 2022-05-04 ENCOUNTER — Ambulatory Visit: Payer: Medicare HMO | Admitting: Physician Assistant

## 2022-05-04 DIAGNOSIS — N2889 Other specified disorders of kidney and ureter: Secondary | ICD-10-CM

## 2022-05-04 NOTE — Telephone Encounter (Signed)
Patient contacted the office stating he has only one tablet of prednisone left. Patient advised Dr. Estanislado Pandy sent a prescription to the pharmacy on 04/26/2022 for patient to take 10 mg daily. Patient states he has not picked up that prescription from the pharmacy. Patient advised to contact the pharmacy for prescription. Patient advised to contact the office if he has any trouble getting prescription. Patient expressed understanding.

## 2022-05-05 ENCOUNTER — Other Ambulatory Visit: Payer: Self-pay | Admitting: Internal Medicine

## 2022-05-05 NOTE — H&P (Signed)
Chief Complaint: Left Renal Mass  Referring Physician(s): Hollice Espy  Supervising Physician: Corrie Mckusick  Patient Status: [facility] - Out-pt  History of Present Illness: Michael Hinton. is a 71 y.o. male who was referred to Urology for elevated PSA.  He was found to have acute kidney injury with creatinine of 4.72.   Renal ultrasound showed incidental 5 cm left renal mass.  MRI showed = Heterogeneous mass extending from the posterior lip of the right kidney extending to the renal sinus measuring 5.3 x 4.3 centimeters  We are asked to evaluate him for biopsy of the mass.  He is NPO. No nausea/vomiting. No Fever/chills. ROS negative.  He takes Plavix for history of stroke. He has held this medication since last Thursday.  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 No pertinent surgical history.  Allergies: Patient has no known allergies.  Medications: Prior to Admission medications   Medication Sig Start Date End Date Taking? Authorizing Provider  amLODipine (NORVASC) 10 MG tablet Take 10 mg by mouth every morning. 04/18/21   [provider]  Cholecalciferol (VITAMIN D-3) 125 MCG (5000 UT) TABS Take 1 tablet by mouth daily.    [provider]  clopidogrel (PLAVIX) 75 MG tablet Take 75 mg by mouth daily. 01/24/22   [provider]  Ferrous Sulfate (IRON PO) Take 1 tablet by mouth daily.    [provider]  folic acid (FOLVITE) 1 MG tablet Take 1 tablet (1 mg total) by mouth daily. 04/15/22   Shelly Coss, MD  mirtazapine (REMERON) 7.5 MG tablet Take 7.5 mg by mouth at bedtime. 04/08/22   [provider]  polyethylene glycol (MIRALAX / GLYCOLAX) 17 g packet Take 17 g by mouth daily as needed. 04/14/22    Shelly Coss, MD  predniSONE (DELTASONE) 5 MG tablet Take 1 tablet (5 mg total) by mouth daily with breakfast. 04/16/22   Bo Merino, MD  predniSONE (DELTASONE) 5 MG tablet Take 2 tablets (10 mg total) by mouth daily with breakfast. 04/26/22   Ofilia Neas, PA-C  rosuvastatin (CRESTOR) 40 MG tablet Take 40 mg by mouth daily. 01/24/22   [provider]  sodium bicarbonate 650 MG tablet Take 1 tablet (650 mg total) by mouth 2 (two) times daily. 04/14/22   Shelly Coss, MD     Family History  Problem Relation Age of Onset   Heart disease Mother    Diabetes Mother    Breast cancer Mother    Kidney disease Father    Diabetes Father    Diabetes Daughter     Social History   Socioeconomic History   Marital status: Married    Spouse name: Not on file   Number of children: Not on file   Years of education: Not on file   Highest education level: Not on file  Occupational History   Not on file  Tobacco Use   Smoking status: Former    Packs/day: 0.50    Years: 7.00    Total pack years: 3.50    Types: Cigarettes    Quit date: 03/15/1978    Years since quitting: 44.1    Passive exposure: Past   Smokeless tobacco: Never  Vaping Use   Vaping Use: Never  used  Substance and Sexual Activity   Alcohol use: Not Currently   Drug use: No   Sexual activity: Not on file  Other Topics Concern   Not on file  Social History Narrative   Not on file   Social Determinants of Health   Financial Resource Strain: Not on file  Food Insecurity: Not on file  Transportation Needs: Not on file  Physical Activity: Not on file  Stress: Not on file  Social Connections: Not on file     Review of Systems: A 12 point ROS discussed and pertinent positives are indicated in the HPI above.  All other systems are negative.  Review of Systems  Vital Signs: BP 131/78   Pulse 67   Temp 98.4 F (36.9 C) (Oral)   Ht '5\' 11"'$  (1.803 m)   Wt 180 lb (81.6 kg)   SpO2 97%   BMI 25.10 kg/m    Physical Exam Constitutional:      Appearance: Normal appearance.  HENT:     Head: Normocephalic and atraumatic.  Cardiovascular:     Rate and Rhythm: Normal rate and regular rhythm.  Pulmonary:     Effort: Pulmonary effort is normal. No respiratory distress.     Breath sounds: Normal breath sounds.  Abdominal:     Palpations: Abdomen is soft.     Tenderness: There is no abdominal tenderness.  Skin:    General: Skin is warm and dry.  Neurological:     General: No focal deficit present.     Mental Status: He is alert and oriented to person, place, and time.  Psychiatric:        Mood and Affect: Mood normal.        Behavior: Behavior normal.        Thought Content: Thought content normal.        Judgment: Judgment normal.     Imaging: US Carotid Bilateral  Result Date: 04/14/2022 CLINICAL DATA:  Hypertension and altered mental status. EXAM: BILATERAL CAROTID DUPLEX ULTRASOUND TECHNIQUE: Pearline Cables scale imaging, color Doppler and duplex ultrasound were performed of bilateral carotid and vertebral arteries in the neck. COMPARISON:  None Available. FINDINGS: Criteria: Quantification of carotid stenosis is based on velocity parameters that correlate the residual internal carotid diameter with NASCET-based stenosis levels, using the diameter of the distal internal carotid lumen as the denominator for stenosis measurement. The following velocity measurements were obtained: RIGHT ICA:  103/19 cm/sec CCA:  517/61 cm/sec SYSTOLIC ICA/CCA RATIO:  1.0 ECA:  61 cm/sec LEFT ICA:  123/26 cm/sec CCA:  607/37 cm/sec SYSTOLIC ICA/CCA RATIO:  1.1 ECA:  135 cm/sec RIGHT CAROTID ARTERY: Mild plaque at the level of the right carotid bulb. No evidence of ICA plaque or stenosis. RIGHT VERTEBRAL ARTERY: Antegrade flow with normal waveform and velocity. LEFT CAROTID ARTERY: Mild plaque at the level of the left carotid bulb and likely extending to the proximal left ICA. Estimated left ICA stenosis is less than 50%.  LEFT VERTEBRAL ARTERY: Antegrade flow with normal waveform and velocity. IMPRESSION: Mild plaque at the level of both carotid bulbs and the proximal left ICA. Estimated left ICA stenosis is less than 50%. No evidence of right ICA stenosis. Electronically Signed   By: Aletta Edouard M.D.   On: 04/14/2022 08:20   CT CHEST WO CONTRAST  Result Date: 04/13/2022 CLINICAL DATA:  Unintended weight loss, renal mass, chest staging * Tracking Code: BO * EXAM: CT CHEST WITHOUT CONTRAST TECHNIQUE: Multidetector CT imaging of the chest was  performed following the standard protocol without IV contrast. RADIATION DOSE REDUCTION: This exam was performed according to the departmental dose-optimization program which includes automated exposure control, adjustment of the mA and/or kV according to patient size and/or use of iterative reconstruction technique. COMPARISON:  MR abdomen, 04/10/2022 FINDINGS: Cardiovascular: Aortic atherosclerosis. Normal heart size. No pericardial effusion. Mediastinum/Nodes: No enlarged mediastinal, hilar, or axillary lymph nodes. Thymic remnant in the anterior mediastinum. Thyroid gland, trachea, and esophagus demonstrate no significant findings. Lungs/Pleura: Dependent bibasilar scarring or atelectasis. No pleural effusion or pneumothorax. Upper Abdomen: No acute abnormality. Partially imaged solid-appearing mass in the superior pole of the left kidney (series 2, image 158). Musculoskeletal: No chest wall abnormality. No acute osseous findings. Disc degenerative disease and bridging osteophytosis of the thoracic spine, consistent with DISH. IMPRESSION: 1. No evidence of lymphadenopathy or metastatic disease in the chest. 2. 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 Aortic Atherosclerosis (ICD10-I70.0). Electronically Signed   By: Delanna Ahmadi M.D.   On: 04/13/2022 21:38   MR  ABDOMEN WO CONTRAST  Addendum Date: 04/12/2022   ADDENDUM REPORT: 04/12/2022 14:59 ADDENDUM: Addendum is made to correct inadvertent laterality error in initially issued report. Mass is as described however of the left kidney. Findings discussed with Dr. Claudia Desanctis via epic secure chat. Electronically Signed   By: Delanna Ahmadi M.D.   On: 04/12/2022 14:59   Result Date: 04/12/2022 CLINICAL DATA:  Left renal mass incidentally identified by ultrasound EXAM: MRI ABDOMEN WITHOUT CONTRAST TECHNIQUE: Multiplanar multisequence MR imaging was performed without the administration of intravenous contrast. COMPARISON:  Renal ultrasound, 04/10/2022 FINDINGS: Lower chest: No acute abnormality. Hepatobiliary: No solid liver abnormality is seen. Mildly distended gallbladder. No gallstones, gallbladder wall thickening, or biliary dilatation. Pancreas: Unremarkable. No pancreatic ductal dilatation or surrounding inflammatory changes. Spleen: Normal in size without significant abnormality. Adrenals/Urinary Tract: Adrenal glands are unremarkable. There is a heterogeneous mass of the posterior lip of the right kidney extending into the renal sinus, measuring 5.3 x 4.3 cm (series 4, image 17). Numerous bilateral small, simple and thinly septated benign renal cortical cysts, for which no further follow-up or characterization is required. No hydronephrosis. Stomach/Bowel: Stomach is within normal limits. No evidence of bowel wall thickening, distention, or inflammatory changes. Vascular/Lymphatic: No significant vascular findings are present. No enlarged abdominal lymph nodes. Other: No abdominal wall hernia or abnormality. No ascites. Musculoskeletal: No acute or significant osseous findings. IMPRESSION: 1. Heterogeneous mass of the posterior lip of the right kidney extending into the renal sinus, measuring 5.3 x 4.3 cm. Findings are most consistent with a renal cell carcinoma, although in general incompletely characterized by noncontrast  MR. 2. No evidence of lymphadenopathy, obvious renal vein invasion, or obvious abdominal metastatic disease on noncontrast examination. Electronically Signed: By: Delanna Ahmadi M.D. On: 04/10/2022 19:03   ECHOCARDIOGRAM COMPLETE  Result Date: 04/11/2022    ECHOCARDIOGRAM REPORT   Patient Name:   Candelario Steppe. Date of Exam: 04/11/2022 Medical Rec #:  [id number]          Height:       71.0 in Accession #:    [id number]         Weight:       175.0 lb Date of Birth:  Sep 15, 1950          BSA:          1.992 m Patient Age:  70 years           BP:           102/67 mmHg Patient Gender: M                  HR:           66 bpm. Exam Location:  [facility] Procedure: 2D Echo and Intracardiac Opacification Agent Indications:     Stroke I63.9  History:         Patient has no prior history of Echocardiogram examinations.  Sonographer:     Kathlen Brunswick RDCS Referring Phys:  [id number] George Hugh Diagnosing Phys: Yolonda Kida MD  Sonographer Comments: Technically challenging study due to limited acoustic windows, no subcostal window, suboptimal apical window and suboptimal parasternal window. Image acquisition challenging due to patient body habitus. IMPRESSIONS  1. Left ventricular ejection fraction, by estimation, is 55 to 60%. The left ventricle has normal function. The left ventricle has no regional wall motion abnormalities. Left ventricular diastolic parameters are consistent with Grade I diastolic dysfunction (impaired relaxation).  2. Right ventricular systolic function is normal. The right ventricular size is normal.  3. Left atrial size was mildly dilated.  4. Right atrial size was mildly dilated.  5. The mitral valve is normal in structure. Mild mitral valve regurgitation.  6. The aortic valve is normal in structure. Aortic valve regurgitation is not visualized. FINDINGS  Left Ventricle: Left ventricular ejection fraction, by estimation, is 55 to 60%. The left ventricle has normal function. The left ventricle  has no regional wall motion abnormalities. Definity contrast agent was given IV to delineate the left ventricular  endocardial borders. The left ventricular internal cavity size was normal in size. There is no left ventricular hypertrophy. Left ventricular diastolic parameters are consistent with Grade I diastolic dysfunction (impaired relaxation). Right Ventricle: The right ventricular size is normal. No increase in right ventricular wall thickness. Right ventricular systolic function is normal. Left Atrium: Left atrial size was mildly dilated. Right Atrium: Right atrial size was mildly dilated. Pericardium: There is no evidence of pericardial effusion. Mitral Valve: The mitral valve is normal in structure. Mild mitral valve regurgitation. Tricuspid Valve: The tricuspid valve is normal in structure. Tricuspid valve regurgitation is mild. Aortic Valve: The aortic valve is normal in structure. Aortic valve regurgitation is not visualized. Pulmonic Valve: The pulmonic valve was normal in structure. Pulmonic valve regurgitation is not visualized. Aorta: The ascending aorta was not well visualized. IAS/Shunts: No atrial level shunt detected by color flow Doppler.  LEFT VENTRICLE PLAX 2D LVIDd:         4.85 cm LVIDs:         3.43 cm LV PW:         1.04 cm LV IVS:        0.94 cm LVOT diam:     2.30 cm LVOT Area:     4.15 cm  LEFT ATRIUM           Index LA diam:      4.30 cm 2.16 cm/m LA Vol (A4C): 39.1 ml 19.63 ml/m   AORTA Ao Root diam: 3.50 cm MITRAL VALVE MV Area (PHT): 2.03 cm    SHUNTS MV Decel Time: 373 msec    Systemic Diam: 2.30 cm MV E velocity: 69.00 cm/s MV A velocity: 80.10 cm/s MV E/A ratio:  0.86 Dwayne D Callwood MD Electronically signed by Yolonda Kida MD Signature Date/Time: 04/11/2022/2:42:38 PM    Final  US Renal  Result Date: 04/10/2022 CLINICAL DATA:  Renal failure. EXAM: RENAL / URINARY TRACT ULTRASOUND COMPLETE COMPARISON:  None Available. FINDINGS: Right Kidney: Renal measurements: 11.2  x 5.4 x 5.4 cm = volume: 172 mL. 2.2 cm simple cyst is noted in lower pole. Increased echogenicity of renal parenchyma is noted suggesting medical renal disease. No mass or hydronephrosis visualized. Left Kidney: Renal measurements: 12.9 x 7.0 x 6.0 cm = volume: 284 mL. Increased echogenicity of renal parenchyma is noted suggesting medical renal disease. 5.2 cm heterogeneous solid mass is noted medially concerning for renal cell carcinoma. 1.5 cm simple cyst is noted in lower pole. No hydronephrosis is noted. Bladder: Appears normal for degree of bladder distention. Other: None. IMPRESSION: 5.2 cm heterogeneous solid mass seen in left kidney concerning for renal cell carcinoma. Further evaluation with MRI or CT scan is recommended. Increased echogenicity of renal parenchyma is noted bilaterally suggesting medical renal disease. Electronically Signed   By: Marijo Conception M.D.   On: 04/10/2022 16:56   CT HEAD WO CONTRAST (5MM)  Result Date: 04/10/2022 CLINICAL DATA:  Altered mental change EXAM: CT HEAD WITHOUT CONTRAST TECHNIQUE: Contiguous axial images were obtained from the base of the skull through the vertex without intravenous contrast. RADIATION DOSE REDUCTION: This exam was performed according to the departmental dose-optimization program which includes automated exposure control, adjustment of the mA and/or kV according to patient size and/or use of iterative reconstruction technique. COMPARISON:  MR brain done on 01/14/2022 FINDINGS: Brain: No acute intracranial findings are seen. There are no signs of bleeding within the cranium. There are possible old lacunar infarcts in the cerebellum on both sides. Cortical sulci are prominent. There is no focal edema or mass effect. Vascular: Scattered arterial calcifications are seen. Skull: Unremarkable. Sinuses/Orbits: Unremarkable. Other: None. IMPRESSION: No acute intracranial findings are seen in noncontrast CT brain. Atrophy. Small old lacunar infarcts are  seen in cerebellum on both sides. Electronically Signed   By: Elmer Picker M.D.   On: 04/10/2022 14:04   DG Chest 2 View  Result Date: 04/10/2022 CLINICAL DATA:  Weakness and cough EXAM: CHEST - 2 VIEW COMPARISON:  None Available. FINDINGS: Normal mediastinum and cardiac silhouette. Normal pulmonary vasculature. No evidence of effusion, infiltrate, or pneumothorax. No acute bony abnormality. Degenerative osteophytosis of the spine. IMPRESSION: No acute cardiopulmonary process. Electronically Signed   By: Suzy Bouchard M.D.   On: 04/10/2022 13:43    Labs:  CBC: Recent Labs    04/10/22 1254 04/11/22 0509 04/12/22 0416 04/13/22 0451  WBC 4.0 4.4 3.2* 4.1  HGB 10.9* 9.0* 8.4* 7.9*  HCT 35.1* 28.8* 26.5* 24.9*  PLT 226 182 157 157    COAGS: No results for input(s): "INR", "APTT" in the last 8760 hours.  BMP: Recent Labs    04/11/22 0509 04/11/22 1815 04/13/22 0451 04/14/22 0458 04/22/22 1316  NA 138 137 135 138 134  K 5.2* 4.6 5.0 4.7 4.1  CL 113* 108 104 111 98  CO2 19* 15* 20* 21* 20  GLUCOSE 106* 93 174* 104* 102*  BUN 84* 85* 82* 77* 18  CALCIUM 10.0 10.0 8.9 8.8* 8.7  CREATININE 4.71* 4.68* 3.84* 3.16* 1.23  GFRNONAA 13* 13* 16* 21*  --     LIVER FUNCTION TESTS: Recent Labs    10/07/21 1020 12/09/21 1026 02/17/22 0959 03/06/22 1342 04/10/22 1254  BILITOT 0.4 0.6 0.5 0.4 1.1  AST 32 20 32 26 32  ALT 23 13 38 32 22  ALKPHOS  91  --   --   --  94  PROT 8.7* 7.4 7.3 6.8 8.7*  ALBUMIN 3.7  --   --   --  3.3*    TUMOR MARKERS: No results for input(s): "AFPTM", "CEA", "CA199", "CHROMGRNA" in the last 8760 hours.  Assessment and Plan:  Left renal mass  Will proceed with image guided biopsy of the left renal mass today by Dr. Earleen Newport.  Risks and benefits of renal mass biopsy was discussed with the patient and/or patient's family including, but not limited to bleeding, infection, damage to adjacent structures or low yield requiring additional  tests.  All of the questions were answered and there is agreement to proceed.  Consent signed and in chart.  Thank you for allowing our service to participate in Zael Shuman. 's care.  Electronically Signed: Murrell Redden, PA-C   05/06/2022, 9:40 AM      I spent a total of  30 Minutes   in face to face in clinical consultation, greater than 50% of which was counseling/coordinating care for left renal mass biopsy.

## 2022-05-06 ENCOUNTER — Ambulatory Visit
Admission: RE | Admit: 2022-05-06 | Discharge: 2022-05-06 | Disposition: A | Discharge status: Home / Self Care | Payer: Medicare PPO | Source: Ambulatory Visit | Attending: Urology | Admitting: Urology

## 2022-05-06 ENCOUNTER — Ambulatory Visit: Payer: Medicare PPO

## 2022-05-06 ENCOUNTER — Ambulatory Visit: Payer: Medicare HMO | Admitting: Physician Assistant

## 2022-05-06 ENCOUNTER — Other Ambulatory Visit: Payer: Self-pay | Admitting: Urology

## 2022-05-06 ENCOUNTER — Other Ambulatory Visit: Payer: Self-pay

## 2022-05-06 DIAGNOSIS — R972 Elevated prostate specific antigen [PSA]: Secondary | ICD-10-CM | POA: Insufficient documentation

## 2022-05-06 DIAGNOSIS — Z79899 Other long term (current) drug therapy: Secondary | ICD-10-CM

## 2022-05-06 DIAGNOSIS — M24521 Contracture, right elbow: Secondary | ICD-10-CM

## 2022-05-06 DIAGNOSIS — M5136 Other intervertebral disc degeneration, lumbar region: Secondary | ICD-10-CM

## 2022-05-06 DIAGNOSIS — C642 Malignant neoplasm of left kidney, except renal pelvis: Secondary | ICD-10-CM | POA: Insufficient documentation

## 2022-05-06 DIAGNOSIS — R768 Other specified abnormal immunological findings in serum: Secondary | ICD-10-CM

## 2022-05-06 DIAGNOSIS — E559 Vitamin D deficiency, unspecified: Secondary | ICD-10-CM

## 2022-05-06 DIAGNOSIS — M24522 Contracture, left elbow: Secondary | ICD-10-CM

## 2022-05-06 DIAGNOSIS — C649 Malignant neoplasm of unspecified kidney, except renal pelvis: Secondary | ICD-10-CM | POA: Diagnosis present

## 2022-05-06 DIAGNOSIS — I10 Essential (primary) hypertension: Secondary | ICD-10-CM

## 2022-05-06 DIAGNOSIS — D702 Other drug-induced agranulocytosis: Secondary | ICD-10-CM

## 2022-05-06 DIAGNOSIS — N2889 Other specified disorders of kidney and ureter: Secondary | ICD-10-CM

## 2022-05-06 DIAGNOSIS — Z7902 Long term (current) use of antithrombotics/antiplatelets: Secondary | ICD-10-CM | POA: Insufficient documentation

## 2022-05-06 DIAGNOSIS — Z8673 Personal history of transient ischemic attack (TIA), and cerebral infarction without residual deficits: Secondary | ICD-10-CM | POA: Insufficient documentation

## 2022-05-06 DIAGNOSIS — M48061 Spinal stenosis, lumbar region without neurogenic claudication: Secondary | ICD-10-CM

## 2022-05-06 DIAGNOSIS — M24561 Contracture, right knee: Secondary | ICD-10-CM

## 2022-05-06 DIAGNOSIS — M0579 Rheumatoid arthritis with rheumatoid factor of multiple sites without organ or systems involvement: Secondary | ICD-10-CM

## 2022-05-06 LAB — PROTIME-INR
INR: 1 (ref 0.8–1.2)
Prothrombin Time: 13.4 seconds (ref 11.4–15.2)

## 2022-05-06 MED ORDER — FENTANYL CITRATE (PF) 100 MCG/2ML IJ SOLN
INTRAMUSCULAR | Status: AC | PRN
  Administered 2022-05-06: 50 ug via INTRAVENOUS
  Administered 2022-05-06: 25 ug via INTRAVENOUS

## 2022-05-06 MED ORDER — MIDAZOLAM HCL 2 MG/2ML IJ SOLN
INTRAMUSCULAR | Status: AC
  Filled 2022-05-06: qty 2

## 2022-05-06 MED ORDER — FENTANYL CITRATE (PF) 100 MCG/2ML IJ SOLN
INTRAMUSCULAR | Status: AC
  Filled 2022-05-06: qty 2

## 2022-05-06 MED ORDER — SODIUM CHLORIDE 0.9 % IV SOLN: INTRAVENOUS | Status: DC

## 2022-05-06 MED ORDER — MIDAZOLAM HCL 5 MG/5ML IJ SOLN
INTRAMUSCULAR | Status: AC | PRN
  Administered 2022-05-06: 1 mg via INTRAVENOUS
  Administered 2022-05-06: .5 mg via INTRAVENOUS

## 2022-05-06 NOTE — Procedures (Signed)
Interventional Radiology Procedure Note  Procedure: CT guided bx of left renal mass.  Complications: None EBL: None Recommendations: - Bedrest 2 hours.   - Routine wound care - Follow up pathology - Advance diet   Signed,  Corrie Mckusick, DO

## 2022-05-06 NOTE — Progress Notes (Signed)
Patient clinically stable post Renal CT biopsy per DR Earleen Newport, tolerated well, vitals stable pre and post procedure. Received Versed 1.5 mg along with Fentanyl 75 mcg IV for procedure. Report given to [facility] Rn post procedure/specials.

## 2022-05-07 ENCOUNTER — Other Ambulatory Visit: Payer: Medicare HMO

## 2022-05-07 ENCOUNTER — Ambulatory Visit: Payer: Medicare HMO | Admitting: Oncology

## 2022-05-07 LAB — SURGICAL PATHOLOGY

## 2022-05-10 HISTORY — PX: RENAL BIOPSY: SHX156

## 2022-05-14 ENCOUNTER — Encounter: Payer: Self-pay | Admitting: Physician Assistant

## 2022-05-14 ENCOUNTER — Ambulatory Visit: Payer: Medicare PPO | Attending: Physician Assistant | Admitting: Physician Assistant

## 2022-05-14 ENCOUNTER — Telehealth: Payer: Self-pay | Admitting: Urology

## 2022-05-14 VITALS — BP 132/72 | HR 71 | Resp 15 | Ht 71.0 in | Wt 179.8 lb

## 2022-05-14 DIAGNOSIS — R768 Other specified abnormal immunological findings in serum: Secondary | ICD-10-CM

## 2022-05-14 DIAGNOSIS — M24561 Contracture, right knee: Secondary | ICD-10-CM | POA: Diagnosis not present

## 2022-05-14 DIAGNOSIS — D702 Other drug-induced agranulocytosis: Secondary | ICD-10-CM | POA: Diagnosis not present

## 2022-05-14 DIAGNOSIS — Z79899 Other long term (current) drug therapy: Secondary | ICD-10-CM | POA: Diagnosis not present

## 2022-05-14 DIAGNOSIS — M48061 Spinal stenosis, lumbar region without neurogenic claudication: Secondary | ICD-10-CM

## 2022-05-14 DIAGNOSIS — E559 Vitamin D deficiency, unspecified: Secondary | ICD-10-CM

## 2022-05-14 DIAGNOSIS — I10 Essential (primary) hypertension: Secondary | ICD-10-CM | POA: Diagnosis not present

## 2022-05-14 DIAGNOSIS — M0579 Rheumatoid arthritis with rheumatoid factor of multiple sites without organ or systems involvement: Secondary | ICD-10-CM | POA: Diagnosis not present

## 2022-05-14 DIAGNOSIS — M24521 Contracture, right elbow: Secondary | ICD-10-CM | POA: Diagnosis not present

## 2022-05-14 DIAGNOSIS — M24522 Contracture, left elbow: Secondary | ICD-10-CM

## 2022-05-14 DIAGNOSIS — M24562 Contracture, left knee: Secondary | ICD-10-CM

## 2022-05-14 DIAGNOSIS — M5136 Other intervertebral disc degeneration, lumbar region: Secondary | ICD-10-CM

## 2022-05-14 DIAGNOSIS — N2889 Other specified disorders of kidney and ureter: Secondary | ICD-10-CM

## 2022-05-14 MED ORDER — PREDNISONE 5 MG PO TABS: 10.0000 mg | ORAL_TABLET | Freq: Every day | ORAL | 2 refills | Status: DC

## 2022-05-14 NOTE — Telephone Encounter (Signed)
Spoke with patient wife regarding results.  She had many questions so appointment scheduled to consult.

## 2022-05-14 NOTE — Telephone Encounter (Signed)
Pt wife is called to find out the results of the Biopsy 9/27.  What is the next step? Please call Solmon Ice [phone]

## 2022-05-14 NOTE — Telephone Encounter (Signed)
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  We need to get him established with a nephrologist as discussed in the office before we can move forward with any consideration of surgery as per our discussion.    I think our follow-up timeline is appropriate to have all of these things happen as well as time to recheck the PSA.    Hollice Espy, MD

## 2022-05-18 DIAGNOSIS — N179 Acute kidney failure, unspecified: Secondary | ICD-10-CM | POA: Diagnosis not present

## 2022-05-18 DIAGNOSIS — C642 Malignant neoplasm of left kidney, except renal pelvis: Secondary | ICD-10-CM | POA: Diagnosis not present

## 2022-05-18 DIAGNOSIS — I10 Essential (primary) hypertension: Secondary | ICD-10-CM | POA: Diagnosis not present

## 2022-05-27 ENCOUNTER — Ambulatory Visit: Payer: Medicare PPO | Admitting: Urology

## 2022-05-27 ENCOUNTER — Ambulatory Visit (INDEPENDENT_AMBULATORY_CARE_PROVIDER_SITE_OTHER): Payer: Medicare PPO | Admitting: Urology

## 2022-05-27 ENCOUNTER — Ambulatory Visit
Admission: RE | Admit: 2022-05-27 | Discharge: 2022-05-27 | Disposition: A | Discharge status: Home / Self Care | Payer: Medicare PPO | Source: Ambulatory Visit | Attending: Urology | Admitting: Urology

## 2022-05-27 VITALS — BP 128/71 | HR 89 | Ht 71.0 in | Wt 182.0 lb

## 2022-05-27 DIAGNOSIS — R109 Unspecified abdominal pain: Secondary | ICD-10-CM

## 2022-05-27 DIAGNOSIS — N179 Acute kidney failure, unspecified: Secondary | ICD-10-CM

## 2022-05-27 DIAGNOSIS — R0789 Other chest pain: Secondary | ICD-10-CM | POA: Diagnosis present

## 2022-05-27 DIAGNOSIS — N2889 Other specified disorders of kidney and ureter: Secondary | ICD-10-CM | POA: Insufficient documentation

## 2022-05-27 DIAGNOSIS — R911 Solitary pulmonary nodule: Secondary | ICD-10-CM | POA: Diagnosis not present

## 2022-05-27 DIAGNOSIS — C642 Malignant neoplasm of left kidney, except renal pelvis: Secondary | ICD-10-CM

## 2022-05-27 LAB — URINALYSIS, COMPLETE
Bilirubin, UA: NEGATIVE
Glucose, UA: NEGATIVE
Ketones, UA: NEGATIVE
Leukocytes,UA: NEGATIVE
Nitrite, UA: NEGATIVE
Protein,UA: NEGATIVE
RBC, UA: NEGATIVE
Specific Gravity, UA: 1.01 (ref 1.005–1.030)
Urobilinogen, Ur: 1 mg/dL (ref 0.2–1.0)
pH, UA: 6 (ref 5.0–7.5)

## 2022-05-27 LAB — MICROSCOPIC EXAMINATION

## 2022-05-27 LAB — BLADDER SCAN AMB NON-IMAGING

## 2022-05-27 NOTE — Progress Notes (Unsigned)
05/27/2022 5:15 PM   Michael Bracken Jr. 1951-04-11 [id number]  Referring provider: Danelle Berry, NP [address] [place],  [place] [address]  Urological history: 1. Left renal cell carcinoma -Incidental finding during hospital admission for AKI -mass of the posterior lip of the right kidney extending into the renal sinus, measuring 5.3 x 4.3 cm. 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 Elevated PSA  -PSA (03/2022) 5.9 in the setting of UTI, medical illness and hospital admission  3. AKI -has been cleared by nephrology to undergo radical left nephrectomy.    Chief Complaint  Patient presents with   Flank Pain    HPI: Michael Hinton. is a 71 y.o. male who presents today for left sided flank and left chest wall pain with his wife, Felecia.    He had a renal biopsy on the left May 06, 2022.  He did not had any difficulty with the biopsy.  He did not have any issues after the biopsy.  He states for the last 2 days he has been having left-sided pain that is present when he is breathing.  He also had some pain last night during sleeping.  He he has not had a cough or congestion.  He has not had gross hematuria.    He feels it may be gas.   Patient denies any modifying or aggravating factors.  Patient denies any dysuria or suprapubic.  Patient denies any fevers, chills, nausea or vomiting.    UA few bacteria.    PVR 168 mL   KUB w/constipation  Chest X ray rounded density of the lower left chest  PMH: Past Medical History:  Diagnosis Date   Anemia    High risk medication use 06/02/2016   Hypertension 06/02/2016   Neutropenia (HCC)    Prediabetes    Rheumatoid arthritis involving multiple sites with positive rheumatoid factor ([place]) 06/02/2016    Surgical History: No past surgical history on file.  Home Medications:  Allergies as of 05/27/2022   No Known  Allergies      Medication List        Accurate as of May 27, 2022 11:59 PM. If you have any questions, ask your nurse or doctor.          amLODipine 10 MG tablet Commonly known as: NORVASC Take 10 mg by mouth every morning.   clopidogrel 75 MG tablet Commonly known as: PLAVIX Take 75 mg by mouth daily.   folic acid 1 MG tablet Commonly known as: FOLVITE Take 1 tablet (1 mg total) by mouth daily.   IRON PO Take 1 tablet by mouth daily.   mirtazapine 7.5 MG tablet Commonly known as: REMERON Take 7.5 mg by mouth at bedtime.   polyethylene glycol 17 g packet Commonly known as: MIRALAX / GLYCOLAX Take 17 g by mouth daily as needed.   predniSONE 5 MG tablet Commonly known as: DELTASONE Take 1 tablet (5 mg total) by mouth daily with breakfast.   predniSONE 5 MG tablet Commonly known as: DELTASONE Take 2 tablets (10 mg total) by mouth daily with breakfast.   rosuvastatin 40 MG tablet Commonly known as: CRESTOR Take 40 mg by mouth daily.   sodium bicarbonate 650 MG tablet Take 1 tablet (650 mg total) by mouth 2 (two) times daily.   Vitamin D-3 125 MCG (5000 UT) Tabs Take 1 tablet by mouth daily.  Allergies: No Known Allergies  Family History: Family History  Problem Relation Age of Onset   Heart disease Mother    Diabetes Mother    Breast cancer Mother    Kidney disease Father    Diabetes Father    Diabetes Daughter     Social History:  reports that he quit smoking about 44 years ago. His smoking use included cigarettes. He has a 3.50 pack-year smoking history. He has been exposed to tobacco smoke. He has never used smokeless tobacco. He reports that he does not currently use alcohol. He reports that he does not use drugs.  ROS: Pertinent ROS in HPI  Physical Exam: BP 128/71   Pulse 89   Ht '5\' 11"'$  (1.803 m)   Wt 182 lb (82.6 kg)   BMI 25.38 kg/m   Constitutional:  Well nourished. Alert and oriented, No acute distress. HEENT: [place] AT,  moist mucus membranes.  Trachea midline Cardiovascular: No clubbing, cyanosis, or edema. Respiratory: Normal respiratory effort, no increased work of breathing. GU: No CVA tenderness.  No bladder fullness or masses.  No left flank ecchymosis.   Neurologic: Grossly intact, no focal deficits, moving all 4 extremities. Psychiatric: Normal mood and affect.  Laboratory Data: Lab Results  Component Value Date   WBC 4.1 04/13/2022   HGB 7.9 (L) 04/13/2022   HCT 24.9 (L) 04/13/2022   MCV 85.3 04/13/2022   PLT 157 04/13/2022    Lab Results  Component Value Date   CREATININE 1.23 04/22/2022    Lab Results  Component Value Date   AST 32 04/10/2022   Lab Results  Component Value Date   ALT 22 04/10/2022    Urinalysis See Epic and HPI I have reviewed the labs.   Pertinent Imaging: CLINICAL DATA:  Left chest wall pain   EXAM: CHEST - 2 VIEW   COMPARISON:  Chest x-ray dated April 10, 2022   FINDINGS: Heart size and mediastinal contours are within normal limits. Indeterminate rounded density on AP view overlying the inferior left scapula and interspace of the anterior 5th and 5th ribs. Lungs are otherwise clear. No pleural effusion or pneumothorax.   IMPRESSION: 1. Indeterminate rounded density of the lower left chest. Recommend dedicated left rib and shoulder radiographs for further evaluation. Alternatively, chest CT could be performed. 2. Otherwise no acute cardiopulmonary process.     Electronically Signed   By: Yetta Glassman M.D.   On: 05/28/2022 16:31   Narrative & Impression  CLINICAL DATA:  Known left renal mass with left flank and chest wall pain.   EXAM: ABDOMEN - 1 VIEW   COMPARISON:  None Available.   FINDINGS: The bowel gas pattern is normal. Moderate volume stool within the colon. No radio-opaque calculi or other significant radiographic abnormality are seen.   IMPRESSION: Nonobstructive bowel gas pattern. Moderate volume stool within  the colon.     Electronically Signed   By: Darrin Nipper M.D.   On: 05/28/2022 16:22    I have independently reviewed the films.    Assessment & Plan:    1. Left flank pain/Left chest wall pain -KUB w/ findings of constipation -advised him to take a Doculax to move the stool out of the bowel to see if that relieved the pain  2. Left renal cell carcinoma -has an appointment w/ Dr. Erlene Quan for further discussion on 06/03/2022  Return for keep follow up w/Dr. Erlene Quan .  These notes generated with voice recognition software. I apologize for typographical errors.  Royden Purl  [facility] [address]  [place], [place] [address] [phone]   Addendum: I contacted radiology this afternoon to get a read on his x-rays, specifically the chest x-ray and it noted a indeterminate rounded density in the left lower lung.  Radiology has recommended a chest CT.  I went ahead and placed the order as my differential is a possible empyema, possible metastatic disease (though unlikely), lung malignancy or a benign incidental finding.

## 2022-05-28 ENCOUNTER — Encounter: Payer: Self-pay | Admitting: Urology

## 2022-05-28 ENCOUNTER — Telehealth: Payer: Self-pay | Admitting: Urology

## 2022-05-28 NOTE — Telephone Encounter (Signed)
I left a message with the [facility] to contact me regarding the chest CT results.  There was an indeterminate rounded density in the left lower chest, so I went ahead and put orders in for a chest CT so that we could further evaluate this area and make sure it is not an infection or something else that needs to be addressed.  I have notified Sharyn Lull and she will work on getting it approved and getting it scheduled tomorrow.  I will not be in the office tomorrow or Monday.  They have an upcoming appointment with Dr. Erlene Quan on Wednesday, so I hope is the CT scan will be completed before that day.

## 2022-05-29 DIAGNOSIS — N281 Cyst of kidney, acquired: Secondary | ICD-10-CM

## 2022-05-29 HISTORY — DX: Cyst of kidney, acquired: N28.1

## 2022-05-30 LAB — CULTURE, URINE COMPREHENSIVE

## 2022-06-01 ENCOUNTER — Ambulatory Visit
Admission: RE | Admit: 2022-06-01 | Discharge: 2022-06-01 | Disposition: A | Discharge status: Home / Self Care | Payer: Medicare PPO | Source: Ambulatory Visit | Attending: Urology | Admitting: Urology

## 2022-06-01 DIAGNOSIS — R911 Solitary pulmonary nodule: Secondary | ICD-10-CM | POA: Diagnosis present

## 2022-06-01 DIAGNOSIS — I7121 Aneurysm of the ascending aorta, without rupture: Secondary | ICD-10-CM

## 2022-06-01 HISTORY — DX: Aneurysm of the ascending aorta, without rupture: I71.21

## 2022-06-03 ENCOUNTER — Ambulatory Visit (INDEPENDENT_AMBULATORY_CARE_PROVIDER_SITE_OTHER): Payer: Medicare PPO | Admitting: Urology

## 2022-06-03 ENCOUNTER — Encounter: Payer: Self-pay | Admitting: Urology

## 2022-06-03 VITALS — BP 126/67 | HR 78 | Ht 71.0 in | Wt 182.0 lb

## 2022-06-03 DIAGNOSIS — C642 Malignant neoplasm of left kidney, except renal pelvis: Secondary | ICD-10-CM | POA: Diagnosis not present

## 2022-06-03 DIAGNOSIS — N179 Acute kidney failure, unspecified: Secondary | ICD-10-CM

## 2022-06-03 DIAGNOSIS — Z87898 Personal history of other specified conditions: Secondary | ICD-10-CM

## 2022-06-03 DIAGNOSIS — R972 Elevated prostate specific antigen [PSA]: Secondary | ICD-10-CM | POA: Diagnosis not present

## 2022-06-03 NOTE — Progress Notes (Signed)
06/03/2022 10:01 AM   Michael Hinton. 1950/12/11 [id number]  Referring provider: Danelle Berry, NP [place],  [place] [address]  Chief Complaint  Patient presents with   Results    HPI: 71 year old male with incidental left renal mass renal mass who returns today s/p biopsy.  He underwent uncomplicated renal mass biopsy on 05/06/2022 which shows evidence of renal cell carcinoma with clear cell features.  Grade was not assigned.  MRI without contrast that showed a heterogeneous mass involving the posterior lip of the left kidney extending into the renal sinus measuring 5.3 x 4.3 cm.  In the interim, his creatinine has trended back down to baseline, 0.98.  He is followed by nephrology.  He has been cleared for nephrectomy.  No previous abdominal surgeries.  He also has a personal history of elevated PSA (7.2) pending repeat in 12/23.     PMH: Past Medical History:  Diagnosis Date   Anemia    High risk medication use 06/02/2016   Hypertension 06/02/2016   Neutropenia ([place])    Prediabetes    Rheumatoid arthritis involving multiple sites with positive rheumatoid factor ([place]) 06/02/2016    Surgical History: No past surgical history on file.  Home Medications:  Allergies as of 06/03/2022   No Known Allergies      Medication List        Accurate as of June 03, 2022 11:59 PM. If you have any questions, ask your nurse or doctor.          amLODipine 10 MG tablet Commonly known as: NORVASC Take 10 mg by mouth every morning.   clopidogrel 75 MG tablet Commonly known as: PLAVIX Take 75 mg by mouth daily.   folic acid 1 MG tablet Commonly known as: FOLVITE Take 1 tablet (1 mg total) by mouth daily.   IRON PO Take 1 tablet by mouth daily.   mirtazapine 7.5 MG tablet Commonly known as: REMERON Take 7.5 mg by mouth at bedtime.   polyethylene glycol 17 g packet Commonly known as: MIRALAX / GLYCOLAX Take 17 g by mouth daily as  needed.   predniSONE 5 MG tablet Commonly known as: DELTASONE Take 2 tablets (10 mg total) by mouth daily with breakfast.   rosuvastatin 40 MG tablet Commonly known as: CRESTOR Take 40 mg by mouth daily.   sodium bicarbonate 650 MG tablet Take 1 tablet (650 mg total) by mouth 2 (two) times daily.   Vitamin D-3 125 MCG (5000 UT) Tabs Take 1 tablet by mouth daily.        Allergies: No Known Allergies  Family History: Family History  Problem Relation Age of Onset   Heart disease Mother    Diabetes Mother    Breast cancer Mother    Kidney disease Father    Diabetes Father    Diabetes Daughter     Social History:  reports that he quit smoking about 44 years ago. His smoking use included cigarettes. He has a 3.50 pack-year smoking history. He has been exposed to tobacco smoke. He has never used smokeless tobacco. He reports that he does not currently use alcohol. He reports that he does not use drugs.   Physical Exam: BP 126/67   Pulse 78   Ht '5\' 11"'$  (1.803 m)   Wt 182 lb (82.6 kg)   BMI 25.38 kg/m   Constitutional:  Alert and oriented, No acute distress.  Accompanied by his wife today. Respiratory: Normal respiratory effort, no increased work  of breathing. GI: Abdomen is soft, nontender, nondistended, no abdominal masses Skin: No rashes, bruises or suspicious lesions. Neurologic: Grossly intact, no focal deficits, moving all 4 extremities. Psychiatric: Normal mood and affect.  Laboratory Data: Lab Results  Component Value Date   WBC 4.1 04/13/2022   HGB 7.9 (L) 04/13/2022   HCT 24.9 (L) 04/13/2022   MCV 85.3 04/13/2022   PLT 157 04/13/2022    Lab Results  Component Value Date   CREATININE 1.23 04/22/2022     Assessment & Plan:    1. Renal cell cancer, left (HCC) Left renal mass, biopsy proven RCC with evidence of metastatic disease  We discussed options including continued surveillance versus minimally invasive nephrectomy.  Based on the location of  the tumor, he is not a candidate for partial nephrectomy.  Risk of surgery including risk of bleeding, life-threatening bleeding, heart attack stroke, blood clot, infection, damage surrounding structures, hernia formation, ileus amongst others were discussed.  In particular, he is a risk of progression to end-stage renal disease especially in light of his recent episode of AKI.  Being followed by nephrology and has been cleared for surgery, anticipate postprocedural GFR in the 30-40 range.  He has elected to proceed with surgery.   2. History of elevated PSA Recheck PSA in 12/23, consider biopsy based on results  Will address #1 first  3. AKI (acute kidney injury) ([place]) As above    Hollice Espy, MD  [facility] [address], [place], [place] [address] [phone]  I spent 45 total minutes on the day of the encounter including pre-visit review of the medical record, face-to-face time with the patient, and post visit ordering of labs/imaging/tests.

## 2022-06-05 ENCOUNTER — Other Ambulatory Visit: Payer: Self-pay | Admitting: Urology

## 2022-06-05 DIAGNOSIS — C642 Malignant neoplasm of left kidney, except renal pelvis: Secondary | ICD-10-CM

## 2022-06-05 NOTE — Progress Notes (Signed)
Surgical Physician Order Form [facility] [place]  * Scheduling expectation : Next Available  *Length of Case:   *Clearance needed: yes' PCP to hold plavix  *Anticoagulation Instructions: Hold all anticoagulants  *Aspirin Instructions: Hold Aspirin  *Post-op visit Date/Instructions:   4-6 weeks  *Diagnosis: RCC  *Procedure: left Laparoscopic radical nephrectomy (robot or hand assist)([address])   Additional orders: N/A  -Admit type: INpatient  -Anesthesia: General  -VTE Prophylaxis Standing Order SCD's       Other:   -Standing Lab Orders Per Anesthesia    Lab other: UA/ UCx, CBC, BMP, INR, T&C x 2  -Standing Test orders EKG/Chest x-ray per Anesthesia       Test other:   - Medications:  Ancef 2gm IV  -Other orders:  N/A

## 2022-06-15 ENCOUNTER — Telehealth: Payer: Self-pay | Admitting: Urology

## 2022-06-15 NOTE — Telephone Encounter (Signed)
Would you call the [facility] and make sure they have discussed the CT of the chest result with their PCP?  I gave Mrs. Michael Hinton a copy and also faxed the results over to their PCP and have not heard anything back.

## 2022-06-15 NOTE — Telephone Encounter (Signed)
Spoke to patient and she states she has not spoke to PCP. She was waiting to do the surgery on the 4th of December. She states there is a lot going on.

## 2022-06-16 ENCOUNTER — Telehealth: Payer: Self-pay

## 2022-06-16 NOTE — Progress Notes (Signed)
[facility] Posting Form   Surgery Date/Time: Date: 07/13/2022  Surgeon: Dr. Hollice Espy, MD  Surgery Location: Day Surgery  Inpt ( Yes  )   Outpt (No)   Obs ( No  )   Diagnosis: Renal Cell Cancer C64.2  -CPT: [phone]  Surgery: Left Hand Assisted Laparoscopic Radical Nephrectomy  Stop Anticoagulations: Yes, will need to hold plavix and ASA  Cardiac/Medical/Pulmonary Clearance needed: yes  Clearance needed from Dr: Clayborn Bigness  Clearance request sent on: Date: 06/16/22  *Orders entered into EPIC  Date: 06/16/22   *Case booked in EPIC  Date: 06/11/2022  *Notified pt of Surgery: Date: 06/11/2022  PRE-OP UA & CX: Yes, will also obtain CBC, BMP, T&C x 2, INR  *Placed into Prior Authorization Work [place] Date: 06/16/22  Assistant/laser/rep:Yes, Dr. Nickolas Madrid MD to assist.

## 2022-06-16 NOTE — Telephone Encounter (Signed)
I spoke with [name] and his wife. We have discussed possible surgery dates and Monday December 4th, 2023 was agreed upon by all parties. Patient given information about surgery date, what to expect pre-operatively and post operatively.  We discussed that a Pre-Admission Testing office will be calling to set up the pre-op visit that will take place prior to surgery, and that these appointments are typically done over the phone with a Pre-Admissions RN.  Informed patient that our office will communicate any additional care to be provided after surgery. Patients questions or concerns were discussed during our call. Advised to call our office should there be any additional information, questions or concerns that arise. Patient verbalized understanding.

## 2022-06-16 NOTE — Progress Notes (Signed)
REQUEST FOR SURGICAL CLEARANCE       Date: Date: 06/16/22  Faxed to: Dr. Clayborn Bigness  Surgeon: Dr. Hollice Espy, MD     Date of Surgery: Monday December 4th, 2023  Operation:  Left Hand Assisted Laparoscopic Radical Nephrectomy   Anesthesia Type: General   Diagnosis: Left Renal Cell Cancer  Patient Requires:   Cardiac / Vascular Clearance : Yes  Reason: Would like for patient to hold Plavix and ASA prior to surgery. How many days would you advise?   Risk Assessment:    Low   '[]'$       Moderate   '[]'$     High   '[]'$           This patient is optimized for surgery  YES '[]'$       NO   '[]'$    I recommend further assessment/workup prior to surgery. YES '[]'$      NO  '[]'$   Appointment scheduled for: _______________________   Further recommendations: ____________________________________     Physician Signature:__________________________________   Printed Name: ________________________________________   Date: _________________

## 2022-07-06 ENCOUNTER — Encounter
Admission: RE | Admit: 2022-07-06 | Discharge: 2022-07-06 | Disposition: A | Discharge status: Home / Self Care | Payer: Medicare PPO | Source: Ambulatory Visit | Attending: Urology | Admitting: Urology

## 2022-07-06 NOTE — Patient Instructions (Addendum)
Your procedure is scheduled on: Monday July 13, 2022. Report to Day Surgery inside [facility] 2nd floor, stop by admissions desk before getting on elevator.  To find out your arrival time please call [phone] between 1PM - 3PM on Friday July 10, 2022.  Remember: Instructions that are not followed completely may result in serious medical risk,  up to and including death, or upon the discretion of your surgeon and anesthesiologist your  surgery may need to be rescheduled.     _X__ 1. Do not eat food or drink fluids after midnight the night before your procedure.                 No chewing gum or hard candies.   __X__2.  On the morning of surgery brush your teeth with toothpaste and water, you                may rinse your mouth with mouthwash if you wish.  Do not swallow any toothpaste or mouthwash.     _X__ 3.  No Alcohol for 24 hours before or after surgery.   _X__ 4.  Do Not Smoke or use e-cigarettes For 24 Hours Prior to Your Surgery.                 Do not use any chewable tobacco products for at least 6 hours prior to                 Surgery.  _X__  5.  Do not use any recreational drugs (marijuana, cocaine, heroin, ecstasy, MDMA or other)                For at least one week prior to your surgery.  Combination of these drugs with anesthesia                May have life threatening results.  ____  6.  Bring all medications with you on the day of surgery if instructed.   _X__  7.  Notify your doctor if there is any change in your medical condition      (cold, fever, infections).     Do not wear jewelry, make-up, hairpins, clips or nail polish. Do not wear lotions, powders, or perfumes. You may wear deodorant. Do not shave 48 hours prior to surgery. Men may shave face and neck. Do not bring valuables to the hospital.    [facility] is not responsible for any belongings or valuables.  Contacts, dentures or bridgework may not be worn into  surgery. Leave your suitcase in the car. After surgery it may be brought to your room. For patients admitted to the hospital, discharge time is determined by your treatment team.   Patients discharged the day of surgery will not be allowed to drive home.   Make arrangements for someone to be with you for the first 24 hours of your Same Day Discharge.  __X__ Take these medicines the morning of surgery with A SIP OF WATER:    1. amLODipine (NORVASC) 10 MG   2.   3.   4.  5.  6.  ____ Fleet Enema (as directed)   __X__ Use CHG Soap (or wipes) as directed  ____ Use Benzoyl Peroxide Gel as instructed  ____ Use inhalers on the day of surgery  ____ Stop metformin 2 days prior to surgery    ____ Take 1/2 of usual insulin dose the night before surgery. No insulin the morning  of surgery.   __X__  Stop clopidogrel (PLAVIX) 75 MG 5 days before your procedure and resume 2 days after your surgery as instructed by your doctor.  __X__ One Week prior to surgery- Stop Anti-inflammatories such as Ibuprofen, Aleve, Advil, Motrin, meloxicam (MOBIC), diclofenac, etodolac, ketorolac, Toradol, Daypro, piroxicam, Goody's or BC powders. OK TO USE TYLENOL IF NEEDED   _X___ Do no start any new vitamins and or supplements until after surgery.    ____ Bring C-Pap to the hospital.    If you have any questions regarding your pre-procedure instructions,  Please call Pre-admit Testing at [phone]     Preparing for Surgery with CHLORHEXIDINE GLUCONATE (CHG) Soap  Chlorhexidine Gluconate (CHG) Soap  o An antiseptic cleaner that kills germs and bonds with the skin to continue killing germs even after washing  o Used for showering the night before surgery and morning of surgery  Before surgery, you can play an important role by reducing the number of germs on your skin.  CHG (Chlorhexidine gluconate) soap is an antiseptic cleanser which kills germs and bonds with the skin to continue  killing germs even after washing.  Please do not use if you have an allergy to CHG or antibacterial soaps. If your skin becomes reddened/irritated stop using the CHG.  1. Shower the NIGHT BEFORE SURGERY and the MORNING OF SURGERY with CHG soap.  2. If you choose to wash your hair, wash your hair first as usual with your normal shampoo.  3. After shampooing, rinse your hair and body thoroughly to remove the shampoo.  4. Use CHG as you would any other liquid soap. You can apply CHG directly to the skin and wash gently with a scrungie or a clean washcloth.  5. Apply the CHG soap to your body only from the neck down. Do not use on open wounds or open sores. Avoid contact with your eyes, ears, mouth, and genitals (private parts). Wash face and genitals (private parts) with your normal soap.  6. Wash thoroughly, paying special attention to the area where your surgery will be performed.  7. Thoroughly rinse your body with warm water.  8. Do not shower/wash with your normal soap after using and rinsing off the CHG soap.  9. Pat yourself dry with a clean towel.  10. Wear clean pajamas to bed the night before surgery.  12. Place clean sheets on your bed the night of your first shower and do not sleep with pets.  13. Shower again with the CHG soap on the day of surgery prior to arriving at the hospital.  14. Do not apply any deodorants/lotions/powders.  15. Please wear clean clothes to the hospital.

## 2022-07-06 NOTE — Progress Notes (Signed)
Cardiac clearance form from Dr. Clayborn Bigness placed on the chart. Risk assessment is low and patient optimized for nephrectomy surgery on 07/13/2022. Patient to stop Plavix and aspirin 5 days prior to surgery and restart 2 days after surgery.

## 2022-07-07 ENCOUNTER — Encounter
Admission: RE | Admit: 2022-07-07 | Discharge: 2022-07-07 | Disposition: A | Discharge status: Home / Self Care | Payer: Medicare PPO | Source: Ambulatory Visit | Attending: Urology | Admitting: Urology

## 2022-07-07 DIAGNOSIS — Z01812 Encounter for preprocedural laboratory examination: Secondary | ICD-10-CM | POA: Diagnosis present

## 2022-07-07 DIAGNOSIS — C642 Malignant neoplasm of left kidney, except renal pelvis: Secondary | ICD-10-CM | POA: Insufficient documentation

## 2022-07-07 LAB — URINALYSIS, COMPLETE (UACMP) WITH MICROSCOPIC
Bacteria, UA: NONE SEEN
Bilirubin Urine: NEGATIVE
Glucose, UA: NEGATIVE mg/dL
Hgb urine dipstick: NEGATIVE
Ketones, ur: NEGATIVE mg/dL
Leukocytes,Ua: NEGATIVE
Nitrite: NEGATIVE
Protein, ur: NEGATIVE mg/dL
Specific Gravity, Urine: 1.017 (ref 1.005–1.030)
WBC, UA: NONE SEEN WBC/hpf (ref 0–5)
pH: 5 (ref 5.0–8.0)

## 2022-07-07 LAB — PROTIME-INR
INR: 1 (ref 0.8–1.2)
Prothrombin Time: 13.3 seconds (ref 11.4–15.2)

## 2022-07-07 LAB — CBC
HCT: 36 % — ABNORMAL LOW (ref 39.0–52.0)
Hemoglobin: 11.3 g/dL — ABNORMAL LOW (ref 13.0–17.0)
MCH: 26.6 pg (ref 26.0–34.0)
MCHC: 31.4 g/dL (ref 30.0–36.0)
MCV: 84.7 fL (ref 80.0–100.0)
Platelets: 222 10*3/uL (ref 150–400)
RBC: 4.25 MIL/uL (ref 4.22–5.81)
RDW: 16.4 % — ABNORMAL HIGH (ref 11.5–15.5)
WBC: 4.2 10*3/uL (ref 4.0–10.5)
nRBC: 0 % (ref 0.0–0.2)

## 2022-07-07 LAB — BASIC METABOLIC PANEL
Anion gap: 8 (ref 5–15)
BUN: 31 mg/dL — ABNORMAL HIGH (ref 8–23)
CO2: 22 mmol/L (ref 22–32)
Calcium: 9.8 mg/dL (ref 8.9–10.3)
Chloride: 105 mmol/L (ref 98–111)
Creatinine, Ser: 1.23 mg/dL (ref 0.61–1.24)
GFR, Estimated: >60 mL/min (ref >60)
Glucose, Bld: 155 mg/dL — ABNORMAL HIGH (ref 70–99)
Potassium: 4 mmol/L (ref 3.5–5.1)
Sodium: 135 mmol/L (ref 135–145)

## 2022-07-08 ENCOUNTER — Encounter: Payer: Self-pay | Admitting: Urgent Care

## 2022-07-08 LAB — URINE CULTURE: Culture: NO GROWTH

## 2022-07-08 NOTE — Progress Notes (Signed)
Perioperative Services  Pre-Admission/Anesthesia Testing Clinical Review  Date: 07/09/22  Patient Demographics:  Name: Michael Hinton. DOB:   12/04/1950 MRN:   [id number]  Planned Surgical Procedure(s):    Case: [id number] Date/Time: 07/13/22 1146   Procedure: HAND ASSISTED LAPAROSCOPIC RADICAL NEPHRECTOMY (Left)   Anesthesia type: General   Pre-op diagnosis: Renal Cell Carcinoma   Location: [facility]   Surgeons: Hollice Espy, MD   NOTE: Available PAT nursing documentation and vital signs have been reviewed. Clinical nursing staff has updated patient's PMH/Hinton, current medication list, and drug allergies/intolerances to ensure comprehensive history available to assist in medical decision making as it pertains to the aforementioned surgical procedure and anticipated anesthetic course. Extensive review of available clinical information performed. Michael Hinton updated with any diagnoses/procedures that  may have been inadvertently omitted during his intake with the pre-admission testing department's nursing staff.  Clinical Discussion:  Michael Hinton. is a 71 y.o. male who is submitted for pre-surgical anesthesia review and clearance prior to him undergoing the above procedure. Patient is a Former Smoker (3.5 pack years; quit 03/1978). Pertinent PMH includes: atrial fibrillation, diastolic dysfunction, TAA, carotid artery disease, aortic atherosclerosis, CVA, HTN, HLD, T2DM, anemia, renal cell carcinoma.  Patient is followed by cardiology Clayborn Bigness, MD). He was last seen in the cardiology clinic on 02/18/2022; notes reviewed. At the time of his clinic visit, the patient denied any chest pain, shortness of breath, PND, orthopnea, palpitations, significant peripheral edema, vertiginous symptoms, or presyncope/syncope.  Patient with a PMH significant for cardiovascular diagnoses.  MRI imaging of the brain performed on 01/14/2022 revealed  numerous chronic cerebellar infarcts. No reported residual deficits noted by patient.  Patient with an atrial fibrillation diagnosis; CHA2DS2-VASc Score = 6 (age, HTN, CVA x 2, vascular disease history, T2DM). His rate and rhythm are currently being maintained without the use of pharmacological intervention.  Patient is chronically anticoagulated using daily clopidogrel.  He is reportedly compliant with therapy with no evidence or reports of GI bleeding.  Patient has a T2DM diagnosis.  His last hemoglobin A1c that I have access to review was 7.2% on 08/15/2018. Patient does not have an OSAH diagnosis. Functional capacity, as defined by DASI, is documented as being >/= 4 METS.  No changes were made to his medication regimen, however further cardiovascular studies were ordered.  Patient to follow-up in 2 months following noninvasive testing for review and ongoing management.  Since being seen by cardiology, patient has been hospitalized again for poor oral intake and significant unintentional weight loss.  At the time of his presentation, patient's creatinine had elevated to 4.72 (baseline 1.1).  CT imaging of the head demonstrated no acute findings, however the small old lacunar infarcts were seen in the cerebellum BILATERALLY.  Patient underwent further testing and specialty consults (see below) before ultimately being discharged in stable condition with plans for outpatient follow-up.  Carotid Doppler performed on 04/11/2022 showed less than 53% LICA stenosis with no significant contralateral disease.  Renal ultrasound performed on 04/10/2022 revealed a 5.2 cm LEFT heterogeneous solid mass in the LEFT kidney.  Follow-up MRI imaging of the abdomen redemonstrated a 5.3 x 4.3 cm mass posterior to the left of the LEFT kidney extending into the renal sinus.  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  Patient was seen in consult by both oncology Tasia Catchings, MD).   CT imaging of  the chest was performed to assess for potential lymphadenopathy or evidence of metastatic disease; none noted.  Patient scheduled to follow-up with both outpatient urology and oncology for continuing care and definitive management once discharged.  TTE performed on 04/11/2022 revealed a normal left ventricular systolic function with an EF of 55-60%. Left ventricular diastolic Doppler parameters consistent with abnormal relaxation (G1DD).  There was mild biatrial enlargement.  Mild mitral valve regurgitation noted.  There was no evidence of any significant transvalvular gradients to suggest stenosis.  Michael Hinton. has been seen in consult by urology.  Repeat CT imaging of the chest was performed on 06/01/2022 remain grossly unchanged, with the exception that this imaging study revealed a 4.2 cm ascending thoracic aortic aneurysm.  Following review of the updated imaging, the decision was made to pursue surgical resection.  Patient has been scheduled for an HAND ASSISTED LAPAROSCOPIC RADICAL NEPHRECTOMY (Left) on 07/13/2022 with Dr. Hollice Espy, MD.  Given patient's past medical history significant for cardiovascular diagnoses, presurgical cardiac clearance was sought by the PAT team. Per cardiology, "this patient is optimized for surgery and may proceed with the planned procedural course with a LOW risk of significant perioperative cardiovascular complications".  Again, this patient is on daily antiplatelet therapy.  He has been instructed on recommendations for holding his clopidogrel for 5 days prior to his procedure with plans to restart since postoperative bleeding risk to be minimized by his primary attending surgeon.  Patient is aware that his last dose of clopidogrel should be on 07/07/2022.  Patient denies previous perioperative complications with anesthesia in the past.  In review his EMR, there are no records available for review pertaining to any recent  procedural/anesthetic  courses within the [facility] system.      07/06/2022   10:49 AM 06/03/2022    2:30 PM 05/27/2022    1:51 PM  Vitals with BMI  Height '5\' 11"'$  '5\' 11"'$  '5\' 11"'$   Weight 175 lbs 182 lbs 182 lbs  BMI 24.42 32.1 22.4  Systolic  825 003  Diastolic  67 71  Pulse  78 89    Providers/Specialists:   NOTE: Primary physician provider listed below. Patient may have been seen by APP or partner within same practice.   PROVIDER ROLE / SPECIALTY LAST Lu Duffel, MD Urology (Surgeon) 06/05/2022  Danelle Berry, NP Primary Care Provider ???  Katrine Coho, MD Cardiology 02/18/2022  Hazel Sams, PA-C Rheumatology 05/14/2022  Anthonette Legato, MD Nephrology 05/18/2022  Earlie Server, MD Medical Oncology 04/12/2022   Allergies:  Patient has no known allergies.  Current Home Medications:   No current facility-administered medications for this encounter.    amLODipine (NORVASC) 10 MG tablet   Cholecalciferol (VITAMIN D-3) 125 MCG (5000 UT) TABS   clopidogrel (PLAVIX) 75 MG tablet   Ferrous Sulfate (IRON PO)   folic acid (FOLVITE) 1 MG tablet   mirtazapine (REMERON) 7.5 MG tablet   polyethylene glycol (MIRALAX / GLYCOLAX) 17 g packet   predniSONE (DELTASONE) 5 MG tablet   rosuvastatin (CRESTOR) 40 MG tablet   sodium bicarbonate 650 MG tablet   History:   Past Medical History:  Diagnosis Date   Anemia    Aortic atherosclerosis (HCC)    Atrial fibrillation ([place])    a.) CHA2DS2VASc = 5 (age, HTN, CVA x 2, vascular disease history);  b.) rate/rhythm maintained without pharmacological intervention; chronically anticoagulated with clopidogrel   Bilateral renal cysts 05/29/2022   Carotid artery disease ([place]) 04/11/2022  a.) doppler 88/28/0034: <91 LICA, no sig RICA.   CVA (cerebral vascular accident) [facility])    a.) MRI brain 01/14/2022: numerous chronic cerebellar infarcts; b.) CT head 04/10/2022: small old lacunar infarcts are seen in cerebellum bilaterally   Diastolic dysfunction  79/15/0569   a.) TTE 04/11/2022: EF 55-60%, mild BAE, mild MR, G1DD.   HLD (hyperlipidemia)    Hypertension 06/02/2016   Long term current use of antithrombotics/antiplatelets    a.) clopidogrel   Long term current use of systemic steroids    a.) prednisone for diagnosis of RA   Neutropenia (HCC)    Renal cell cancer, left ([place]) 04/10/2022   a.) renal US 04/10/2022: solid heterogenous LEFT renal mass measuring 5.2 cm; b.) MRI ABD - 5.3 x 4.3 cm renal mass to posterior lip of LEFT kidney extending into renal sinus; c.)  renal Bx  05/06/2022 - (+) for RCC with clear cell features   Rheumatoid arthritis involving multiple sites with positive rheumatoid factor ([place]) 06/02/2016   T2DM (type 2 diabetes mellitus) ([place])    Thoracic ascending aortic aneurysm ([place]) 06/01/2022   a.) CT chest - measured 4.2 cm   Past Surgical History:  Procedure Laterality Date   RENAL BIOPSY  05/2022   Family History  Problem Relation Age of Onset   Heart disease Mother    Diabetes Mother    Breast cancer Mother    Kidney disease Father    Diabetes Father    Diabetes Daughter    Social History   Tobacco Use   Smoking status: Former    Packs/day: 0.50    Years: 7.00    Total pack years: 3.50    Types: Cigarettes    Quit date: 03/15/1978    Years since quitting: 44.3    Passive exposure: Past   Smokeless tobacco: Never  Vaping Use   Vaping Use: Never used  Substance Use Topics   Alcohol use: Not Currently   Drug use: No    Pertinent Clinical Results:  LABS: Labs reviewed: Acceptable for surgery.  Hospital Outpatient Visit on 07/07/2022  Component Date Value Ref Range Status   ABO/RH(D) 07/07/2022 B POS   Final   Antibody Screen 07/07/2022 NEG   Final   Sample Expiration 07/07/2022 07/21/2022,2359   Final   Extend sample reason 07/07/2022    Final                   Value:NO TRANSFUSIONS OR PREGNANCY IN THE PAST 3 MONTHS Performed at [facility], [address] [place],  [place] [address]    WBC 07/07/2022 4.2  4.0 - 10.5 K/uL Final   RBC 07/07/2022 4.25  4.22 - 5.81 MIL/uL Final   Hemoglobin 07/07/2022 11.3 (L)  13.0 - 17.0 g/dL Final   HCT 07/07/2022 36.0 (L)  39.0 - 52.0 % Final   MCV 07/07/2022 84.7  80.0 - 100.0 fL Final   MCH 07/07/2022 26.6  26.0 - 34.0 pg Final   MCHC 07/07/2022 31.4  30.0 - 36.0 g/dL Final   RDW 07/07/2022 16.4 (H)  11.5 - 15.5 % Final   Platelets 07/07/2022 222  150 - 400 K/uL Final   nRBC 07/07/2022 0.0  0.0 - 0.2 % Final   Performed at [facility], [address] [place], [place] [address]   Sodium 07/07/2022 135  135 - 145 mmol/L Final   Potassium 07/07/2022 4.0  3.5 - 5.1 mmol/L Final   Chloride 07/07/2022 105  98 - 111  mmol/L Final   CO2 07/07/2022 22  22 - 32 mmol/L Final   Glucose, Bld 07/07/2022 155 (H)  70 - 99 mg/dL Final   Glucose reference range applies only to samples taken after fasting for at least 8 hours.   BUN 07/07/2022 31 (H)  8 - 23 mg/dL Final   Creatinine, Ser 07/07/2022 1.23  0.61 - 1.24 mg/dL Final   Calcium 07/07/2022 9.8  8.9 - 10.3 mg/dL Final   GFR, Estimated 07/07/2022 >60  >60 mL/min Final   Comment: (NOTE) Calculated using the CKD-EPI Creatinine Equation (2021)    Anion gap 07/07/2022 8  5 - 15 Final   Performed at [facility], [address] [place], [place] [address]   Prothrombin Time 07/07/2022 13.3  11.4 - 15.2 seconds Final   INR 07/07/2022 1.0  0.8 - 1.2 Final   Comment: (NOTE) INR goal varies based on device and disease states. Performed at [facility], [place], [place] [address]    Color, Urine 07/07/2022 YELLOW (A)  YELLOW Final   APPearance 07/07/2022 CLEAR (A)  CLEAR Final   Specific Gravity, Urine 07/07/2022 1.017  1.005 - 1.030 Final   pH 07/07/2022 5.0  5.0 - 8.0 Final   Glucose, UA 07/07/2022 NEGATIVE  NEGATIVE mg/dL Final   Hgb urine dipstick 07/07/2022 NEGATIVE  NEGATIVE Final   Bilirubin Urine 07/07/2022 NEGATIVE  NEGATIVE  Final   Ketones, ur 07/07/2022 NEGATIVE  NEGATIVE mg/dL Final   Protein, ur 07/07/2022 NEGATIVE  NEGATIVE mg/dL Final   Nitrite 07/07/2022 NEGATIVE  NEGATIVE Final   Leukocytes,Ua 07/07/2022 NEGATIVE  NEGATIVE Final   WBC, UA 07/07/2022 NONE SEEN  0 - 5 WBC/hpf Final   Bacteria, UA 07/07/2022 NONE SEEN  NONE SEEN Final   Squamous Epithelial / LPF 07/07/2022 0-5  0 - 5 Final   Mucus 07/07/2022 PRESENT   Final   Performed at [facility], [address], [place], [place] [address]   Specimen Description 07/07/2022    Final                   Value:URINE, CLEAN CATCH Performed at [facility], [address] [place], [place] [address]    Special Requests 07/07/2022    Final                   Value:NONE Performed at [facility], [address] [place], [place] [address]    Culture 07/07/2022    Final                   Value:NO GROWTH Performed at [facility], [place] [address], [place], [place] [address]    Report Status 07/07/2022 07/08/2022 FINAL   Final    ECG: Date: 04/10/2022 Time ECG obtained: 1247 PM Rate: 87 bpm Rhythm: normal sinus Axis (leads I and aVF): Normal Intervals: PR 172 ms. QRS 90 ms. QTc 409 ms. ST segment and T wave changes: No evidence of acute ST segment elevation or depression Comparison: Similar to previous tracing obtained on 02/18/2022   IMAGING / PROCEDURES: CT CHEST WO CONTRAST performed on 06/01/2022 Interval development of bandlike opacity along the major fissure of the inferior left hemithorax. Given the relatively rapid appearance, this is likely volume loss/atelectasis. Follow-up CT chest without contrast in 3 months recommended to ensure resolution. Wispy soft tissue in the anterior mediastinum with underlying small nodular components is similar in the interval 2 minimally more conspicuous.  While this may be related to thymus, attention on follow-up CT recommended. 4.2 cm ascending thoracic aortic aneurysm.  Recommend annual imaging follow up by CTA or MRA.  Small bilateral renal cysts with an interpolar left renal mass better evaluated by MRI on 04/10/2022.  US CAROTID BILATERAL performed on 04/11/2022 Mild plaque at the level of both carotid bulbs and the level of the proximal LEFT ICA Estimated LEFT ICA stenosis is <50% No evidence of RIGHT ICA stenosis  TRANSTHORACIC ECHOCARDIOGRAM performed on 04/11/2022 Left ventricular ejection fraction, by estimation, is 55 to 60%. The left ventricle has normal function. The left ventricle has no regional wall motion abnormalities. Left ventricular diastolic parameters are consistent with Grade I diastolic dysfunction (impaired relaxation).  Right ventricular systolic function is normal. The right ventricular size is normal.  Left atrial size was mildly dilated.  Right atrial size was mildly dilated.  The mitral valve is normal in structure. Mild mitral valve regurgitation.  The aortic valve is normal in structure. Aortic valve regurgitation is not visualized.   MR ABDOMEN WO CONTRAST performed on 04/10/2022 Heterogeneous mass of the posterior lip of the LEFT kidney extending into the renal sinus, measuring 5.3 x 4.3 cm. Findings are most consistent with a renal cell carcinoma, although in general incompletely characterized by non contrast MR. No evidence of lymphadenopathy, obvious renal vein invasion, or obvious abdominal metastatic disease on non contrast examination.  CT HEAD WO CONTRAST performed on 04/10/2022 No acute intracranial findings are seen in non contrast CT brain. Atrophy.  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  MR BRAIN/IAC W WO CONTRAST performed on 01/14/2022 Negative internal auditory canal imaging. No retrocochlear lesion. 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  Impression and Plan:  Michael Hinton. has been referred for pre-anesthesia review and  clearance prior to him undergoing the planned anesthetic and procedural courses. Available labs, pertinent testing, and imaging results were personally reviewed by me. This patient has been appropriately cleared by cardiology with an overall LOW risk of significant perioperative cardiovascular complications.  Based on clinical review performed today (07/09/22), barring any significant acute changes in the patient's overall condition, it is anticipated that he will be able to proceed with the planned surgical intervention. Any acute changes in clinical condition may necessitate his procedure being postponed and/or cancelled. Patient will meet with anesthesia team (MD and/or CRNA) on the day of his procedure for preoperative evaluation/assessment. Questions regarding anesthetic course will be fielded at that time.   Pre-surgical instructions were reviewed with the patient during his PAT appointment and questions were fielded by PAT clinical staff. Patient was advised that if any questions or concerns arise prior to his procedure then he should return a call to PAT and/or his surgeon's office to discuss.  Michael Loh, MSN, APRN, FNP-C, CEN [facility]  Peri-operative Services Nurse Practitioner Phone: [phone] Fax: [phone] 07/09/22 9:03 AM  NOTE: This note has been prepared using Dragon dictation software. Despite my best ability to proofread, there is always the potential that unintentional transcriptional errors may still occur from this process.

## 2022-07-09 ENCOUNTER — Encounter: Payer: Self-pay | Admitting: Urology

## 2022-07-10 NOTE — Progress Notes (Unsigned)
Office Visit Note  Patient: Michael Hinton.             Date of Birth: Jul 29, 1951           MRN: [id number]             PCP: Danelle Berry, NP Referring: Danelle Berry, NP Visit Date: 07/23/2022 Occupation: '@GUAROCC'$ @  Subjective:  Joint stiffness   History of Present Illness: [name]. is a 71 y.o. male with history of seropositive rheumatoid arthritis.  He is not currently taking any biologics or DMARDs for management of rheumatoid arthritis.  He is taking prednisone 10 mg daily.  He has been tolerating prednisone without any side effects.  His RA has been well controlled taking prednisone as prescribed.  He denies any increased joint swelling. His mobility has improved while taking prednisone.  He denies any recent or recurrent infections.  He underwent a hand-assisted laparoscopic left radical nephrectomy on 07/13/2022 performed by Dr. Erlene Quan.  Pathology report is pending. He is scheduled for an upcoming appointment with his nephrologist on 08/04/2022 and with Dr. Erlene Quan his urologist on 08/18/2022.    Activities of Daily Living:  Patient reports morning stiffness for all day. Patient Denies nocturnal pain.  Difficulty dressing/grooming: Denies Difficulty climbing stairs: Denies Difficulty getting out of chair: Denies Difficulty using hands for taps, buttons, cutlery, and/or writing: Reports  Review of Systems  Constitutional:  Negative for fatigue.  HENT:  Negative for mouth sores and mouth dryness.   Eyes:  Negative for dryness.  Respiratory:  Negative for shortness of breath.   Cardiovascular:  Negative for chest pain and palpitations.  Gastrointestinal:  Negative for blood in stool, constipation and diarrhea.  Endocrine: Negative for increased urination.  Genitourinary:  Negative for involuntary urination.  Musculoskeletal:  Positive for joint pain, joint pain, myalgias, muscle weakness, morning stiffness, muscle tenderness and myalgias. Negative for  gait problem and joint swelling.  Skin:  Negative for color change, rash and sensitivity to sunlight.  Allergic/Immunologic: Negative for susceptible to infections.  Neurological:  Negative for dizziness and headaches.  Hematological:  Negative for swollen glands.  Psychiatric/Behavioral:  Negative for depressed mood and sleep disturbance. The patient is not nervous/anxious.     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   Weakness    AKI (acute kidney injury) ([place])    Normocytic anemia    Dysphagia    Unintentional weight loss    Hypercalcemia    Renal mass 04/10/2022   Vitamin B12 deficiency 11/04/2021   DDD (degenerative disc disease), lumbar 03/11/2018   ANA positive 01/22/2017   Leucopenia 01/22/2017   Contracture of elbow 08/23/2016   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   Rheumatoid arthritis involving multiple sites with positive rheumatoid factor ([place]) 06/02/2016   High risk medication use 06/02/2016   Hypertension 06/02/2016   Vitamin D deficiency 06/02/2016    Past Medical History:  Diagnosis Date   Anemia    Aortic atherosclerosis ([place])    Atrial fibrillation ([place])    a.) CHA2DS2VASc = 5 (age, HTN, CVA x 2, vascular disease history);  b.) rate/rhythm maintained without pharmacological intervention; chronically anticoagulated with clopidogrel   Bilateral renal cysts 05/29/2022   Carotid artery disease ([place]) 04/11/2022   a.) doppler 40/97/3532: <99 LICA, no sig RICA.   CVA (cerebral vascular accident) ([place])  a.) MRI brain 01/14/2022: numerous chronic cerebellar infarcts; b.) CT head 04/10/2022: small old lacunar infarcts are seen in cerebellum bilaterally   Diastolic dysfunction 25/63/8937   a.) TTE 04/11/2022: EF 55-60%, mild BAE, mild MR, G1DD.   HLD (hyperlipidemia)    Hypertension 06/02/2016   Long term current use of  antithrombotics/antiplatelets    a.) clopidogrel   Long term current use of systemic steroids    a.) prednisone for diagnosis of RA   Neutropenia (HCC)    Renal cell cancer, left ([place]) 04/10/2022   a.) renal US 04/10/2022: solid heterogenous LEFT renal mass measuring 5.2 cm; b.) MRI ABD - 5.3 x 4.3 cm renal mass to posterior lip of LEFT kidney extending into renal sinus; c.)  renal Bx  05/06/2022 - (+) for RCC with clear cell features   Rheumatoid arthritis involving multiple sites with positive rheumatoid factor ([place]) 06/02/2016   T2DM (type 2 diabetes mellitus) ([place])    Thoracic ascending aortic aneurysm ([place]) 06/01/2022   a.) CT chest - measured 4.2 cm    Family History  Problem Relation Age of Onset   Heart disease Mother    Diabetes Mother    Breast cancer Mother    Kidney disease Father    Diabetes Father    Diabetes Daughter    Past Surgical History:  Procedure Laterality Date   LAPAROSCOPIC NEPHRECTOMY, HAND ASSISTED Left 07/13/2022   Procedure: HAND ASSISTED LAPAROSCOPIC RADICAL NEPHRECTOMY;  Surgeon: Hollice Espy, MD;  Location: [facility] ORS;  Service: Urology;  Laterality: Left;   RENAL BIOPSY  05/2022   Social History   Social History Narrative   Not on file   Immunization History  Administered Date(s) Administered   Moderna Sars-Covid-2 Vaccination 10/12/2019, 11/15/2019   PPD Test 03/20/2022     Objective: Vital Signs: BP 138/75 (BP Location: Left Arm, Patient Position: Sitting, Cuff Size: Normal)   Pulse 74   Resp 14   Ht '5\' 11"'$  (1.803 m)   Wt 185 lb 6.4 oz (84.1 kg)   BMI 25.86 kg/m    Physical Exam Vitals and nursing note reviewed.  Constitutional:      Appearance: He is well-developed.  HENT:     Head: Normocephalic and atraumatic.  Eyes:     Conjunctiva/sclera: Conjunctivae normal.     Pupils: Pupils are equal, round, and reactive to light.  Cardiovascular:     Rate and Rhythm: Normal rate and regular rhythm.     Heart sounds: Normal heart  sounds.  Pulmonary:     Effort: Pulmonary effort is normal.     Breath sounds: Normal breath sounds.  Abdominal:     General: Bowel sounds are normal.     Palpations: Abdomen is soft.  Musculoskeletal:     Cervical back: Normal range of motion and neck supple.  Skin:    General: Skin is warm and dry.     Capillary Refill: Capillary refill takes less than 2 seconds.  Neurological:     Mental Status: He is alert and oriented to person, place, and time.  Psychiatric:        Behavior: Behavior normal.      Musculoskeletal Exam: Patient remained seated during the examination today. C-spine has limited ROM with lateral rotation.  Shoulder joints have limited ROM with abduction and internal rotation.  Elbow joint flexion contractures.  Limited ROM of both wright joints.  Thickening of all MCP joints.  Incomplete fist formation.  PIP and DIP thickening.  Right knee has warmth  and a flexion contracture.  Left knee has good ROM with no warmth or effusion.  Ankle joints are subluxed but non-tender.   CDAI Exam: CDAI Score: -- Patient Global: 3 mm; Provider Global: 3 mm Swollen: --; Tender: -- Joint Exam 07/23/2022   No joint exam has been documented for this visit   There is currently no information documented on the homunculus. Go to the Rheumatology activity and complete the homunculus joint exam.  Investigation: No additional findings.  Imaging: No results found.  Recent Labs: Lab Results  Component Value Date   WBC 4.6 07/14/2022   HGB 9.3 (L) 07/14/2022   PLT 178 07/14/2022   NA 138 07/14/2022   K 3.5 07/14/2022   CL 111 07/14/2022   CO2 23 07/14/2022   GLUCOSE 97 07/14/2022   BUN 23 07/14/2022   CREATININE 1.46 (H) 07/14/2022   BILITOT 1.1 04/10/2022   ALKPHOS 94 04/10/2022   AST 32 04/10/2022   ALT 22 04/10/2022   PROT 8.7 (H) 04/10/2022   ALBUMIN 3.3 (L) 04/10/2022   CALCIUM 8.2 (L) 07/14/2022   GFRAA 86 02/03/2021   QFTBGOLD NEGATIVE 06/16/2017   QFTBGOLDPLUS  INDETERMINATE (A) 03/06/2022    Speciality Comments: TB Skin Test 03/23/2022: Negative  Procedures:  No procedures performed Allergies: Patient has no known allergies.     Assessment / Plan:     Visit Diagnoses: Rheumatoid arthritis involving multiple sites with positive rheumatoid factor ([place]) - His joint pain and stiffness have improved since taking prednisone 10 mg daily.  He is tolerating prednisone without any side effects.  He has been more mobile and his nocturnal pain has resolved.  He is not currently taking any DMARDs/Biologics.  He discontinued Kevzara on 04/09/22.  On 07/13/2022 he underwent a scheduled hand-assisted laparoscopic left radical nephrectomy performed by Dr. Erlene Quan.  Pathology report is currently pending.  He has upcoming appointment with nephrology on 08/04/2022 and with Dr. Erlene Quan on 08/18/2022.  He is unsure of the future plan for management.   His symptoms are currently adequately controlled on the current dose of prednisone.  No medication changes will be made at this time. DEXA order placed today due to long term systemic prednisone use. He was advised to notify us if he develops signs or symptoms of a flare.  He will follow-up in the office in 3 months or sooner if needed.  He plans on having his nephrologist and urologist in this office visit notes as well as follow-up to consent has resulted.  Plan: DG BONE DENSITY (DXA)  High risk medication use - Patient is not currently taking any immunosuppressive agents (biologics or DMARDs).  He will remain on prednisone 10 mg daily. CBC and BMP updated on 07/14/22.   Long term systemic steroid user -He is currently taking prednisone 10 mg daily.  He is tolerating prednisone without any side effects.  Discussed the risks of long term prednisone use. Future order for DEXA was placed today.  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  Patient  discontinued Kevzara on 04/09/2022. WBC count was WNL-4.6 on 07/14/22.   Left renal mass - Incidental finding while hospitalized 9/1 to 04/14/2022. Patient was advised to discontinue Kevzara on 04/09/2022.   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 Patient was admitted on 07/13/2022 for scheduled hand-assisted laparoscopic  left radical nephrectomy with Dr. Erlene Quan for management of a left renal mass.  Pathology report pending.  He has an upcoming appointment with Dr. Holley Raring (nephrology)-08/04/22 and Dr. Erlene Quan (urology)-08/18/22.   ANA positive - No clinical features of systemic lupus at this time.  Contracture of left elbow: Mild warmth.   Contracture of right elbow: No tenderness or inflammation noted today.   Contractures of both knees: Right knee flexion contracture and warmth noted.  Left knee has good ROM with no warmth or effusion on examination today. He has been more mobile with the use of prednisone.   Spinal stenosis of lumbar region without neurogenic claudication: No symptoms of radiculopathy at this time.   DDD (degenerative disc disease), lumbar: No symptoms of radiculopathy at this time.   Other medical conditions are listed as follows:   Vitamin D deficiency: She is taking vitamin D 5000 units daily.   Primary hypertension: BP was 138/75 today in the office.      Orders: Orders Placed This Encounter  Procedures   DG BONE DENSITY (DXA)   No orders of the defined types were placed in this encounter.    Follow-Up Instructions: Return in about 3 months (around 10/22/2022) for Rheumatoid arthritis.   Ofilia Neas, PA-C  Note - This record has been created using Dragon software.  Chart creation errors have been sought, but may not always  have been located. Such creation errors do not reflect on  the standard of medical care.

## 2022-07-13 ENCOUNTER — Inpatient Hospital Stay
Admission: RE | Admit: 2022-07-13 | Discharge: 2022-07-14 | DRG: 658 | Disposition: A | Discharge status: Home / Self Care | Payer: Medicare PPO | Attending: Urology | Admitting: Urology

## 2022-07-13 ENCOUNTER — Inpatient Hospital Stay: Payer: Medicare PPO | Admitting: Urgent Care

## 2022-07-13 ENCOUNTER — Encounter: Payer: Self-pay | Admitting: Urology

## 2022-07-13 ENCOUNTER — Other Ambulatory Visit: Payer: Self-pay

## 2022-07-13 ENCOUNTER — Encounter
Admission: RE | Disposition: A | Discharge status: Home / Self Care | Payer: Self-pay | Source: Home / Self Care | Attending: Urology

## 2022-07-13 DIAGNOSIS — Z87891 Personal history of nicotine dependence: Secondary | ICD-10-CM

## 2022-07-13 DIAGNOSIS — Z7902 Long term (current) use of antithrombotics/antiplatelets: Secondary | ICD-10-CM | POA: Diagnosis not present

## 2022-07-13 DIAGNOSIS — I4891 Unspecified atrial fibrillation: Secondary | ICD-10-CM | POA: Diagnosis present

## 2022-07-13 DIAGNOSIS — E559 Vitamin D deficiency, unspecified: Secondary | ICD-10-CM | POA: Diagnosis present

## 2022-07-13 DIAGNOSIS — Z79899 Other long term (current) drug therapy: Secondary | ICD-10-CM

## 2022-07-13 DIAGNOSIS — D3002 Benign neoplasm of left kidney: Secondary | ICD-10-CM | POA: Diagnosis present

## 2022-07-13 DIAGNOSIS — Z7952 Long term (current) use of systemic steroids: Secondary | ICD-10-CM

## 2022-07-13 DIAGNOSIS — Z833 Family history of diabetes mellitus: Secondary | ICD-10-CM | POA: Diagnosis not present

## 2022-07-13 DIAGNOSIS — C642 Malignant neoplasm of left kidney, except renal pelvis: Secondary | ICD-10-CM | POA: Diagnosis present

## 2022-07-13 DIAGNOSIS — Z803 Family history of malignant neoplasm of breast: Secondary | ICD-10-CM | POA: Diagnosis not present

## 2022-07-13 DIAGNOSIS — M0579 Rheumatoid arthritis with rheumatoid factor of multiple sites without organ or systems involvement: Secondary | ICD-10-CM | POA: Diagnosis present

## 2022-07-13 DIAGNOSIS — E785 Hyperlipidemia, unspecified: Secondary | ICD-10-CM | POA: Diagnosis present

## 2022-07-13 DIAGNOSIS — E538 Deficiency of other specified B group vitamins: Secondary | ICD-10-CM | POA: Diagnosis present

## 2022-07-13 DIAGNOSIS — Z8249 Family history of ischemic heart disease and other diseases of the circulatory system: Secondary | ICD-10-CM | POA: Diagnosis not present

## 2022-07-13 DIAGNOSIS — Z8673 Personal history of transient ischemic attack (TIA), and cerebral infarction without residual deficits: Secondary | ICD-10-CM

## 2022-07-13 DIAGNOSIS — Z841 Family history of disorders of kidney and ureter: Secondary | ICD-10-CM | POA: Diagnosis not present

## 2022-07-13 DIAGNOSIS — E119 Type 2 diabetes mellitus without complications: Secondary | ICD-10-CM | POA: Diagnosis present

## 2022-07-13 DIAGNOSIS — I10 Essential (primary) hypertension: Secondary | ICD-10-CM | POA: Diagnosis present

## 2022-07-13 HISTORY — DX: Hyperlipidemia, unspecified: E78.5

## 2022-07-13 HISTORY — DX: Atherosclerosis of aorta: I70.0

## 2022-07-13 HISTORY — PX: LAPAROSCOPIC NEPHRECTOMY, HAND ASSISTED: SHX1929

## 2022-07-13 HISTORY — DX: Long term (current) use of antithrombotics/antiplatelets: Z79.02

## 2022-07-13 HISTORY — DX: Cerebral infarction, unspecified: I63.9

## 2022-07-13 HISTORY — DX: Long term (current) use of systemic steroids: Z79.52

## 2022-07-13 HISTORY — DX: Type 2 diabetes mellitus without complications: E11.9

## 2022-07-13 HISTORY — DX: Unspecified atrial fibrillation: I48.91

## 2022-07-13 LAB — GLUCOSE, CAPILLARY: Glucose-Capillary: 135 mg/dL — ABNORMAL HIGH (ref 70–99)

## 2022-07-13 LAB — ABO/RH: ABO/RH(D): B POS

## 2022-07-13 SURGERY — NEPHRECTOMY, HAND-ASSISTED, LAPAROSCOPIC
Anesthesia: General | Laterality: Left

## 2022-07-13 MED ORDER — SODIUM BICARBONATE 650 MG PO TABS
650.0000 mg | ORAL_TABLET | Freq: Two times a day (BID) | ORAL | Status: DC
  Administered 2022-07-13 – 2022-07-14 (×2): 650 mg via ORAL
  Filled 2022-07-13 (×2): qty 1

## 2022-07-13 MED ORDER — PROPOFOL 10 MG/ML IV BOLUS
INTRAVENOUS | Status: DC | PRN
  Administered 2022-07-13: 50 mg via INTRAVENOUS
  Administered 2022-07-13: 150 mg via INTRAVENOUS

## 2022-07-13 MED ORDER — ACETAMINOPHEN 325 MG PO TABS: 650.0000 mg | ORAL_TABLET | ORAL | Status: DC | PRN

## 2022-07-13 MED ORDER — CHLORHEXIDINE GLUCONATE 4 % EX LIQD: 60.0000 mL | Freq: Once | CUTANEOUS | Status: DC

## 2022-07-13 MED ORDER — FAMOTIDINE 20 MG PO TABS: 20.0000 mg | ORAL_TABLET | Freq: Once | ORAL | Status: AC

## 2022-07-13 MED ORDER — DIPHENHYDRAMINE HCL 50 MG/ML IJ SOLN: 12.5000 mg | Freq: Four times a day (QID) | INTRAMUSCULAR | Status: DC | PRN

## 2022-07-13 MED ORDER — CEFAZOLIN SODIUM-DEXTROSE 2-4 GM/100ML-% IV SOLN
INTRAVENOUS | Status: AC
  Filled 2022-07-13: qty 100

## 2022-07-13 MED ORDER — HYDROCORTISONE SOD SUC (PF) 100 MG IJ SOLR
INTRAMUSCULAR | Status: AC
  Filled 2022-07-13: qty 2

## 2022-07-13 MED ORDER — ORAL CARE MOUTH RINSE: 15.0000 mL | Freq: Once | OROMUCOSAL | Status: AC

## 2022-07-13 MED ORDER — SURGIFLO WITH THROMBIN (HEMOSTATIC MATRIX KIT) OPTIME
TOPICAL | Status: DC | PRN
  Administered 2022-07-13: 1

## 2022-07-13 MED ORDER — OXYCODONE-ACETAMINOPHEN 5-325 MG PO TABS
1.0000 | ORAL_TABLET | ORAL | Status: DC | PRN
  Administered 2022-07-13 – 2022-07-14 (×2): 2 via ORAL
  Filled 2022-07-13 (×3): qty 2

## 2022-07-13 MED ORDER — ROSUVASTATIN CALCIUM 20 MG PO TABS
40.0000 mg | ORAL_TABLET | Freq: Every day | ORAL | Status: DC
  Administered 2022-07-13 – 2022-07-14 (×2): 40 mg via ORAL
  Filled 2022-07-13 (×2): qty 2

## 2022-07-13 MED ORDER — CEFAZOLIN SODIUM-DEXTROSE 1-4 GM/50ML-% IV SOLN
1.0000 g | Freq: Three times a day (TID) | INTRAVENOUS | Status: AC
  Administered 2022-07-13 – 2022-07-14 (×2): 1 g via INTRAVENOUS
  Filled 2022-07-13 (×2): qty 50

## 2022-07-13 MED ORDER — DIPHENHYDRAMINE HCL 12.5 MG/5ML PO ELIX: 12.5000 mg | ORAL_SOLUTION | Freq: Four times a day (QID) | ORAL | Status: DC | PRN

## 2022-07-13 MED ORDER — AMLODIPINE BESYLATE 5 MG PO TABS
10.0000 mg | ORAL_TABLET | Freq: Every morning | ORAL | Status: DC
  Administered 2022-07-14: 10 mg via ORAL
  Filled 2022-07-13: qty 2

## 2022-07-13 MED ORDER — OXYBUTYNIN CHLORIDE 5 MG PO TABS: 5.0000 mg | ORAL_TABLET | Freq: Three times a day (TID) | ORAL | Status: DC | PRN

## 2022-07-13 MED ORDER — OXYCODONE HCL 5 MG PO TABS
5.0000 mg | ORAL_TABLET | Freq: Once | ORAL | Status: AC | PRN
  Administered 2022-07-13: 5 mg via ORAL

## 2022-07-13 MED ORDER — ROCURONIUM BROMIDE 10 MG/ML (PF) SYRINGE
PREFILLED_SYRINGE | INTRAVENOUS | Status: AC
  Filled 2022-07-13: qty 10

## 2022-07-13 MED ORDER — OXYCODONE HCL 5 MG PO TABS
ORAL_TABLET | ORAL | Status: AC
  Filled 2022-07-13: qty 1

## 2022-07-13 MED ORDER — FENTANYL CITRATE (PF) 100 MCG/2ML IJ SOLN
INTRAMUSCULAR | Status: AC
  Filled 2022-07-13: qty 2

## 2022-07-13 MED ORDER — MIRTAZAPINE 7.5 MG PO TABS
7.5000 mg | ORAL_TABLET | Freq: Every day | ORAL | Status: DC
  Administered 2022-07-13: 7.5 mg via ORAL
  Filled 2022-07-13: qty 1

## 2022-07-13 MED ORDER — HYDROCORTISONE SOD SUC (PF) 100 MG IJ SOLR
INTRAMUSCULAR | Status: DC | PRN
  Administered 2022-07-13: 100 mg via INTRAVENOUS

## 2022-07-13 MED ORDER — CHLORHEXIDINE GLUCONATE 0.12 % MT SOLN
OROMUCOSAL | Status: AC
  Administered 2022-07-13: 15 mL via OROMUCOSAL
  Filled 2022-07-13: qty 15

## 2022-07-13 MED ORDER — PREDNISONE 10 MG PO TABS
10.0000 mg | ORAL_TABLET | Freq: Every day | ORAL | Status: DC
  Administered 2022-07-14: 10 mg via ORAL
  Filled 2022-07-13: qty 1

## 2022-07-13 MED ORDER — STERILE WATER FOR IRRIGATION IR SOLN
Status: DC | PRN
  Administered 2022-07-13: 500 mL

## 2022-07-13 MED ORDER — SUGAMMADEX SODIUM 200 MG/2ML IV SOLN
INTRAVENOUS | Status: DC | PRN
  Administered 2022-07-13: 200 mg via INTRAVENOUS

## 2022-07-13 MED ORDER — FENTANYL CITRATE (PF) 100 MCG/2ML IJ SOLN
INTRAMUSCULAR | Status: DC | PRN
  Administered 2022-07-13: 25 ug via INTRAVENOUS
  Administered 2022-07-13 (×2): 50 ug via INTRAVENOUS
  Administered 2022-07-13: 75 ug via INTRAVENOUS

## 2022-07-13 MED ORDER — ACETAMINOPHEN 10 MG/ML IV SOLN: INTRAVENOUS | Status: DC | PRN

## 2022-07-13 MED ORDER — FENTANYL CITRATE (PF) 100 MCG/2ML IJ SOLN
INTRAMUSCULAR | Status: AC
  Administered 2022-07-13: 25 ug via INTRAVENOUS
  Filled 2022-07-13: qty 2

## 2022-07-13 MED ORDER — BUPIVACAINE LIPOSOME 1.3 % IJ SUSP
INTRAMUSCULAR | Status: DC | PRN
  Administered 2022-07-13: 50 mL

## 2022-07-13 MED ORDER — MORPHINE SULFATE (PF) 2 MG/ML IV SOLN
2.0000 mg | INTRAVENOUS | Status: DC | PRN
  Administered 2022-07-13: 2 mg via INTRAVENOUS
  Administered 2022-07-13: 3 mg via INTRAVENOUS
  Administered 2022-07-14 (×2): 2 mg via INTRAVENOUS
  Filled 2022-07-13: qty 1
  Filled 2022-07-13: qty 2
  Filled 2022-07-13 (×2): qty 1

## 2022-07-13 MED ORDER — BUPIVACAINE LIPOSOME 1.3 % IJ SUSP
INTRAMUSCULAR | Status: AC
  Filled 2022-07-13: qty 20

## 2022-07-13 MED ORDER — ONDANSETRON HCL 4 MG/2ML IJ SOLN
INTRAMUSCULAR | Status: AC
  Filled 2022-07-13: qty 2

## 2022-07-13 MED ORDER — ROCURONIUM BROMIDE 100 MG/10ML IV SOLN
INTRAVENOUS | Status: DC | PRN
  Administered 2022-07-13: 10 mg via INTRAVENOUS
  Administered 2022-07-13: 20 mg via INTRAVENOUS
  Administered 2022-07-13: 50 mg via INTRAVENOUS

## 2022-07-13 MED ORDER — LACTATED RINGERS IV SOLN: INTRAVENOUS | Status: DC

## 2022-07-13 MED ORDER — ONDANSETRON HCL 4 MG/2ML IJ SOLN: 4.0000 mg | INTRAMUSCULAR | Status: DC | PRN

## 2022-07-13 MED ORDER — FENTANYL CITRATE (PF) 100 MCG/2ML IJ SOLN
25.0000 ug | INTRAMUSCULAR | Status: DC | PRN
  Administered 2022-07-13 (×3): 25 ug via INTRAVENOUS

## 2022-07-13 MED ORDER — DOCUSATE SODIUM 100 MG PO CAPS
100.0000 mg | ORAL_CAPSULE | Freq: Two times a day (BID) | ORAL | Status: DC
  Administered 2022-07-13 – 2022-07-14 (×2): 100 mg via ORAL
  Filled 2022-07-13 (×3): qty 1

## 2022-07-13 MED ORDER — CHLORHEXIDINE GLUCONATE 0.12 % MT SOLN: 15.0000 mL | Freq: Once | OROMUCOSAL | Status: AC

## 2022-07-13 MED ORDER — LIDOCAINE HCL (PF) 2 % IJ SOLN
INTRAMUSCULAR | Status: AC
  Filled 2022-07-13: qty 5

## 2022-07-13 MED ORDER — ONDANSETRON HCL 4 MG/2ML IJ SOLN
INTRAMUSCULAR | Status: DC | PRN
  Administered 2022-07-13: 4 mg via INTRAVENOUS

## 2022-07-13 MED ORDER — BUPIVACAINE HCL (PF) 0.5 % IJ SOLN
INTRAMUSCULAR | Status: AC
  Filled 2022-07-13: qty 30

## 2022-07-13 MED ORDER — CEFAZOLIN SODIUM-DEXTROSE 2-4 GM/100ML-% IV SOLN
2.0000 g | INTRAVENOUS | Status: AC
  Administered 2022-07-13: 2 g via INTRAVENOUS

## 2022-07-13 MED ORDER — SODIUM CHLORIDE 0.9 % IV SOLN: INTRAVENOUS | Status: DC

## 2022-07-13 MED ORDER — HEPARIN SODIUM (PORCINE) 5000 UNIT/ML IJ SOLN
5000.0000 [IU] | Freq: Three times a day (TID) | INTRAMUSCULAR | Status: DC
  Administered 2022-07-13 – 2022-07-14 (×3): 5000 [IU] via SUBCUTANEOUS
  Filled 2022-07-13 (×3): qty 1

## 2022-07-13 MED ORDER — LIDOCAINE HCL (CARDIAC) PF 100 MG/5ML IV SOSY
PREFILLED_SYRINGE | INTRAVENOUS | Status: DC | PRN
  Administered 2022-07-13: 100 mg via INTRAVENOUS

## 2022-07-13 MED ORDER — OXYCODONE HCL 5 MG/5ML PO SOLN: 5.0000 mg | Freq: Once | ORAL | Status: AC | PRN

## 2022-07-13 MED ORDER — PROPOFOL 10 MG/ML IV BOLUS
INTRAVENOUS | Status: AC
  Filled 2022-07-13: qty 20

## 2022-07-13 MED ORDER — FAMOTIDINE 20 MG PO TABS
ORAL_TABLET | ORAL | Status: AC
  Administered 2022-07-13: 20 mg via ORAL
  Filled 2022-07-13: qty 1

## 2022-07-13 SURGICAL SUPPLY — 87 items
ADH SKN CLS APL DERMABOND .7 (GAUZE/BANDAGES/DRESSINGS) ×2
AGENT HMST KT MTR STRL THRMB (HEMOSTASIS)
APL ESCP 34 STRL LF DISP (HEMOSTASIS)
APL PRP STRL LF DISP 70% ISPRP (MISCELLANEOUS) ×2
APPLICATOR SURGIFLO ENDO (HEMOSTASIS) IMPLANT
APPLIER CLIP ROT 10 11.4 M/L (STAPLE)
APPLIER CLIP ROT 13.4 12 LRG (CLIP)
APR CLP LRG 13.4X12 ROT 20 MLT (CLIP)
APR CLP MED LRG 11.4X10 (STAPLE)
BAG LAPAROSCOPIC 12 15 PORT 16 (BASKET) ×1 IMPLANT
BAG RETRIEVAL 12/15 (BASKET) ×1
CHLORAPREP W/TINT 26 (MISCELLANEOUS) ×1 IMPLANT
CLEANER CAUTERY TIP 5X5 PAD (MISCELLANEOUS) ×1 IMPLANT
CLIP APPLIE ROT 10 11.4 M/L (STAPLE) IMPLANT
CLIP APPLIE ROT 13.4 12 LRG (CLIP) IMPLANT
CLIP LIGATING HEM O LOK PURPLE (MISCELLANEOUS) ×1 IMPLANT
CUTTER ECHEON FLEX ENDO 45 340 (ENDOMECHANICALS) IMPLANT
DEFOGGER SCOPE WARMER CLEARIFY (MISCELLANEOUS) ×1 IMPLANT
DERMABOND ADVANCED .7 DNX12 (GAUZE/BANDAGES/DRESSINGS) ×2 IMPLANT
DRAPE INCISE IOBAN 66X45 STRL (DRAPES) ×1 IMPLANT
DRAPE STERI POUCH LG 24X46 STR (DRAPES) ×1 IMPLANT
DRAPE SURG 17X11 SM STRL (DRAPES) ×4 IMPLANT
DRSG TEGADERM 2-3/8X2-3/4 SM (GAUZE/BANDAGES/DRESSINGS) IMPLANT
DRSG TEGADERM 4X4.75 (GAUZE/BANDAGES/DRESSINGS) IMPLANT
DRSG TELFA 3X8 NADH STRL (GAUZE/BANDAGES/DRESSINGS) IMPLANT
ELECT REM PT RETURN 9FT ADLT (ELECTROSURGICAL) ×1
ELECTRODE REM PT RTRN 9FT ADLT (ELECTROSURGICAL) ×1 IMPLANT
GLOVE BIO SURGEON STRL SZ 6.5 (GLOVE) ×2 IMPLANT
GLOVE SURG UNDER LTX SZ6.5 (GLOVE) ×1 IMPLANT
GOWN STRL REUS W/ TWL LRG LVL3 (GOWN DISPOSABLE) ×3 IMPLANT
GOWN STRL REUS W/TWL LRG LVL3 (GOWN DISPOSABLE) ×3
GRASPER SUT TROCAR 14GX15 (MISCELLANEOUS) ×1 IMPLANT
HANDLE YANKAUER SUCT BULB TIP (MISCELLANEOUS) ×1 IMPLANT
HEMOSTAT SURGICEL 2X14 (HEMOSTASIS) IMPLANT
HOLDER FOLEY CATH W/STRAP (MISCELLANEOUS) ×1 IMPLANT
IRRIGATION STRYKERFLOW (MISCELLANEOUS) ×1 IMPLANT
IRRIGATOR STRYKERFLOW (MISCELLANEOUS) ×1
KIT PINK PAD W/HEAD ARE REST (MISCELLANEOUS) ×1
KIT PINK PAD W/HEAD ARM REST (MISCELLANEOUS) ×1 IMPLANT
KIT TURNOVER KIT A (KITS) ×1 IMPLANT
KITTNER LAPARASCOPIC 5X40 (MISCELLANEOUS) ×1 IMPLANT
L-HOOK LAP DISP 36CM (ELECTROSURGICAL) ×1
LABEL OR SOLS (LABEL) ×1 IMPLANT
LHOOK LAP DISP 36CM (ELECTROSURGICAL) ×1 IMPLANT
LIGASURE LAP ATLAS 10MM 37CM (INSTRUMENTS) ×1 IMPLANT
LOOP RED MAXI  1X406MM (MISCELLANEOUS) ×1
LOOP VESSEL MAXI  1X406 RED (MISCELLANEOUS) ×1
LOOP VESSEL MAXI 1X406 RED (MISCELLANEOUS) ×1 IMPLANT
MANIFOLD NEPTUNE II (INSTRUMENTS) ×1 IMPLANT
NDL HYPO 21X1.5 SAFETY (NEEDLE) ×1 IMPLANT
NEEDLE HYPO 21X1.5 SAFETY (NEEDLE) ×1 IMPLANT
PACK LAP CHOLECYSTECTOMY (MISCELLANEOUS) ×1 IMPLANT
RELOAD STAPLE 35X2.5 WHT THIN (STAPLE) IMPLANT
RELOAD STAPLE 45 2.6 WHT THIN (STAPLE) IMPLANT
RELOAD STAPLE 60 2.6 WHT THN (STAPLE) IMPLANT
RELOAD STAPLER WHITE 60MM (STAPLE) ×3 IMPLANT
SCISSORS METZENBAUM CVD 33 (INSTRUMENTS) ×1 IMPLANT
SET TUBE SMOKE EVAC HIGH FLOW (TUBING) ×1 IMPLANT
SPONGE T-LAP 18X18 ~~LOC~~+RFID (SPONGE) ×1 IMPLANT
STAPLE ECHEON FLEX 60 POW ENDO (STAPLE) IMPLANT
STAPLE RELOAD 2.5MM WHITE (STAPLE) IMPLANT
STAPLE RELOAD 45 WHT (STAPLE) IMPLANT
STAPLE RELOAD 45MM WHITE (STAPLE)
STAPLER RELOAD WHITE 60MM (STAPLE) ×3
STAPLER SKIN PROX 35W (STAPLE) ×1 IMPLANT
STAPLER VASCULAR ECHELON 35 (CUTTER) IMPLANT
STRIP CLOSURE SKIN 1/2X4 (GAUZE/BANDAGES/DRESSINGS) IMPLANT
SURGIFLO W/THROMBIN 8M KIT (HEMOSTASIS) IMPLANT
SUT CHROMIC 0 CT 1 (SUTURE) IMPLANT
SUT MNCRL AB 4-0 PS2 18 (SUTURE) ×2 IMPLANT
SUT PDS AB 1 CT1 36 (SUTURE) IMPLANT
SUT PDS AB 1 TP1 96 (SUTURE) ×1 IMPLANT
SUT VIC AB 0 CT1 36 (SUTURE) ×2 IMPLANT
SUT VIC AB 1 CT1 36 (SUTURE) IMPLANT
SUT VIC AB 2-0 SH 27 (SUTURE)
SUT VIC AB 2-0 SH 27XBRD (SUTURE) IMPLANT
SUT VIC AB 4-0 FS2 27 (SUTURE) ×1 IMPLANT
SUT VICRYL 0 UR6 27IN ABS (SUTURE) ×1 IMPLANT
SYS LAPSCP GELPORT 120MM (MISCELLANEOUS) ×1
SYSTEM LAPSCP GELPORT 120MM (MISCELLANEOUS) ×1 IMPLANT
TRAP FLUID SMOKE EVACUATOR (MISCELLANEOUS) ×1 IMPLANT
TRAY FOLEY MTR SLVR 16FR STAT (SET/KITS/TRAYS/PACK) ×1 IMPLANT
TROCAR ENDOPATH XCEL 12X100 BL (ENDOMECHANICALS) ×1 IMPLANT
TROCAR XCEL 12X100 BLDLESS (ENDOMECHANICALS) ×1 IMPLANT
TROCAR XCEL NON-BLD 5MMX100MML (ENDOMECHANICALS) IMPLANT
WATER STERILE IRR 3000ML UROMA (IV SOLUTION) ×1 IMPLANT
WATER STERILE IRR 500ML POUR (IV SOLUTION) ×1 IMPLANT

## 2022-07-13 NOTE — Anesthesia Procedure Notes (Signed)
Procedure Name: Intubation Date/Time: 07/13/2022 11:24 AM  Performed by: Natasha Mead, CRNAPre-anesthesia Checklist: Patient identified, Emergency Drugs available, Suction available and Patient being monitored Patient Re-evaluated:Patient Re-evaluated prior to induction Oxygen Delivery Method: Circle system utilized Preoxygenation: Pre-oxygenation with 100% oxygen Induction Type: IV induction Ventilation: Mask ventilation without difficulty Laryngoscope Size: Miller and 3 Grade View: Grade II Tube type: Oral Tube size: 7.0 mm Number of attempts: 1 Airway Equipment and Method: Stylet and Oral airway Placement Confirmation: ETT inserted through vocal cords under direct vision, positive ETCO2 and breath sounds checked- equal and bilateral Secured at: 22 cm Tube secured with: Tape Dental Injury: Teeth and Oropharynx as per pre-operative assessment

## 2022-07-13 NOTE — Transfer of Care (Signed)
Immediate Anesthesia Transfer of Care Note  Patient: Sigmond Patalano.  Procedure(s) Performed: HAND ASSISTED LAPAROSCOPIC RADICAL NEPHRECTOMY (Left)  Patient Location: PACU  Anesthesia Type:General  Level of Consciousness: drowsy  Airway & Oxygen Therapy: Patient Spontanous Breathing and Patient connected to face mask oxygen  Post-op Assessment: Report given to RN and Post -op Vital signs reviewed and stable  Post vital signs: stable  Last Vitals:  Vitals Value Taken Time  BP 156/86 07/13/22 1346  Temp    Pulse 84 07/13/22 1351  Resp 20 07/13/22 1351  SpO2 100 % 07/13/22 1351  Vitals shown include unvalidated device data.  Last Pain:  Vitals:   07/13/22 1012  TempSrc: Temporal  PainSc: 0-No pain         Complications: No notable events documented.

## 2022-07-13 NOTE — H&P (Signed)
[facility]  Michael Hinton. 1950-11-28 [id number]   Referring provider: Danelle Berry, NP [place],  [place] [address]      Chief Complaint  Patient presents with   Results      HPI: 71 year old male with incidental left renal mass renal mass who returns today s/p biopsy.   He underwent uncomplicated renal mass biopsy on 05/06/2022 which shows evidence of renal cell carcinoma with clear cell features.  Grade was not assigned.   MRI without contrast that showed a heterogeneous mass involving the posterior lip of the left kidney extending into the renal sinus measuring 5.3 x 4.3 cm.   In the interim, his creatinine has trended back down to baseline, 0.98.  He is followed by nephrology.  He has been cleared for nephrectomy.   No previous abdominal surgeries.   He also has a personal history of elevated PSA (7.2) pending repeat in 12/23.       PMH:     Past Medical History:  Diagnosis Date   Anemia     High risk medication use 06/02/2016   Hypertension 06/02/2016   Neutropenia ([place])     Prediabetes     Rheumatoid arthritis involving multiple sites with positive rheumatoid factor ([place]) 06/02/2016      Surgical History: No past surgical history on file.   Home Medications:  Allergies as of 06/03/2022   No Known Allergies         Medication List           Accurate as of June 03, 2022 11:59 PM. If you have any questions, ask your nurse or doctor.              amLODipine 10 MG tablet Commonly known as: NORVASC Take 10 mg by mouth every morning.    clopidogrel 75 MG tablet Commonly known as: PLAVIX Take 75 mg by mouth daily.    folic acid 1 MG tablet Commonly known as: FOLVITE Take 1 tablet (1 mg total) by mouth daily.    IRON PO Take 1 tablet by mouth daily.    mirtazapine 7.5 MG tablet Commonly known as: REMERON Take 7.5 mg by mouth at bedtime.    polyethylene glycol 17 g packet Commonly known as: MIRALAX / GLYCOLAX Take 17 g  by mouth daily as needed.    predniSONE 5 MG tablet Commonly known as: DELTASONE Take 2 tablets (10 mg total) by mouth daily with breakfast.    rosuvastatin 40 MG tablet Commonly known as: CRESTOR Take 40 mg by mouth daily.    sodium bicarbonate 650 MG tablet Take 1 tablet (650 mg total) by mouth 2 (two) times daily.    Vitamin D-3 125 MCG (5000 UT) Tabs Take 1 tablet by mouth daily.             Allergies: No Known Allergies   Family History:      Family History  Problem Relation Age of Onset   Heart disease Mother     Diabetes Mother     Breast cancer Mother     Kidney disease Father     Diabetes Father     Diabetes Daughter        Social History:  reports that he quit smoking about 44 years ago. His smoking use included cigarettes. He has a 3.50 pack-year smoking history. He has been exposed to tobacco smoke. He has never used smokeless tobacco. He reports that he does not currently use alcohol. He  reports that he does not use drugs.     Physical Exam: BP 126/67   Pulse 78   Ht '5\' 11"'$  (1.803 m)   Wt 182 lb (82.6 kg)   BMI 25.38 kg/m   Constitutional:  Alert and oriented, No acute distress.  Accompanied by his wife today. Respiratory: Normal respiratory effort, no increased work of breathing. GI: Abdomen is soft, nontender, nondistended, no abdominal masses Skin: No rashes, bruises or suspicious lesions. Neurologic: Grossly intact, no focal deficits, moving all 4 extremities. Psychiatric: Normal mood and affect.   Laboratory Data: Recent Labs       Lab Results  Component Value Date    WBC 4.1 04/13/2022    HGB 7.9 (L) 04/13/2022    HCT 24.9 (L) 04/13/2022    MCV 85.3 04/13/2022    PLT 157 04/13/2022        Recent Labs       Lab Results  Component Value Date    CREATININE 1.23 04/22/2022          Assessment & Plan:     1. Renal cell cancer, left (HCC) Left renal mass, biopsy proven RCC with evidence of metastatic disease   We discussed  options including continued surveillance versus minimally invasive nephrectomy.  Based on the location of the tumor, he is not a candidate for partial nephrectomy.   Risk of surgery including risk of bleeding, life-threatening bleeding, heart attack stroke, blood clot, infection, damage surrounding structures, hernia formation, ileus amongst others were discussed.   In particular, he is a risk of progression to end-stage renal disease especially in light of his recent episode of AKI.  Being followed by nephrology and has been cleared for surgery, anticipate postprocedural GFR in the 30-40 range.   He has elected to proceed with surgery.     2. History of elevated PSA Recheck PSA in 12/23, consider biopsy based on results   Will address #1 first   3. AKI (acute kidney injury) ([place]) As above       Hollice Espy, MD   [facility] [address] [place], [place] [address] [phone]   I spent 45 total minutes on the day of the encounter including pre-visit review of the medical record, face-to-face time with the patient, and post visit ordering of labs/imaging/tests.

## 2022-07-13 NOTE — Anesthesia Preprocedure Evaluation (Addendum)
Anesthesia Evaluation  Patient identified by MRN, date of birth, ID band Patient awake    Reviewed: Allergy & Precautions, NPO status , Patient's Chart, lab work & pertinent test results  History of Anesthesia Complications Negative for: history of anesthetic complications  Airway Mallampati: II  TM Distance: >3 FB Neck ROM: full    Dental  (+) Edentulous Upper, Edentulous Lower   Pulmonary former smoker   Pulmonary exam normal        Cardiovascular hypertension, Pt. on medications Normal cardiovascular exam     Neuro/Psych CVA  negative psych ROS   GI/Hepatic negative GI ROS, Neg liver ROS,,,  Endo/Other  diabetes    Renal/GU Renal disease     Musculoskeletal  (+) Arthritis , Rheumatoid disorders,  On prednisone '5mg'$  BID    Abdominal   Peds  Hematology negative hematology ROS (+)   Anesthesia Other Findings Past Medical History: No date: Anemia No date: Aortic atherosclerosis (HCC) No date: Atrial fibrillation ([place])     Comment:  a.) CHA2DS2VASc = 5 (age, HTN, CVA x 2, vascular disease              history);  b.) rate/rhythm maintained without               pharmacological intervention; chronically anticoagulated               with clopidogrel 05/29/2022: Bilateral renal cysts 04/11/2022: Carotid artery disease ([place])     Comment:  a.) doppler 17/00/1749: <44 LICA, no sig RICA. No date: CVA (cerebral vascular accident) [facility])     Comment:  a.) MRI brain 01/14/2022: numerous chronic cerebellar               infarcts; b.) CT head 04/10/2022: small old lacunar               infarcts are seen in cerebellum bilaterally 96/75/9163: Diastolic dysfunction     Comment:  a.) TTE 04/11/2022: EF 55-60%, mild BAE, mild MR, G1DD. No date: HLD (hyperlipidemia) 06/02/2016: Hypertension No date: Long term current use of antithrombotics/antiplatelets     Comment:  a.) clopidogrel No date: Long term current use of systemic  steroids     Comment:  a.) prednisone for diagnosis of RA No date: Neutropenia ([place]) 04/10/2022: Renal cell cancer, left ([place])     Comment:  a.) renal US 04/10/2022: solid heterogenous LEFT renal               mass measuring 5.2 cm; b.) MRI ABD - 5.3 x 4.3 cm renal               mass to posterior lip of LEFT kidney extending into renal              sinus; c.)  renal Bx  05/06/2022 - (+) for RCC with clear              cell features 06/02/2016: Rheumatoid arthritis involving multiple sites with  positive rheumatoid factor (HCC) No date: T2DM (type 2 diabetes mellitus) ([place]) 06/01/2022: Thoracic ascending aortic aneurysm ([place])     Comment:  a.) CT chest - measured 4.2 cm  Past Surgical History: 05/2022: RENAL BIOPSY  BMI    Body Mass Index: 24.41 kg/m      Reproductive/Obstetrics negative OB ROS                             Anesthesia Physical Anesthesia Plan  ASA: 3  Anesthesia Plan: General ETT   Post-op Pain Management: Ofirmev IV (intra-op)* and Dilaudid IV   Induction: Intravenous  PONV Risk Score and Plan: Ondansetron, Dexamethasone, Midazolam and Treatment may vary due to age or medical condition  Airway Management Planned: Oral ETT  Additional Equipment:   Intra-op Plan:   Post-operative Plan: Extubation in OR  Informed Consent: I have reviewed the patients History and Physical, chart, labs and discussed the procedure including the risks, benefits and alternatives for the proposed anesthesia with the patient or authorized representative who has indicated his/her understanding and acceptance.     Dental Advisory Given  Plan Discussed with: Anesthesiologist, CRNA and Surgeon  Anesthesia Plan Comments: (Patient consented for risks of anesthesia including but not limited to:  - adverse reactions to medications - damage to eyes, teeth, lips or other oral mucosa - nerve damage due to positioning  - sore throat or hoarseness - Damage  to heart, brain, nerves, lungs, other parts of body or loss of life  Patient voiced understanding.)       Anesthesia Quick Evaluation

## 2022-07-13 NOTE — Op Note (Signed)
PREOP DIAGNOSIS: Left renal mass  POSTOPERATIVE DIAGNOSIS: Left renal mass  OPERATION PERFORMED: Hand-assisted laparoscopic left radical nephrectomy.   SURGEON: Hollice Espy, MD  Assistant: Nickolas Madrid, MD  ANESTHESIA: General.  ESTIMATED BLOOD LOSS: 25  cc   DRAINS: 16-French Foley catheter.   COMPLICATIONS: None.  Indications: Left RCC, hilar, biopsy proven  FINDINGS: Encapsulated hilar mass  DESCRIPTION OF OPERATION: Informed consent was obtained. The patient was marked on the left side. IV antibiotics were given for bacterial prophylaxis on call to the operating room. SCDs were provided for DVT prophylaxis. The patient was taken to the operating room and placed supine on the operating table. General anesthesia was provided. A Foley catheter was placed to drain the bladder.  The patient was positioned in right lateral decubitus with the left flank elevated about 70 degrees and the table flexed slightly. The left arm was placed in a padded airplane for support. Axillary roll was not placed as the patient was not on true flank position, rather sloppy lateral. The patient was secured to the table with soft straps and then prepped and draped sterilely.  We had a time-out confirming the patient identification, planned procedure, surgical site, and all present were in agreement. All present were in agreement.  A 8 cm incision was made for the hand port in the left lower quadrant. The anterior fascia was incised and elevated. The rectus belly was retracted medially and the peritoneum was incised. An incisional block was provided with liposomal Marcaine. The GelPort was assembled. A 12 mm trocar was placed above the umbilicus, and then the abdomen was insufflated. Laparoscopic survey revealed no abnormalities or injuries. An additional 12 mm trocar was just below  the subxiphoid. All port sites were then infiltrated with liposomal Marcaine. Zero Vicryls were  placed at the 12 mm trocar sites with the Carter-Thomason device for closure at the end of the case.   The white line of Toldt was incised. The colon was reflected from the spleen to the pelvis. Gerota's fascia was lifted up off the lower pole of the kidney and the ureter and gonadal vessels were identified. The gonadal vein was exposed and followed up to the main renal vein. The branch point of the gonadal vein and adrenal vein were exposed at the renal vein. The upper pole of the kidney was mobilized off of the quadratus lumborum and further mobilized away from the spleen.   A staple load, 60 mm vascular load was used to divide this attachment. The adrenal gland was identified and spared during the upper pole dissection. The lower pole and lateral attachments were freed. The kidney was held laterally and the hilar dissection was completed.    The ureter was exposed, clipped distally using weck clip, and divided. The ureter stump was confirmed hemostatic.  At this point, the renal artery and vein were divided with a 60 mm vascular load staple using the battery operated endovascular stapler..  An endocatch bag was then used to bag the specimen.  The kidney was extracted through the gel port and passed off for pathological analysis.   Pneumoperitoneal pressure was reduced to 7 mmHg and the abdomen was inspected; hemostasis was confirmed. The 12 mm trocars were removed and the port sites closed with previously placed 0 Vicryl. The Gelport was removed. The anterior fascia was closed with a running 1 PDS looped after additional liposomal marcaine and  regular marcaine was used. All the incisions were irrigated, patted dry, and then the skin was  reapproximated with 4-0 Monocryl in a subcuticular fashion. The wounds were cleaned and dried and covered with Dermabond. All sponge, needle, and instrument counts were reported correct x2.   The patient was awakened from anesthesia and transferred  to recovery in stable condition. There were no complications. The patient tolerated the procedure well.  An assistant was required for this surgical procedure. The duties of the assistant included but were not limited to suctioning, passing suture, camera manipulation, retraction. This procedure would not be able to be performed without an assistant.   ______________________________    Hollice Espy, MD

## 2022-07-13 NOTE — Anesthesia Postprocedure Evaluation (Signed)
Anesthesia Post Note  Patient: Michael Hinton.  Procedure(s) Performed: HAND ASSISTED LAPAROSCOPIC RADICAL NEPHRECTOMY (Left)  Patient location during evaluation: Endoscopy Anesthesia Type: General Level of consciousness: awake and alert Pain management: pain level controlled Vital Signs Assessment: post-procedure vital signs reviewed and stable Respiratory status: spontaneous breathing, nonlabored ventilation, respiratory function stable and patient connected to nasal cannula oxygen Cardiovascular status: blood pressure returned to baseline and stable Postop Assessment: no apparent nausea or vomiting Anesthetic complications: no   No notable events documented.   Last Vitals:  Vitals:   07/13/22 1448 07/13/22 1500  BP:  (!) 156/86  Pulse: 78 79  Resp: 18 (!) 21  Temp:    SpO2: 99% 100%    Last Pain:  Vitals:   07/13/22 1500  TempSrc:   PainSc: Asleep                 Ilene Qua

## 2022-07-14 ENCOUNTER — Encounter: Payer: Self-pay | Admitting: Urology

## 2022-07-14 DIAGNOSIS — C642 Malignant neoplasm of left kidney, except renal pelvis: Principal | ICD-10-CM

## 2022-07-14 LAB — CBC
HCT: 30.6 % — ABNORMAL LOW (ref 39.0–52.0)
Hemoglobin: 9.3 g/dL — ABNORMAL LOW (ref 13.0–17.0)
MCH: 26.5 pg (ref 26.0–34.0)
MCHC: 30.4 g/dL (ref 30.0–36.0)
MCV: 87.2 fL (ref 80.0–100.0)
Platelets: 178 10*3/uL (ref 150–400)
RBC: 3.51 MIL/uL — ABNORMAL LOW (ref 4.22–5.81)
RDW: 16.3 % — ABNORMAL HIGH (ref 11.5–15.5)
WBC: 4.6 10*3/uL (ref 4.0–10.5)
nRBC: 0 % (ref 0.0–0.2)

## 2022-07-14 LAB — BASIC METABOLIC PANEL
Anion gap: 4 — ABNORMAL LOW (ref 5–15)
BUN: 23 mg/dL (ref 8–23)
CO2: 23 mmol/L (ref 22–32)
Calcium: 8.2 mg/dL — ABNORMAL LOW (ref 8.9–10.3)
Chloride: 111 mmol/L (ref 98–111)
Creatinine, Ser: 1.46 mg/dL — ABNORMAL HIGH (ref 0.61–1.24)
GFR, Estimated: 52 mL/min — ABNORMAL LOW (ref >60)
Glucose, Bld: 97 mg/dL (ref 70–99)
Potassium: 3.5 mmol/L (ref 3.5–5.1)
Sodium: 138 mmol/L (ref 135–145)

## 2022-07-14 MED ORDER — OXYCODONE-ACETAMINOPHEN 5-325 MG PO TABS: 1.0000 | ORAL_TABLET | Freq: Four times a day (QID) | ORAL | 0 refills | Status: DC | PRN

## 2022-07-14 MED ORDER — DOCUSATE SODIUM 100 MG PO CAPS: 100.0000 mg | ORAL_CAPSULE | Freq: Two times a day (BID) | ORAL | 0 refills | Status: DC

## 2022-07-14 NOTE — Discharge Summary (Signed)
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   Weakness    AKI (acute kidney injury) ([place])    Normocytic anemia    Dysphagia    Unintentional weight loss    Hypercalcemia    Renal mass 04/10/2022   Vitamin B12 deficiency 11/04/2021   DDD (degenerative disc disease), lumbar 03/11/2018   ANA positive 01/22/2017   Leucopenia 01/22/2017   Contracture of elbow 08/23/2016   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 Briefly, Michael Farrelly. is a 71 y.o. year old patient admitted on 07/13/2022 for scheduled hand-assisted laparoscopic left radial nephrectomy with Dr. Erlene Quan for management of a left renal mass.   This morning he reports some well-controlled postoperative abdominal pain. He has tolerated crackers without nausea or vomiting. AM labs with creatinine up, 1.46, Hgb down 9.3. He has not yet passed flatus. Foley catheter in place draining clear, yellow urine. Diet advanced and Foley discontinued this morning.  Physical Exam: Constitutional:  Alert and oriented, no acute distress, nontoxic appearing HEENT: [place], AT Cardiovascular: No clubbing, cyanosis, or edema Respiratory: Normal respiratory effort, no increased work of breathing GI: Abdomen is soft with normal postoperative tenderness without rebound or rigidity. No flank ecchymosis. Abdominal incisions noted over the anterior abdomen, all clean, dry, and intact with overlying surgical  adhesive. Lymph: No cervical or inguinal lymphadenopathy Skin: No rashes, bruises or suspicious lesions Neurologic: Grossly intact, no focal deficits, moving all 4 extremities Psychiatric: Normal mood and affect   Hospital Course: Patient tolerated the procedure well.  He was then transferred to the floor after an uneventful PACU stay.  His hospital course was uncomplicated.  On POD#1 he had met discharge criteria: was eating a regular diet, was up and ambulating independently,  pain was well controlled, was voiding without a catheter, and was ready for discharge.  Laboratory values:  Recent Labs    07/14/22 0626  WBC 4.6  HGB 9.3*  HCT 30.6*   Recent Labs    07/14/22 0626  NA 138  K 3.5  CL 111  CO2 23  GLUCOSE 97  BUN 23  CREATININE 1.46*  CALCIUM 8.2*   Results for orders placed or performed during the hospital encounter of 07/07/22  Urine Culture     Status: None   Collection Time: 07/07/22  9:41 AM   Specimen: Urine, Clean Catch  Result Value Ref Range Status   Specimen Description   Final    URINE, CLEAN CATCH Performed at [facility], [address] [place], [place] [address]    Special Requests   Final    NONE Performed at [facility], [address] [place], [place] [address]    Culture   Final    NO GROWTH Performed at [facility], [place] [address] [place], [place] [address]    Report Status 07/08/2022 FINAL  Final   Disposition: Home  Discharge instruction: The patient was instructed to be ambulatory but told to refrain from heavy  lifting, strenuous activity, or driving.  Discharge medications:  Allergies as of 07/14/2022   No Known Allergies      Medication List     TAKE these medications    amLODipine 10 MG tablet Commonly known as: NORVASC Take 10 mg by mouth every morning.   clopidogrel 75 MG tablet Commonly known as: PLAVIX Take 75 mg by mouth daily.   docusate sodium 100 MG capsule Commonly known as:  COLACE Take 1 capsule (100 mg total) by mouth 2 (two) times daily.   folic acid 1 MG tablet Commonly known as: FOLVITE Take 1 tablet (1 mg total) by mouth daily.   IRON PO Take 1 tablet by mouth daily.   mirtazapine 7.5 MG tablet Commonly known as: REMERON Take 7.5 mg by mouth at bedtime.   oxyCODONE-acetaminophen 5-325 MG tablet Commonly known as: PERCOCET/ROXICET Take 1-2 tablets by mouth every 6 (six) hours as needed for moderate pain.   polyethylene glycol 17 g packet Commonly known as: MIRALAX / GLYCOLAX Take 17 g by mouth daily as needed.   predniSONE 5 MG tablet Commonly known as: DELTASONE Take 2 tablets (10 mg total) by mouth daily with breakfast.   rosuvastatin 40 MG tablet Commonly known as: CRESTOR Take 40 mg by mouth daily.   sodium bicarbonate 650 MG tablet Take 1 tablet (650 mg total) by mouth 2 (two) times daily.   Vitamin D-3 125 MCG (5000 UT) Tabs Take 1 tablet by mouth daily.        Followup:   Follow-up Information     Hollice Espy, MD. Go on 08/18/2022.   Specialty: Urology Why: For postop follow-up Contact information: [address] [place] [address] [phone]

## 2022-07-14 NOTE — Plan of Care (Signed)
  Problem: Education: Goal: Knowledge of the prescribed therapeutic regimen will improve Outcome: Progressing   Problem: Bowel/Gastric: Goal: Gastrointestinal status for postoperative course will improve Outcome: Progressing   Problem: Clinical Measurements: Goal: Postoperative complications will be avoided or minimized Outcome: Progressing   Problem: Respiratory: Goal: Ability to achieve and maintain a regular respiratory rate will improve Outcome: Progressing   Problem: Skin Integrity: Goal: Demonstration of wound healing without infection will improve Outcome: Progressing   Problem: Urinary Elimination: Goal: Ability to avoid or minimize complications of infection will improve Outcome: Progressing Goal: Ability to achieve and maintain urine output will improve Outcome: Progressing   Problem: Education: Goal: Knowledge of General Education information will improve Description: Including pain rating scale, medication(s)/side effects and non-pharmacologic comfort measures Outcome: Progressing   Problem: Health Behavior/Discharge Planning: Goal: Ability to manage health-related needs will improve Outcome: Progressing   Problem: Clinical Measurements: Goal: Ability to maintain clinical measurements within normal limits will improve Outcome: Progressing Goal: Will remain free from infection Outcome: Progressing Goal: Diagnostic test results will improve Outcome: Progressing Goal: Respiratory complications will improve Outcome: Progressing Goal: Cardiovascular complication will be avoided Outcome: Progressing   Problem: Activity: Goal: Risk for activity intolerance will decrease Outcome: Progressing   Problem: Nutrition: Goal: Adequate nutrition will be maintained Outcome: Progressing   Problem: Coping: Goal: Level of anxiety will decrease Outcome: Progressing   Problem: Elimination: Goal: Will not experience complications related to bowel motility Outcome:  Progressing Goal: Will not experience complications related to urinary retention Outcome: Progressing   Problem: Pain Managment: Goal: General experience of comfort will improve Outcome: Progressing   Problem: Safety: Goal: Ability to remain free from injury will improve Outcome: Progressing   Problem: Skin Integrity: Goal: Risk for impaired skin integrity will decrease Outcome: Progressing

## 2022-07-14 NOTE — Discharge Instructions (Addendum)
You may resume your Plavix tomorrow. I've send in pain medication for you to take once you get home; when your pain improves you may switch to over-the-counter Tylenol instead.   Since you only have one kidney now, it is important that you avoid medications that are harsh on the kidneys including nonsteroidal antiinflammatory drugs (NSAIDS) including ibuprofen (Advil, Motrin) and naproxen (Aleve), and aspirin.

## 2022-07-14 NOTE — Progress Notes (Signed)
Nutrition Brief Note  Patient identified on the Malnutrition Screening Tool (MST) Report  Wt Readings from Last 15 Encounters:  07/13/22 79.4 kg  07/06/22 79.4 kg  06/03/22 82.6 kg  05/27/22 82.6 kg  05/14/22 81.6 kg  05/06/22 81.6 kg  04/22/22 80.7 kg  04/10/22 79.4 kg  02/17/22 85.7 kg  12/09/21 87.1 kg  11/04/21 87.1 kg  10/28/21 87.4 kg  10/07/21 87.6 kg  09/16/21 89.9 kg  06/04/21 88.5 kg   Pt with incidental left renal mass renal mass who returns today s/p biopsy.   12/4- s/p Hand-assisted laparoscopic left radical nephrectomy   Pt resting quietly at time of visit. He did not appear to have any signs of fat or muscle depletions.   Reviewed wt hx; wt has been stable over the past 3 months.   Current diet order is regular, patient is consuming approximately 100% of meals at this time. Labs and medications reviewed.   No nutrition interventions warranted at this time. If nutrition issues arise, please consult RD.   Loistine Chance, RD, LDN, [place] Registered Dietitian II Certified Diabetes Care and Education Specialist Please refer to [facility] for RD and/or RD on-call/weekend/after hours pager

## 2022-07-15 LAB — BPAM RBC
Blood Product Expiration Date: 202401032359
Blood Product Expiration Date: 202401052359
Unit Type and Rh: 5100
Unit Type and Rh: 5100

## 2022-07-15 LAB — TYPE AND SCREEN
ABO/RH(D): B POS
Antibody Screen: NEGATIVE
Unit division: 0
Unit division: 0

## 2022-07-15 LAB — PREPARE RBC (CROSSMATCH)

## 2022-07-23 ENCOUNTER — Other Ambulatory Visit: Payer: Medicare PPO

## 2022-07-23 ENCOUNTER — Encounter: Payer: Self-pay | Admitting: Physician Assistant

## 2022-07-23 ENCOUNTER — Ambulatory Visit: Payer: Medicare PPO | Attending: Physician Assistant | Admitting: Physician Assistant

## 2022-07-23 VITALS — BP 138/75 | HR 74 | Resp 14 | Ht 71.0 in | Wt 185.4 lb

## 2022-07-23 DIAGNOSIS — E559 Vitamin D deficiency, unspecified: Secondary | ICD-10-CM

## 2022-07-23 DIAGNOSIS — M48061 Spinal stenosis, lumbar region without neurogenic claudication: Secondary | ICD-10-CM

## 2022-07-23 DIAGNOSIS — D702 Other drug-induced agranulocytosis: Secondary | ICD-10-CM

## 2022-07-23 DIAGNOSIS — M0579 Rheumatoid arthritis with rheumatoid factor of multiple sites without organ or systems involvement: Secondary | ICD-10-CM | POA: Diagnosis not present

## 2022-07-23 DIAGNOSIS — Z7952 Long term (current) use of systemic steroids: Secondary | ICD-10-CM

## 2022-07-23 DIAGNOSIS — N2889 Other specified disorders of kidney and ureter: Secondary | ICD-10-CM | POA: Diagnosis not present

## 2022-07-23 DIAGNOSIS — Z79899 Other long term (current) drug therapy: Secondary | ICD-10-CM

## 2022-07-23 DIAGNOSIS — M24562 Contracture, left knee: Secondary | ICD-10-CM

## 2022-07-23 DIAGNOSIS — I10 Essential (primary) hypertension: Secondary | ICD-10-CM

## 2022-07-23 DIAGNOSIS — M24522 Contracture, left elbow: Secondary | ICD-10-CM

## 2022-07-23 DIAGNOSIS — R768 Other specified abnormal immunological findings in serum: Secondary | ICD-10-CM

## 2022-07-23 DIAGNOSIS — M24521 Contracture, right elbow: Secondary | ICD-10-CM

## 2022-07-23 DIAGNOSIS — M24561 Contracture, right knee: Secondary | ICD-10-CM

## 2022-07-23 DIAGNOSIS — M5136 Other intervertebral disc degeneration, lumbar region: Secondary | ICD-10-CM

## 2022-07-24 LAB — SURGICAL PATHOLOGY

## 2022-07-28 ENCOUNTER — Ambulatory Visit: Payer: Medicare PPO | Admitting: Urology

## 2022-08-18 ENCOUNTER — Ambulatory Visit: Payer: Medicare PPO | Admitting: Urology

## 2022-08-26 ENCOUNTER — Ambulatory Visit (INDEPENDENT_AMBULATORY_CARE_PROVIDER_SITE_OTHER): Payer: Medicare PPO | Admitting: Urology

## 2022-08-26 VITALS — BP 126/65 | HR 73 | Ht 71.0 in | Wt 192.0 lb

## 2022-08-26 DIAGNOSIS — Z905 Acquired absence of kidney: Secondary | ICD-10-CM

## 2022-08-26 DIAGNOSIS — R972 Elevated prostate specific antigen [PSA]: Secondary | ICD-10-CM

## 2022-08-26 DIAGNOSIS — Z87898 Personal history of other specified conditions: Secondary | ICD-10-CM

## 2022-08-26 DIAGNOSIS — N183 Chronic kidney disease, stage 3 unspecified: Secondary | ICD-10-CM

## 2022-08-26 DIAGNOSIS — C642 Malignant neoplasm of left kidney, except renal pelvis: Secondary | ICD-10-CM

## 2022-08-26 NOTE — Patient Instructions (Signed)
Prostate Biopsy Instructions  Stop all aspirin or blood thinners (aspirin, plavix, coumadin, warfarin, motrin, ibuprofen, advil, aleve, naproxen, naprosyn) for 7 days prior to the procedure.  If you have any questions about stopping these medications, please contact your primary care physician or cardiologist.  Having a light meal prior to the procedure is recommended.  If you are diabetic or have low blood sugar please bring a small snack or glucose tablet.  A Fleets enema is needed to be purchased over the counter at a local pharmacy and used 2 hours before you scheduled appointment.  This can be purchased over the counter at any pharmacy.  Antibiotics will be administered in the clinic at the time of the procedure unless otherwise specified.    Please bring someone with you to the procedure to drive you home.  A follow up appointment has been scheduled for you to receive the results of the biopsy.  If you have any questions or concerns, please feel free to call the office at [phone] or send a Mychart message.    Thank you, Staff at [facility]  

## 2022-08-26 NOTE — Progress Notes (Signed)
I, Jeanmarie Hubert Maxie,acting as a scribe for Hollice Espy, MD.,have documented all relevant documentation on the behalf of Hollice Espy, MD,as directed by  Hollice Espy, MD while in the presence of Hollice Espy, MD.   08/26/22  3:47 PM   [name]. [birth year] [id number]  Referring provider: Danelle Berry, NP [address] [place],  [place] [address]  Chief Complaint  Patient presents with   Post-op Follow-up    HPI: 72 year-old male with a personal history of left renal mass, who returns today for a routine post-op visit.   He underwent left laparoscopic hand assisted radical nephrectomy on 07/13/2022. His post operative course was unremarkable. 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 The tumor was organ confined, measuring 5.5 cm in greatest diameter, with no renal sinus involvement with negative margins. Pathologic stage was pT1B.   He also has a personal history of acute kidney injury and renal failure. Baseline creatinine going into surgery was normal at 1.23. His most recent creatinine was 1.74 on 08/04/2022 and he is being followed by nephrology for this.   He is doing well since the procedure.   He also has a personal history of elevated PSA, was 7.2 on 9/23 prior to the above.  Due for repeat today based on our previous plan.  PMH: Past Medical History:  Diagnosis Date   Anemia    Aortic atherosclerosis ([place])    Atrial fibrillation ([place])    a.) CHA2DS2VASc = 5 (age, HTN, CVA x 2, vascular disease history);  b.) rate/rhythm maintained without pharmacological intervention; chronically anticoagulated with clopidogrel   Bilateral renal cysts 05/29/2022   Carotid artery disease ([place]) 04/11/2022   a.) doppler 19/14/7829: <56 LICA, no sig RICA.   CVA (cerebral vascular accident) [facility])    a.) MRI brain 01/14/2022: numerous chronic cerebellar infarcts; b.) CT head 04/10/2022: small old lacunar infarcts are seen in  cerebellum bilaterally   Diastolic dysfunction 21/30/8657   a.) TTE 04/11/2022: EF 55-60%, mild BAE, mild MR, G1DD.   HLD (hyperlipidemia)    Hypertension 06/02/2016   Long term current use of antithrombotics/antiplatelets    a.) clopidogrel   Long term current use of systemic steroids    a.) prednisone for diagnosis of RA   Neutropenia ([facility])    Renal cell cancer, left ([place]) 04/10/2022   a.) renal US 04/10/2022: solid heterogenous LEFT renal mass measuring 5.2 cm; b.) MRI ABD - 5.3 x 4.3 cm renal mass to posterior lip of LEFT kidney extending into renal sinus; c.)  renal Bx  05/06/2022 - (+) for RCC with clear cell features   Rheumatoid arthritis involving multiple sites with positive rheumatoid factor ([place]) 06/02/2016   T2DM (type 2 diabetes mellitus) ([place])    Thoracic ascending aortic aneurysm ([place]) 06/01/2022   a.) CT chest - measured 4.2 cm    Surgical History: Past Surgical History:  Procedure Laterality Date   LAPAROSCOPIC NEPHRECTOMY, HAND ASSISTED Left 07/13/2022   Procedure: HAND ASSISTED LAPAROSCOPIC RADICAL NEPHRECTOMY;  Surgeon: Hollice Espy, MD;  Location: [facility] ORS;  Service: Urology;  Laterality: Left;   RENAL BIOPSY  05/2022    Home Medications:  Allergies as of 08/26/2022   No Known Allergies      Medication List        Accurate as of August 26, 2022  3:47 PM. If you have any questions, ask your nurse or doctor.          amLODipine  10 MG tablet Commonly known as: NORVASC Take 10 mg by mouth every morning.   clopidogrel 75 MG tablet Commonly known as: PLAVIX Take 75 mg by mouth daily.   docusate sodium 100 MG capsule Commonly known as: COLACE Take 1 capsule (100 mg total) by mouth 2 (two) times daily.   folic acid 1 MG tablet Commonly known as: FOLVITE Take 1 tablet (1 mg total) by mouth daily.   IRON PO Take 1 tablet by mouth daily.   mirtazapine 7.5 MG tablet Commonly known as: REMERON Take 7.5 mg by mouth at bedtime.    oxyCODONE-acetaminophen 5-325 MG tablet Commonly known as: PERCOCET/ROXICET Take 1-2 tablets by mouth every 6 (six) hours as needed for moderate pain.   polyethylene glycol 17 g packet Commonly known as: MIRALAX / GLYCOLAX Take 17 g by mouth daily as needed.   predniSONE 5 MG tablet Commonly known as: DELTASONE Take 2 tablets (10 mg total) by mouth daily with breakfast.   rosuvastatin 40 MG tablet Commonly known as: CRESTOR Take 40 mg by mouth daily.   sodium bicarbonate 650 MG tablet Take 1 tablet (650 mg total) by mouth 2 (two) times daily.   Vitamin D-3 125 MCG (5000 UT) Tabs Take 1 tablet by mouth daily.        Family History: Family History  Problem Relation Age of Onset   Heart disease Mother    Diabetes Mother    Breast cancer Mother    Kidney disease Father    Diabetes Father    Diabetes Daughter     Social History:  reports that he quit smoking about 44 years ago. His smoking use included cigarettes. He has a 3.50 pack-year smoking history. He has been exposed to tobacco smoke. He has never used smokeless tobacco. He reports that he does not currently use alcohol. He reports that he does not use drugs.   Physical Exam: BP 126/65   Pulse 73   Ht '5\' 11"'$  (1.803 m)   Wt 192 lb (87.1 kg)   BMI 26.78 kg/m   Constitutional:  Alert and oriented, No acute distress. HEENT: [place] AT, moist mucus membranes.  Trachea midline, no masses. Skin: Well healing incisions post nephrectomy. Neurologic: Grossly intact, no focal deficits, moving all 4 extremities. Psychiatric: Normal mood and affect.  Assessment & Plan:    1. Left renal cell carcinoma - Status post uncomplicated radical nephrectomy - Will plan for CT abdomen/pelvis and a chest x-ray in one year for surveillance. No further therapy is indicated.  2. Chronic kidney disease, stage 3 - Status post nephrectomy. Solitary kidney precautions were reviewed. - Continue to follow up with nephrology.  3. Elevated  PSA - Previously elevated, we elected to repeat PSA today and contact him with the result and plan. We may consider biopsy versus MRI versus surveillance depending on the result. They are agreeable with this plan.   Return in 1 year (on 08/27/2023) for CT abdomen/pelvis and chest x-ray prior. Or sooner pending repeat PSA  I have reviewed the above documentation for accuracy and completeness, and I agree with the above.   Hollice Espy, MD   [facility] [address] [place], [place] [address] [phone]

## 2022-08-27 ENCOUNTER — Telehealth: Payer: Self-pay

## 2022-08-27 DIAGNOSIS — R972 Elevated prostate specific antigen [PSA]: Secondary | ICD-10-CM

## 2022-08-27 LAB — PSA: Prostate Specific Ag, Serum: 8.1 ng/mL — ABNORMAL HIGH (ref 0.0–4.0)

## 2022-08-27 NOTE — Telephone Encounter (Signed)
PSA keeps going up.  In light of very recent surgery, lets start with a prostate MRI to get a picture of what is going on in the prostate.  If this is abnormal, we may consider proceeding with biopsy.   He is order prostate MRI with and without and have him follow-up with me in about a month to discuss.   Hollice Espy, MD  Left message to call back

## 2022-08-28 NOTE — Telephone Encounter (Signed)
Spoke with Mrs Michael Hinton, ok per DPR on file, ok with proceeding for MRI. Order placed and follow up appointment made.

## 2022-08-28 NOTE — Addendum Note (Signed)
Addended by: Kris Mouton on: 08/28/2022 11:44 AM   Modules accepted: Orders

## 2022-08-31 ENCOUNTER — Other Ambulatory Visit: Payer: Self-pay | Admitting: Physician Assistant

## 2022-08-31 NOTE — Telephone Encounter (Signed)
Next Visit: 10/23/2022  Last Visit: 07/23/2022  Last Fill: 05/14/2022  Dx: Rheumatoid arthritis involving multiple sites with positive rheumatoid factor   Current Dose per office note on 07/23/2022: prednisone 10 mg daily   Okay to refill Prednisone?

## 2022-09-08 ENCOUNTER — Ambulatory Visit: Admission: RE | Admit: 2022-09-08 | Payer: Medicare PPO | Source: Ambulatory Visit

## 2022-09-15 ENCOUNTER — Ambulatory Visit
Admission: RE | Admit: 2022-09-15 | Discharge: 2022-09-15 | Disposition: A | Discharge status: Home / Self Care | Payer: Medicare PPO | Source: Ambulatory Visit | Attending: Urology | Admitting: Urology

## 2022-09-15 DIAGNOSIS — R972 Elevated prostate specific antigen [PSA]: Secondary | ICD-10-CM | POA: Diagnosis present

## 2022-09-15 MED ORDER — GADOBUTROL 1 MMOL/ML IV SOLN
9.0000 mL | Freq: Once | INTRAVENOUS | Status: AC | PRN
  Administered 2022-09-15: 9 mL via INTRAVENOUS

## 2022-09-22 ENCOUNTER — Telehealth: Payer: Self-pay | Admitting: Urology

## 2022-09-22 DIAGNOSIS — R0789 Other chest pain: Secondary | ICD-10-CM

## 2022-09-22 DIAGNOSIS — J9811 Atelectasis: Secondary | ICD-10-CM

## 2022-09-22 NOTE — Telephone Encounter (Signed)
Please let [name] know that it is time to repeat his chest CT.  I have placed orders and the scheduling department will call him to schedule. On the CT in October there were findings of possible lung damage and we need to make sure that it has healed.

## 2022-09-23 NOTE — Telephone Encounter (Signed)
Patient's wife notified and voiced understanding.

## 2022-09-30 ENCOUNTER — Ambulatory Visit (INDEPENDENT_AMBULATORY_CARE_PROVIDER_SITE_OTHER): Payer: Medicare PPO | Admitting: Urology

## 2022-09-30 VITALS — BP 152/76 | HR 76 | Ht 71.0 in | Wt 203.2 lb

## 2022-09-30 DIAGNOSIS — R972 Elevated prostate specific antigen [PSA]: Secondary | ICD-10-CM

## 2022-09-30 NOTE — Progress Notes (Signed)
Haze Rushing Plume,acting as a scribe for Michael Espy, MD.,have documented all relevant documentation on the behalf of Michael Espy, MD,as directed by  Michael Espy, MD while in the presence of Michael Espy, MD.  09/30/2022 3:38 PM   Michael Hinton. [birth year] [id number]  Referring provider: Evern Bio, NP [address] [place],  [place] [address]  Chief Complaint  Patient presents with   renal cell carcinoma    Follow up    HPI: 72 year-old male with a personal history of renal cell carcinoma and elevated PSA. He returns primarily to discuss his elevated PSA. His PSA was known to be elevated and rising at 7.2 on 04/22/2022 and 8.1 on 08/26/2022.   He underwent a prostate MRI, which was completed on 09/15/2022. This showed a 21 gram prostate with heterogenous signal intensity possibly representing previous prostate inflammation. He has a mildly enlarged nodular transition zone. It was PIRADS 2.   He is scheduled for a CT of his abdomen, pelvis, and chest on 07/14/2023 for a history of renal cell carcinoma.   PMH: Past Medical History:  Diagnosis Date   Anemia    Aortic atherosclerosis ([place])    Atrial fibrillation ([place])    a.) CHA2DS2VASc = 5 (age, HTN, CVA x 2, vascular disease history);  b.) rate/rhythm maintained without pharmacological intervention; chronically anticoagulated with clopidogrel   Bilateral renal cysts 05/29/2022   Carotid artery disease ([place]) 04/11/2022   a.) doppler [id number]: [id number] LICA, no sig RICA.   CVA (cerebral vascular accident) [facility])    a.) MRI brain 01/14/2022: numerous chronic cerebellar infarcts; b.) CT head 04/10/2022: small old lacunar infarcts are seen in cerebellum bilaterally   Diastolic dysfunction [id number]   a.) TTE 04/11/2022: EF 55-60%, mild BAE, mild MR, G1DD.   HLD (hyperlipidemia)    Hypertension 06/02/2016   Long term current use of antithrombotics/antiplatelets    a.) clopidogrel   Long term current use of systemic  steroids    a.) prednisone for diagnosis of RA   Neutropenia (HCC)    Renal cell cancer, left ([place]) 04/10/2022   a.) renal US 04/10/2022: solid heterogenous LEFT renal mass measuring 5.2 cm; b.) MRI ABD - 5.3 x 4.3 cm renal mass to posterior lip of LEFT kidney extending into renal sinus; c.)  renal Bx  05/06/2022 - (+) for RCC with clear cell features   Rheumatoid arthritis involving multiple sites with positive rheumatoid factor ([place]) 06/02/2016   T2DM (type 2 diabetes mellitus) ([place])    Thoracic ascending aortic aneurysm ([place]) 06/01/2022   a.) CT chest - measured 4.2 cm    Surgical History: Past Surgical History:  Procedure Laterality Date   LAPAROSCOPIC NEPHRECTOMY, HAND ASSISTED Left 07/13/2022   Procedure: HAND ASSISTED LAPAROSCOPIC RADICAL NEPHRECTOMY;  Surgeon: Michael Espy, MD;  Location: [facility] ORS;  Service: Urology;  Laterality: Left;   RENAL BIOPSY  05/2022    Home Medications:  Allergies as of 09/30/2022   No Known Allergies      Medication List        Accurate as of September 30, 2022  3:38 PM. If you have any questions, ask your nurse or doctor.          amLODipine 10 MG tablet Commonly known as: NORVASC Take 10 mg by mouth every morning.   clopidogrel 75 MG tablet Commonly known as: PLAVIX Take 75 mg by mouth daily.   docusate sodium 100 MG capsule Commonly known as: COLACE Take 1 capsule (100 mg total)  by mouth 2 (two) times daily.   folic acid 1 MG tablet Commonly known as: FOLVITE Take 1 tablet (1 mg total) by mouth daily.   IRON PO Take 1 tablet by mouth daily.   mirtazapine 7.5 MG tablet Commonly known as: REMERON Take 7.5 mg by mouth at bedtime.   oxyCODONE-acetaminophen 5-325 MG tablet Commonly known as: PERCOCET/ROXICET Take 1-2 tablets by mouth every 6 (six) hours as needed for moderate pain.   polyethylene glycol 17 g packet Commonly known as: MIRALAX / GLYCOLAX Take 17 g by mouth daily as needed.   predniSONE 5 MG  tablet Commonly known as: DELTASONE TAKE 2 TABLETS BY MOUTH ONCE DAILY WITH BREAKFAST   rosuvastatin 40 MG tablet Commonly known as: CRESTOR Take 40 mg by mouth daily.   sodium bicarbonate 650 MG tablet Take 1 tablet (650 mg total) by mouth 2 (two) times daily.   Vitamin D-3 125 MCG (5000 UT) Tabs Take 1 tablet by mouth daily.        Family History: Family History  Problem Relation Age of Onset   Heart disease Mother    Diabetes Mother    Breast cancer Mother    Kidney disease Father    Diabetes Father    Diabetes Daughter     Social History:  reports that he quit smoking about 44 years ago. His smoking use included cigarettes. He has a 3.50 pack-year smoking history. He has been exposed to tobacco smoke. He has never used smokeless tobacco. He reports that he does not currently use alcohol. He reports that he does not use drugs.   Physical Exam: BP (!) 152/76   Pulse 76   Ht 5' 11"$  (1.803 m)   Wt 203 lb 3.2 oz (92.2 kg)   BMI 28.34 kg/m   Constitutional:  Alert and oriented, No acute distress. HEENT: [place] AT, moist mucus membranes.  Trachea midline, no masses. Neurologic: Grossly intact, no focal deficits, moving all 4 extremities. Psychiatric: Normal mood and affect.   Assessment & Plan:    1. Elevated PSA/ Rising PSA - Rectal exam on 04/22/2022 showed an enlarged, rubbery prostate - His prostate MRI was very reassuring.  - Given his comorbidities, we do think it is reasonable to wait another 3 months to recheck his PSA to see if it stabilizes. If it does not, we should go ahead and proceed with a prostate biopsy. In that situation, we would have to hold his antiplatelet therapy again.  - We will call him in 3 months and let him know what his PSA does. There is no real role for fusion biopsy in this situation given the lack of specific targets.   2. Clear cell renal cell carcinoma - He will follow up as planned. - CT abdomen, pelvis, and chest is scheduled for  07/14/2023.   Return in about 3 months (around 12/29/2022) for repeat PSA.  I have reviewed the above documentation for accuracy and completeness, and I agree with the above.   Michael Espy, MD   [facility] [address] [place], [place] [address] [phone]

## 2022-10-12 NOTE — Progress Notes (Signed)
Office Visit Note  Patient: Michael Hinton.             Date of Birth: 05/20/1951           MRN: [id number]             PCP: Evern Bio, NP Referring: Evern Bio, NP Visit Date: 10/23/2022 Occupation: @GUAROCC @  Subjective:  Discuss medication options   History of Present Illness: Michael Hinton. is a 72 y.o. male with history of seropositive rheumatoid arthritis.  He is currently taking prednisone 10 mg daily.  Patient underwent a radical left nephrectomy by Dr. Erlene Quan on 07/13/2022.  He does not require any further treatment for renal cell carcinoma.  Plan to update CT abdomen and pelvis and chest x-ray in 1 year for surveillance. He has not currently on any immunosuppressive agents.  He continues to experience pain and stiffness in both hands especially first thing in the morning.  He has noticed increased swelling in his right ankle and has ongoing pain and stiffness in the right knee.  He did not find it To Be Effective at Managing His Arthritis.  He Is Open to Discussing Other Treatment Options.   Activities of Daily Living:  Patient reports morning stiffness for 2 hours.   Patient Denies nocturnal pain.  Difficulty dressing/grooming: Denies Difficulty climbing stairs: Denies Difficulty getting out of chair: Denies Difficulty using hands for taps, buttons, cutlery, and/or writing: Reports  Review of Systems  Constitutional:  Negative for fatigue.  HENT:  Negative for mouth sores and mouth dryness.   Eyes:  Negative for dryness.  Respiratory:  Negative for shortness of breath.   Cardiovascular:  Negative for chest pain and palpitations.  Gastrointestinal:  Negative for blood in stool, constipation and diarrhea.  Endocrine: Negative for increased urination.  Genitourinary:  Negative for involuntary urination.  Musculoskeletal:  Positive for joint pain, joint pain, joint swelling and morning stiffness. Negative for gait problem, myalgias, muscle weakness, muscle  tenderness and myalgias.  Skin:  Negative for color change, rash, hair loss and sensitivity to sunlight.  Allergic/Immunologic: Negative for susceptible to infections.  Neurological:  Negative for dizziness and headaches.  Hematological:  Negative for swollen glands.  Psychiatric/Behavioral:  Negative for depressed mood and sleep disturbance. The patient is not nervous/anxious.     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   Rheumatoid arthritis involving multiple sites with positive rheumatoid factor ([place]) 06/02/2016   High risk medication use 06/02/2016   Hypertension 06/02/2016   Vitamin D deficiency 06/02/2016    Past Medical History:  Diagnosis Date   Anemia    Aortic atherosclerosis ([place])    Atrial fibrillation ([place])    a.) CHA2DS2VASc = 5 (age, HTN, CVA x 2, vascular disease history);  b.) rate/rhythm maintained without pharmacological intervention; chronically anticoagulated with clopidogrel   Bilateral renal cysts 05/29/2022   Carotid artery disease ([place]) 04/11/2022   a.) doppler 04/11/2022: <50  LICA, no sig RICA.   CVA (cerebral vascular accident) [facility])    a.) MRI brain 01/14/2022: numerous chronic cerebellar infarcts; b.) CT head 04/10/2022: small old lacunar infarcts are seen in cerebellum bilaterally   Diastolic dysfunction 82/99/3716   a.) TTE 04/11/2022: EF 55-60%, mild BAE, mild MR, G1DD.   HLD (hyperlipidemia)    Hypertension 06/02/2016   Long term current use of  antithrombotics/antiplatelets    a.) clopidogrel   Long term current use of systemic steroids    a.) prednisone for diagnosis of RA   Neutropenia (HCC)    Renal cell cancer, left ([place]) 04/10/2022   a.) renal US 04/10/2022: solid heterogenous LEFT renal mass measuring 5.2 cm; b.) MRI ABD - 5.3 x 4.3 cm renal mass to posterior lip of LEFT kidney extending into renal sinus; c.)  renal Bx  05/06/2022 - (+) for RCC with clear cell features   Rheumatoid arthritis involving multiple sites with positive rheumatoid factor ([place]) 06/02/2016   T2DM (type 2 diabetes mellitus) ([place])    Thoracic ascending aortic aneurysm ([place]) 06/01/2022   a.) CT chest - measured 4.2 cm    Family History  Problem Relation Age of Onset   Heart disease Mother    Diabetes Mother    Breast cancer Mother    Kidney disease Father    Diabetes Father    Diabetes Daughter    Past Surgical History:  Procedure Laterality Date   LAPAROSCOPIC NEPHRECTOMY, HAND ASSISTED Left 07/13/2022   Procedure: HAND ASSISTED LAPAROSCOPIC RADICAL NEPHRECTOMY;  Surgeon: Hollice Espy, MD;  Location: [facility] ORS;  Service: Urology;  Laterality: Left;   RENAL BIOPSY  05/2022   Social History   Social History Narrative   Not on file   Immunization History  Administered Date(s) Administered   Moderna Sars-Covid-2 Vaccination 10/12/2019, 11/15/2019   PPD Test 03/20/2022     Objective: Vital Signs: BP 130/70 (BP Location: Left Arm, Patient Position: Sitting, Cuff Size: Normal)   Pulse 73   Resp 16   Ht 5\' 11"  (1.803 m)   Wt 204 lb (92.5 kg)   BMI 28.45 kg/m    Physical Exam Vitals and nursing note reviewed.  Constitutional:      Appearance: He is well-developed.  HENT:     Head: Normocephalic and atraumatic.  Eyes:     Conjunctiva/sclera: Conjunctivae normal.     Pupils: Pupils are equal, round, and reactive to light.  Cardiovascular:     Rate and Rhythm: Normal rate and regular rhythm.     Heart sounds: Normal heart sounds.   Pulmonary:     Effort: Pulmonary effort is normal.     Breath sounds: Normal breath sounds.  Abdominal:     General: Bowel sounds are normal.     Palpations: Abdomen is soft.  Musculoskeletal:     Cervical back: Normal range of motion and neck supple.  Skin:    General: Skin is warm and dry.     Capillary Refill: Capillary refill takes less than 2 seconds.  Neurological:     Mental Status: He is alert and oriented to person, place, and time.  Psychiatric:        Behavior: Behavior normal.      Musculoskeletal Exam: C-spine has limited range of motion without rotation.  Shoulder joints have good range of motion with some stiffness bilaterally.  Elbow joint flexion contractures noted bilaterally.  Limited range of motion of both wrist joints.  Thickening of both wrists and all MCP joints.  PIP and DIP thickening.  Hip joints have limited range of motion.  Right knee flexion contracture with warmth noted.  Tenderness and synovitis in the right ankle joint noted.  Left ankle has no tenderness or synovitis.  Left knee joint has no warmth or effusion.  CDAI Exam: CDAI Score: 8  Patient Global: 5 mm; Provider Global: 5 mm Swollen: 4 ; Tender: 5  Joint Exam 10/23/2022      Right  Left  MCP 1  Swollen Tender     MCP 2  Swollen Tender     MCP 5     Swollen Tender  Knee   Tender     Ankle  Swollen Tender        Investigation: No additional findings.  Imaging: No results found.  Recent Labs: Lab Results  Component Value Date   WBC 4.6 07/14/2022   HGB 9.3 (L) 07/14/2022   PLT 178 07/14/2022   NA 138 07/14/2022   K 3.5 07/14/2022   CL 111 07/14/2022   CO2 23 07/14/2022   GLUCOSE 97 07/14/2022   BUN 23 07/14/2022   CREATININE 1.46 (H) 07/14/2022   BILITOT 1.1 04/10/2022   ALKPHOS 94 04/10/2022   AST 32 04/10/2022   ALT 22 04/10/2022   PROT 8.7 (H) 04/10/2022   ALBUMIN 3.3 (L) 04/10/2022   CALCIUM 8.2 (L) 07/14/2022   GFRAA 86 02/03/2021   QFTBGOLD NEGATIVE 06/16/2017    QFTBGOLDPLUS INDETERMINATE (A) 03/06/2022    Speciality Comments: TB Skin Test 03/23/2022: Negative  Procedures:  No procedures performed Allergies: Patient has no known allergies.      Assessment / Plan:     Visit Diagnoses: Rheumatoid arthritis involving multiple sites with positive rheumatoid factor [facility]): Patient presents today with ongoing joint pain and stiffness involving multiple joints.  His joint stiffness has been most severe in both hands first thing in the morning.  Flexion contracture of the right knee is unchanged but remains warm with no effusion.  He does have tenderness and synovitis of the right ankle joint on examination today.  He is currently taking prednisone 10 mg daily.  He has been off of Kevzara since August 2023.  He underwent a left radical nephrectomy on 07/13/2022 for left renal cell carcinoma.  He does not require any further treatment at this time.  Plans on having CT abdomen pelvis rechecked in December 2024.  He presents today to discuss other treatment options.  He would like to try tapering off of prednisone.  Discussed the risks of long-term prednisone use and the importance of trying to titrate the dose as tolerated.  Plan to apply for Orencia 125 mg sq injections once every 14 days through his insurance.  Once approved he will return to the office for administration of the first injection.  Plan on spacing the dosing of Orencia to every 14 days given history of chronic lymphopenia.  He will require close lab monitoring.  He will start trying to taper prednisone by 1 mg every month once he has initiated Orencia.  He will follow-up in the office in 6 to 8 weeks to assess his response.  Medication counseling:  TB Gold: PPD skin test 03/23/22.  Hepatitis panel: Hepatitis panel negative on 10/07/2021. HIV: Negative on 10/07/2021. Does patient have a diagnosis of COPD? No Patient had a chest CT performed yesterday.  He will have results forwarded to our office to  review.  Counseled patient that Maureen Chatters is a selective T-cell costimulation blocker indicated for  rheumatoid arthritis.  Counseled patient on purpose, proper use, and adverse effects of Orencia. The most common adverse effects are increased risk of infections, headache, and injection site reactions.  There is the possibility of an increased risk of malignancy but it is not well understood if this increased risk is due to the medication or the disease state.  Reviewed the importance of regular labs while on Orencia therapy.  Counseled patient that Maureen Chatters should be held prior to scheduled surgery.  Counseled patient to avoid live vaccines while on Orencia.  Advised patient to get annual influenza vaccine and the pneumococcal vaccine as indicated.  Provided patient with medication education material and answered all questions.  Patient consented to [facility].  Will upload consent into patient's chart.  Will apply for Orencia through patient's insurance.  Reviewed storage information for Orencia.  Advised initial injection must be administered in office.    High risk medication use -Plan to apply for Orencia through his insurance.  Dose of Orencia will be 125 mg subcutaneous injections every 14 days due to history of lymphopenia.  He will require close lab monitoring. Previous therapy includes Enbrel, Humira, and Kevzara.  He previously had to space dosing due to history of lymphopenia. Darcus Pester was discontinued in August 2023 at which time he had been spacing he had been spacing dosing to every 18 days. CBC and renal function panel updated on 07/15/2022.  Orders for CBC and CMP were released.  He will require updated lab work in 1 month and every 3 months after initiating [place]. TB skin test negative on 03/23/2022 and will continue to be monitored yearly.   - Plan: CBC with Differential/Platelet, COMPLETE METABOLIC PANEL WITH GFR  Long term systemic steroid user: Patient remains on prednisone 10 mg daily.   Reviewed the risks of long-term prednisone use.  Recommend starting to taper the dose of prednisone once he has initiated Orencia.  We can try reducing prednisone by 1 mg every month.  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 CBC with diff updated today.   Plan to space orencia to every 14 days with close lab monitoring  Patient was previously spacing Kevzara dosing to every 18 days.  Patient discontinued Kevzara on 04/09/2022. - Plan: CBC with Differential/Platelet  Renal cell adenoma, left : Incidental finding while hospitalized 9/1 to 04/14/2022.  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 Patient was admitted on 07/13/2022 for scheduled hand-assisted laparoscopic left radical nephrectomy with Dr. Erlene Quan for management.   Reviewed Dr. Cherrie Gauze note from 08/26/2022: Left renal cell carcinoma status post uncomplicated radical nephrectomy on 07/13/2022.  Plan to update CT abdomen and pelvis along with chest x-ray in 1 year for surveillance.  No further therapy is indicated.  ANA positive: No clinical features of systemic lupus.  Contracture of left elbow: Unchanged.   Contracture of right elbow: Unchanged.   Contractures of both knees: Right >left.  Right knee warmth noted.   Vitamin D deficiency: He is taking vitamin D 5000 units daily.   Primary hypertension: Blood pressure is 130/70 today in the office.  Spinal stenosis of lumbar region without neurogenic claudication  DDD (degenerative disc disease), lumbar    Orders: Orders Placed This Encounter  Procedures   CBC with Differential/Platelet   COMPLETE METABOLIC PANEL WITH GFR   No orders of the defined types  were placed in this encounter.     Follow-Up Instructions: Return in about 8 weeks (around 12/18/2022) for Rheumatoid  arthritis.   Ofilia Neas, PA-C  Note - This record has been created using Dragon software.  Chart creation errors have been sought, but may not always  have been located. Such creation errors do not reflect on  the standard of medical care.

## 2022-10-15 ENCOUNTER — Telehealth: Payer: Self-pay

## 2022-10-15 NOTE — Telephone Encounter (Signed)
See phone note, already addressed

## 2022-10-15 NOTE — Telephone Encounter (Signed)
Called received via triage line [phone]  Pts wife Felecia wants to know who they suppose to call to get pts CT scheduled.   Called Felecia ang gave number- [phone]. Advised CT has been authed and Centralized scheduling has called x 2 to get ct scheduled.   She voiced understanding.

## 2022-10-22 ENCOUNTER — Ambulatory Visit
Admission: RE | Admit: 2022-10-22 | Discharge: 2022-10-22 | Disposition: A | Discharge status: Home / Self Care | Payer: Medicare PPO | Source: Ambulatory Visit | Attending: Urology | Admitting: Urology

## 2022-10-22 DIAGNOSIS — J9811 Atelectasis: Secondary | ICD-10-CM | POA: Diagnosis present

## 2022-10-22 DIAGNOSIS — R0789 Other chest pain: Secondary | ICD-10-CM | POA: Diagnosis present

## 2022-10-23 ENCOUNTER — Ambulatory Visit: Payer: Medicare PPO | Attending: Physician Assistant | Admitting: Physician Assistant

## 2022-10-23 ENCOUNTER — Other Ambulatory Visit (HOSPITAL_COMMUNITY): Payer: Self-pay

## 2022-10-23 ENCOUNTER — Telehealth: Payer: Self-pay | Admitting: Pharmacist

## 2022-10-23 ENCOUNTER — Encounter: Payer: Self-pay | Admitting: Physician Assistant

## 2022-10-23 VITALS — BP 130/70 | HR 73 | Resp 16 | Ht 71.0 in | Wt 204.0 lb

## 2022-10-23 DIAGNOSIS — M5136 Other intervertebral disc degeneration, lumbar region: Secondary | ICD-10-CM

## 2022-10-23 DIAGNOSIS — E559 Vitamin D deficiency, unspecified: Secondary | ICD-10-CM

## 2022-10-23 DIAGNOSIS — M24562 Contracture, left knee: Secondary | ICD-10-CM

## 2022-10-23 DIAGNOSIS — M24521 Contracture, right elbow: Secondary | ICD-10-CM

## 2022-10-23 DIAGNOSIS — M0579 Rheumatoid arthritis with rheumatoid factor of multiple sites without organ or systems involvement: Secondary | ICD-10-CM | POA: Diagnosis not present

## 2022-10-23 DIAGNOSIS — R768 Other specified abnormal immunological findings in serum: Secondary | ICD-10-CM

## 2022-10-23 DIAGNOSIS — M48061 Spinal stenosis, lumbar region without neurogenic claudication: Secondary | ICD-10-CM

## 2022-10-23 DIAGNOSIS — M24561 Contracture, right knee: Secondary | ICD-10-CM

## 2022-10-23 DIAGNOSIS — N2889 Other specified disorders of kidney and ureter: Secondary | ICD-10-CM | POA: Diagnosis not present

## 2022-10-23 DIAGNOSIS — D702 Other drug-induced agranulocytosis: Secondary | ICD-10-CM

## 2022-10-23 DIAGNOSIS — Z79899 Other long term (current) drug therapy: Secondary | ICD-10-CM | POA: Diagnosis not present

## 2022-10-23 DIAGNOSIS — D3002 Benign neoplasm of left kidney: Secondary | ICD-10-CM

## 2022-10-23 DIAGNOSIS — Z7952 Long term (current) use of systemic steroids: Secondary | ICD-10-CM

## 2022-10-23 DIAGNOSIS — M24522 Contracture, left elbow: Secondary | ICD-10-CM

## 2022-10-23 DIAGNOSIS — I10 Essential (primary) hypertension: Secondary | ICD-10-CM

## 2022-10-23 NOTE — Telephone Encounter (Addendum)
Pending OV note, please start Orencia SQ BIV  Dose: Orencia 125mg  SQ every 14 days (due to history of lymphopenia)  Knox Saliva, PharmD, MPH, BCPS, CPP Clinical Pharmacist (Rheumatology and Pulmonology)  ----- Message from Carole Binning, LPN sent at [id number] 10:55 AM EDT ----- Per Hazel Sams, PA-C, please apply for [place]. Thanks!

## 2022-10-23 NOTE — Telephone Encounter (Signed)
Submitted a Prior Authorization request to [facility] for [facility] via CoverMyMeds. Will update once we receive a response.  Key: Rhea Bleacher, PharmD, MPH, BCPS, CPP Clinical Pharmacist (Rheumatology and Pulmonology)

## 2022-10-23 NOTE — Patient Instructions (Signed)
Standing Labs We placed an order today for your standing lab work.   Please have your standing labs drawn in 1 month and every 3 months   Please have your labs drawn 2 weeks prior to your appointment so that the provider can discuss your lab results at your appointment, if possible.  Please note that you may see your imaging and lab results in [facility] before we have reviewed them. We will contact you once all results are reviewed. Please allow our office up to 72 hours to thoroughly review all of the results before contacting the office for clarification of your results.  WALK-IN LAB HOURS  Monday through Thursday from 8:00 am -12:30 pm and 1:00 pm-5:00 pm and Friday from 8:00 am-12:00 pm.  Patients with office visits requiring labs will be seen before walk-in labs.  You may encounter longer than normal wait times. Please allow additional time. Wait times may be shorter on  Monday and Thursday afternoons.  We do not book appointments for walk-in labs. We appreciate your patience and understanding with our staff.   Labs are drawn by [facility]. Please bring your co-pay at the time of your lab draw.  You may receive a bill from [facility] for your lab work.  Please note if you are on Hydroxychloroquine and and an order has been placed for a Hydroxychloroquine level,  you will need to have it drawn 4 hours or more after your last dose.  If you wish to have your labs drawn at another location, please call the office 24 hours in advance so we can fax the orders.  The office is located at [address], [place], [place], [place] [address]   If you have any questions regarding directions or hours of operation,  please call [phone].   As a reminder, please drink plenty of water prior to coming for your lab work. Thanks!   Abatacept Injection What is this medication? ABATACEPT (a ba TA sept) treats autoimmune conditions, such as arthritis. It may also be used to treat a condition that can  occur after a stem cell or bone marrow transplant (chronic graft versus host disease). It works by slowing down an overactive immune system. This medicine may be used for other purposes; ask your health care provider or pharmacist if you have questions. COMMON BRAND NAME(S): Minna Antis ClickJect What should I tell my care team before I take this medication? They need to know if you have any of these conditions: Cancer Diabetes Having surgery Hepatitis B or history of hepatitis B infection Immune system problems Infection, especially a viral infection, such as chickenpox, cold sores, herpes Lung or breathing problems, such as asthma or COPD Recent or upcoming vaccine Tuberculosis, a positive skin test for tuberculosis, or have recently been in close contact with someone who has tuberculosis An unusual or allergic reaction to abatacept, other medications, foods, dyes, or preservatives Pregnant or trying to get pregnant Breastfeeding How should I use this medication? This medication is infused into a vein or injected under the skin. Infusions are given by your care team in a hospital or clinic setting. It may be injected under the skin at home. If you get this medication at home, you will be taught how to prepare and give it. Use exactly as directed. Take it as directed on the prescription label. Keep taking it unless your care team tells you to stop. It is important that you put your used needles and syringes in a special sharps container.  Do not put them in a trash can. If you do not have a sharps container, call your pharmacist or care team to get one. Talk to your care team about the use of this medication in children. While it may be prescribed for children as young as 2 years for selected conditions, precautions do apply. Overdosage: If you think you have taken too much of this medicine contact a poison control center or emergency room at once. NOTE: This medicine is only for you. Do  not share this medicine with others. What if I miss a dose? If you miss a dose, take it as soon as you can. If it is almost time for your next dose, take only that dose. Do not take double or extra doses. What may interact with this medication? Do not take this medication with any of the following: Live virus vaccines This medication may also interact with the following: Anakinra Baricitinib Canakinumab Medications that lower your chance of fighting an infection Rituximab TNF blockers, such as adalimumab, certolizumab, etanercept, golimumab, infliximab Tocilizumab Tofacitinib Upadacitinib Ustekinumab This list may not describe all possible interactions. Give your health care provider a list of all the medicines, herbs, non-prescription drugs, or dietary supplements you use. Also tell them if you smoke, drink alcohol, or use illegal drugs. Some items may interact with your medicine. What should I watch for while using this medication? Visit your care team for regular checks on your progress. Tell your care team if your symptoms do not start to get better or if they get worse. You will be tested for tuberculosis (TB) before you start this medication. If your care team prescribed any medication for TB, you should start taking the TB medication before starting this medication. Make sure to finish the full course of TB medication. This medication may increase your risk of getting an infection. Call your care team if you get fever, chills, or sore throat, or other symptoms of a cold or flu. Do not treat yourself. Try to avoid being around people who are sick. If you have diabetes and are getting this medication as an infusion, it may give false high blood sugar readings on the day of your dose. This may happen if you use certain types of blood glucose tests. Your care team may tell you to use a different way to monitor your blood sugar levels. What side effects may I notice from receiving this  medication? Side effects that you should report to your care team as soon as possible: Allergic reactions--skin rash, itching, hives, swelling of the face, lips, tongue, or throat Infection--fever, chills, cough, sore throat, wounds that don't heal, pain or trouble when passing urine, general feeling of discomfort or being unwell Side effects that usually do not require medical attention (report to your care team if they continue or are bothersome): Back pain Cough Dizziness Headache Runny or stuffy nose Sore throat This list may not describe all possible side effects. Call your doctor for medical advice about side effects. You may report side effects to FDA at 1-800-FDA-1088. Where should I keep my medication? If you take this medication at home, keep out of the reach of children and pets. Store in the refrigerator. Keep this medication in the original container. Protect from light. Do not freeze. Do not shake. Get rid of any unused medication after the expiration date. To get rid of medications that are no longer needed or have expired: Take the medication to a medication take-back program. Check with  your pharmacy or law enforcement to find a location. If you cannot return the medication, ask your pharmacist or care team how to get rid of the medication safely. NOTE: This sheet is a summary. It may not cover all possible information. If you have questions about this medicine, talk to your doctor, pharmacist, or health care provider.  2023 Elsevier/Gold Standard (2021-12-23 00:00:00)

## 2022-10-23 NOTE — Telephone Encounter (Signed)
Received notification from [facility] regarding a prior authorization for ORENCIA SQ. Authorization has been APPROVED from 10/23/22 to 08/10/23. Approval letter sent to scan center.  Per test claim, copay for 28 days supply is $11.20  Patient can fill through [facility]: [phone]   Authorization # [id number] Phone # [phone]  Will need to taper prednisone by 1mg  every month after starting Orencia.  He can be scheduled for Orencia new start in clinic  Knox Saliva, PharmD, MPH, BCPS, CPP Clinical Pharmacist (Rheumatology and Pulmonology)

## 2022-10-24 LAB — CBC WITH DIFFERENTIAL/PLATELET
Absolute Monocytes: 492 cells/uL (ref 200–950)
Basophils Absolute: 30 cells/uL (ref 0–200)
Basophils Relative: 0.5 %
Eosinophils Absolute: 102 cells/uL (ref 15–500)
Eosinophils Relative: 1.7 %
HCT: 33.2 % — ABNORMAL LOW (ref 38.5–50.0)
Hemoglobin: 10.7 g/dL — ABNORMAL LOW (ref 13.2–17.1)
Lymphs Abs: 606 cells/uL — ABNORMAL LOW (ref 850–3900)
MCH: 26.7 pg — ABNORMAL LOW (ref 27.0–33.0)
MCHC: 32.2 g/dL (ref 32.0–36.0)
MCV: 82.8 fL (ref 80.0–100.0)
MPV: 9.8 fL (ref 7.5–12.5)
Monocytes Relative: 8.2 %
Neutro Abs: 4770 cells/uL (ref 1500–7800)
Neutrophils Relative %: 79.5 %
Platelets: 215 10*3/uL (ref 140–400)
RBC: 4.01 10*6/uL — ABNORMAL LOW (ref 4.20–5.80)
RDW: 14.5 % (ref 11.0–15.0)
Total Lymphocyte: 10.1 %
WBC: 6 10*3/uL (ref 3.8–10.8)

## 2022-10-24 LAB — COMPLETE METABOLIC PANEL WITH GFR
AG Ratio: 1 (calc) (ref 1.0–2.5)
ALT: 10 U/L (ref 9–46)
AST: 17 U/L (ref 10–35)
Albumin: 3.6 g/dL (ref 3.6–5.1)
Alkaline phosphatase (APISO): 86 U/L (ref 35–144)
BUN/Creatinine Ratio: 19 (calc) (ref 6–22)
BUN: 34 mg/dL — ABNORMAL HIGH (ref 7–25)
CO2: 20 mmol/L (ref 20–32)
Calcium: 10 mg/dL (ref 8.6–10.3)
Chloride: 108 mmol/L (ref 98–110)
Creat: 1.81 mg/dL — ABNORMAL HIGH (ref 0.70–1.35)
Globulin: 3.6 g/dL (calc) (ref 1.9–3.7)
Glucose, Bld: 81 mg/dL (ref 65–99)
Potassium: 3.9 mmol/L (ref 3.5–5.3)
Sodium: 139 mmol/L (ref 135–146)
Total Bilirubin: 0.3 mg/dL (ref 0.2–1.2)
Total Protein: 7.2 g/dL (ref 6.1–8.1)
eGFR: 40 mL/min/{1.73_m2} — ABNORMAL LOW (ref >=60)

## 2022-10-26 ENCOUNTER — Telehealth: Payer: Self-pay | Admitting: Family Medicine

## 2022-10-26 NOTE — Progress Notes (Signed)
Creatinine is elevated-1.81 and GFR is low 40.  Avoid the use of NSAIDs.  Rest of CMP WNL.  Please forward results to Dr. Erlene Quan as requested.  Anemic-likely secondary to CKD.  absolute lymphocytes are low. We will continue to monitor closely.

## 2022-10-26 NOTE — Telephone Encounter (Signed)
Please let [name] know that his chest CT did shows that things are stable in the lungs.  It did show a thyroid nodule which sill need a follow up thyroid ultrasound.  He needs to contact his PCP to make sure this is  Patient's wife notified.

## 2022-10-27 NOTE — Telephone Encounter (Signed)
Patient for Orencia new start on 10/28/2022. Will use sample  Knox Saliva, PharmD, MPH, BCPS, CPP Clinical Pharmacist (Rheumatology and Pulmonology)

## 2022-10-28 ENCOUNTER — Other Ambulatory Visit (HOSPITAL_COMMUNITY): Payer: Self-pay

## 2022-10-28 ENCOUNTER — Ambulatory Visit: Payer: Medicare PPO | Attending: Rheumatology | Admitting: Pharmacist

## 2022-10-28 ENCOUNTER — Other Ambulatory Visit: Payer: Self-pay

## 2022-10-28 DIAGNOSIS — M0579 Rheumatoid arthritis with rheumatoid factor of multiple sites without organ or systems involvement: Secondary | ICD-10-CM

## 2022-10-28 DIAGNOSIS — Z79899 Other long term (current) drug therapy: Secondary | ICD-10-CM

## 2022-10-28 DIAGNOSIS — Z7189 Other specified counseling: Secondary | ICD-10-CM

## 2022-10-28 MED ORDER — ORENCIA CLICKJECT 125 MG/ML ~~LOC~~ SOAJ
125.0000 mg | SUBCUTANEOUS | 0 refills | Status: DC
  Filled 2022-10-28: qty 6, fill #0
  Filled 2022-11-11 – 2022-11-12 (×2): qty 2, 28d supply, fill #0
  Filled 2022-11-13: qty 4, 30d supply, fill #0

## 2022-10-28 NOTE — Progress Notes (Signed)
Pharmacy Note  Subjective:   Patient presents to clinic today to receive first dose of Orencia for RA. Patient currently takes prednisone 10 mg daily.  Patient running a fever or have signs/symptoms of infection? No  Patient currently on antibiotics for the treatment of infection? No  Patient have any upcoming invasive procedures/surgeries? No  Objective: CMP     Component Value Date/Time   NA 139 10/23/2022 1031   NA 134 04/22/2022 1316   K 3.9 10/23/2022 1031   CL 108 10/23/2022 1031   CO2 20 10/23/2022 1031   GLUCOSE 81 10/23/2022 1031   BUN 34 (H) 10/23/2022 1031   BUN 18 04/22/2022 1316   CREATININE 1.81 (H) 10/23/2022 1031   CALCIUM 10.0 10/23/2022 1031   PROT 7.2 10/23/2022 1031   ALBUMIN 3.3 (L) 04/10/2022 1254   AST 17 10/23/2022 1031   ALT 10 10/23/2022 1031   ALKPHOS 94 04/10/2022 1254   BILITOT 0.3 10/23/2022 1031   GFRNONAA 52 (L) 07/14/2022 0626   GFRNONAA 74 02/03/2021 1106   GFRAA 86 02/03/2021 1106    CBC    Component Value Date/Time   WBC 6.0 10/23/2022 1031   RBC 4.01 (L) 10/23/2022 1031   HGB 10.7 (L) 10/23/2022 1031   HCT 33.2 (L) 10/23/2022 1031   PLT 215 10/23/2022 1031   MCV 82.8 10/23/2022 1031   MCH 26.7 (L) 10/23/2022 1031   MCHC 32.2 10/23/2022 1031   RDW 14.5 10/23/2022 1031   LYMPHSABS 606 (L) 10/23/2022 1031   MONOABS 0.4 10/07/2021 1020   EOSABS 102 10/23/2022 1031   BASOSABS 30 10/23/2022 1031    Baseline Immunosuppressant Therapy Labs TB GOLD    Latest Ref Rng & Units 03/06/2022    1:42 PM  Quantiferon TB Gold  Quantiferon TB Gold Plus NEGATIVE INDETERMINATE    Hepatitis Panel    Latest Ref Rng & Units 10/07/2021   10:20 AM  Hepatitis  Hep B Surface Ag NON REACTIVE NON REACTIVE   Hep B IgM NON REACTIVE NON REACTIVE   Hep C Ab NON REACTIVE NON REACTIVE   Hep A IgM NON REACTIVE NON REACTIVE    HIV Lab Results  Component Value Date   HIV Non Reactive 10/07/2021   Immunoglobulins    Latest Ref Rng & Units  04/12/2022    4:16 AM  Immunoglobulin Electrophoresis  IgG 603 - 1,613 mg/dL 977   IgM 20 - 172 mg/dL 181    SPEP    Latest Ref Rng & Units 10/23/2022   10:31 AM  Serum Protein Electrophoresis  Total Protein 6.1 - 8.1 g/dL 7.2    Chest x-ray: 05/28/22 - no acute cardiopulmonary process  Assessment/Plan:  Reviewed importance of holding ORENCIA with signs/symptoms of an infections, if antibiotics are prescribed to treat an active infection, and with invasive procedures  Demonstrated proper injection technique with ORENCIA demo device  Patient able to demonstrate proper injection technique using the teach back method.  Patient self injected in the left upper thigh with:  Sample Medication: Orencia Clickject 125mg /mL  NDC: [id number] Lot: [id number] Expiration: 09/2023  Patient tolerated well.  Observed for 30 mins in office for adverse reaction and none noted. Pt denies itchiness and irritation   Patient is to return in 1 month for labs and 6-8 weeks for follow-up appointment.  Standing orders for CBC/CMP placed.  TB gold will be monitored yearly.   ORENCIA approved through insurance .   Rx sent to: [facility]:  [phone] .  Patient provided with pharmacy phone number and advised to call later this week to schedule shipment to home.  Patient will continue Orencia 125mg  subcut every 14 days in combination with prednisone 10mg  daily. Will plan to taper prednisone after f/u appt.  All questions encouraged and answered.  Instructed patient to call with any further questions or concerns.  Knox Saliva, PharmD, MPH, BCPS, CPP Clinical Pharmacist (Rheumatology and Pulmonology)  10/28/2022 9:59 AM

## 2022-10-28 NOTE — Patient Instructions (Addendum)
Your next ORENCIA dose is due on 11/11/22, 11/25/22, and every 14 days thereafter  CONTINUE PREDNISONE 10mg  daily until your follow-up appointment  HOLD ORENCIA if you have signs or symptoms of an infection. You can resume once you feel better or back to your baseline. HOLD ORENCIA if you start antibiotics to treat an infection. HOLD ORENCIA around the time of surgery/procedures. Your surgeon will be able to provide recommendations on when to hold BEFORE and when you are cleared to [place].  Pharmacy information: Your prescription will be shipped from [facility]. Their phone number is [phone] [facility] will call you to schedule the first shipment to your home.  After that the pharmacy will reach out to schedule refills  Labs are due in 1 month then every 3 months. Lab hours are from Monday to Thursday 8am-12:30pm and 1pm-5pm and Friday 8am-12pm. You do not need an appointment if you come for labs during these times.  How to manage an injection site reaction: Remember the 5 C's: COUNTER - leave on the counter at least 30 minutes but up to overnight to bring medication to room temperature. This may help prevent stinging COLD - place something cold (like an ice gel pack or cold water bottle) on the injection site just before cleansing with alcohol. This may help reduce pain CLARITIN - use Claritin (generic name is loratadine) for the first two weeks of treatment or the day of, the day before, and the day after injecting. This will help to minimize injection site reactions CORTISONE CREAM - apply if injection site is irritated and itching CALL ME - if injection site reaction is bigger than the size of your fist, looks infected, blisters, or if you develop hives

## 2022-11-06 ENCOUNTER — Other Ambulatory Visit (HOSPITAL_COMMUNITY): Payer: Self-pay

## 2022-11-09 ENCOUNTER — Telehealth: Payer: Self-pay

## 2022-11-09 NOTE — Telephone Encounter (Signed)
Patients wife LM asking about a Thyroid CT she statess that the [facility] with [facility] called her about this and said if you didn't want to order it she would get him set up with a general surgeon

## 2022-11-09 NOTE — Telephone Encounter (Signed)
Patient wife informed

## 2022-11-10 ENCOUNTER — Other Ambulatory Visit: Payer: Self-pay | Admitting: Urology

## 2022-11-10 DIAGNOSIS — E042 Nontoxic multinodular goiter: Secondary | ICD-10-CM

## 2022-11-10 NOTE — Progress Notes (Unsigned)
I have placed a referral to [facility] endocrinology for his thyroid nodules.

## 2022-11-11 ENCOUNTER — Other Ambulatory Visit (HOSPITAL_COMMUNITY): Payer: Self-pay

## 2022-11-11 ENCOUNTER — Other Ambulatory Visit: Payer: Self-pay

## 2022-11-12 ENCOUNTER — Other Ambulatory Visit (HOSPITAL_COMMUNITY): Payer: Self-pay

## 2022-11-12 ENCOUNTER — Other Ambulatory Visit: Payer: Self-pay

## 2022-11-12 NOTE — Telephone Encounter (Signed)
Received a fax regarding Prior Authorization from [facility] for [facility]. Authorization has been DENIED because plan will only cover max 30 day supply. Per Medicare guidelines  Knox Saliva, PharmD, MPH, BCPS, CPP Clinical Pharmacist (Rheumatology and Pulmonology)

## 2022-11-12 NOTE — Telephone Encounter (Signed)
Received message from [facility]:  Trish just informed me of this pts Orencia, their order is for 2 pens, but the box comes in a set of 4 and they dont want to open a box, can a PA be done for a full box for a 56ds?  Submitted a Prior Authorization request to [facility] for [facility] via CoverMyMeds. Will update once we receive a response.  [employer] - PA Case ID: [id number]  Patient was due for next dose 11/11/22.

## 2022-11-13 ENCOUNTER — Other Ambulatory Visit (HOSPITAL_COMMUNITY): Payer: Self-pay

## 2022-11-13 ENCOUNTER — Other Ambulatory Visit: Payer: Self-pay

## 2022-11-13 NOTE — Telephone Encounter (Signed)
Per Tawanna Cooler at [facility], they will plan to break Orencia box for pt and ship out two pens  Chesley Mires, PharmD, MPH, BCPS, CPP Clinical Pharmacist (Rheumatology and Pulmonology)

## 2022-11-16 ENCOUNTER — Telehealth: Payer: Self-pay

## 2022-11-16 ENCOUNTER — Other Ambulatory Visit (HOSPITAL_COMMUNITY): Payer: Self-pay

## 2022-11-16 NOTE — Telephone Encounter (Signed)
Patient contacted the office stating he has not gotten his Orencia. Advised patient his Dub Amis was sent to the [facility]. Advised patient to contact the pharmacy at [phone]. Patient states he will call the pharmacy.

## 2022-11-25 ENCOUNTER — Telehealth: Payer: Self-pay | Admitting: *Deleted

## 2022-11-25 NOTE — Telephone Encounter (Addendum)
Patient contacted the office and states he has received a letter in the mail from [facility] stating that they are no longer going to cover the Orencia he is taking. Patient would like to know what his other options are going to be or what he should do. Patient states he is currently taking the Orencia every 2 weeks and has 3 pens left. Patient states his next injection is due December 01, 2022. Please reach out to patient to advise.

## 2022-11-25 NOTE — Telephone Encounter (Signed)
Patient likely received denial that we had submitted for qty exception for Orencia. Patient has been advised that his insurance has approved Orencia through the end of the year. If any issues, pharmacy will let us know  Chesley Mires, PharmD, MPH, BCPS, CPP Clinical Pharmacist (Rheumatology and Pulmonology)

## 2022-11-26 NOTE — Progress Notes (Deleted)
Office Visit Note  Patient: Michael Hinton.             Date of Birth: [birth year]           MRN: [id number]             PCP: Orson Eva, NP Referring: Orson Eva, NP Visit Date: 12/10/2022 Occupation: @GUAROCC @  Subjective:  No chief complaint on file.   History of Present Illness: Michael Hinton. is a 72 y.o. male ***   Previous therapy includes Enbrel, Humira, and Kevzara.  He previously had to space dosing due to history of lymphopenia. Carlis Abbott was discontinued in August 2023 at which time he had been spacing he had been spacing dosing to every 18 days.   Incidental finding while hospitalized 9/1 to 04/14/2022.  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 Patient was admitted on 07/13/2022 for scheduled hand-assisted laparoscopic left radical nephrectomy with Dr. Apolinar Junes for management.   Reviewed Dr. Delana Meyer note from 08/26/2022: Left renal cell carcinoma status post uncomplicated radical nephrectomy on 07/13/2022.  Plan to update CT abdomen and pelvis along with chest x-ray in 1 year for surveillance.  No further therapy is indicated.  Activities of Daily Living:  Patient reports morning stiffness for *** {minute/hour:19697}.   Patient {ACTIONS;DENIES/REPORTS:21021675::"Denies"} nocturnal pain.  Difficulty dressing/grooming: {ACTIONS;DENIES/REPORTS:21021675::"Denies"} Difficulty climbing stairs: {ACTIONS;DENIES/REPORTS:21021675::"Denies"} Difficulty getting out of chair: {ACTIONS;DENIES/REPORTS:21021675::"Denies"} Difficulty using hands for taps, buttons, cutlery, and/or writing: {ACTIONS;DENIES/REPORTS:21021675::"Denies"}  No Rheumatology ROS completed.   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   Rheumatoid arthritis involving multiple sites with positive rheumatoid factor 06/02/2016   High risk medication use 06/02/2016   Hypertension 06/02/2016   Vitamin D deficiency 06/02/2016    Past Medical History:  Diagnosis Date   Anemia    Aortic atherosclerosis (HCC)    Atrial fibrillation (HCC)    a.) CHA2DS2VASc = 5 (age, HTN, CVA x 2, vascular disease history);  b.) rate/rhythm maintained without pharmacological intervention; chronically anticoagulated with clopidogrel   Bilateral renal cysts 05/29/2022   Carotid artery disease (HCC) 04/11/2022   a.) doppler 04/11/2022: <50 LICA, no sig RICA.   CVA (cerebral vascular accident) [facility])    a.) MRI brain 01/14/2022: numerous chronic cerebellar infarcts; b.) CT head 04/10/2022: small old lacunar infarcts are seen in cerebellum bilaterally   Diastolic dysfunction 04/11/2022   a.) TTE 04/11/2022: EF 55-60%, mild BAE, mild MR, G1DD.   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   a.) renal US 04/10/2022: solid heterogenous LEFT renal mass measuring 5.2 cm; b.) MRI ABD - 5.3 x 4.3 cm renal  mass to posterior lip of LEFT kidney extending into renal sinus; c.)  renal Bx  05/06/2022 - (+) for RCC with clear cell features   Rheumatoid arthritis involving multiple sites with positive rheumatoid factor (HCC) 06/02/2016   T2DM (type 2 diabetes mellitus) (HCC)    Thoracic ascending aortic  aneurysm (HCC) 06/01/2022   a.) CT chest - measured 4.2 cm    Family History  Problem Relation Age of Onset   Heart disease Mother    Diabetes Mother    Breast cancer Mother    Kidney disease Father    Diabetes Father    Diabetes Daughter    Past Surgical History:  Procedure Laterality Date   LAPAROSCOPIC NEPHRECTOMY, HAND ASSISTED Left 07/13/2022   Procedure: HAND ASSISTED LAPAROSCOPIC RADICAL NEPHRECTOMY;  Surgeon: Vanna Scotland, MD;  Location: [facility] ORS;  Service: Urology;  Laterality: Left;   RENAL BIOPSY  05/2022   Social History   Social History Narrative   Not on file   Immunization History  Administered Date(s) Administered   Moderna Sars-Covid-2 Vaccination 10/12/2019, 11/15/2019   PPD Test 03/20/2022     Objective: Vital Signs: There were no vitals taken for this visit.   Physical Exam   Musculoskeletal Exam: ***  CDAI Exam: CDAI Score: -- Patient Global: --; Provider Global: -- Swollen: --; Tender: -- Joint Exam 12/10/2022   No joint exam has been documented for this visit   There is currently no information documented on the homunculus. Go to the Rheumatology activity and complete the homunculus joint exam.  Investigation: No additional findings.  Imaging: No results found.  Recent Labs: Lab Results  Component Value Date   WBC 6.0 10/23/2022   HGB 10.7 (L) 10/23/2022   PLT 215 10/23/2022   NA 139 10/23/2022   K 3.9 10/23/2022   CL 108 10/23/2022   CO2 20 10/23/2022   GLUCOSE 81 10/23/2022   BUN 34 (H) 10/23/2022   CREATININE 1.81 (H) 10/23/2022   BILITOT 0.3 10/23/2022   ALKPHOS 94 04/10/2022   AST 17 10/23/2022   ALT 10 10/23/2022   PROT 7.2 10/23/2022   ALBUMIN 3.3 (L) 04/10/2022   CALCIUM 10.0 10/23/2022   GFRAA 86 02/03/2021   QFTBGOLD NEGATIVE 06/16/2017   QFTBGOLDPLUS INDETERMINATE (A) 03/06/2022    Speciality Comments: TB Skin Test 03/23/2022: Negative  Procedures:  No procedures performed Allergies: Patient has no  known allergies.   Assessment / Plan:     Visit Diagnoses: Rheumatoid arthritis involving multiple sites with positive rheumatoid factor  High risk medication use  Other drug-induced neutropenia  Left renal mass  ANA positive  Contracture of left elbow  Contracture of right elbow  Contractures of both knees  Vitamin D deficiency  Primary hypertension  Spinal stenosis of lumbar region without neurogenic claudication  DDD (degenerative disc disease), lumbar  Long term systemic steroid user  Renal cell adenoma, left  Weight loss, unintentional  Orders: No orders of the defined types were placed in this encounter.  No orders of the defined types were placed in this encounter.   Face-to-face time spent with patient was *** minutes. Greater than 50% of time was spent in counseling and coordination of care.  Follow-Up Instructions: No follow-ups on file.   Gearldine Bienenstock, PA-C  Note - This record has been created using Dragon software.  Chart creation errors have been sought, but may not always  have been located. Such creation errors do not reflect on  the standard of medical care.

## 2022-12-08 ENCOUNTER — Other Ambulatory Visit (HOSPITAL_COMMUNITY): Payer: Self-pay

## 2022-12-09 ENCOUNTER — Other Ambulatory Visit: Payer: Self-pay

## 2022-12-09 ENCOUNTER — Other Ambulatory Visit (HOSPITAL_COMMUNITY): Payer: Self-pay

## 2022-12-10 ENCOUNTER — Ambulatory Visit: Payer: Medicare PPO | Admitting: Physician Assistant

## 2022-12-10 DIAGNOSIS — D702 Other drug-induced agranulocytosis: Secondary | ICD-10-CM

## 2022-12-10 DIAGNOSIS — D3002 Benign neoplasm of left kidney: Secondary | ICD-10-CM

## 2022-12-10 DIAGNOSIS — M24561 Contracture, right knee: Secondary | ICD-10-CM

## 2022-12-10 DIAGNOSIS — M24521 Contracture, right elbow: Secondary | ICD-10-CM

## 2022-12-10 DIAGNOSIS — M24522 Contracture, left elbow: Secondary | ICD-10-CM

## 2022-12-10 DIAGNOSIS — R634 Abnormal weight loss: Secondary | ICD-10-CM

## 2022-12-10 DIAGNOSIS — M5136 Other intervertebral disc degeneration, lumbar region: Secondary | ICD-10-CM

## 2022-12-10 DIAGNOSIS — R768 Other specified abnormal immunological findings in serum: Secondary | ICD-10-CM

## 2022-12-10 DIAGNOSIS — Z79899 Other long term (current) drug therapy: Secondary | ICD-10-CM

## 2022-12-10 DIAGNOSIS — I10 Essential (primary) hypertension: Secondary | ICD-10-CM

## 2022-12-10 DIAGNOSIS — E559 Vitamin D deficiency, unspecified: Secondary | ICD-10-CM

## 2022-12-10 DIAGNOSIS — Z7952 Long term (current) use of systemic steroids: Secondary | ICD-10-CM

## 2022-12-10 DIAGNOSIS — M0579 Rheumatoid arthritis with rheumatoid factor of multiple sites without organ or systems involvement: Secondary | ICD-10-CM

## 2022-12-10 DIAGNOSIS — N2889 Other specified disorders of kidney and ureter: Secondary | ICD-10-CM

## 2022-12-10 DIAGNOSIS — M48061 Spinal stenosis, lumbar region without neurogenic claudication: Secondary | ICD-10-CM

## 2022-12-11 ENCOUNTER — Other Ambulatory Visit: Payer: Self-pay | Admitting: Physician Assistant

## 2022-12-11 NOTE — Telephone Encounter (Signed)
Please schedule patient a follow up visit. Patient due May 2024. Thanks!  

## 2022-12-11 NOTE — Telephone Encounter (Signed)
LMOM for patient to call and schedule follow-up appointment.   °

## 2022-12-11 NOTE — Telephone Encounter (Signed)
Last Fill: 08/31/2022  Next Visit: Due May 2024. Message sent to the front to schedule.   Last Visit: 10/23/2022  Dx:  Rheumatoid arthritis involving multiple sites with positive rheumatoid factor   Current Dose per office note on 10/23/2022: prednisone 10 mg daily   Okay to refill Prednisone?

## 2022-12-11 NOTE — Telephone Encounter (Signed)
Patient was supposed to taper prednisone by 1 mg every month according to the office visit note from March 2024.  Patient reduce the dose of prednisone to 1-1/2 tablet p.o. every morning.

## 2022-12-15 ENCOUNTER — Ambulatory Visit: Payer: Medicare PPO | Admitting: Nurse Practitioner

## 2022-12-16 ENCOUNTER — Ambulatory Visit (INDEPENDENT_AMBULATORY_CARE_PROVIDER_SITE_OTHER): Payer: Medicare PPO | Admitting: Nurse Practitioner

## 2022-12-16 ENCOUNTER — Encounter: Payer: Self-pay | Admitting: Nurse Practitioner

## 2022-12-16 VITALS — BP 154/88 | HR 64 | Ht 71.0 in | Wt 201.0 lb

## 2022-12-16 DIAGNOSIS — C642 Malignant neoplasm of left kidney, except renal pelvis: Secondary | ICD-10-CM | POA: Diagnosis not present

## 2022-12-16 DIAGNOSIS — I10 Essential (primary) hypertension: Secondary | ICD-10-CM | POA: Diagnosis not present

## 2022-12-16 DIAGNOSIS — M0579 Rheumatoid arthritis with rheumatoid factor of multiple sites without organ or systems involvement: Secondary | ICD-10-CM

## 2022-12-16 DIAGNOSIS — M5136 Other intervertebral disc degeneration, lumbar region: Secondary | ICD-10-CM | POA: Diagnosis not present

## 2022-12-16 DIAGNOSIS — M51369 Other intervertebral disc degeneration, lumbar region without mention of lumbar back pain or lower extremity pain: Secondary | ICD-10-CM

## 2022-12-16 MED ORDER — LOSARTAN POTASSIUM 25 MG PO TABS: 25.0000 mg | ORAL_TABLET | Freq: Every day | ORAL | 0 refills | Status: DC

## 2022-12-16 NOTE — Patient Instructions (Signed)
1) No added salt to foods 2) No ham 3) Cont amlodipine 10 mg daily and adding on losartan 25 mg daily 4) Follow up appt in 1 week

## 2022-12-16 NOTE — Progress Notes (Signed)
Established Patient Office Visit  Subjective:  Patient ID: Michael Gammage., male    DOB: 05-16-1951  Age: 72 y.o. MRN: [id number]  Chief Complaint  Patient presents with   Acute Visit    Hypertension    Acute visit, elevated BP.  Checking it at home, 150's over 90's.  Usually runs a low BP.  Currently taking amlodpine 10 mg daily.    No other concerns at this time.   Past Medical History:  Diagnosis Date   Anemia    Aortic atherosclerosis (HCC)    Atrial fibrillation (HCC)    a.) CHA2DS2VASc = 5 (age, HTN, CVA x 2, vascular disease history);  b.) rate/rhythm maintained without pharmacological intervention; chronically anticoagulated with clopidogrel   Bilateral renal cysts 05/29/2022   Carotid artery disease (HCC) 04/11/2022   a.) doppler 04/11/2022: <50 LICA, no sig RICA.   CVA (cerebral vascular accident) [facility])    a.) MRI brain 01/14/2022: numerous chronic cerebellar infarcts; b.) CT head 04/10/2022: small old lacunar infarcts are seen in cerebellum bilaterally   Diastolic dysfunction 04/11/2022   a.) TTE 04/11/2022: EF 55-60%, mild BAE, mild MR, G1DD.   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   T2DM (type 2 diabetes mellitus) (HCC)    Thoracic ascending aortic aneurysm (HCC) 06/01/2022   a.) CT chest - measured 4.2 cm    Past Surgical History:  Procedure Laterality Date   LAPAROSCOPIC NEPHRECTOMY, HAND  ASSISTED Left 07/13/2022   Procedure: HAND ASSISTED LAPAROSCOPIC RADICAL NEPHRECTOMY;  Surgeon: Vanna Scotland, MD;  Location: [facility] ORS;  Service: Urology;  Laterality: Left;   RENAL BIOPSY  05/2022    Social History   Socioeconomic History   Marital status: Married    Spouse name: Felecia   Number of children: Not on file   Years of education: Not on file   Highest education level: Not on file  Occupational History   Not on file  Tobacco Use   Smoking status: Former    Packs/day: 0.50    Years: 7.00    Additional pack years: 0.00    Total pack years: 3.50    Types: Cigarettes    Quit date: 03/15/1978    Years since quitting: 44.7    Passive exposure: Past   Smokeless tobacco: Never  Vaping Use   Vaping Use: Never used  Substance and Sexual Activity   Alcohol use: Not Currently   Drug use: No   Sexual activity: Yes  Other Topics Concern   Not on file  Social History Narrative   Not on file   Social Determinants of Health   Financial Resource Strain: Not on file  Food Insecurity: Not on file  Transportation Needs: Not on file  Physical Activity: Not on file  Stress: Not on file  Social Connections: Not on file  Intimate Partner Violence: Not on file  Family History  Problem Relation Age of Onset   Heart disease Mother    Diabetes Mother    Breast cancer Mother    Kidney disease Father    Diabetes Father    Diabetes Daughter     No Known Allergies  Review of Systems  Constitutional:  Positive for malaise/fatigue.  HENT:  Positive for hearing loss.   Eyes: Negative.   Respiratory: Negative.    Cardiovascular: Negative.   Gastrointestinal: Negative.   Genitourinary: Negative.   Musculoskeletal:  Positive for joint pain and myalgias.  Skin: Negative.   Neurological: Negative.   Endo/Heme/Allergies: Negative.   Psychiatric/Behavioral: Negative.         Objective:   BP (!) 154/88   Pulse 64   Ht 5\' 11"  (1.803 m)   Wt 201 lb (91.2 kg)   SpO2  100%   BMI 28.03 kg/m   Vitals:   12/16/22 0949  BP: (!) 154/88  Pulse: 64  Height: 5\' 11"  (1.803 m)  Weight: 201 lb (91.2 kg)  SpO2: 100%  BMI (Calculated): 28.05    Physical Exam Vitals reviewed.  Constitutional:      Appearance: Normal appearance.  HENT:     Head: Normocephalic.     Nose: Nose normal.     Mouth/Throat:     Mouth: Mucous membranes are moist.  Eyes:     Pupils: Pupils are equal, round, and reactive to light.  Cardiovascular:     Rate and Rhythm: Normal rate and regular rhythm.  Pulmonary:     Effort: Pulmonary effort is normal.     Breath sounds: Normal breath sounds.  Abdominal:     General: Bowel sounds are normal.     Palpations: Abdomen is soft.  Musculoskeletal:        General: Swelling and tenderness present.     Cervical back: Normal range of motion and neck supple.  Skin:    General: Skin is warm and dry.  Neurological:     Mental Status: He is alert and oriented to person, place, and time.  Psychiatric:        Mood and Affect: Mood normal.        Behavior: Behavior normal.      No results found for any visits on 12/16/22.  Recent Results (from the past 2160 hour(s))  CBC with Differential/Platelet     Status: Abnormal   Collection Time: 10/23/22 10:31 AM  Result Value Ref Range   WBC 6.0 3.8 - 10.8 Thousand/uL   RBC 4.01 (L) 4.20 - 5.80 Million/uL   Hemoglobin 10.7 (L) 13.2 - 17.1 g/dL   HCT 16.1 (L) 09.6 - 04.5 %   MCV 82.8 80.0 - 100.0 fL   MCH 26.7 (L) 27.0 - 33.0 pg   MCHC 32.2 32.0 - 36.0 g/dL   RDW 40.9 81.1 - 91.4 %   Platelets 215 140 - 400 Thousand/uL   MPV 9.8 7.5 - 12.5 fL   Neutro Abs 4,770 1,500 - 7,800 cells/uL   Lymphs Abs 606 (L) 850 - 3,900 cells/uL   Absolute Monocytes 492 200 - 950 cells/uL   Eosinophils Absolute 102 15 - 500 cells/uL   Basophils Absolute 30 0 - 200 cells/uL   Neutrophils Relative % 79.5 %   Total Lymphocyte 10.1 %   Monocytes Relative 8.2 %   Eosinophils Relative 1.7 %   Basophils  Relative 0.5 %   Smear Review      Comment: Review of peripheral smear confirms automated  results.   COMPLETE METABOLIC PANEL WITH GFR     Status: Abnormal   Collection Time: 10/23/22 10:31 AM  Result Value Ref Range   Glucose, Bld 81 65 - 99 mg/dL    Comment: .            Fasting reference interval .    BUN 34 (H) 7 - 25 mg/dL   Creat 1.61 (H) 0.96 - 1.35 mg/dL   eGFR 40 (L) > OR = 60 mL/min/1.34m2   BUN/Creatinine Ratio 19 6 - 22 (calc)   Sodium 139 135 - 146 mmol/L   Potassium 3.9 3.5 - 5.3 mmol/L   Chloride 108 98 - 110 mmol/L   CO2 20 20 - 32 mmol/L   Calcium 10.0 8.6 - 10.3 mg/dL   Total Protein 7.2 6.1 - 8.1 g/dL   Albumin 3.6 3.6 - 5.1 g/dL   Globulin 3.6 1.9 - 3.7 g/dL (calc)   AG Ratio 1.0 1.0 - 2.5 (calc)   Total Bilirubin 0.3 0.2 - 1.2 mg/dL   Alkaline phosphatase (APISO) 86 35 - 144 U/L   AST 17 10 - 35 U/L   ALT 10 9 - 46 U/L      Assessment & Plan:   Problem List Items Addressed This Visit   None   No follow-ups on file.   Total time spent: 35 minutes  Orson Eva, NP  12/16/2022

## 2022-12-24 ENCOUNTER — Encounter: Payer: Self-pay | Admitting: Nurse Practitioner

## 2022-12-24 ENCOUNTER — Ambulatory Visit (INDEPENDENT_AMBULATORY_CARE_PROVIDER_SITE_OTHER): Payer: Medicare PPO | Admitting: Nurse Practitioner

## 2022-12-24 VITALS — BP 136/78 | HR 70 | Ht 71.0 in | Wt 200.6 lb

## 2022-12-24 DIAGNOSIS — D649 Anemia, unspecified: Secondary | ICD-10-CM

## 2022-12-24 DIAGNOSIS — D3002 Benign neoplasm of left kidney: Secondary | ICD-10-CM | POA: Diagnosis not present

## 2022-12-24 DIAGNOSIS — M0579 Rheumatoid arthritis with rheumatoid factor of multiple sites without organ or systems involvement: Secondary | ICD-10-CM

## 2022-12-24 DIAGNOSIS — I10 Essential (primary) hypertension: Secondary | ICD-10-CM

## 2022-12-24 MED ORDER — LOSARTAN POTASSIUM 50 MG PO TABS: 50.0000 mg | ORAL_TABLET | Freq: Every day | ORAL | 1 refills | Status: DC

## 2022-12-24 NOTE — Progress Notes (Signed)
Established Patient Office Visit  Subjective:  Patient ID: Michael Hinton., male    DOB: 12-04-1950  Age: 72 y.o. MRN: [id number]  Chief Complaint  Patient presents with   Follow-up    1 week follow up    1 week follow up appt for BP recheck.  At home BP checks are in the 140's over 80's.  A few times it has been 150's over 90's.  Denies headaches, no dizziness, no chest pains.    No other concerns at this time.   Past Medical History:  Diagnosis Date   Anemia    Aortic atherosclerosis (HCC)    Atrial fibrillation (HCC)    a.) CHA2DS2VASc = 5 (age, HTN, CVA x 2, vascular disease history);  b.) rate/rhythm maintained without pharmacological intervention; chronically anticoagulated with clopidogrel   Bilateral renal cysts 05/29/2022   Carotid artery disease (HCC) 04/11/2022   a.) doppler 04/11/2022: <50 LICA, no sig RICA.   CVA (cerebral vascular accident) [facility])    a.) MRI brain 01/14/2022: numerous chronic cerebellar infarcts; b.) CT head 04/10/2022: small old lacunar infarcts are seen in cerebellum bilaterally   Diastolic dysfunction 04/11/2022   a.) TTE 04/11/2022: EF 55-60%, mild BAE, mild MR, G1DD.   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   T2DM (type 2 diabetes mellitus) (HCC)    Thoracic ascending aortic aneurysm (HCC) 06/01/2022   a.) CT chest - measured 4.2 cm    Past Surgical History:  Procedure  Laterality Date   LAPAROSCOPIC NEPHRECTOMY, HAND ASSISTED Left 07/13/2022   Procedure: HAND ASSISTED LAPAROSCOPIC RADICAL NEPHRECTOMY;  Surgeon: Vanna Scotland, MD;  Location: [facility] ORS;  Service: Urology;  Laterality: Left;   RENAL BIOPSY  05/2022    Social History   Socioeconomic History   Marital status: Married    Spouse name: Felecia   Number of children: Not on file   Years of education: Not on file   Highest education level: Not on file  Occupational History   Not on file  Tobacco Use   Smoking status: Former    Packs/day: 0.50    Years: 7.00    Additional pack years: 0.00    Total pack years: 3.50    Types: Cigarettes    Quit date: 03/15/1978    Years since quitting: 44.8    Passive exposure: Past   Smokeless tobacco: Never  Vaping Use   Vaping Use: Never used  Substance and Sexual Activity   Alcohol use: Not Currently   Drug use: No   Sexual activity: Yes  Other Topics Concern   Not on file  Social History Narrative   Not on file   Social Determinants of Health   Financial Resource Strain: Not on file  Food Insecurity: Not on file  Transportation Needs: Not on file  Physical Activity: Not on file  Stress: Not on file  Social  Connections: Not on file  Intimate Partner Violence: Not on file    Family History  Problem Relation Age of Onset   Heart disease Mother    Diabetes Mother    Breast cancer Mother    Kidney disease Father    Diabetes Father    Diabetes Daughter     No Known Allergies  Review of Systems  Constitutional: Negative.   HENT: Negative.    Eyes: Negative.   Respiratory: Negative.    Cardiovascular: Negative.   Gastrointestinal: Negative.   Genitourinary: Negative.   Musculoskeletal:  Positive for joint pain and myalgias.  Skin: Negative.   Neurological: Negative.   Endo/Heme/Allergies: Negative.   Psychiatric/Behavioral: Negative.         Objective:   BP 136/78   Pulse 70   Ht 5\' 11"  (1.803 m)   Wt 200 lb 9.6 oz  (91 kg)   SpO2 98%   BMI 27.98 kg/m   Vitals:   12/24/22 1109  BP: 136/78  Pulse: 70  Height: 5\' 11"  (1.803 m)  Weight: 200 lb 9.6 oz (91 kg)  SpO2: 98%  BMI (Calculated): 27.99    Physical Exam Vitals reviewed.  Constitutional:      Appearance: Normal appearance.  HENT:     Head: Normocephalic.     Nose: Nose normal.     Mouth/Throat:     Mouth: Mucous membranes are moist.  Eyes:     Pupils: Pupils are equal, round, and reactive to light.  Cardiovascular:     Rate and Rhythm: Normal rate and regular rhythm.  Pulmonary:     Effort: Pulmonary effort is normal.     Breath sounds: Normal breath sounds.  Abdominal:     General: Bowel sounds are normal.     Palpations: Abdomen is soft.  Musculoskeletal:        General: Swelling, tenderness and deformity present.     Cervical back: Normal range of motion and neck supple.  Skin:    General: Skin is warm and dry.  Neurological:     Mental Status: He is alert and oriented to person, place, and time.  Psychiatric:        Mood and Affect: Mood normal.        Behavior: Behavior normal.      No results found for any visits on 12/24/22.  Recent Results (from the past 2160 hour(s))  CBC with Differential/Platelet     Status: Abnormal   Collection Time: 10/23/22 10:31 AM  Result Value Ref Range   WBC 6.0 3.8 - 10.8 Thousand/uL   RBC 4.01 (L) 4.20 - 5.80 Million/uL   Hemoglobin 10.7 (L) 13.2 - 17.1 g/dL   HCT 82.9 (L) 56.2 - 13.0 %   MCV 82.8 80.0 - 100.0 fL   MCH 26.7 (L) 27.0 - 33.0 pg   MCHC 32.2 32.0 - 36.0 g/dL   RDW 86.5 78.4 - 69.6 %   Platelets 215 140 - 400 Thousand/uL   MPV 9.8 7.5 - 12.5 fL   Neutro Abs 4,770 1,500 - 7,800 cells/uL   Lymphs Abs 606 (L) 850 - 3,900 cells/uL   Absolute Monocytes 492 200 - 950 cells/uL   Eosinophils Absolute 102 15 - 500 cells/uL   Basophils Absolute 30 0 - 200 cells/uL   Neutrophils Relative % 79.5 %   Total Lymphocyte 10.1 %   Monocytes Relative 8.2 %   Eosinophils  Relative 1.7 %   Basophils Relative 0.5 %   Smear Review  Comment: Review of peripheral smear confirms automated results.   COMPLETE METABOLIC PANEL WITH GFR     Status: Abnormal   Collection Time: 10/23/22 10:31 AM  Result Value Ref Range   Glucose, Bld 81 65 - 99 mg/dL    Comment: .            Fasting reference interval .    BUN 34 (H) 7 - 25 mg/dL   Creat 1.61 (H) 0.96 - 1.35 mg/dL   eGFR 40 (L) > OR = 60 mL/min/1.12m2   BUN/Creatinine Ratio 19 6 - 22 (calc)   Sodium 139 135 - 146 mmol/L   Potassium 3.9 3.5 - 5.3 mmol/L   Chloride 108 98 - 110 mmol/L   CO2 20 20 - 32 mmol/L   Calcium 10.0 8.6 - 10.3 mg/dL   Total Protein 7.2 6.1 - 8.1 g/dL   Albumin 3.6 3.6 - 5.1 g/dL   Globulin 3.6 1.9 - 3.7 g/dL (calc)   AG Ratio 1.0 1.0 - 2.5 (calc)   Total Bilirubin 0.3 0.2 - 1.2 mg/dL   Alkaline phosphatase (APISO) 86 35 - 144 U/L   AST 17 10 - 35 U/L   ALT 10 9 - 46 U/L      Assessment & Plan:   Problem List Items Addressed This Visit   None   No follow-ups on file.   Total time spent: 15 minutes  Orson Eva, NP  12/24/2022   This document may have been prepared by [facility] Voice Recognition software and as such may include unintentional dictation errors.

## 2022-12-24 NOTE — Patient Instructions (Addendum)
1) July appt with fasting labs prior 2) Increasing losartan from 25 mg to 50 mg daily, patient will monitor BP this week and next and send My Chart Message with results

## 2022-12-25 ENCOUNTER — Other Ambulatory Visit: Payer: Self-pay | Admitting: Nurse Practitioner

## 2022-12-29 ENCOUNTER — Other Ambulatory Visit: Payer: Medicare PPO

## 2023-01-01 ENCOUNTER — Other Ambulatory Visit (HOSPITAL_COMMUNITY): Payer: Self-pay

## 2023-01-05 ENCOUNTER — Other Ambulatory Visit: Payer: Medicare PPO

## 2023-01-05 ENCOUNTER — Other Ambulatory Visit (HOSPITAL_COMMUNITY): Payer: Self-pay

## 2023-01-05 DIAGNOSIS — R972 Elevated prostate specific antigen [PSA]: Secondary | ICD-10-CM

## 2023-01-06 LAB — PSA: Prostate Specific Ag, Serum: 9.2 ng/mL — ABNORMAL HIGH (ref 0.0–4.0)

## 2023-01-07 ENCOUNTER — Other Ambulatory Visit (HOSPITAL_COMMUNITY): Payer: Self-pay

## 2023-01-07 ENCOUNTER — Encounter (HOSPITAL_COMMUNITY): Payer: Self-pay

## 2023-01-12 ENCOUNTER — Other Ambulatory Visit: Payer: Self-pay | Admitting: Physician Assistant

## 2023-01-12 ENCOUNTER — Other Ambulatory Visit (HOSPITAL_COMMUNITY): Payer: Self-pay

## 2023-01-12 DIAGNOSIS — M0579 Rheumatoid arthritis with rheumatoid factor of multiple sites without organ or systems involvement: Secondary | ICD-10-CM

## 2023-01-12 DIAGNOSIS — Z79899 Other long term (current) drug therapy: Secondary | ICD-10-CM

## 2023-01-12 MED ORDER — ORENCIA CLICKJECT 125 MG/ML ~~LOC~~ SOAJ
125.0000 mg | SUBCUTANEOUS | 0 refills | Status: DC
  Filled 2023-01-12: qty 4, 30d supply, fill #0

## 2023-01-12 NOTE — Telephone Encounter (Signed)
Please schedule patient a follow up visit. Patient due May 2024. Thanks!  

## 2023-01-12 NOTE — Telephone Encounter (Signed)
Last Fill: 10/28/2022  Labs: 12/01/2022 RBC 3.83, Hgb 10.4, Hct 31.5, Lymphocytes Absolute 451, Creat. 1.61 GFR 46, Albumin 3.4  TB Skin Test: 03/24/2023 Neg  Next Visit: Due May 2024. Message sent to the front to schedule.   Last Visit: 10/23/2022  [id number] arthritis involving multiple sites with positive rheumatoid facto   Current Dose per office note 10/23/2022: Dub Amis will be 125 mg subcutaneous injections every 14 days   Okay to refill Orencia?

## 2023-01-13 ENCOUNTER — Other Ambulatory Visit (HOSPITAL_COMMUNITY): Payer: Self-pay

## 2023-01-13 ENCOUNTER — Other Ambulatory Visit: Payer: Self-pay | Admitting: Rheumatology

## 2023-01-13 MED ORDER — PREDNISONE 5 MG PO TABS: 5.0000 mg | ORAL_TABLET | Freq: Every day | ORAL | 0 refills | Status: DC

## 2023-01-13 MED ORDER — PREDNISONE 1 MG PO TABS: 1.0000 mg | ORAL_TABLET | Freq: Every day | ORAL | 0 refills | Status: DC

## 2023-01-13 NOTE — Progress Notes (Deleted)
Office Visit Note  Patient: Michael Hinton.             Date of Birth: 01-14-1951           MRN: [id number]             PCP: Orson Eva, NP Referring: Orson Eva, NP Visit Date: 01/21/2023 Occupation: @GUAROCC @  Subjective:    History of Present Illness: Michael Hinton. is a 72 y.o. male with history of seropositive rheumatoid arthritis. He remains on Orencia 125 mg sq injections every 14 days.    CBC and renal function panel updated on 12/01/22.  TB skin test negative on 03/23/22.  Discussed the importance of holding orencia if he develops signs or symptoms of an infection and to resume once the infection has completely cleared.    Activities of Daily Living:  Patient reports morning stiffness for *** {minute/hour:19697}.   Patient {ACTIONS;DENIES/REPORTS:21021675::"Denies"} nocturnal pain.  Difficulty dressing/grooming: {ACTIONS;DENIES/REPORTS:21021675::"Denies"} Difficulty climbing stairs: {ACTIONS;DENIES/REPORTS:21021675::"Denies"} Difficulty getting out of chair: {ACTIONS;DENIES/REPORTS:21021675::"Denies"} Difficulty using hands for taps, buttons, cutlery, and/or writing: {ACTIONS;DENIES/REPORTS:21021675::"Denies"}  No Rheumatology ROS completed.   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   Rheumatoid arthritis involving multiple sites with positive rheumatoid factor (HCC) 06/02/2016   High risk medication use 06/02/2016   Hypertension 06/02/2016   Vitamin D deficiency  06/02/2016    Past Medical History:  Diagnosis Date   Anemia    Aortic atherosclerosis (HCC)    Atrial fibrillation (HCC)    a.) CHA2DS2VASc = 5 (age, HTN, CVA x 2, vascular disease history);  b.) rate/rhythm maintained without pharmacological intervention; chronically anticoagulated with clopidogrel   Bilateral renal cysts 05/29/2022   Carotid artery disease (HCC) 04/11/2022   a.) doppler 04/11/2022: <50 LICA, no sig RICA.   CVA (cerebral vascular accident) [facility])    a.) MRI brain 01/14/2022: numerous chronic cerebellar infarcts; b.) CT head 04/10/2022: small old lacunar infarcts are seen in cerebellum bilaterally   Diastolic dysfunction 04/11/2022   a.) TTE 04/11/2022: EF 55-60%, mild BAE, mild MR, G1DD.   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   T2DM (type 2 diabetes mellitus) (HCC)    Thoracic ascending aortic aneurysm (HCC) 06/01/2022   a.) CT chest - measured 4.2 cm    Family History  Problem Relation Age of Onset   Heart  disease Mother    Diabetes Mother    Breast cancer Mother    Kidney disease Father    Diabetes Father    Diabetes Daughter    Past Surgical History:  Procedure Laterality Date   LAPAROSCOPIC NEPHRECTOMY, HAND ASSISTED Left 07/13/2022   Procedure: HAND ASSISTED LAPAROSCOPIC RADICAL NEPHRECTOMY;  Surgeon: Vanna Scotland, MD;  Location: [facility] ORS;  Service: Urology;  Laterality: Left;   RENAL  BIOPSY  05/2022   Social History   Social History Narrative   Not on file   Immunization History  Administered Date(s) Administered   Moderna Sars-Covid-2 Vaccination 10/12/2019, 11/15/2019   PPD Test 03/20/2022     Objective: Vital Signs: There were no vitals taken for this visit.   Physical Exam Vitals and nursing note reviewed.  Constitutional:      Appearance: He is well-developed.  HENT:     Head: Normocephalic and atraumatic.  Eyes:     Conjunctiva/sclera: Conjunctivae normal.     Pupils: Pupils are equal, round, and reactive to light.  Cardiovascular:     Rate and Rhythm: Normal rate and regular rhythm.     Heart sounds: Normal heart sounds.  Pulmonary:     Effort: Pulmonary effort is normal.     Breath sounds: Normal breath sounds.  Abdominal:     General: Bowel sounds are normal.     Palpations: Abdomen is soft.  Musculoskeletal:     Cervical back: Normal range of motion and neck supple.  Skin:    General: Skin is warm and dry.     Capillary Refill: Capillary refill takes less than 2 seconds.  Neurological:     Mental Status: He is alert and oriented to person, place, and time.  Psychiatric:        Behavior: Behavior normal.      Musculoskeletal Exam: ***  CDAI Exam: CDAI Score: -- Patient Global: --; Provider Global: -- Swollen: --; Tender: -- Joint Exam 01/21/2023   No joint exam has been documented for this visit   There is currently no information documented on the homunculus. Go to the Rheumatology activity and complete the homunculus joint exam.  Investigation: No additional findings.  Imaging: No results found.  Recent Labs: Lab Results  Component Value Date   WBC 6.0 10/23/2022   HGB 10.7 (L) 10/23/2022   PLT 215 10/23/2022   NA 139 10/23/2022   K 3.9 10/23/2022   CL 108 10/23/2022   CO2 20 10/23/2022   GLUCOSE 81 10/23/2022   BUN 34 (H) 10/23/2022   CREATININE 1.81 (H) 10/23/2022   BILITOT 0.3 10/23/2022   ALKPHOS 94  04/10/2022   AST 17 10/23/2022   ALT 10 10/23/2022   PROT 7.2 10/23/2022   ALBUMIN 3.3 (L) 04/10/2022   CALCIUM 10.0 10/23/2022   GFRAA 86 02/03/2021   QFTBGOLD NEGATIVE 06/16/2017   QFTBGOLDPLUS INDETERMINATE (A) 03/06/2022    Speciality Comments: TB Skin Test 03/23/2022: Negative  Procedures:  No procedures performed Allergies: Patient has no known allergies.   Assessment / Plan:     Visit Diagnoses: No diagnosis found.  Orders: No orders of the defined types were placed in this encounter.  No orders of the defined types were placed in this encounter.   Face-to-face time spent with patient was *** minutes. Greater than 50% of time was spent in counseling and coordination of care.  Follow-Up Instructions: No follow-ups on file.   Ellen Henri, CMA  Note - This record has been created using  Editor, commissioning.  Chart creation errors have been sought, but may not always  have been located. Such creation errors do not reflect on  the standard of medical care.

## 2023-01-13 NOTE — Addendum Note (Signed)
Addended by: Henriette Combs on: 01/13/2023 01:13 PM   Modules accepted: Orders

## 2023-01-13 NOTE — Telephone Encounter (Signed)
Last Fill: 12/11/2022  Next Visit: 01/21/2023  Last Visit: 10/23/2022  Dx: Rheumatoid arthritis involving multiple sites with positive rheumatoid factor  Per refill note on 12/11/2022: Patient was supposed to taper prednisone by 1 mg every month according to the office visit note from March 2024.  Patient reduce the dose of prednisone to 1-1/2 tablet p.o. every morning.  Patient states he tolerated tapering to Prednisone 7.5 mg well   Okay to refill Prednisone?  What dose would you like for patient to taper to?

## 2023-01-21 ENCOUNTER — Ambulatory Visit: Payer: Medicare PPO | Admitting: Physician Assistant

## 2023-01-21 DIAGNOSIS — M0579 Rheumatoid arthritis with rheumatoid factor of multiple sites without organ or systems involvement: Secondary | ICD-10-CM

## 2023-01-21 DIAGNOSIS — M24521 Contracture, right elbow: Secondary | ICD-10-CM

## 2023-01-21 DIAGNOSIS — I10 Essential (primary) hypertension: Secondary | ICD-10-CM

## 2023-01-21 DIAGNOSIS — R768 Other specified abnormal immunological findings in serum: Secondary | ICD-10-CM

## 2023-01-21 DIAGNOSIS — D3002 Benign neoplasm of left kidney: Secondary | ICD-10-CM

## 2023-01-21 DIAGNOSIS — Z79899 Other long term (current) drug therapy: Secondary | ICD-10-CM

## 2023-01-21 DIAGNOSIS — E559 Vitamin D deficiency, unspecified: Secondary | ICD-10-CM

## 2023-01-21 DIAGNOSIS — D702 Other drug-induced agranulocytosis: Secondary | ICD-10-CM

## 2023-01-21 DIAGNOSIS — M24522 Contracture, left elbow: Secondary | ICD-10-CM

## 2023-01-21 DIAGNOSIS — M48061 Spinal stenosis, lumbar region without neurogenic claudication: Secondary | ICD-10-CM

## 2023-01-21 DIAGNOSIS — M24561 Contracture, right knee: Secondary | ICD-10-CM

## 2023-01-21 DIAGNOSIS — M5136 Other intervertebral disc degeneration, lumbar region: Secondary | ICD-10-CM

## 2023-01-21 DIAGNOSIS — Z7952 Long term (current) use of systemic steroids: Secondary | ICD-10-CM

## 2023-01-21 NOTE — Progress Notes (Signed)
Office Visit Note  Patient: Michael Hinton.             Date of Birth: 1951/02/27           MRN: [id number]             PCP: Orson Eva, NP Referring: Orson Eva, NP Visit Date: 01/25/2023 Occupation: @GUAROCC @  Subjective:  Medication monitoring   History of Present Illness: Michael Casucci. is a 72 y.o. male with history of rheumatoid arthritis and osteoarthritis.  Patient is currently on orencia 125 mg sq injections every 14 days. Orencia was started on 10/28/22.  Patient states that he has been out of prednisone for the past 2 weeks but was previously taking 6 mg daily.  He states he has noticed about a 50% improvement in his joint pain and inflammation since initiating Orencia.  He continues to have some soreness and stiffness in both hands. He has been tolerating Orencia without any side effects or injection site reactions.  He has not had any recent or recurrent infections.  Activities of Daily Living:  Patient reports morning stiffness for all day.  Patient Denies nocturnal pain.  Difficulty dressing/grooming: Reports Difficulty climbing stairs: Denies Difficulty getting out of chair: Denies Difficulty using hands for taps, buttons, cutlery, and/or writing: Reports  Review of Systems  Constitutional:  Negative for fatigue.  HENT:  Negative for mouth sores and mouth dryness.   Eyes:  Negative for dryness.  Respiratory:  Negative for shortness of breath.   Cardiovascular:  Negative for chest pain and palpitations.  Gastrointestinal:  Negative for blood in stool, constipation and diarrhea.  Endocrine: Negative for increased urination.  Genitourinary:  Negative for involuntary urination.  Musculoskeletal:  Positive for joint pain, joint pain and morning stiffness. Negative for gait problem, joint swelling, myalgias, muscle weakness, muscle tenderness and myalgias.  Skin:  Negative for color change, rash and sensitivity to sunlight.  Allergic/Immunologic: Negative  for susceptible to infections.  Neurological:  Negative for dizziness and headaches.  Hematological:  Negative for swollen glands.  Psychiatric/Behavioral:  Negative for depressed mood and sleep disturbance. The patient is not nervous/anxious.     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   Rheumatoid arthritis involving multiple sites with positive rheumatoid factor (HCC) 06/02/2016   High risk medication use 06/02/2016   Hypertension 06/02/2016   Vitamin D deficiency 06/02/2016    Past Medical History:  Diagnosis Date   Anemia    Aortic atherosclerosis (HCC)    Atrial fibrillation (HCC)    a.) CHA2DS2VASc = 5 (age, HTN, CVA x 2, vascular disease history);  b.) rate/rhythm maintained without pharmacological intervention; chronically anticoagulated with clopidogrel   Bilateral renal cysts 05/29/2022   Carotid artery disease (HCC) 04/11/2022   a.) doppler 04/11/2022: <50 LICA, no sig RICA.   CVA (cerebral vascular accident) [facility])    a.) MRI brain 01/14/2022: numerous chronic cerebellar infarcts; b.) CT head 04/10/2022:  small old lacunar infarcts are seen in cerebellum bilaterally   Diastolic dysfunction 04/11/2022   a.) TTE 04/11/2022: EF 55-60%, mild BAE, mild MR, G1DD.   HLD (hyperlipidemia)    Hypertension 06/02/2016   Long term current use of antithrombotics/antiplatelets    a.) clopidogrel   Long term current use of systemic steroids    a.) prednisone for diagnosis of RA   Neutropenia (HCC)     Renal cell cancer, left (HCC) 04/10/2022   a.) renal US 04/10/2022: solid heterogenous LEFT renal mass measuring 5.2 cm; b.) MRI ABD - 5.3 x 4.3 cm renal mass to posterior lip of LEFT kidney extending into renal sinus; c.)  renal Bx  05/06/2022 - (+) for RCC with clear cell features   Rheumatoid arthritis involving multiple sites with positive rheumatoid factor (HCC) 06/02/2016   T2DM (type 2 diabetes mellitus) (HCC)    Thoracic ascending aortic aneurysm (HCC) 06/01/2022   a.) CT chest - measured 4.2 cm    Family History  Problem Relation Age of Onset   Heart disease Mother    Diabetes Mother    Breast cancer Mother    Kidney disease Father    Diabetes Father    Diabetes Daughter    Past Surgical History:  Procedure Laterality Date   LAPAROSCOPIC NEPHRECTOMY, HAND ASSISTED Left 07/13/2022   Procedure: HAND ASSISTED LAPAROSCOPIC RADICAL NEPHRECTOMY;  Surgeon: Vanna Scotland, MD;  Location: [facility] ORS;  Service: Urology;  Laterality: Left;   RENAL BIOPSY  05/2022   Social History   Social History Narrative   Not on file   Immunization History  Administered Date(s) Administered   Moderna Sars-Covid-2 Vaccination 10/12/2019, 11/15/2019   PPD Test 03/20/2022     Objective: Vital Signs: BP 110/68 (BP Location: Left Arm, Patient Position: Sitting, Cuff Size: Normal)   Pulse 78   Resp 17   Ht 5\' 11"  (1.803 m)   Wt 197 lb 9.6 oz (89.6 kg)   BMI 27.56 kg/m    Physical Exam Vitals and nursing note reviewed.  Constitutional:      Appearance: He is well-developed.  HENT:     Head: Normocephalic and atraumatic.  Eyes:     Conjunctiva/sclera: Conjunctivae normal.     Pupils: Pupils are equal, round, and reactive to light.  Cardiovascular:     Rate and Rhythm: Normal rate and regular rhythm.     Heart sounds: Normal heart sounds.  Pulmonary:     Effort: Pulmonary effort is normal.     Breath sounds: Normal breath sounds.  Abdominal:     General: Bowel sounds are normal.      Palpations: Abdomen is soft.  Musculoskeletal:     Cervical back: Normal range of motion and neck supple.  Skin:    General: Skin is warm and dry.     Capillary Refill: Capillary refill takes less than 2 seconds.  Neurological:     Mental Status: He is alert and oriented to person, place, and time.  Psychiatric:        Behavior: Behavior normal.      Musculoskeletal Exam: C-spine has limited range of motion.  Thoracic kyphosis noted.  Shoulder joints have good range of motion with some stiffness with internal rotation.  Elbow joint flexion contractures are unchanged.  Limited range of motion of both wrist joints.  Synovial thickening of both wrist and all MCP joints.  PIP and DIP thickening.  Hip joints have limited range of motion.  Right knee has a flexion  contracture.  Left knee joint has good range of motion with no warmth or effusion.  Limited range of motion of both ankle joints with synovial thickening but no active synovitis.  CDAI Exam: CDAI Score: -- Patient Global: 4 / 100; Provider Global: 4 / 100 Swollen: --; Tender: -- Joint Exam 01/25/2023   No joint exam has been documented for this visit   There is currently no information documented on the homunculus. Go to the Rheumatology activity and complete the homunculus joint exam.  Investigation: No additional findings.  Imaging: No results found.  Recent Labs: Lab Results  Component Value Date   WBC 6.0 10/23/2022   HGB 10.7 (L) 10/23/2022   PLT 215 10/23/2022   NA 139 10/23/2022   K 3.9 10/23/2022   CL 108 10/23/2022   CO2 20 10/23/2022   GLUCOSE 81 10/23/2022   BUN 34 (H) 10/23/2022   CREATININE 1.81 (H) 10/23/2022   BILITOT 0.3 10/23/2022   ALKPHOS 94 04/10/2022   AST 17 10/23/2022   ALT 10 10/23/2022   PROT 7.2 10/23/2022   ALBUMIN 3.3 (L) 04/10/2022   CALCIUM 10.0 10/23/2022   GFRAA 86 02/03/2021   QFTBGOLD NEGATIVE 06/16/2017   QFTBGOLDPLUS INDETERMINATE (A) 03/06/2022    Speciality Comments:  TB Skin Test 03/23/2022: Negative  Procedures:  No procedures performed Allergies: Patient has no known allergies.         Assessment / Plan:     Visit Diagnoses: Rheumatoid arthritis involving multiple sites with positive rheumatoid factor [facility]): Patient continues to have synovial thickening and inflammation involving multiple joints especially in both hands.  He had difficulty making complete fist with his hands today in the office.  Of note he is not currently experiencing pain or tenderness in his hands but continues to have some thickening and inflammation.  He is currently on Orencia 125 mg sq injections every 14 days.  Dub Amis was initiated on 10/28/2022.  He has been tolerating Orencia without any side effects or injection site reactions.  He has noticed about a 50% improvement in his symptoms since initiating Orencia.  Patient has been trying to taper prednisone by 1 mg every month.  A prescription for prednisone 6 mg is sent to the pharmacy on 01/13/2023 but he has not yet picked up the prescription.  He is planning on picking up the prescription from the pharmacy but overall feels reassured that once he is starting to work since he is not currently having any new or worsening symptoms with the gap and prednisone use.  Patient plans on continuing to follow with the prednisone taper as prescribed tapering by 1 mg every month.  He will remain on Orencia 125 mg sq injections every 14 days.  He will follow-up in the office in 3 months or sooner if needed.  High risk medication use - Orencia started on 10/28/22--He remains on Orencia 125 mg subcutaneous injections every 14 days.  CBC and renal function panel 12/01/22.  Orders for CBC and CMP were released today.  His next lab work will be due in September and every 3 months to monitor for drug toxicity. TB skin test 03/23/22. Due to update PPD in August 2024.  No recent or recurrent infections. Discussed the importance of holding orencia if he  develops signs or symptoms of an infection and to resume once the infection has completely cleared.  Previous therapy includes Enbrel, Humira, and Kevzara.  He previously had to space dosing due to history of lymphopenia. Carlis Abbott was  discontinued in August 2023 at which time he had been spacing he had been spacing dosing to every 18 days. - Plan: CBC with Differential/Platelet, COMPLETE METABOLIC PANEL WITH GFR  Other drug-induced neutropenia (HCC): White blood cell count was 4.6, absolute lymphocytes 451 on 12/01/22. Plan to update CBC with diff today.   Long term systemic steroid user: Currently taking prednisone 6 mg daily.  He has not yet picked up his prescription which was sent on 01/13/2023.Patient plans on continuing to try to taper prednisone by 1 mg every month.  Renal cell adenoma, left: Incidental finding while hospitalized 9/1 to 04/14/2022.  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 Patient was admitted on 07/13/2022 for scheduled hand-assisted laparoscopic left radical nephrectomy with Dr. Apolinar Junes for management.   Left renal cell carcinoma status post uncomplicated radical nephrectomy on 07/13/2022.  Plan to update CT abdomen and pelvis along with chest x-ray in 1 year for surveillance.  No further therapy is indicated.  ANA positive: No clinical features of systemic lupus at this time.   Contracture of left elbow: No tenderness or inflammation noted today.  Contracture of right elbow: No inflammation noted today.   Contractures of both knees: Flexion contracture in the right knee.  Wearing compression brace.  Difficulty ambulating at times.   Other medical conditions are listed as follows:   Vitamin D deficiency: He is taking vitamin D 5000 units daily.   Primary hypertension: BP was 110/68 today in the office.   Spinal stenosis of lumbar region without  neurogenic claudication  DDD (degenerative disc disease), lumbar  Orders: Orders Placed This Encounter  Procedures   CBC with Differential/Platelet   COMPLETE METABOLIC PANEL WITH GFR   No orders of the defined types were placed in this encounter.  Follow-Up Instructions: Return in about 3 months (around 04/27/2023) for Rheumatoid arthritis, Osteoarthritis.   Gearldine Bienenstock, PA-C  Note - This record has been created using Dragon software.  Chart creation errors have been sought, but may not always  have been located. Such creation errors do not reflect on  the standard of medical care.

## 2023-01-25 ENCOUNTER — Ambulatory Visit: Payer: Medicare PPO | Attending: Physician Assistant | Admitting: Physician Assistant

## 2023-01-25 ENCOUNTER — Encounter: Payer: Self-pay | Admitting: Physician Assistant

## 2023-01-25 VITALS — BP 110/68 | HR 78 | Resp 17 | Ht 71.0 in | Wt 197.6 lb

## 2023-01-25 DIAGNOSIS — M24561 Contracture, right knee: Secondary | ICD-10-CM

## 2023-01-25 DIAGNOSIS — M24521 Contracture, right elbow: Secondary | ICD-10-CM

## 2023-01-25 DIAGNOSIS — D3002 Benign neoplasm of left kidney: Secondary | ICD-10-CM

## 2023-01-25 DIAGNOSIS — M24522 Contracture, left elbow: Secondary | ICD-10-CM

## 2023-01-25 DIAGNOSIS — M0579 Rheumatoid arthritis with rheumatoid factor of multiple sites without organ or systems involvement: Secondary | ICD-10-CM | POA: Diagnosis not present

## 2023-01-25 DIAGNOSIS — Z7952 Long term (current) use of systemic steroids: Secondary | ICD-10-CM | POA: Diagnosis not present

## 2023-01-25 DIAGNOSIS — R768 Other specified abnormal immunological findings in serum: Secondary | ICD-10-CM

## 2023-01-25 DIAGNOSIS — I10 Essential (primary) hypertension: Secondary | ICD-10-CM

## 2023-01-25 DIAGNOSIS — D702 Other drug-induced agranulocytosis: Secondary | ICD-10-CM | POA: Diagnosis not present

## 2023-01-25 DIAGNOSIS — Z79899 Other long term (current) drug therapy: Secondary | ICD-10-CM

## 2023-01-25 DIAGNOSIS — M48061 Spinal stenosis, lumbar region without neurogenic claudication: Secondary | ICD-10-CM

## 2023-01-25 DIAGNOSIS — M5136 Other intervertebral disc degeneration, lumbar region: Secondary | ICD-10-CM

## 2023-01-25 DIAGNOSIS — M24562 Contracture, left knee: Secondary | ICD-10-CM

## 2023-01-25 DIAGNOSIS — E559 Vitamin D deficiency, unspecified: Secondary | ICD-10-CM

## 2023-01-25 NOTE — Patient Instructions (Signed)
Orencia 125 mg sq injections every 14 days  Prednisone 6 mg by mouth daily   Standing Labs We placed an order today for your standing lab work.   Please have your standing labs drawn in September and every 3 months.   Please have your labs drawn 2 weeks prior to your appointment so that the provider can discuss your lab results at your appointment, if possible.  Please note that you may see your imaging and lab results in MyChart before we have reviewed them. We will contact you once all results are reviewed. Please allow our office up to 72 hours to thoroughly review all of the results before contacting the office for clarification of your results.  WALK-IN LAB HOURS  Monday through Thursday from 8:00 am -12:30 pm and 1:00 pm-5:00 pm and Friday from 8:00 am-12:00 pm.  Patients with office visits requiring labs will be seen before walk-in labs.  You may encounter longer than normal wait times. Please allow additional time. Wait times may be shorter on  Monday and Thursday afternoons.  We do not book appointments for walk-in labs. We appreciate your patience and understanding with our staff.   Labs are drawn by [facility]. Please bring your co-pay at the time of your lab draw.  You may receive a bill from [facility] for your lab work.  Please note if you are on Hydroxychloroquine and and an order has been placed for a Hydroxychloroquine level,  you will need to have it drawn 4 hours or more after your last dose.  If you wish to have your labs drawn at another location, please call the office 24 hours in advance so we can fax the orders.  The office is located at [address], [address], [place], [place] [address]   If you have any questions regarding directions or hours of operation,  please call [phone].   As a reminder, please drink plenty of water prior to coming for your lab work. Thanks!

## 2023-01-26 ENCOUNTER — Telehealth: Payer: Self-pay | Admitting: *Deleted

## 2023-01-26 ENCOUNTER — Telehealth: Payer: Self-pay

## 2023-01-26 DIAGNOSIS — Z79899 Other long term (current) drug therapy: Secondary | ICD-10-CM

## 2023-01-26 LAB — CBC WITH DIFFERENTIAL/PLATELET
Absolute Monocytes: 254 cells/uL (ref 200–950)
Basophils Absolute: 11 cells/uL (ref 0–200)
Basophils Relative: 0.4 %
Eosinophils Absolute: 81 cells/uL (ref 15–500)
Eosinophils Relative: 3 %
HCT: 31.6 % — ABNORMAL LOW (ref 38.5–50.0)
Hemoglobin: 10.3 g/dL — ABNORMAL LOW (ref 13.2–17.1)
Lymphs Abs: 556 cells/uL — ABNORMAL LOW (ref 850–3900)
MCH: 27.7 pg (ref 27.0–33.0)
MCHC: 32.6 g/dL (ref 32.0–36.0)
MCV: 84.9 fL (ref 80.0–100.0)
MPV: 9.5 fL (ref 7.5–12.5)
Monocytes Relative: 9.4 %
Neutro Abs: 1798 cells/uL (ref 1500–7800)
Neutrophils Relative %: 66.6 %
Platelets: 225 10*3/uL (ref 140–400)
RBC: 3.72 10*6/uL — ABNORMAL LOW (ref 4.20–5.80)
RDW: 14.6 % (ref 11.0–15.0)
Total Lymphocyte: 20.6 %
WBC: 2.7 10*3/uL — ABNORMAL LOW (ref 3.8–10.8)

## 2023-01-26 LAB — COMPLETE METABOLIC PANEL WITH GFR
AG Ratio: 1.1 (calc) (ref 1.0–2.5)
ALT: 11 U/L (ref 9–46)
AST: 15 U/L (ref 10–35)
Albumin: 3.7 g/dL (ref 3.6–5.1)
Alkaline phosphatase (APISO): 98 U/L (ref 35–144)
BUN/Creatinine Ratio: 17 (calc) (ref 6–22)
BUN: 31 mg/dL — ABNORMAL HIGH (ref 7–25)
CO2: 19 mmol/L — ABNORMAL LOW (ref 20–32)
Calcium: 9.8 mg/dL (ref 8.6–10.3)
Chloride: 109 mmol/L (ref 98–110)
Creat: 1.85 mg/dL — ABNORMAL HIGH (ref 0.70–1.35)
Globulin: 3.4 g/dL (calc) (ref 1.9–3.7)
Glucose, Bld: 77 mg/dL (ref 65–99)
Potassium: 4 mmol/L (ref 3.5–5.3)
Sodium: 139 mmol/L (ref 135–146)
Total Bilirubin: 0.5 mg/dL (ref 0.2–1.2)
Total Protein: 7.1 g/dL (ref 6.1–8.1)
eGFR: 39 mL/min/{1.73_m2} — ABNORMAL LOW (ref >=60)

## 2023-01-26 NOTE — Telephone Encounter (Signed)
-----   Message from Gearldine Bienenstock, PA-C sent at 01/26/2023  1:52 PM EDT ----- Creatinine remains elevated-1.85 and GFR is low-39. Rest of CMP WNL.  Avoid the use of NSAIDs.  Patient remains anemic.  WBC count is low.  Absolute lymphocytes remain low-556.  Recommend rechecking CBC with diff in 2 weeks.

## 2023-01-26 NOTE — Telephone Encounter (Signed)
Attempted to schedule patient's 3 month follow up appointment and patient stated he will call back to schedule. He was unable to do so right now.

## 2023-01-26 NOTE — Progress Notes (Signed)
Creatinine remains elevated-1.85 and GFR is low-39. Rest of CMP WNL.  Avoid the use of NSAIDs.  Patient remains anemic.  WBC count is low.  Absolute lymphocytes remain low-556.  Recommend rechecking CBC with diff in 2 weeks.

## 2023-02-09 ENCOUNTER — Ambulatory Visit: Payer: Medicare PPO | Admitting: Physician Assistant

## 2023-02-16 ENCOUNTER — Other Ambulatory Visit: Payer: Medicare PPO | Admitting: Urology

## 2023-02-21 ENCOUNTER — Other Ambulatory Visit: Payer: Self-pay | Admitting: Rheumatology

## 2023-02-22 NOTE — Telephone Encounter (Signed)
Patient advised ok to reduce prednisone to 5 mg on 02/24/23

## 2023-02-22 NOTE — Telephone Encounter (Signed)
Last Fill: 01/13/2023  Next Visit: 04/28/2023  Last Visit: 01/25/2023  Dx: Rheumatoid arthritis involving multiple sites with positive rheumatoid factor   Current Dose per office note on 01/25/2023: prednisone 6 mg aper prednisone by 1 mg every month.   Patient states he is currently on Prednisone 6 mg. Patient states he is unsure when he to taper 5 mg per month.   Okay to refill Prednisone?  At what dose?

## 2023-02-22 NOTE — Telephone Encounter (Signed)
Ok to reduce prednisone to 5 mg on 02/24/23

## 2023-02-23 ENCOUNTER — Ambulatory Visit: Payer: Medicare PPO | Admitting: Nurse Practitioner

## 2023-02-23 ENCOUNTER — Ambulatory Visit: Payer: Medicare PPO | Admitting: Urology

## 2023-02-23 VITALS — BP 128/70 | HR 92 | Ht 71.0 in | Wt 197.0 lb

## 2023-02-23 DIAGNOSIS — Z2989 Encounter for other specified prophylactic measures: Secondary | ICD-10-CM | POA: Diagnosis not present

## 2023-02-23 DIAGNOSIS — C61 Malignant neoplasm of prostate: Secondary | ICD-10-CM

## 2023-02-23 DIAGNOSIS — R972 Elevated prostate specific antigen [PSA]: Secondary | ICD-10-CM

## 2023-02-23 DIAGNOSIS — N4232 Atypical small acinar proliferation of prostate: Secondary | ICD-10-CM

## 2023-02-23 MED ORDER — LEVOFLOXACIN 500 MG PO TABS
500.0000 mg | ORAL_TABLET | Freq: Once | ORAL | Status: AC
  Administered 2023-02-23: 500 mg via ORAL

## 2023-02-23 MED ORDER — GENTAMICIN SULFATE 40 MG/ML IJ SOLN
80.0000 mg | Freq: Once | INTRAMUSCULAR | Status: AC
  Administered 2023-02-23: 80 mg via INTRAMUSCULAR

## 2023-02-23 NOTE — Progress Notes (Signed)
   02/23/23  CC:  Chief Complaint  Patient presents with   Prostate Biopsy    HPI: 72 year old male with a personal history of elevated/rising PSA who presents today for biopsy.  Notably, he underwent prostate MRI which was fairly unremarkable.  Blood pressure 128/70, pulse 92, height 5\' 11"  (1.803 m), weight 197 lb (89.4 kg). NED. A&Ox3.   No respiratory distress   Abd soft, NT, ND Normal sphincter tone  Prostate Biopsy Procedure   Informed consent was obtained after discussing risks/benefits of the procedure.  A time out was performed to ensure correct patient identity.  Pre-Procedure: - Gentamicin given prophylactically - Levaquin 500 mg administered PO -Transrectal Ultrasound performed revealing prostate measurements of 2.01 x 5.1 x 4.24 cm  -No significant hypoechoic or median lobe noted  Procedure: - Prostate block performed using 10 cc 1% lidocaine and biopsies taken from sextant areas, a total of 12 under ultrasound guidance.  Post-Procedure: - Patient tolerated the procedure well - He was counseled to seek immediate medical attention if experiences any severe pain, significant bleeding, or fevers - Return in one week to discuss biopsy results   Vanna Scotland, MD

## 2023-02-23 NOTE — Progress Notes (Signed)
IM Injection  Patient is present today for an IM Injection for treatment of Gentamicin Drug: Gentamicin Dose:2 ml Location:[facility] Lot: [id number] Exp:02/08/2024 Patient tolerated well, no complications were noted  Performed by: Vale Haven Magallon-Mariche RMA  Additional notes/ Follow up: 2 weeks  ( 03/09/2023)

## 2023-02-24 ENCOUNTER — Ambulatory Visit: Payer: Medicare PPO | Admitting: Urology

## 2023-03-02 ENCOUNTER — Ambulatory Visit: Payer: Medicare PPO | Admitting: Nurse Practitioner

## 2023-03-02 ENCOUNTER — Other Ambulatory Visit (HOSPITAL_COMMUNITY): Payer: Self-pay

## 2023-03-02 ENCOUNTER — Other Ambulatory Visit: Payer: Self-pay | Admitting: Physician Assistant

## 2023-03-02 ENCOUNTER — Other Ambulatory Visit: Payer: Self-pay

## 2023-03-02 DIAGNOSIS — M0579 Rheumatoid arthritis with rheumatoid factor of multiple sites without organ or systems involvement: Secondary | ICD-10-CM

## 2023-03-02 DIAGNOSIS — Z79899 Other long term (current) drug therapy: Secondary | ICD-10-CM

## 2023-03-02 MED ORDER — ORENCIA CLICKJECT 125 MG/ML ~~LOC~~ SOAJ
125.0000 mg | SUBCUTANEOUS | 0 refills | Status: DC
  Filled 2023-03-02: qty 4, 30d supply, fill #0

## 2023-03-02 NOTE — Telephone Encounter (Signed)
Last Fill: 01/12/2023  Labs: 01/25/2023 Creatinine remains elevated-1.85 and GFR is low-39. Rest of CMP WNL. Patient remains anemic.  WBC count is low.  Absolute lymphocytes remain low-556.   TB Skin Test 03/23/2022: Negative  Next Visit: 04/28/2023  Last Visit: 01/25/2023  [id number] arthritis involving multiple sites with positive rheumatoid factor   Current Dose per office note 01/25/2023: Orencia 125 mg sq injections every 14 days.   Okay to refill Orencia?

## 2023-03-09 ENCOUNTER — Other Ambulatory Visit: Payer: Self-pay

## 2023-03-09 ENCOUNTER — Ambulatory Visit (INDEPENDENT_AMBULATORY_CARE_PROVIDER_SITE_OTHER): Payer: Medicare PPO | Admitting: Urology

## 2023-03-09 VITALS — BP 134/74 | HR 88 | Ht 71.0 in | Wt 200.0 lb

## 2023-03-09 DIAGNOSIS — Z85528 Personal history of other malignant neoplasm of kidney: Secondary | ICD-10-CM

## 2023-03-09 DIAGNOSIS — C642 Malignant neoplasm of left kidney, except renal pelvis: Secondary | ICD-10-CM

## 2023-03-09 DIAGNOSIS — C61 Malignant neoplasm of prostate: Secondary | ICD-10-CM

## 2023-03-09 NOTE — Progress Notes (Signed)
Marcelle Overlie Plume,acting as a scribe for Vanna Scotland, MD.,have documented all relevant documentation on the behalf of Vanna Scotland, MD,as directed by  Vanna Scotland, MD while in the presence of Vanna Scotland, MD.  03/09/2023 2:00 PM   Michael Hinton. 1951-01-30 [id number]  Referring provider: Orson Eva, NP [address] [place],  [place] [address]  Chief Complaint  Patient presents with   Results    HPI: 72 year-old male who presents today for follow up prostate biopsy results.   He has a personal history of elevated PSA and renal cell carcinoma. More recently, his PSA had risen to 8.1 in 08/2022.   He had a prostate MRI completed in 09/2022 that showed a 21 gram prostate with heterogenous intensity representing inflammation. It was PI-RADS 2.   His PSA continues to rise to 9.1 in 12/2022 which led to a prostate biopsy that showed Gleason 3+4 as well as 4+3 lesion at the left lateral base and left lateral mid up to 37% of the tissue.  Today, he denies any issues with erectile dysfunction. He does note rheumatoid arthritis in his knee.       PMH: Past Medical History:  Diagnosis Date   Anemia    Aortic atherosclerosis (HCC)    Atrial fibrillation (HCC)    a.) CHA2DS2VASc = 5 (age, HTN, CVA x 2, vascular disease history);  b.) rate/rhythm maintained without pharmacological intervention; chronically anticoagulated with clopidogrel   Bilateral renal cysts 05/29/2022   Carotid artery disease (HCC) 04/11/2022   a.) doppler 04/11/2022: <50 LICA, no sig RICA.   CVA (cerebral vascular accident) [facility])    a.) MRI brain 01/14/2022: numerous chronic cerebellar infarcts; b.) CT head 04/10/2022: small old lacunar infarcts are seen in cerebellum bilaterally   Diastolic dysfunction 04/11/2022   a.) TTE 04/11/2022: EF 55-60%, mild BAE, mild MR, G1DD.   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 If you have any questions, ask your nurse or doctor.          amLODipine 10 MG tablet Commonly known as: NORVASC TAKE 1 TABLET BY MOUTH IN THE MORNING   folic acid 1 MG tablet Commonly known as: FOLVITE Take 1 tablet (1 mg total)  by mouth daily.   IRON PO Take 1 tablet by mouth daily.   losartan 50 MG tablet Commonly known as: COZAAR Take 1 tablet (50 mg total) by mouth daily.   Orencia ClickJect 125 MG/ML Soaj Generic drug: Abatacept Inject 125 mg into the skin every 14 (fourteen) days.   predniSONE 1 MG tablet Commonly known as: DELTASONE TAKE 1 TABLET BY MOUTH ONCE DAILY WITH BREAKFAST. TAKE ALONG WITH 5 MG TABLET TO EQUAL 6 MG DAILY   predniSONE 5 MG tablet Commonly known as: DELTASONE Take 1 tablet (5 mg total) by mouth daily with breakfast.   rosuvastatin 40 MG tablet Commonly known as: CRESTOR Take  40 mg by mouth daily.   Vitamin D-3 125 MCG (5000 UT) Tabs Take 1 tablet by mouth daily.        Family History: Family History  Problem Relation Age of Onset   Heart disease Mother    Diabetes Mother    Breast cancer Mother    Kidney disease Father    Diabetes Father    Diabetes Daughter     Social History:  reports that he quit smoking about 45 years ago. His smoking use included cigarettes. He started smoking about 52 years ago. He has a 3.5 pack-year smoking history. He has been exposed to tobacco smoke. He has never used smokeless tobacco. He reports that he does not currently use alcohol. He reports that he does not use drugs.   Physical Exam: BP 134/74   Pulse 88   Ht 5\' 11"  (1.803 m)   Wt 200 lb (90.7 kg)   BMI 27.89 kg/m   Constitutional:  Alert and oriented, No acute distress.  Wife and grandson present today. HEENT: [place] AT, moist mucus membranes.  Trachea midline, no masses. Neurologic: Grossly intact, no focal deficits, moving all 4 extremities. Psychiatric: Normal mood and affect.   Assessment & Plan:    1. Newly diagnosed, unfavorable, intermediate risk prostate cancer - The patient was counseled about the natural history of prostate cancer and the standard treatment options that are available for prostate cancer. It was explained to him how his age and life expectancy, clinical stage, Gleason score, and PSA affect his prognosis, the decision to proceed with additional staging studies, as well as how that information influences recommended treatment strategies. We discussed the roles for active surveillance, radiation therapy, surgical therapy, androgen deprivation, as well as ablative therapy options for the treatment of prostate cancer as appropriate to his individual cancer situation. We discussed the risks and benefits of these options with regard to their impact on cancer control and also in terms of potential adverse events, complications, and impact on  quality of life particularly related to urinary, bowel, and sexual function. The patient was encouraged to ask questions throughout the discussion today and all questions were answered to his stated satisfaction. In addition, the patient was provided with and/or directed to appropriate resources and literature for further education about prostate cancer treatment options.  We discussed surgical therapy for prostate cancer including the different available surgical approaches.  Specifically, we discussed robotic prostatectomy with pelvic lymph node dissection based on his restratification.  We discussed, in detail, the risks and expectations of surgery with regard to cancer control, urinary control, and erectile dysfunction as well as expected post operative recovery processed. Additional risks of surgery including but not limited to bleeding, infection, hernia formation, nerve damage, fistula formation, bowel/rectal injury, potentially necessitating colostomy, damage to the urinary tract resulting in urinary  leakage, urethral stricture, and cardiopulmonary risk such as myocardial infarction, stroke, death, thromboembolism etc. were explained.   - Recommend treatment based on unfavorable component. He prefers radiation therapy due to concerns about potential urinary incontinence and erectile dysfunction associated with surgery.  - We do not believe he needs any further imaging in light of his negative prostate MRI and surveillance CT for his clear cell renal cell carcinoma  - Referral to Dr. Rushie Chestnut in Radiation Oncology for consultation.  - Consider hormone therapy in conjunction with radiation to improve efficacy, 57-month Depo.  We discussed possible side effects of this including hot flashes, loss of muscle mass, sexual side effects amongst others.  He is also given literature about this today as well.  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  I have reviewed the above documentation for accuracy and completeness, and I agree with the above.   Vanna Scotland, MD    [facility] [address], [address] [place], [place] [address] [phone]  I spent 46 total minutes on the day of the encounter including pre-visit review of the medical record, face-to-face time with the patient, and post visit ordering of labs/imaging/tests.

## 2023-03-09 NOTE — Patient Instructions (Signed)

## 2023-03-15 ENCOUNTER — Other Ambulatory Visit: Payer: Self-pay | Admitting: Nurse Practitioner

## 2023-03-18 ENCOUNTER — Ambulatory Visit: Payer: Medicare PPO | Admitting: Nurse Practitioner

## 2023-03-23 ENCOUNTER — Other Ambulatory Visit: Payer: Self-pay | Admitting: Physician Assistant

## 2023-03-23 NOTE — Telephone Encounter (Signed)
Last Fill: 02/22/2023   Next Visit: 04/28/2023   Last Visit: 01/25/2023   Dx: Rheumatoid arthritis involving multiple sites with positive rheumatoid factor    Current Dose per office note on 01/25/2023: prednisone 6 mg aper prednisone by 1 mg every month.    Patient states he is currently on Prednisone 6 mg. He did not taper to 5 mg on 02/24/2023 as instructed.    Okay to refill Prednisone?

## 2023-03-24 ENCOUNTER — Ambulatory Visit
Admission: RE | Admit: 2023-03-24 | Discharge: 2023-03-24 | Disposition: A | Discharge status: Home / Self Care | Payer: Medicare HMO | Source: Ambulatory Visit | Attending: Radiation Oncology | Admitting: Radiation Oncology

## 2023-03-24 ENCOUNTER — Encounter: Payer: Self-pay | Admitting: Radiation Oncology

## 2023-03-24 VITALS — BP 128/86 | HR 68 | Temp 98.0°F | Resp 20 | Ht 71.0 in | Wt 200.0 lb

## 2023-03-24 DIAGNOSIS — Z7902 Long term (current) use of antithrombotics/antiplatelets: Secondary | ICD-10-CM | POA: Insufficient documentation

## 2023-03-24 DIAGNOSIS — C61 Malignant neoplasm of prostate: Secondary | ICD-10-CM | POA: Diagnosis not present

## 2023-03-24 DIAGNOSIS — Z79899 Other long term (current) drug therapy: Secondary | ICD-10-CM | POA: Diagnosis not present

## 2023-03-24 DIAGNOSIS — E119 Type 2 diabetes mellitus without complications: Secondary | ICD-10-CM | POA: Diagnosis not present

## 2023-03-24 DIAGNOSIS — Z85528 Personal history of other malignant neoplasm of kidney: Secondary | ICD-10-CM | POA: Diagnosis not present

## 2023-03-24 DIAGNOSIS — R351 Nocturia: Secondary | ICD-10-CM | POA: Insufficient documentation

## 2023-03-24 DIAGNOSIS — Z8673 Personal history of transient ischemic attack (TIA), and cerebral infarction without residual deficits: Secondary | ICD-10-CM | POA: Insufficient documentation

## 2023-03-24 DIAGNOSIS — Z87891 Personal history of nicotine dependence: Secondary | ICD-10-CM | POA: Diagnosis not present

## 2023-03-24 DIAGNOSIS — I10 Essential (primary) hypertension: Secondary | ICD-10-CM | POA: Insufficient documentation

## 2023-03-24 DIAGNOSIS — E785 Hyperlipidemia, unspecified: Secondary | ICD-10-CM | POA: Diagnosis not present

## 2023-03-24 DIAGNOSIS — I4891 Unspecified atrial fibrillation: Secondary | ICD-10-CM | POA: Insufficient documentation

## 2023-03-24 DIAGNOSIS — Z191 Hormone sensitive malignancy status: Secondary | ICD-10-CM | POA: Diagnosis not present

## 2023-03-24 DIAGNOSIS — R300 Dysuria: Secondary | ICD-10-CM | POA: Diagnosis not present

## 2023-03-24 DIAGNOSIS — K5900 Constipation, unspecified: Secondary | ICD-10-CM | POA: Insufficient documentation

## 2023-03-24 DIAGNOSIS — Z7952 Long term (current) use of systemic steroids: Secondary | ICD-10-CM | POA: Diagnosis not present

## 2023-03-24 NOTE — Consult Note (Signed)
NEW PATIENT EVALUATION  Name: Michael Hinton.  MRN: [id number]  Date:   03/24/2023     DOB: 1950-09-11   This 72 y.o. male patient presents to the clinic for initial evaluation of stage IIc (cT1 cN0 M0) Gleason 7 (4+3) adenocarcinoma the prostate presenting with a PSA of 9  REFERRING PHYSICIAN: Orson Eva, NP  CHIEF COMPLAINT:  Chief Complaint  Patient presents with   Prostate Cancer    DIAGNOSIS: The encounter diagnosis was Malignant neoplasm of prostate (HCC).   PREVIOUS INVESTIGATIONS:  MRI scan reviewed Clinical notes reviewed Pathology report reviewed  HPI: Patient is a 72 year old male status post laparoscopic assisted radical nephrectomy left for renal cell carcinoma.  He was noted by urology to have a rising PSA eventually climbed to 9.  Prostate MRI showed no significant area for definitive involvement of malignancy did have PI-RADS 2 findings.  Patient does also have a 4.2 cm ascending thoracic aortic dilatation.  He underwent biopsy showing 2 of 12 cores positive for Gleason 7 adenocarcinoma both 4+3 and 3+4.  He is fairly asymptomatic has nocturia x 3.dysuria bowels tend towards constipation.  He is having no bone pain.  Options of surgical excision were reviewed by urology he is interested in radiation therapy and is seen today for opinion.  PLANNED TREATMENT REGIMEN: Image guided IMRT radiation therapy  PAST MEDICAL HISTORY:  has a past medical history of Anemia, Aortic atherosclerosis (HCC), Atrial fibrillation (HCC), Bilateral renal cysts (05/29/2022), Carotid artery disease (HCC) (04/11/2022), CVA (cerebral vascular accident) (HCC), Diastolic dysfunction (04/11/2022), HLD (hyperlipidemia), Hypertension (06/02/2016), Long term current use of antithrombotics/antiplatelets, Long term current use of systemic steroids, Neutropenia (HCC), Renal cell cancer, left (HCC) (04/10/2022), Rheumatoid arthritis involving multiple sites with positive rheumatoid factor (HCC)  (06/02/2016), T2DM (type 2 diabetes mellitus) (HCC), and Thoracic ascending aortic aneurysm (HCC) (06/01/2022).    PAST SURGICAL HISTORY:  Past Surgical History:  Procedure Laterality Date   LAPAROSCOPIC NEPHRECTOMY, HAND ASSISTED Left 07/13/2022   Procedure: HAND ASSISTED LAPAROSCOPIC RADICAL NEPHRECTOMY;  Surgeon: Vanna Scotland, MD;  Location: [facility] ORS;  Service: Urology;  Laterality: Left;   RENAL BIOPSY  05/2022    FAMILY HISTORY: family history includes Breast cancer in his mother; Diabetes in his daughter, father, and mother; Heart disease in his mother; Kidney disease in his father.  SOCIAL HISTORY:  reports that he quit smoking about 45 years ago. His smoking use included cigarettes. He started smoking about 52 years ago. He has a 3.5 pack-year smoking history. He has been exposed to tobacco smoke. He has never used smokeless tobacco. He reports that he does not currently use alcohol. He reports that he does not use drugs.  ALLERGIES: Patient has no known allergies.  MEDICATIONS:  Current Outpatient Medications  Medication Sig Dispense Refill   Abatacept (ORENCIA CLICKJECT) 125 MG/ML SOAJ Inject 125 mg into the skin every 14 (fourteen) days. 4 mL 0   amLODipine (NORVASC) 10 MG tablet TAKE 1 TABLET BY MOUTH IN THE MORNING 90 tablet 0   Cholecalciferol (VITAMIN D-3) 125 MCG (5000 UT) TABS Take 1 tablet by mouth daily.     Ferrous Sulfate (IRON PO) Take 1 tablet by mouth daily.     folic acid (FOLVITE) 1 MG tablet Take 1 tablet (1 mg total) by mouth daily. 30 tablet 1   losartan (COZAAR) 50 MG tablet Take 1 tablet (50 mg total) by mouth daily. 90 tablet 1   predniSONE (DELTASONE) 5 MG tablet Take 1 tablet (5 mg  total) by mouth daily with breakfast. 30 tablet 0   predniSONE (DELTASONE) 5 MG tablet Take 1 tablet (5 mg total) by mouth daily with breakfast. 30 tablet 0   rosuvastatin (CRESTOR) 40 MG tablet Take 40 mg by mouth daily.     No current facility-administered medications for  this encounter.    ECOG PERFORMANCE STATUS:  0 - Asymptomatic  REVIEW OF SYSTEMS: Patient has history of left-sided renal cell carcinoma status post laparoscopic removal.  Also has a cyst thoracic ascending aortic dilatation Patient denies any weight loss, fatigue, weakness, fever, chills or night sweats. Patient denies any loss of vision, blurred vision. Patient denies any ringing  of the ears or hearing loss. No irregular heartbeat. Patient denies heart murmur or history of fainting. Patient denies any chest pain or pain radiating to her upper extremities. Patient denies any shortness of breath, difficulty breathing at night, cough or hemoptysis. Patient denies any swelling in the lower legs. Patient denies any nausea vomiting, vomiting of blood, or coffee ground material in the vomitus. Patient denies any stomach pain. Patient states has had normal bowel movements no significant constipation or diarrhea. Patient denies any dysuria, hematuria or significant nocturia. Patient denies any problems walking, swelling in the joints or loss of balance. Patient denies any skin changes, loss of hair or loss of weight. Patient denies any excessive worrying or anxiety or significant depression. Patient denies any problems with insomnia. Patient denies excessive thirst, polyuria, polydipsia. Patient denies any swollen glands, patient denies easy bruising or easy bleeding. Patient denies any recent infections, allergies or URI. Patient "s visual fields have not changed significantly in recent time.   PHYSICAL EXAM: BP 128/86   Pulse 68   Temp 98 F (36.7 C) (Tympanic)   Resp 20   Ht 5\' 11"  (1.803 m) Comment: stated HT  Wt 200 lb (90.7 kg)   BMI 27.89 kg/m  Well-developed well-nourished patient in NAD. HEENT reveals PERLA, EOMI, discs not visualized.  Oral cavity is clear. No oral mucosal lesions are identified. Neck is clear without evidence of cervical or supraclavicular adenopathy. Lungs are clear to A&P.  Cardiac examination is essentially unremarkable with regular rate and rhythm without murmur rub or thrill. Abdomen is benign with no organomegaly or masses noted. Motor sensory and DTR levels are equal and symmetric in the upper and lower extremities. Cranial nerves II through XII are grossly intact. Proprioception is intact. No peripheral adenopathy or edema is identified. No motor or sensory levels are noted. Crude visual fields are within normal range.  LABORATORY DATA: Pathology reports reviewed    RADIOLOGY RESULTS: MRI scan reviewed compatible with above-stated findings CT scan of the chest also reviewed   IMPRESSION: Stage IIc Gleason 7 (4+3) adenocarcinoma the prostate low-volume disease and 72 year old male  PLAN: This time I have recommended image guided IMRT radiation therapy.  I will plan on delivering 80 Gray over 8 weeks to his prostate.  I have asked Dr. Apolinar Junes to place fiducial markers in his prostate.  I also like him to undergo Eligard 30-month depot concurrently with radiation.  Risks and benefits of treatment occluding increased lower urinary tract symptoms diarrhea fatigue alteration blood counts skin reaction all were reviewed in detail with the patient.  He comprehends my recommendations well.  We will arrange with Dr. Theora Gianotti office for both [facility] and markers to be placed.  We will then set up simulation.  Patient and family comprehend my recommendations well.  I would like to take  this opportunity to thank you for allowing me to participate in the care of your patient.Carmina Miller, MD

## 2023-03-26 ENCOUNTER — Telehealth: Payer: Self-pay

## 2023-03-26 NOTE — Telephone Encounter (Signed)
No PA required for [place] [employer].   No PA required for [facility].   Pt scheduled.

## 2023-03-26 NOTE — Telephone Encounter (Signed)
-----   Message from Nurse Cyril Mourning sent at 03/24/2023  2:42 PM EDT ----- Please schedule appointment for Markers and Eligard. Thanks, Vernona Rieger

## 2023-03-29 ENCOUNTER — Other Ambulatory Visit: Payer: Self-pay | Admitting: Internal Medicine

## 2023-04-01 ENCOUNTER — Ambulatory Visit (INDEPENDENT_AMBULATORY_CARE_PROVIDER_SITE_OTHER): Payer: Medicare HMO | Admitting: Cardiology

## 2023-04-01 ENCOUNTER — Encounter: Payer: Self-pay | Admitting: Cardiology

## 2023-04-01 VITALS — BP 140/70 | HR 67 | Ht 71.0 in | Wt 202.4 lb

## 2023-04-01 DIAGNOSIS — M47812 Spondylosis without myelopathy or radiculopathy, cervical region: Secondary | ICD-10-CM | POA: Diagnosis not present

## 2023-04-01 DIAGNOSIS — S161XXA Strain of muscle, fascia and tendon at neck level, initial encounter: Secondary | ICD-10-CM | POA: Diagnosis not present

## 2023-04-01 DIAGNOSIS — M542 Cervicalgia: Secondary | ICD-10-CM | POA: Diagnosis not present

## 2023-04-01 DIAGNOSIS — I10 Essential (primary) hypertension: Secondary | ICD-10-CM | POA: Diagnosis not present

## 2023-04-01 DIAGNOSIS — E782 Mixed hyperlipidemia: Secondary | ICD-10-CM | POA: Diagnosis not present

## 2023-04-01 DIAGNOSIS — M5412 Radiculopathy, cervical region: Secondary | ICD-10-CM | POA: Diagnosis not present
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
## 2023-04-05 DIAGNOSIS — N1831 Chronic kidney disease, stage 3a: Secondary | ICD-10-CM | POA: Diagnosis not present

## 2023-04-05 DIAGNOSIS — I10 Essential (primary) hypertension: Secondary | ICD-10-CM | POA: Diagnosis not present

## 2023-04-13 DIAGNOSIS — D631 Anemia in chronic kidney disease: Secondary | ICD-10-CM | POA: Diagnosis not present

## 2023-04-13 DIAGNOSIS — I10 Essential (primary) hypertension: Secondary | ICD-10-CM | POA: Diagnosis not present

## 2023-04-13 DIAGNOSIS — N1832 Chronic kidney disease, stage 3b: Secondary | ICD-10-CM | POA: Diagnosis not present

## 2023-04-14 NOTE — Progress Notes (Deleted)
Office Visit Note  Patient: Michael Hinton.             Date of Birth: 23-May-1951           MRN: [id number]             PCP: Orson Eva, NP (Inactive) Referring: Orson Eva, NP Visit Date: 04/28/2023 Occupation: @GUAROCC @  Subjective:    History of Present Illness: Michael Hinton. is a 72 y.o. male with history of seropositive rheumatoid arthritis.  Orencia started on 10/28/22--He remains on Orencia 125 mg subcutaneous injections every 14 days.    Incidental finding while hospitalized 9/1 to 04/14/2022.  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 Patient was admitted on 07/13/2022 for scheduled hand-assisted laparoscopic left radical nephrectomy with Dr. Apolinar Junes for management.   Left renal cell carcinoma status post uncomplicated radical nephrectomy on 07/13/2022.  Plan to update CT abdomen and pelvis along with chest x-ray in 1 year for surveillance.  No further therapy is indicated.   CBC and renal panel updated on 04/05/23.  TB skin test 03/23/22  Discussed the importance of holding orencia if he develops signs or symptoms of an infection and to resume once the infection has completely cleared.   Activities of Daily Living:  Patient reports morning stiffness for *** {minute/hour:19697}.   Patient {ACTIONS;DENIES/REPORTS:21021675::"Denies"} nocturnal pain.  Difficulty dressing/grooming: {ACTIONS;DENIES/REPORTS:21021675::"Denies"} Difficulty climbing stairs: {ACTIONS;DENIES/REPORTS:21021675::"Denies"} Difficulty getting out of chair: {ACTIONS;DENIES/REPORTS:21021675::"Denies"} Difficulty using hands for taps, buttons, cutlery, and/or writing: {ACTIONS;DENIES/REPORTS:21021675::"Denies"}  No Rheumatology ROS completed.   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   Rheumatoid arthritis involving multiple sites with positive rheumatoid factor (HCC) 06/02/2016   High risk medication use 06/02/2016   Hypertension 06/02/2016   Vitamin D deficiency 06/02/2016    Past Medical History:  Diagnosis Date   Anemia    Aortic atherosclerosis (HCC)    Atrial fibrillation (HCC)    a.) CHA2DS2VASc = 5 (age, HTN, CVA x 2, vascular disease history);  b.) rate/rhythm maintained without pharmacological intervention; chronically anticoagulated with clopidogrel   Bilateral renal cysts 05/29/2022   Carotid artery disease (HCC) 04/11/2022   a.) doppler 04/11/2022: <50 LICA, no sig RICA.   CVA (cerebral vascular accident) [facility])    a.) MRI brain 01/14/2022: numerous chronic cerebellar infarcts; b.) CT head 04/10/2022: small old lacunar infarcts are seen in cerebellum bilaterally   Diastolic dysfunction 04/11/2022   a.) TTE 04/11/2022: EF 55-60%, mild BAE, mild MR, G1DD.   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   a.) renal US 04/10/2022: solid  heterogenous LEFT renal mass measuring 5.2 cm; b.) MRI ABD - 5.3 x 4.3 cm renal mass to posterior lip of LEFT kidney extending into renal sinus; c.)  renal Bx  05/06/2022 - (+) for RCC with clear cell features   Rheumatoid arthritis involving multiple sites with  positive rheumatoid factor (HCC) 06/02/2016   T2DM (type 2 diabetes mellitus) (HCC)    Thoracic ascending aortic aneurysm (HCC) 06/01/2022   a.) CT chest - measured 4.2 cm    Family History  Problem Relation Age of Onset   Heart disease Mother    Diabetes Mother    Breast cancer Mother    Kidney disease Father    Diabetes Father    Diabetes Daughter    Past Surgical History:  Procedure Laterality Date   LAPAROSCOPIC NEPHRECTOMY, HAND ASSISTED Left 07/13/2022   Procedure: HAND ASSISTED LAPAROSCOPIC RADICAL NEPHRECTOMY;  Surgeon: Vanna Scotland, MD;  Location: [facility] ORS;  Service: Urology;  Laterality: Left;   RENAL BIOPSY  05/2022   Social History   Social History Narrative   Not on file   Immunization History  Administered Date(s) Administered   Moderna Sars-Covid-2 Vaccination 10/12/2019, 11/15/2019   PPD Test 03/20/2022     Objective: Vital Signs: There were no vitals taken for this visit.   Physical Exam Vitals and nursing note reviewed.  Constitutional:      Appearance: He is well-developed.  HENT:     Head: Normocephalic and atraumatic.  Eyes:     Conjunctiva/sclera: Conjunctivae normal.     Pupils: Pupils are equal, round, and reactive to light.  Cardiovascular:     Rate and Rhythm: Normal rate and regular rhythm.     Heart sounds: Normal heart sounds.  Pulmonary:     Effort: Pulmonary effort is normal.     Breath sounds: Normal breath sounds.  Abdominal:     General: Bowel sounds are normal.     Palpations: Abdomen is soft.  Musculoskeletal:     Cervical back: Normal range of motion and neck supple.  Skin:    General: Skin is warm and dry.     Capillary Refill: Capillary refill takes less than 2 seconds.  Neurological:     Mental Status: He is alert and oriented to person, place, and time.  Psychiatric:        Behavior: Behavior normal.      Musculoskeletal Exam: ***  CDAI Exam: CDAI Score: -- Patient Global: --; Provider Global: -- Swollen:  --; Tender: -- Joint Exam 04/28/2023   No joint exam has been documented for this visit   There is currently no information documented on the homunculus. Go to the Rheumatology activity and complete the homunculus joint exam.  Investigation: No additional findings.  Imaging: No results found.  Recent Labs: Lab Results  Component Value Date   WBC 2.7 (L) 01/25/2023   HGB 10.3 (L) 01/25/2023   PLT 225 01/25/2023   NA 139 01/25/2023   K 4.0 01/25/2023   CL 109 01/25/2023   CO2 19 (L) 01/25/2023   GLUCOSE 77 01/25/2023   BUN 31 (H) 01/25/2023   CREATININE 1.85 (H) 01/25/2023   BILITOT 0.5 01/25/2023   ALKPHOS 94 04/10/2022   AST 15 01/25/2023   ALT 11 01/25/2023   PROT 7.1 01/25/2023   ALBUMIN 3.3 (L) 04/10/2022   CALCIUM 9.8 01/25/2023   GFRAA 86 02/03/2021   QFTBGOLD NEGATIVE 06/16/2017   QFTBGOLDPLUS INDETERMINATE (A) 03/06/2022    Speciality Comments: TB Skin Test 03/23/2022: Negative  Procedures:  No procedures performed Allergies: Patient has no known allergies.   Assessment / Plan:     Visit Diagnoses: Rheumatoid arthritis involving multiple sites with positive rheumatoid factor (HCC)  High risk medication use  Other drug-induced neutropenia (HCC)  Long term systemic steroid user  Renal cell adenoma, left  ANA positive  Contracture of left elbow  Contracture of right elbow  Contractures of both knees  Vitamin D deficiency  Primary hypertension  Spinal stenosis of lumbar region without neurogenic claudication  DDD (degenerative disc disease), lumbar  Left renal mass  Orders: No orders of the defined types were placed in this encounter.  No orders of the defined types were placed in this encounter.   Face-to-face time spent with patient was *** minutes. Greater than 50% of time was spent in counseling and coordination of care.  Follow-Up Instructions: No follow-ups on file.   Gearldine Bienenstock, PA-C  Note - This record has been created  using Dragon software.  Chart creation errors have been sought, but may not always  have been located. Such creation errors do not reflect on  the standard of medical care.

## 2023-04-22 ENCOUNTER — Other Ambulatory Visit: Payer: Self-pay | Admitting: Physician Assistant

## 2023-04-22 NOTE — Telephone Encounter (Signed)
Last Fill: 02/22/2023  Next Visit: 04/28/2023  Last Visit: 01/25/2023  Dx: Rheumatoid arthritis involving multiple sites with positive rheumatoid factor   Current Dose per office note on 01/25/2023: prednisone 6 mg aper prednisone by 1 mg every month.   Patient states he has tapered to 5 mg and has been doing well. Patient due to taper to 4 mg po daily for 1 month.   Okay to refill Prednisone?

## 2023-04-26 DIAGNOSIS — Z510 Encounter for antineoplastic radiation therapy: Secondary | ICD-10-CM | POA: Diagnosis not present

## 2023-04-26 DIAGNOSIS — C61 Malignant neoplasm of prostate: Secondary | ICD-10-CM | POA: Diagnosis not present

## 2023-04-26 DIAGNOSIS — Z191 Hormone sensitive malignancy status: Secondary | ICD-10-CM | POA: Diagnosis not present

## 2023-04-27 ENCOUNTER — Other Ambulatory Visit (HOSPITAL_COMMUNITY): Payer: Self-pay

## 2023-04-27 ENCOUNTER — Ambulatory Visit: Payer: Medicare HMO | Admitting: Urology

## 2023-04-27 VITALS — BP 136/80 | HR 75

## 2023-04-27 DIAGNOSIS — C61 Malignant neoplasm of prostate: Secondary | ICD-10-CM | POA: Diagnosis not present

## 2023-04-27 DIAGNOSIS — Z2989 Encounter for other specified prophylactic measures: Secondary | ICD-10-CM | POA: Diagnosis not present

## 2023-04-27 MED ORDER — LEUPROLIDE ACETATE (6 MONTH) 45 MG ~~LOC~~ KIT
45.0000 mg | PACK | Freq: Once | SUBCUTANEOUS | Status: AC
  Administered 2023-04-27: 45 mg via SUBCUTANEOUS

## 2023-04-27 MED ORDER — LEVOFLOXACIN 500 MG PO TABS
500.0000 mg | ORAL_TABLET | Freq: Once | ORAL | Status: AC
  Administered 2023-04-27: 500 mg via ORAL

## 2023-04-27 MED ORDER — GENTAMICIN SULFATE 40 MG/ML IJ SOLN
80.0000 mg | Freq: Once | INTRAMUSCULAR | Status: AC
  Administered 2023-04-27: 80 mg via INTRAMUSCULAR

## 2023-04-27 NOTE — Progress Notes (Signed)
Eligard SubQ Injection   Due to Prostate Cancer patient is present today for a Eligard Injection.  Medication: Eligard 6 month Dose: 45 mg  Location: left arm Lot: [id number] Exp: 10/2024  Patient tolerated well, no complications were noted  Performed by: Cason Luffman H RMA  Per Dr. Apolinar Junes patient for 1 time dose. Continue on Vitamin D 800-1000iu and Calcium 1000-1200mg  daily while on Androgen Deprivation Therapy.  PA approval dates:  NO PA required

## 2023-04-27 NOTE — Progress Notes (Signed)
04/27/23  CC: gold seed markers  HPI: 72 y.o. male with prostate cancer who presents today for placement of fiducial seed markers in anticipation of his upcoming IMRT with Dr. Rushie Chestnut.  He is also due to receive his 12-month Depo of Eligard today.  All questions were answered.  Prostate Gold Seed Marker Placement Procedure   Informed consent was obtained after discussing risks/benefits of the procedure.  A time out was performed to ensure correct patient identity.  Pre-Procedure: - Gentamicin given prophylactically - PO Levaquin 500 mg also given today  Procedure: -Lidocaine jelly was administered per rectum -Rectal ultrasound probe was placed without difficulty and the prostate visualized - 3 fiducial gold seed markers placed, one at right base, one at left base, one at apex of prostate gland under transrectal ultrasound guidance  Post-Procedure: - Patient tolerated the procedure well - He was counseled to seek immediate medical attention if experiences any severe pain, significant bleeding, or fevers    F/u 6 mo w/ PSA or sooner as needed  Vanna Scotland, MD

## 2023-04-28 ENCOUNTER — Ambulatory Visit: Payer: Medicare PPO | Admitting: Physician Assistant

## 2023-04-28 DIAGNOSIS — R768 Other specified abnormal immunological findings in serum: Secondary | ICD-10-CM

## 2023-04-28 DIAGNOSIS — D702 Other drug-induced agranulocytosis: Secondary | ICD-10-CM

## 2023-04-28 DIAGNOSIS — M5136 Other intervertebral disc degeneration, lumbar region: Secondary | ICD-10-CM

## 2023-04-28 DIAGNOSIS — E559 Vitamin D deficiency, unspecified: Secondary | ICD-10-CM

## 2023-04-28 DIAGNOSIS — Z7952 Long term (current) use of systemic steroids: Secondary | ICD-10-CM

## 2023-04-28 DIAGNOSIS — M24521 Contracture, right elbow: Secondary | ICD-10-CM

## 2023-04-28 DIAGNOSIS — N2889 Other specified disorders of kidney and ureter: Secondary | ICD-10-CM

## 2023-04-28 DIAGNOSIS — D3002 Benign neoplasm of left kidney: Secondary | ICD-10-CM

## 2023-04-28 DIAGNOSIS — I10 Essential (primary) hypertension: Secondary | ICD-10-CM

## 2023-04-28 DIAGNOSIS — M48061 Spinal stenosis, lumbar region without neurogenic claudication: Secondary | ICD-10-CM

## 2023-04-28 DIAGNOSIS — M24522 Contracture, left elbow: Secondary | ICD-10-CM

## 2023-04-28 DIAGNOSIS — M0579 Rheumatoid arthritis with rheumatoid factor of multiple sites without organ or systems involvement: Secondary | ICD-10-CM

## 2023-04-28 DIAGNOSIS — M24561 Contracture, right knee: Secondary | ICD-10-CM

## 2023-04-28 DIAGNOSIS — Z79899 Other long term (current) drug therapy: Secondary | ICD-10-CM

## 2023-04-29 ENCOUNTER — Ambulatory Visit
Admission: RE | Admit: 2023-04-29 | Discharge: 2023-04-29 | Disposition: A | Discharge status: Home / Self Care | Payer: Medicare HMO | Source: Ambulatory Visit | Attending: Radiation Oncology | Admitting: Radiation Oncology

## 2023-04-29 DIAGNOSIS — K5900 Constipation, unspecified: Secondary | ICD-10-CM | POA: Diagnosis not present

## 2023-04-29 DIAGNOSIS — I10 Essential (primary) hypertension: Secondary | ICD-10-CM | POA: Diagnosis not present

## 2023-04-29 DIAGNOSIS — Z79899 Other long term (current) drug therapy: Secondary | ICD-10-CM | POA: Diagnosis not present

## 2023-04-29 DIAGNOSIS — Z8673 Personal history of transient ischemic attack (TIA), and cerebral infarction without residual deficits: Secondary | ICD-10-CM | POA: Diagnosis not present

## 2023-04-29 DIAGNOSIS — C61 Malignant neoplasm of prostate: Secondary | ICD-10-CM | POA: Insufficient documentation

## 2023-04-29 DIAGNOSIS — Z7902 Long term (current) use of antithrombotics/antiplatelets: Secondary | ICD-10-CM | POA: Insufficient documentation

## 2023-04-29 DIAGNOSIS — Z87891 Personal history of nicotine dependence: Secondary | ICD-10-CM | POA: Diagnosis not present

## 2023-04-29 DIAGNOSIS — E785 Hyperlipidemia, unspecified: Secondary | ICD-10-CM | POA: Insufficient documentation

## 2023-04-29 DIAGNOSIS — I4891 Unspecified atrial fibrillation: Secondary | ICD-10-CM | POA: Insufficient documentation

## 2023-04-29 DIAGNOSIS — R300 Dysuria: Secondary | ICD-10-CM | POA: Insufficient documentation

## 2023-04-29 DIAGNOSIS — Z7952 Long term (current) use of systemic steroids: Secondary | ICD-10-CM | POA: Diagnosis not present

## 2023-04-29 DIAGNOSIS — R351 Nocturia: Secondary | ICD-10-CM | POA: Insufficient documentation

## 2023-04-29 DIAGNOSIS — Z510 Encounter for antineoplastic radiation therapy: Secondary | ICD-10-CM | POA: Diagnosis not present

## 2023-04-29 DIAGNOSIS — E119 Type 2 diabetes mellitus without complications: Secondary | ICD-10-CM | POA: Diagnosis not present

## 2023-04-29 DIAGNOSIS — Z191 Hormone sensitive malignancy status: Secondary | ICD-10-CM | POA: Diagnosis not present

## 2023-04-29 DIAGNOSIS — Z85528 Personal history of other malignant neoplasm of kidney: Secondary | ICD-10-CM | POA: Insufficient documentation

## 2023-05-05 DIAGNOSIS — I4891 Unspecified atrial fibrillation: Secondary | ICD-10-CM | POA: Diagnosis not present

## 2023-05-05 DIAGNOSIS — Z510 Encounter for antineoplastic radiation therapy: Secondary | ICD-10-CM | POA: Diagnosis not present

## 2023-05-05 DIAGNOSIS — K5900 Constipation, unspecified: Secondary | ICD-10-CM | POA: Diagnosis not present

## 2023-05-05 DIAGNOSIS — R351 Nocturia: Secondary | ICD-10-CM | POA: Diagnosis not present

## 2023-05-05 DIAGNOSIS — E785 Hyperlipidemia, unspecified: Secondary | ICD-10-CM | POA: Diagnosis not present

## 2023-05-05 DIAGNOSIS — Z7952 Long term (current) use of systemic steroids: Secondary | ICD-10-CM | POA: Diagnosis not present

## 2023-05-05 DIAGNOSIS — Z191 Hormone sensitive malignancy status: Secondary | ICD-10-CM | POA: Diagnosis not present

## 2023-05-05 DIAGNOSIS — I10 Essential (primary) hypertension: Secondary | ICD-10-CM | POA: Diagnosis not present

## 2023-05-05 DIAGNOSIS — C61 Malignant neoplasm of prostate: Secondary | ICD-10-CM | POA: Diagnosis not present

## 2023-05-05 DIAGNOSIS — E119 Type 2 diabetes mellitus without complications: Secondary | ICD-10-CM | POA: Diagnosis not present

## 2023-05-05 DIAGNOSIS — R300 Dysuria: Secondary | ICD-10-CM | POA: Diagnosis not present

## 2023-05-06 ENCOUNTER — Other Ambulatory Visit: Payer: Self-pay | Admitting: *Deleted

## 2023-05-06 DIAGNOSIS — C61 Malignant neoplasm of prostate: Secondary | ICD-10-CM

## 2023-05-10 ENCOUNTER — Ambulatory Visit: Admission: RE | Admit: 2023-05-10 | Payer: Medicare HMO | Source: Ambulatory Visit

## 2023-05-10 DIAGNOSIS — Z510 Encounter for antineoplastic radiation therapy: Secondary | ICD-10-CM | POA: Diagnosis not present

## 2023-05-10 DIAGNOSIS — C61 Malignant neoplasm of prostate: Secondary | ICD-10-CM | POA: Diagnosis not present

## 2023-05-10 DIAGNOSIS — Z191 Hormone sensitive malignancy status: Secondary | ICD-10-CM | POA: Diagnosis not present

## 2023-05-11 ENCOUNTER — Ambulatory Visit
Admission: RE | Admit: 2023-05-11 | Discharge: 2023-05-11 | Disposition: A | Discharge status: Home / Self Care | Payer: Medicare PPO | Source: Ambulatory Visit | Attending: Radiation Oncology | Admitting: Radiation Oncology

## 2023-05-11 ENCOUNTER — Other Ambulatory Visit: Payer: Self-pay

## 2023-05-11 DIAGNOSIS — Z85528 Personal history of other malignant neoplasm of kidney: Secondary | ICD-10-CM | POA: Diagnosis not present

## 2023-05-11 DIAGNOSIS — I4891 Unspecified atrial fibrillation: Secondary | ICD-10-CM | POA: Insufficient documentation

## 2023-05-11 DIAGNOSIS — E119 Type 2 diabetes mellitus without complications: Secondary | ICD-10-CM | POA: Insufficient documentation

## 2023-05-11 DIAGNOSIS — C61 Malignant neoplasm of prostate: Secondary | ICD-10-CM | POA: Diagnosis not present

## 2023-05-11 DIAGNOSIS — Z7952 Long term (current) use of systemic steroids: Secondary | ICD-10-CM | POA: Insufficient documentation

## 2023-05-11 DIAGNOSIS — Z191 Hormone sensitive malignancy status: Secondary | ICD-10-CM | POA: Diagnosis not present

## 2023-05-11 DIAGNOSIS — Z87891 Personal history of nicotine dependence: Secondary | ICD-10-CM | POA: Diagnosis not present

## 2023-05-11 DIAGNOSIS — Z7902 Long term (current) use of antithrombotics/antiplatelets: Secondary | ICD-10-CM | POA: Diagnosis not present

## 2023-05-11 DIAGNOSIS — R351 Nocturia: Secondary | ICD-10-CM | POA: Diagnosis not present

## 2023-05-11 DIAGNOSIS — I10 Essential (primary) hypertension: Secondary | ICD-10-CM | POA: Diagnosis not present

## 2023-05-11 DIAGNOSIS — Z79899 Other long term (current) drug therapy: Secondary | ICD-10-CM | POA: Diagnosis not present

## 2023-05-11 DIAGNOSIS — E785 Hyperlipidemia, unspecified: Secondary | ICD-10-CM | POA: Diagnosis not present

## 2023-05-11 DIAGNOSIS — Z510 Encounter for antineoplastic radiation therapy: Secondary | ICD-10-CM | POA: Diagnosis not present

## 2023-05-11 DIAGNOSIS — Z8673 Personal history of transient ischemic attack (TIA), and cerebral infarction without residual deficits: Secondary | ICD-10-CM | POA: Insufficient documentation

## 2023-05-11 DIAGNOSIS — K5900 Constipation, unspecified: Secondary | ICD-10-CM | POA: Insufficient documentation

## 2023-05-11 DIAGNOSIS — R300 Dysuria: Secondary | ICD-10-CM | POA: Insufficient documentation

## 2023-05-11 LAB — RAD ONC ARIA SESSION SUMMARY
Course Elapsed Days: 0
Plan Fractions Treated to Date: 1
Plan Prescribed Dose Per Fraction: 2.5 Gy
Plan Total Fractions Prescribed: 28
Plan Total Prescribed Dose: 70 Gy
Reference Point Dosage Given to Date: 2.5 Gy
Reference Point Session Dosage Given: 2.5 Gy
Session Number: 1

## 2023-05-12 ENCOUNTER — Ambulatory Visit: Payer: Medicare PPO

## 2023-05-13 ENCOUNTER — Ambulatory Visit
Admission: RE | Admit: 2023-05-13 | Discharge: 2023-05-13 | Disposition: A | Discharge status: Home / Self Care | Payer: Medicare PPO | Source: Ambulatory Visit | Attending: Radiation Oncology | Admitting: Radiation Oncology

## 2023-05-13 ENCOUNTER — Other Ambulatory Visit: Payer: Self-pay

## 2023-05-13 DIAGNOSIS — E119 Type 2 diabetes mellitus without complications: Secondary | ICD-10-CM | POA: Diagnosis not present

## 2023-05-13 DIAGNOSIS — I10 Essential (primary) hypertension: Secondary | ICD-10-CM | POA: Diagnosis not present

## 2023-05-13 DIAGNOSIS — R351 Nocturia: Secondary | ICD-10-CM | POA: Diagnosis not present

## 2023-05-13 DIAGNOSIS — Z191 Hormone sensitive malignancy status: Secondary | ICD-10-CM | POA: Diagnosis not present

## 2023-05-13 DIAGNOSIS — Z7952 Long term (current) use of systemic steroids: Secondary | ICD-10-CM | POA: Diagnosis not present

## 2023-05-13 DIAGNOSIS — C61 Malignant neoplasm of prostate: Secondary | ICD-10-CM | POA: Diagnosis not present

## 2023-05-13 DIAGNOSIS — E785 Hyperlipidemia, unspecified: Secondary | ICD-10-CM | POA: Diagnosis not present

## 2023-05-13 DIAGNOSIS — I4891 Unspecified atrial fibrillation: Secondary | ICD-10-CM | POA: Diagnosis not present

## 2023-05-13 DIAGNOSIS — Z510 Encounter for antineoplastic radiation therapy: Secondary | ICD-10-CM | POA: Diagnosis not present

## 2023-05-13 DIAGNOSIS — R300 Dysuria: Secondary | ICD-10-CM | POA: Diagnosis not present

## 2023-05-13 DIAGNOSIS — K5900 Constipation, unspecified: Secondary | ICD-10-CM | POA: Diagnosis not present

## 2023-05-13 LAB — RAD ONC ARIA SESSION SUMMARY
Course Elapsed Days: 2
Plan Fractions Treated to Date: 2
Plan Prescribed Dose Per Fraction: 2.5 Gy
Plan Total Fractions Prescribed: 28
Plan Total Prescribed Dose: 70 Gy
Reference Point Dosage Given to Date: 5 Gy
Reference Point Session Dosage Given: 2.5 Gy
Session Number: 2

## 2023-05-14 ENCOUNTER — Ambulatory Visit
Admission: RE | Admit: 2023-05-14 | Discharge: 2023-05-14 | Disposition: A | Discharge status: Home / Self Care | Payer: Medicare PPO | Source: Ambulatory Visit | Attending: Radiation Oncology | Admitting: Radiation Oncology

## 2023-05-14 ENCOUNTER — Other Ambulatory Visit: Payer: Self-pay

## 2023-05-14 DIAGNOSIS — Z510 Encounter for antineoplastic radiation therapy: Secondary | ICD-10-CM | POA: Diagnosis not present

## 2023-05-14 DIAGNOSIS — E119 Type 2 diabetes mellitus without complications: Secondary | ICD-10-CM | POA: Diagnosis not present

## 2023-05-14 DIAGNOSIS — R351 Nocturia: Secondary | ICD-10-CM | POA: Diagnosis not present

## 2023-05-14 DIAGNOSIS — K5900 Constipation, unspecified: Secondary | ICD-10-CM | POA: Diagnosis not present

## 2023-05-14 DIAGNOSIS — Z7952 Long term (current) use of systemic steroids: Secondary | ICD-10-CM | POA: Diagnosis not present

## 2023-05-14 DIAGNOSIS — I10 Essential (primary) hypertension: Secondary | ICD-10-CM | POA: Diagnosis not present

## 2023-05-14 DIAGNOSIS — I4891 Unspecified atrial fibrillation: Secondary | ICD-10-CM | POA: Diagnosis not present

## 2023-05-14 DIAGNOSIS — R300 Dysuria: Secondary | ICD-10-CM | POA: Diagnosis not present

## 2023-05-14 DIAGNOSIS — C61 Malignant neoplasm of prostate: Secondary | ICD-10-CM | POA: Diagnosis not present

## 2023-05-14 DIAGNOSIS — Z191 Hormone sensitive malignancy status: Secondary | ICD-10-CM | POA: Diagnosis not present

## 2023-05-14 DIAGNOSIS — E785 Hyperlipidemia, unspecified: Secondary | ICD-10-CM | POA: Diagnosis not present

## 2023-05-14 LAB — RAD ONC ARIA SESSION SUMMARY
Course Elapsed Days: 3
Plan Fractions Treated to Date: 3
Plan Prescribed Dose Per Fraction: 2.5 Gy
Plan Total Fractions Prescribed: 28
Plan Total Prescribed Dose: 70 Gy
Reference Point Dosage Given to Date: 7.5 Gy
Reference Point Session Dosage Given: 2.5 Gy
Session Number: 3

## 2023-05-17 ENCOUNTER — Other Ambulatory Visit: Payer: Self-pay

## 2023-05-17 ENCOUNTER — Inpatient Hospital Stay: Payer: Medicare PPO

## 2023-05-17 ENCOUNTER — Ambulatory Visit
Admission: RE | Admit: 2023-05-17 | Discharge: 2023-05-17 | Disposition: A | Discharge status: Home / Self Care | Payer: Medicare PPO | Source: Ambulatory Visit | Attending: Radiation Oncology | Admitting: Radiation Oncology

## 2023-05-17 DIAGNOSIS — C61 Malignant neoplasm of prostate: Secondary | ICD-10-CM | POA: Insufficient documentation

## 2023-05-17 DIAGNOSIS — E785 Hyperlipidemia, unspecified: Secondary | ICD-10-CM | POA: Diagnosis not present

## 2023-05-17 DIAGNOSIS — R351 Nocturia: Secondary | ICD-10-CM | POA: Diagnosis not present

## 2023-05-17 DIAGNOSIS — E119 Type 2 diabetes mellitus without complications: Secondary | ICD-10-CM | POA: Diagnosis not present

## 2023-05-17 DIAGNOSIS — Z191 Hormone sensitive malignancy status: Secondary | ICD-10-CM | POA: Diagnosis not present

## 2023-05-17 DIAGNOSIS — I4891 Unspecified atrial fibrillation: Secondary | ICD-10-CM | POA: Diagnosis not present

## 2023-05-17 DIAGNOSIS — I10 Essential (primary) hypertension: Secondary | ICD-10-CM | POA: Diagnosis not present

## 2023-05-17 DIAGNOSIS — Z7952 Long term (current) use of systemic steroids: Secondary | ICD-10-CM | POA: Diagnosis not present

## 2023-05-17 DIAGNOSIS — Z510 Encounter for antineoplastic radiation therapy: Secondary | ICD-10-CM | POA: Diagnosis not present

## 2023-05-17 DIAGNOSIS — K5900 Constipation, unspecified: Secondary | ICD-10-CM | POA: Diagnosis not present

## 2023-05-17 DIAGNOSIS — R300 Dysuria: Secondary | ICD-10-CM | POA: Diagnosis not present

## 2023-05-17 LAB — RAD ONC ARIA SESSION SUMMARY
Course Elapsed Days: 6
Plan Fractions Treated to Date: 4
Plan Prescribed Dose Per Fraction: 2.5 Gy
Plan Total Fractions Prescribed: 28
Plan Total Prescribed Dose: 70 Gy
Reference Point Dosage Given to Date: 10 Gy
Reference Point Session Dosage Given: 2.5 Gy
Session Number: 4

## 2023-05-18 ENCOUNTER — Ambulatory Visit
Admission: RE | Admit: 2023-05-18 | Discharge: 2023-05-18 | Disposition: A | Discharge status: Home / Self Care | Payer: Medicare PPO | Source: Ambulatory Visit | Attending: Radiation Oncology | Admitting: Radiation Oncology

## 2023-05-18 ENCOUNTER — Other Ambulatory Visit: Payer: Self-pay

## 2023-05-18 DIAGNOSIS — Z510 Encounter for antineoplastic radiation therapy: Secondary | ICD-10-CM | POA: Diagnosis not present

## 2023-05-18 DIAGNOSIS — Z191 Hormone sensitive malignancy status: Secondary | ICD-10-CM | POA: Diagnosis not present

## 2023-05-18 DIAGNOSIS — R351 Nocturia: Secondary | ICD-10-CM | POA: Diagnosis not present

## 2023-05-18 DIAGNOSIS — Z7952 Long term (current) use of systemic steroids: Secondary | ICD-10-CM | POA: Diagnosis not present

## 2023-05-18 DIAGNOSIS — K5900 Constipation, unspecified: Secondary | ICD-10-CM | POA: Diagnosis not present

## 2023-05-18 DIAGNOSIS — I10 Essential (primary) hypertension: Secondary | ICD-10-CM | POA: Diagnosis not present

## 2023-05-18 DIAGNOSIS — I4891 Unspecified atrial fibrillation: Secondary | ICD-10-CM | POA: Diagnosis not present

## 2023-05-18 DIAGNOSIS — R300 Dysuria: Secondary | ICD-10-CM | POA: Diagnosis not present

## 2023-05-18 DIAGNOSIS — E119 Type 2 diabetes mellitus without complications: Secondary | ICD-10-CM | POA: Diagnosis not present

## 2023-05-18 DIAGNOSIS — C61 Malignant neoplasm of prostate: Secondary | ICD-10-CM | POA: Diagnosis not present

## 2023-05-18 DIAGNOSIS — E785 Hyperlipidemia, unspecified: Secondary | ICD-10-CM | POA: Diagnosis not present

## 2023-05-18 LAB — RAD ONC ARIA SESSION SUMMARY
Course Elapsed Days: 7
Plan Fractions Treated to Date: 5
Plan Prescribed Dose Per Fraction: 2.5 Gy
Plan Total Fractions Prescribed: 28
Plan Total Prescribed Dose: 70 Gy
Reference Point Dosage Given to Date: 12.5 Gy
Reference Point Session Dosage Given: 2.5 Gy
Session Number: 5

## 2023-05-19 ENCOUNTER — Ambulatory Visit
Admission: RE | Admit: 2023-05-19 | Discharge: 2023-05-19 | Disposition: A | Discharge status: Home / Self Care | Payer: Medicare PPO | Source: Ambulatory Visit | Attending: Radiation Oncology | Admitting: Radiation Oncology

## 2023-05-19 ENCOUNTER — Other Ambulatory Visit: Payer: Self-pay

## 2023-05-19 DIAGNOSIS — Z7952 Long term (current) use of systemic steroids: Secondary | ICD-10-CM | POA: Diagnosis not present

## 2023-05-19 DIAGNOSIS — I4891 Unspecified atrial fibrillation: Secondary | ICD-10-CM | POA: Diagnosis not present

## 2023-05-19 DIAGNOSIS — R300 Dysuria: Secondary | ICD-10-CM | POA: Diagnosis not present

## 2023-05-19 DIAGNOSIS — C61 Malignant neoplasm of prostate: Secondary | ICD-10-CM | POA: Diagnosis not present

## 2023-05-19 DIAGNOSIS — Z510 Encounter for antineoplastic radiation therapy: Secondary | ICD-10-CM | POA: Diagnosis not present

## 2023-05-19 DIAGNOSIS — Z191 Hormone sensitive malignancy status: Secondary | ICD-10-CM | POA: Diagnosis not present

## 2023-05-19 DIAGNOSIS — E785 Hyperlipidemia, unspecified: Secondary | ICD-10-CM | POA: Diagnosis not present

## 2023-05-19 DIAGNOSIS — E119 Type 2 diabetes mellitus without complications: Secondary | ICD-10-CM | POA: Diagnosis not present

## 2023-05-19 DIAGNOSIS — I10 Essential (primary) hypertension: Secondary | ICD-10-CM | POA: Diagnosis not present

## 2023-05-19 DIAGNOSIS — R351 Nocturia: Secondary | ICD-10-CM | POA: Diagnosis not present

## 2023-05-19 DIAGNOSIS — K5900 Constipation, unspecified: Secondary | ICD-10-CM | POA: Diagnosis not present

## 2023-05-19 LAB — RAD ONC ARIA SESSION SUMMARY
Course Elapsed Days: 8
Plan Fractions Treated to Date: 6
Plan Prescribed Dose Per Fraction: 2.5 Gy
Plan Total Fractions Prescribed: 28
Plan Total Prescribed Dose: 70 Gy
Reference Point Dosage Given to Date: 15 Gy
Reference Point Session Dosage Given: 2.5 Gy
Session Number: 6

## 2023-05-20 ENCOUNTER — Ambulatory Visit
Admission: RE | Admit: 2023-05-20 | Discharge: 2023-05-20 | Disposition: A | Discharge status: Home / Self Care | Payer: Medicare PPO | Source: Ambulatory Visit | Attending: Radiation Oncology | Admitting: Radiation Oncology

## 2023-05-20 ENCOUNTER — Other Ambulatory Visit: Payer: Self-pay

## 2023-05-20 DIAGNOSIS — R300 Dysuria: Secondary | ICD-10-CM | POA: Diagnosis not present

## 2023-05-20 DIAGNOSIS — C61 Malignant neoplasm of prostate: Secondary | ICD-10-CM | POA: Diagnosis not present

## 2023-05-20 DIAGNOSIS — E785 Hyperlipidemia, unspecified: Secondary | ICD-10-CM | POA: Diagnosis not present

## 2023-05-20 DIAGNOSIS — Z7952 Long term (current) use of systemic steroids: Secondary | ICD-10-CM | POA: Diagnosis not present

## 2023-05-20 DIAGNOSIS — I10 Essential (primary) hypertension: Secondary | ICD-10-CM | POA: Diagnosis not present

## 2023-05-20 DIAGNOSIS — I4891 Unspecified atrial fibrillation: Secondary | ICD-10-CM | POA: Diagnosis not present

## 2023-05-20 DIAGNOSIS — R351 Nocturia: Secondary | ICD-10-CM | POA: Diagnosis not present

## 2023-05-20 DIAGNOSIS — Z510 Encounter for antineoplastic radiation therapy: Secondary | ICD-10-CM | POA: Diagnosis not present

## 2023-05-20 DIAGNOSIS — Z191 Hormone sensitive malignancy status: Secondary | ICD-10-CM | POA: Diagnosis not present

## 2023-05-20 DIAGNOSIS — E119 Type 2 diabetes mellitus without complications: Secondary | ICD-10-CM | POA: Diagnosis not present

## 2023-05-20 DIAGNOSIS — K5900 Constipation, unspecified: Secondary | ICD-10-CM | POA: Diagnosis not present

## 2023-05-20 LAB — RAD ONC ARIA SESSION SUMMARY
Course Elapsed Days: 9
Plan Fractions Treated to Date: 7
Plan Prescribed Dose Per Fraction: 2.5 Gy
Plan Total Fractions Prescribed: 28
Plan Total Prescribed Dose: 70 Gy
Reference Point Dosage Given to Date: 17.5 Gy
Reference Point Session Dosage Given: 2.5 Gy
Session Number: 7

## 2023-05-21 ENCOUNTER — Other Ambulatory Visit: Payer: Self-pay

## 2023-05-21 ENCOUNTER — Ambulatory Visit
Admission: RE | Admit: 2023-05-21 | Discharge: 2023-05-21 | Disposition: A | Discharge status: Home / Self Care | Payer: Medicare PPO | Source: Ambulatory Visit | Attending: Radiation Oncology | Admitting: Radiation Oncology

## 2023-05-21 DIAGNOSIS — I10 Essential (primary) hypertension: Secondary | ICD-10-CM | POA: Diagnosis not present

## 2023-05-21 DIAGNOSIS — C61 Malignant neoplasm of prostate: Secondary | ICD-10-CM | POA: Diagnosis not present

## 2023-05-21 DIAGNOSIS — E119 Type 2 diabetes mellitus without complications: Secondary | ICD-10-CM | POA: Diagnosis not present

## 2023-05-21 DIAGNOSIS — Z191 Hormone sensitive malignancy status: Secondary | ICD-10-CM | POA: Diagnosis not present

## 2023-05-21 DIAGNOSIS — R351 Nocturia: Secondary | ICD-10-CM | POA: Diagnosis not present

## 2023-05-21 DIAGNOSIS — I4891 Unspecified atrial fibrillation: Secondary | ICD-10-CM | POA: Diagnosis not present

## 2023-05-21 DIAGNOSIS — Z7952 Long term (current) use of systemic steroids: Secondary | ICD-10-CM | POA: Diagnosis not present

## 2023-05-21 DIAGNOSIS — E785 Hyperlipidemia, unspecified: Secondary | ICD-10-CM | POA: Diagnosis not present

## 2023-05-21 DIAGNOSIS — R300 Dysuria: Secondary | ICD-10-CM | POA: Diagnosis not present

## 2023-05-21 DIAGNOSIS — K5900 Constipation, unspecified: Secondary | ICD-10-CM | POA: Diagnosis not present

## 2023-05-21 DIAGNOSIS — Z510 Encounter for antineoplastic radiation therapy: Secondary | ICD-10-CM | POA: Diagnosis not present

## 2023-05-21 LAB — RAD ONC ARIA SESSION SUMMARY
Course Elapsed Days: 10
Plan Fractions Treated to Date: 8
Plan Prescribed Dose Per Fraction: 2.5 Gy
Plan Total Fractions Prescribed: 28
Plan Total Prescribed Dose: 70 Gy
Reference Point Dosage Given to Date: 20 Gy
Reference Point Session Dosage Given: 2.5 Gy
Session Number: 8

## 2023-05-24 ENCOUNTER — Ambulatory Visit
Admission: RE | Admit: 2023-05-24 | Discharge: 2023-05-24 | Disposition: A | Discharge status: Home / Self Care | Payer: Medicare PPO | Source: Ambulatory Visit | Attending: Radiation Oncology | Admitting: Radiation Oncology

## 2023-05-24 ENCOUNTER — Other Ambulatory Visit: Payer: Self-pay

## 2023-05-24 DIAGNOSIS — Z191 Hormone sensitive malignancy status: Secondary | ICD-10-CM | POA: Diagnosis not present

## 2023-05-24 DIAGNOSIS — E785 Hyperlipidemia, unspecified: Secondary | ICD-10-CM | POA: Diagnosis not present

## 2023-05-24 DIAGNOSIS — R351 Nocturia: Secondary | ICD-10-CM | POA: Diagnosis not present

## 2023-05-24 DIAGNOSIS — R300 Dysuria: Secondary | ICD-10-CM | POA: Diagnosis not present

## 2023-05-24 DIAGNOSIS — I10 Essential (primary) hypertension: Secondary | ICD-10-CM | POA: Diagnosis not present

## 2023-05-24 DIAGNOSIS — Z510 Encounter for antineoplastic radiation therapy: Secondary | ICD-10-CM | POA: Diagnosis not present

## 2023-05-24 DIAGNOSIS — C61 Malignant neoplasm of prostate: Secondary | ICD-10-CM | POA: Diagnosis not present

## 2023-05-24 DIAGNOSIS — E119 Type 2 diabetes mellitus without complications: Secondary | ICD-10-CM | POA: Diagnosis not present

## 2023-05-24 DIAGNOSIS — K5900 Constipation, unspecified: Secondary | ICD-10-CM | POA: Diagnosis not present

## 2023-05-24 DIAGNOSIS — Z7952 Long term (current) use of systemic steroids: Secondary | ICD-10-CM | POA: Diagnosis not present

## 2023-05-24 DIAGNOSIS — I4891 Unspecified atrial fibrillation: Secondary | ICD-10-CM | POA: Diagnosis not present

## 2023-05-24 LAB — RAD ONC ARIA SESSION SUMMARY
Course Elapsed Days: 13
Plan Fractions Treated to Date: 9
Plan Prescribed Dose Per Fraction: 2.5 Gy
Plan Total Fractions Prescribed: 28
Plan Total Prescribed Dose: 70 Gy
Reference Point Dosage Given to Date: 22.5 Gy
Reference Point Session Dosage Given: 2.5 Gy
Session Number: 9

## 2023-05-25 ENCOUNTER — Ambulatory Visit
Admission: RE | Admit: 2023-05-25 | Discharge: 2023-05-25 | Disposition: A | Discharge status: Home / Self Care | Payer: Medicare PPO | Source: Ambulatory Visit | Attending: Radiation Oncology | Admitting: Radiation Oncology

## 2023-05-25 ENCOUNTER — Other Ambulatory Visit: Payer: Self-pay

## 2023-05-25 DIAGNOSIS — R300 Dysuria: Secondary | ICD-10-CM | POA: Diagnosis not present

## 2023-05-25 DIAGNOSIS — Z191 Hormone sensitive malignancy status: Secondary | ICD-10-CM | POA: Diagnosis not present

## 2023-05-25 DIAGNOSIS — I10 Essential (primary) hypertension: Secondary | ICD-10-CM | POA: Diagnosis not present

## 2023-05-25 DIAGNOSIS — Z510 Encounter for antineoplastic radiation therapy: Secondary | ICD-10-CM | POA: Diagnosis not present

## 2023-05-25 DIAGNOSIS — C61 Malignant neoplasm of prostate: Secondary | ICD-10-CM | POA: Diagnosis not present

## 2023-05-25 DIAGNOSIS — K5900 Constipation, unspecified: Secondary | ICD-10-CM | POA: Diagnosis not present

## 2023-05-25 DIAGNOSIS — E785 Hyperlipidemia, unspecified: Secondary | ICD-10-CM | POA: Diagnosis not present

## 2023-05-25 DIAGNOSIS — E119 Type 2 diabetes mellitus without complications: Secondary | ICD-10-CM | POA: Diagnosis not present

## 2023-05-25 DIAGNOSIS — Z7952 Long term (current) use of systemic steroids: Secondary | ICD-10-CM | POA: Diagnosis not present

## 2023-05-25 DIAGNOSIS — R351 Nocturia: Secondary | ICD-10-CM | POA: Diagnosis not present

## 2023-05-25 DIAGNOSIS — I4891 Unspecified atrial fibrillation: Secondary | ICD-10-CM | POA: Diagnosis not present

## 2023-05-25 LAB — RAD ONC ARIA SESSION SUMMARY
Course Elapsed Days: 14
Plan Fractions Treated to Date: 10
Plan Prescribed Dose Per Fraction: 2.5 Gy
Plan Total Fractions Prescribed: 28
Plan Total Prescribed Dose: 70 Gy
Reference Point Dosage Given to Date: 25 Gy
Reference Point Session Dosage Given: 2.5 Gy
Session Number: 10

## 2023-05-26 ENCOUNTER — Ambulatory Visit
Admission: RE | Admit: 2023-05-26 | Discharge: 2023-05-26 | Disposition: A | Discharge status: Home / Self Care | Payer: Medicare PPO | Source: Ambulatory Visit | Attending: Radiation Oncology | Admitting: Radiation Oncology

## 2023-05-26 ENCOUNTER — Other Ambulatory Visit: Payer: Self-pay

## 2023-05-26 DIAGNOSIS — I4891 Unspecified atrial fibrillation: Secondary | ICD-10-CM | POA: Diagnosis not present

## 2023-05-26 DIAGNOSIS — R300 Dysuria: Secondary | ICD-10-CM | POA: Diagnosis not present

## 2023-05-26 DIAGNOSIS — E119 Type 2 diabetes mellitus without complications: Secondary | ICD-10-CM | POA: Diagnosis not present

## 2023-05-26 DIAGNOSIS — C61 Malignant neoplasm of prostate: Secondary | ICD-10-CM | POA: Diagnosis not present

## 2023-05-26 DIAGNOSIS — Z510 Encounter for antineoplastic radiation therapy: Secondary | ICD-10-CM | POA: Diagnosis not present

## 2023-05-26 DIAGNOSIS — Z191 Hormone sensitive malignancy status: Secondary | ICD-10-CM | POA: Diagnosis not present

## 2023-05-26 DIAGNOSIS — Z7952 Long term (current) use of systemic steroids: Secondary | ICD-10-CM | POA: Diagnosis not present

## 2023-05-26 DIAGNOSIS — E785 Hyperlipidemia, unspecified: Secondary | ICD-10-CM | POA: Diagnosis not present

## 2023-05-26 DIAGNOSIS — I10 Essential (primary) hypertension: Secondary | ICD-10-CM | POA: Diagnosis not present

## 2023-05-26 DIAGNOSIS — R351 Nocturia: Secondary | ICD-10-CM | POA: Diagnosis not present

## 2023-05-26 DIAGNOSIS — K5900 Constipation, unspecified: Secondary | ICD-10-CM | POA: Diagnosis not present

## 2023-05-26 LAB — RAD ONC ARIA SESSION SUMMARY
Course Elapsed Days: 15
Plan Fractions Treated to Date: 11
Plan Prescribed Dose Per Fraction: 2.5 Gy
Plan Total Fractions Prescribed: 28
Plan Total Prescribed Dose: 70 Gy
Reference Point Dosage Given to Date: 27.5 Gy
Reference Point Session Dosage Given: 2.5 Gy
Session Number: 11

## 2023-05-27 ENCOUNTER — Ambulatory Visit
Admission: RE | Admit: 2023-05-27 | Discharge: 2023-05-27 | Disposition: A | Discharge status: Home / Self Care | Payer: Medicare PPO | Source: Ambulatory Visit | Attending: Radiation Oncology | Admitting: Radiation Oncology

## 2023-05-27 ENCOUNTER — Other Ambulatory Visit: Payer: Self-pay

## 2023-05-27 DIAGNOSIS — I10 Essential (primary) hypertension: Secondary | ICD-10-CM | POA: Diagnosis not present

## 2023-05-27 DIAGNOSIS — C61 Malignant neoplasm of prostate: Secondary | ICD-10-CM | POA: Diagnosis not present

## 2023-05-27 DIAGNOSIS — R351 Nocturia: Secondary | ICD-10-CM | POA: Diagnosis not present

## 2023-05-27 DIAGNOSIS — R300 Dysuria: Secondary | ICD-10-CM | POA: Diagnosis not present

## 2023-05-27 DIAGNOSIS — E785 Hyperlipidemia, unspecified: Secondary | ICD-10-CM | POA: Diagnosis not present

## 2023-05-27 DIAGNOSIS — I4891 Unspecified atrial fibrillation: Secondary | ICD-10-CM | POA: Diagnosis not present

## 2023-05-27 DIAGNOSIS — Z191 Hormone sensitive malignancy status: Secondary | ICD-10-CM | POA: Diagnosis not present

## 2023-05-27 DIAGNOSIS — K5900 Constipation, unspecified: Secondary | ICD-10-CM | POA: Diagnosis not present

## 2023-05-27 DIAGNOSIS — E119 Type 2 diabetes mellitus without complications: Secondary | ICD-10-CM | POA: Diagnosis not present

## 2023-05-27 DIAGNOSIS — Z7952 Long term (current) use of systemic steroids: Secondary | ICD-10-CM | POA: Diagnosis not present

## 2023-05-27 DIAGNOSIS — Z510 Encounter for antineoplastic radiation therapy: Secondary | ICD-10-CM | POA: Diagnosis not present

## 2023-05-27 LAB — RAD ONC ARIA SESSION SUMMARY
Course Elapsed Days: 16
Plan Fractions Treated to Date: 12
Plan Prescribed Dose Per Fraction: 2.5 Gy
Plan Total Fractions Prescribed: 28
Plan Total Prescribed Dose: 70 Gy
Reference Point Dosage Given to Date: 30 Gy
Reference Point Session Dosage Given: 2.5 Gy
Session Number: 12

## 2023-05-28 ENCOUNTER — Other Ambulatory Visit (HOSPITAL_COMMUNITY): Payer: Self-pay

## 2023-05-28 ENCOUNTER — Other Ambulatory Visit: Payer: Self-pay

## 2023-05-28 ENCOUNTER — Other Ambulatory Visit: Payer: Self-pay | Admitting: Rheumatology

## 2023-05-28 ENCOUNTER — Ambulatory Visit
Admission: RE | Admit: 2023-05-28 | Discharge: 2023-05-28 | Disposition: A | Discharge status: Home / Self Care | Payer: Medicare PPO | Source: Ambulatory Visit | Attending: Radiation Oncology | Admitting: Radiation Oncology

## 2023-05-28 DIAGNOSIS — Z79899 Other long term (current) drug therapy: Secondary | ICD-10-CM

## 2023-05-28 DIAGNOSIS — K5900 Constipation, unspecified: Secondary | ICD-10-CM | POA: Diagnosis not present

## 2023-05-28 DIAGNOSIS — R351 Nocturia: Secondary | ICD-10-CM | POA: Diagnosis not present

## 2023-05-28 DIAGNOSIS — E119 Type 2 diabetes mellitus without complications: Secondary | ICD-10-CM | POA: Diagnosis not present

## 2023-05-28 DIAGNOSIS — I4891 Unspecified atrial fibrillation: Secondary | ICD-10-CM | POA: Diagnosis not present

## 2023-05-28 DIAGNOSIS — E785 Hyperlipidemia, unspecified: Secondary | ICD-10-CM | POA: Diagnosis not present

## 2023-05-28 DIAGNOSIS — M0579 Rheumatoid arthritis with rheumatoid factor of multiple sites without organ or systems involvement: Secondary | ICD-10-CM

## 2023-05-28 DIAGNOSIS — Z191 Hormone sensitive malignancy status: Secondary | ICD-10-CM | POA: Diagnosis not present

## 2023-05-28 DIAGNOSIS — Z7952 Long term (current) use of systemic steroids: Secondary | ICD-10-CM | POA: Diagnosis not present

## 2023-05-28 DIAGNOSIS — R300 Dysuria: Secondary | ICD-10-CM | POA: Diagnosis not present

## 2023-05-28 DIAGNOSIS — C61 Malignant neoplasm of prostate: Secondary | ICD-10-CM | POA: Diagnosis not present

## 2023-05-28 DIAGNOSIS — I10 Essential (primary) hypertension: Secondary | ICD-10-CM | POA: Diagnosis not present

## 2023-05-28 DIAGNOSIS — Z510 Encounter for antineoplastic radiation therapy: Secondary | ICD-10-CM | POA: Diagnosis not present

## 2023-05-28 LAB — RAD ONC ARIA SESSION SUMMARY
Course Elapsed Days: 17
Plan Fractions Treated to Date: 13
Plan Prescribed Dose Per Fraction: 2.5 Gy
Plan Total Fractions Prescribed: 28
Plan Total Prescribed Dose: 70 Gy
Reference Point Dosage Given to Date: 32.5 Gy
Reference Point Session Dosage Given: 2.5 Gy
Session Number: 13

## 2023-05-28 NOTE — Progress Notes (Signed)
Specialty Pharmacy Refill Coordination Note  Dam Brueggeman. is a 72 y.o. male contacted today regarding refills of specialty medication(s) Abatacept   Patient requested Delivery   Delivery date: 06/10/23   Verified address: [address]  [place] [address]   Medication will be filled on 06/09/23 pending refill request.

## 2023-05-28 NOTE — Progress Notes (Signed)
Refills denied - patient needs appointment. Patient aware.

## 2023-05-31 ENCOUNTER — Inpatient Hospital Stay: Payer: Medicare PPO

## 2023-05-31 ENCOUNTER — Ambulatory Visit
Admission: RE | Admit: 2023-05-31 | Discharge: 2023-05-31 | Disposition: A | Discharge status: Home / Self Care | Payer: Medicare PPO | Source: Ambulatory Visit | Attending: Radiation Oncology | Admitting: Radiation Oncology

## 2023-05-31 ENCOUNTER — Other Ambulatory Visit: Payer: Self-pay

## 2023-05-31 DIAGNOSIS — R300 Dysuria: Secondary | ICD-10-CM | POA: Diagnosis not present

## 2023-05-31 DIAGNOSIS — C61 Malignant neoplasm of prostate: Secondary | ICD-10-CM | POA: Diagnosis not present

## 2023-05-31 DIAGNOSIS — Z191 Hormone sensitive malignancy status: Secondary | ICD-10-CM | POA: Diagnosis not present

## 2023-05-31 DIAGNOSIS — K5900 Constipation, unspecified: Secondary | ICD-10-CM | POA: Diagnosis not present

## 2023-05-31 DIAGNOSIS — E119 Type 2 diabetes mellitus without complications: Secondary | ICD-10-CM | POA: Diagnosis not present

## 2023-05-31 DIAGNOSIS — Z510 Encounter for antineoplastic radiation therapy: Secondary | ICD-10-CM | POA: Diagnosis not present

## 2023-05-31 DIAGNOSIS — I10 Essential (primary) hypertension: Secondary | ICD-10-CM | POA: Diagnosis not present

## 2023-05-31 DIAGNOSIS — E785 Hyperlipidemia, unspecified: Secondary | ICD-10-CM | POA: Diagnosis not present

## 2023-05-31 DIAGNOSIS — R351 Nocturia: Secondary | ICD-10-CM | POA: Diagnosis not present

## 2023-05-31 DIAGNOSIS — Z7952 Long term (current) use of systemic steroids: Secondary | ICD-10-CM | POA: Diagnosis not present

## 2023-05-31 DIAGNOSIS — I4891 Unspecified atrial fibrillation: Secondary | ICD-10-CM | POA: Diagnosis not present

## 2023-05-31 LAB — RAD ONC ARIA SESSION SUMMARY
Course Elapsed Days: 20
Plan Fractions Treated to Date: 14
Plan Prescribed Dose Per Fraction: 2.5 Gy
Plan Total Fractions Prescribed: 28
Plan Total Prescribed Dose: 70 Gy
Reference Point Dosage Given to Date: 35 Gy
Reference Point Session Dosage Given: 2.5 Gy
Session Number: 14

## 2023-05-31 LAB — CBC (CANCER CENTER ONLY)
HCT: 32.1 % — ABNORMAL LOW (ref 39.0–52.0)
Hemoglobin: 10.2 g/dL — ABNORMAL LOW (ref 13.0–17.0)
MCH: 27.6 pg (ref 26.0–34.0)
MCHC: 31.8 g/dL (ref 30.0–36.0)
MCV: 86.8 fL (ref 80.0–100.0)
Platelet Count: 169 10*3/uL (ref 150–400)
RBC: 3.7 MIL/uL — ABNORMAL LOW (ref 4.22–5.81)
RDW: 13.2 % (ref 11.5–15.5)
WBC Count: 3.5 10*3/uL — ABNORMAL LOW (ref 4.0–10.5)
nRBC: 0 % (ref 0.0–0.2)

## 2023-05-31 NOTE — Progress Notes (Deleted)
Office Visit Note  Patient: Michael Hinton.             Date of Birth: 02-Sep-1950           MRN: [id number]             PCP: Orson Eva, NP Referring: Orson Eva, NP Visit Date: 06/07/2023 Occupation: @GUAROCC @  Subjective:  No chief complaint on file.   History of Present Illness: Michael Hinton. is a 72 y.o. male ***     Activities of Daily Living:  Patient reports morning stiffness for *** {minute/hour:19697}.   Patient {ACTIONS;DENIES/REPORTS:21021675::"Denies"} nocturnal pain.  Difficulty dressing/grooming: {ACTIONS;DENIES/REPORTS:21021675::"Denies"} Difficulty climbing stairs: {ACTIONS;DENIES/REPORTS:21021675::"Denies"} Difficulty getting out of chair: {ACTIONS;DENIES/REPORTS:21021675::"Denies"} Difficulty using hands for taps, buttons, cutlery, and/or writing: {ACTIONS;DENIES/REPORTS:21021675::"Denies"}  No Rheumatology ROS completed.   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   Weakness    AKI (acute kidney injury) (HCC)    Normocytic anemia    Dysphagia    Unintentional weight loss    Hypercalcemia    Renal mass 04/10/2022   Vitamin B12 deficiency 11/04/2021   DDD (degenerative disc disease), lumbar 03/11/2018   ANA positive 01/22/2017   Leucopenia 01/22/2017   Contracture of elbow 08/23/2016   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   Rheumatoid arthritis involving multiple sites with positive rheumatoid factor (HCC) 06/02/2016   High risk medication use 06/02/2016   Hypertension 06/02/2016   Vitamin D deficiency 06/02/2016    Past Medical History:  Diagnosis Date   Anemia    Aortic atherosclerosis (HCC)    Atrial fibrillation (HCC)    a.) CHA2DS2VASc = 5 (age, HTN, CVA x 2, vascular disease history);  b.) rate/rhythm maintained without pharmacological intervention; chronically  anticoagulated with clopidogrel   Bilateral renal cysts 05/29/2022   Carotid artery disease (HCC) 04/11/2022   a.) doppler 04/11/2022: <50 LICA, no sig RICA.   CVA (cerebral vascular accident) [facility])    a.) MRI brain 01/14/2022: numerous chronic cerebellar infarcts; b.) CT head 04/10/2022: small old lacunar infarcts are seen in cerebellum bilaterally   Diastolic dysfunction 04/11/2022   a.) TTE 04/11/2022: EF 55-60%, mild BAE, mild MR, G1DD.   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   T2DM (type 2 diabetes mellitus) (HCC)    Thoracic ascending aortic aneurysm (HCC) 06/01/2022   a.) CT chest - measured 4.2 cm    Family History  Problem Relation Age of Onset   Heart disease Mother    Diabetes Mother    Breast cancer Mother    Kidney disease Father    Diabetes Father    Diabetes Daughter    Past Surgical History:  Procedure Laterality Date   LAPAROSCOPIC NEPHRECTOMY, HAND ASSISTED Left 07/13/2022  Procedure: HAND ASSISTED LAPAROSCOPIC RADICAL NEPHRECTOMY;  Surgeon: Vanna Scotland, MD;  Location: [facility] ORS;  Service: Urology;  Laterality: Left;   RENAL BIOPSY  05/2022   Social History   Social History Narrative   Not on file   Immunization History  Administered Date(s) Administered   Moderna Sars-Covid-2 Vaccination 10/12/2019, 11/15/2019   PPD Test 03/20/2022     Objective: Vital Signs: There were no vitals  taken for this visit.   Physical Exam   Musculoskeletal Exam: ***  CDAI Exam: CDAI Score: -- Patient Global: --; Provider Global: -- Swollen: --; Tender: -- Joint Exam 06/07/2023   No joint exam has been documented for this visit   There is currently no information documented on the homunculus. Go to the Rheumatology activity and complete the homunculus joint exam.  Investigation: No additional findings.  Imaging: No results found.  Recent Labs: Lab Results  Component Value Date   WBC 2.7 (L) 01/25/2023   HGB 10.3 (L) 01/25/2023   PLT 225 01/25/2023   NA 139 01/25/2023   K 4.0 01/25/2023   CL 109 01/25/2023   CO2 19 (L) 01/25/2023   GLUCOSE 77 01/25/2023   BUN 31 (H) 01/25/2023   CREATININE 1.85 (H) 01/25/2023   BILITOT 0.5 01/25/2023   ALKPHOS 94 04/10/2022   AST 15 01/25/2023   ALT 11 01/25/2023   PROT 7.1 01/25/2023   ALBUMIN 3.3 (L) 04/10/2022   CALCIUM 9.8 01/25/2023   GFRAA 86 02/03/2021   QFTBGOLD NEGATIVE 06/16/2017   QFTBGOLDPLUS INDETERMINATE (A) 03/06/2022    Speciality Comments: TB Skin Test 03/23/2022: Negative  Procedures:  No procedures performed Allergies: Patient has no known allergies.   Assessment / Plan:     Visit Diagnoses: No diagnosis found.  Orders: No orders of the defined types were placed in this encounter.  No orders of the defined types were placed in this encounter.   Face-to-face time spent with patient was *** minutes. Greater than 50% of time was spent in counseling and coordination of care.  Follow-Up Instructions: No follow-ups on file.   Ellen Henri, CMA  Note - This record has been created using Animal nutritionist.  Chart creation errors have been sought, but may not always  have been located. Such creation errors do not reflect on  the standard of medical care.

## 2023-06-01 ENCOUNTER — Ambulatory Visit
Admission: RE | Admit: 2023-06-01 | Discharge: 2023-06-01 | Disposition: A | Discharge status: Home / Self Care | Payer: Medicare PPO | Source: Ambulatory Visit | Attending: Radiation Oncology | Admitting: Radiation Oncology

## 2023-06-01 ENCOUNTER — Other Ambulatory Visit: Payer: Self-pay

## 2023-06-01 DIAGNOSIS — R351 Nocturia: Secondary | ICD-10-CM | POA: Diagnosis not present

## 2023-06-01 DIAGNOSIS — Z510 Encounter for antineoplastic radiation therapy: Secondary | ICD-10-CM | POA: Diagnosis not present

## 2023-06-01 DIAGNOSIS — C61 Malignant neoplasm of prostate: Secondary | ICD-10-CM | POA: Diagnosis not present

## 2023-06-01 DIAGNOSIS — Z7952 Long term (current) use of systemic steroids: Secondary | ICD-10-CM | POA: Diagnosis not present

## 2023-06-01 DIAGNOSIS — R300 Dysuria: Secondary | ICD-10-CM | POA: Diagnosis not present

## 2023-06-01 DIAGNOSIS — Z191 Hormone sensitive malignancy status: Secondary | ICD-10-CM | POA: Diagnosis not present

## 2023-06-01 DIAGNOSIS — K5900 Constipation, unspecified: Secondary | ICD-10-CM | POA: Diagnosis not present

## 2023-06-01 DIAGNOSIS — I10 Essential (primary) hypertension: Secondary | ICD-10-CM

## 2023-06-01 DIAGNOSIS — E785 Hyperlipidemia, unspecified: Secondary | ICD-10-CM | POA: Diagnosis not present

## 2023-06-01 DIAGNOSIS — E119 Type 2 diabetes mellitus without complications: Secondary | ICD-10-CM | POA: Diagnosis not present

## 2023-06-01 DIAGNOSIS — I4891 Unspecified atrial fibrillation: Secondary | ICD-10-CM | POA: Diagnosis not present

## 2023-06-01 LAB — RAD ONC ARIA SESSION SUMMARY
Course Elapsed Days: 21
Plan Fractions Treated to Date: 15
Plan Prescribed Dose Per Fraction: 2.5 Gy
Plan Total Fractions Prescribed: 28
Plan Total Prescribed Dose: 70 Gy
Reference Point Dosage Given to Date: 37.5 Gy
Reference Point Session Dosage Given: 2.5 Gy
Session Number: 15

## 2023-06-01 MED ORDER — LOSARTAN POTASSIUM 50 MG PO TABS: 50.0000 mg | ORAL_TABLET | Freq: Every day | ORAL | 1 refills | Status: DC

## 2023-06-02 ENCOUNTER — Ambulatory Visit
Admission: RE | Admit: 2023-06-02 | Discharge: 2023-06-02 | Disposition: A | Discharge status: Home / Self Care | Payer: Medicare PPO | Source: Ambulatory Visit | Attending: Radiation Oncology | Admitting: Radiation Oncology

## 2023-06-02 ENCOUNTER — Other Ambulatory Visit: Payer: Self-pay

## 2023-06-02 DIAGNOSIS — I10 Essential (primary) hypertension: Secondary | ICD-10-CM | POA: Diagnosis not present

## 2023-06-02 DIAGNOSIS — R351 Nocturia: Secondary | ICD-10-CM | POA: Diagnosis not present

## 2023-06-02 DIAGNOSIS — C61 Malignant neoplasm of prostate: Secondary | ICD-10-CM | POA: Diagnosis not present

## 2023-06-02 DIAGNOSIS — R300 Dysuria: Secondary | ICD-10-CM | POA: Diagnosis not present

## 2023-06-02 DIAGNOSIS — Z191 Hormone sensitive malignancy status: Secondary | ICD-10-CM | POA: Diagnosis not present

## 2023-06-02 DIAGNOSIS — K5900 Constipation, unspecified: Secondary | ICD-10-CM | POA: Diagnosis not present

## 2023-06-02 DIAGNOSIS — Z7952 Long term (current) use of systemic steroids: Secondary | ICD-10-CM | POA: Diagnosis not present

## 2023-06-02 DIAGNOSIS — E785 Hyperlipidemia, unspecified: Secondary | ICD-10-CM | POA: Diagnosis not present

## 2023-06-02 DIAGNOSIS — I4891 Unspecified atrial fibrillation: Secondary | ICD-10-CM | POA: Diagnosis not present

## 2023-06-02 DIAGNOSIS — E119 Type 2 diabetes mellitus without complications: Secondary | ICD-10-CM | POA: Diagnosis not present

## 2023-06-02 DIAGNOSIS — Z510 Encounter for antineoplastic radiation therapy: Secondary | ICD-10-CM | POA: Diagnosis not present

## 2023-06-02 LAB — RAD ONC ARIA SESSION SUMMARY
Course Elapsed Days: 22
Plan Fractions Treated to Date: 16
Plan Prescribed Dose Per Fraction: 2.5 Gy
Plan Total Fractions Prescribed: 28
Plan Total Prescribed Dose: 70 Gy
Reference Point Dosage Given to Date: 40 Gy
Reference Point Session Dosage Given: 2.5 Gy
Session Number: 16

## 2023-06-03 ENCOUNTER — Other Ambulatory Visit: Payer: Self-pay

## 2023-06-03 ENCOUNTER — Ambulatory Visit
Admission: RE | Admit: 2023-06-03 | Discharge: 2023-06-03 | Disposition: A | Discharge status: Home / Self Care | Payer: Medicare PPO | Source: Ambulatory Visit | Attending: Radiation Oncology | Admitting: Radiation Oncology

## 2023-06-03 DIAGNOSIS — K5900 Constipation, unspecified: Secondary | ICD-10-CM | POA: Diagnosis not present

## 2023-06-03 DIAGNOSIS — I10 Essential (primary) hypertension: Secondary | ICD-10-CM | POA: Diagnosis not present

## 2023-06-03 DIAGNOSIS — R300 Dysuria: Secondary | ICD-10-CM | POA: Diagnosis not present

## 2023-06-03 DIAGNOSIS — Z7952 Long term (current) use of systemic steroids: Secondary | ICD-10-CM | POA: Diagnosis not present

## 2023-06-03 DIAGNOSIS — E119 Type 2 diabetes mellitus without complications: Secondary | ICD-10-CM | POA: Diagnosis not present

## 2023-06-03 DIAGNOSIS — Z510 Encounter for antineoplastic radiation therapy: Secondary | ICD-10-CM | POA: Diagnosis not present

## 2023-06-03 DIAGNOSIS — I4891 Unspecified atrial fibrillation: Secondary | ICD-10-CM | POA: Diagnosis not present

## 2023-06-03 DIAGNOSIS — Z191 Hormone sensitive malignancy status: Secondary | ICD-10-CM | POA: Diagnosis not present

## 2023-06-03 DIAGNOSIS — C61 Malignant neoplasm of prostate: Secondary | ICD-10-CM | POA: Diagnosis not present

## 2023-06-03 DIAGNOSIS — R351 Nocturia: Secondary | ICD-10-CM | POA: Diagnosis not present

## 2023-06-03 DIAGNOSIS — E785 Hyperlipidemia, unspecified: Secondary | ICD-10-CM | POA: Diagnosis not present

## 2023-06-03 LAB — RAD ONC ARIA SESSION SUMMARY
Course Elapsed Days: 23
Plan Fractions Treated to Date: 17
Plan Prescribed Dose Per Fraction: 2.5 Gy
Plan Total Fractions Prescribed: 28
Plan Total Prescribed Dose: 70 Gy
Reference Point Dosage Given to Date: 42.5 Gy
Reference Point Session Dosage Given: 2.5 Gy
Session Number: 17

## 2023-06-04 ENCOUNTER — Ambulatory Visit
Admission: RE | Admit: 2023-06-04 | Discharge: 2023-06-04 | Disposition: A | Discharge status: Home / Self Care | Payer: Medicare PPO | Source: Ambulatory Visit | Attending: Radiation Oncology | Admitting: Radiation Oncology

## 2023-06-04 ENCOUNTER — Other Ambulatory Visit: Payer: Self-pay

## 2023-06-04 DIAGNOSIS — K5900 Constipation, unspecified: Secondary | ICD-10-CM | POA: Diagnosis not present

## 2023-06-04 DIAGNOSIS — C61 Malignant neoplasm of prostate: Secondary | ICD-10-CM | POA: Diagnosis not present

## 2023-06-04 DIAGNOSIS — E119 Type 2 diabetes mellitus without complications: Secondary | ICD-10-CM | POA: Diagnosis not present

## 2023-06-04 DIAGNOSIS — R300 Dysuria: Secondary | ICD-10-CM | POA: Diagnosis not present

## 2023-06-04 DIAGNOSIS — E785 Hyperlipidemia, unspecified: Secondary | ICD-10-CM | POA: Diagnosis not present

## 2023-06-04 DIAGNOSIS — R351 Nocturia: Secondary | ICD-10-CM | POA: Diagnosis not present

## 2023-06-04 DIAGNOSIS — Z510 Encounter for antineoplastic radiation therapy: Secondary | ICD-10-CM | POA: Diagnosis not present

## 2023-06-04 DIAGNOSIS — I4891 Unspecified atrial fibrillation: Secondary | ICD-10-CM | POA: Diagnosis not present

## 2023-06-04 DIAGNOSIS — Z191 Hormone sensitive malignancy status: Secondary | ICD-10-CM | POA: Diagnosis not present

## 2023-06-04 DIAGNOSIS — Z7952 Long term (current) use of systemic steroids: Secondary | ICD-10-CM | POA: Diagnosis not present

## 2023-06-04 DIAGNOSIS — I10 Essential (primary) hypertension: Secondary | ICD-10-CM | POA: Diagnosis not present

## 2023-06-04 LAB — RAD ONC ARIA SESSION SUMMARY
Course Elapsed Days: 24
Plan Fractions Treated to Date: 18
Plan Prescribed Dose Per Fraction: 2.5 Gy
Plan Total Fractions Prescribed: 28
Plan Total Prescribed Dose: 70 Gy
Reference Point Dosage Given to Date: 45 Gy
Reference Point Session Dosage Given: 2.5 Gy
Session Number: 18

## 2023-06-07 ENCOUNTER — Ambulatory Visit
Admission: RE | Admit: 2023-06-07 | Discharge: 2023-06-07 | Disposition: A | Discharge status: Home / Self Care | Payer: Medicare PPO | Source: Ambulatory Visit | Attending: Radiation Oncology | Admitting: Radiation Oncology

## 2023-06-07 ENCOUNTER — Other Ambulatory Visit: Payer: Self-pay

## 2023-06-07 ENCOUNTER — Ambulatory Visit: Payer: Medicare PPO | Admitting: Physician Assistant

## 2023-06-07 DIAGNOSIS — E119 Type 2 diabetes mellitus without complications: Secondary | ICD-10-CM | POA: Diagnosis not present

## 2023-06-07 DIAGNOSIS — E785 Hyperlipidemia, unspecified: Secondary | ICD-10-CM | POA: Diagnosis not present

## 2023-06-07 DIAGNOSIS — Z191 Hormone sensitive malignancy status: Secondary | ICD-10-CM | POA: Diagnosis not present

## 2023-06-07 DIAGNOSIS — M24521 Contracture, right elbow: Secondary | ICD-10-CM

## 2023-06-07 DIAGNOSIS — I10 Essential (primary) hypertension: Secondary | ICD-10-CM | POA: Diagnosis not present

## 2023-06-07 DIAGNOSIS — E559 Vitamin D deficiency, unspecified: Secondary | ICD-10-CM

## 2023-06-07 DIAGNOSIS — I4891 Unspecified atrial fibrillation: Secondary | ICD-10-CM | POA: Diagnosis not present

## 2023-06-07 DIAGNOSIS — M24561 Contracture, right knee: Secondary | ICD-10-CM

## 2023-06-07 DIAGNOSIS — C61 Malignant neoplasm of prostate: Secondary | ICD-10-CM | POA: Diagnosis not present

## 2023-06-07 DIAGNOSIS — M48061 Spinal stenosis, lumbar region without neurogenic claudication: Secondary | ICD-10-CM

## 2023-06-07 DIAGNOSIS — Z7952 Long term (current) use of systemic steroids: Secondary | ICD-10-CM

## 2023-06-07 DIAGNOSIS — M47816 Spondylosis without myelopathy or radiculopathy, lumbar region: Secondary | ICD-10-CM

## 2023-06-07 DIAGNOSIS — R768 Other specified abnormal immunological findings in serum: Secondary | ICD-10-CM

## 2023-06-07 DIAGNOSIS — R300 Dysuria: Secondary | ICD-10-CM | POA: Diagnosis not present

## 2023-06-07 DIAGNOSIS — M24522 Contracture, left elbow: Secondary | ICD-10-CM

## 2023-06-07 DIAGNOSIS — D702 Other drug-induced agranulocytosis: Secondary | ICD-10-CM

## 2023-06-07 DIAGNOSIS — R351 Nocturia: Secondary | ICD-10-CM | POA: Diagnosis not present

## 2023-06-07 DIAGNOSIS — D3002 Benign neoplasm of left kidney: Secondary | ICD-10-CM

## 2023-06-07 DIAGNOSIS — Z510 Encounter for antineoplastic radiation therapy: Secondary | ICD-10-CM | POA: Diagnosis not present

## 2023-06-07 DIAGNOSIS — M0579 Rheumatoid arthritis with rheumatoid factor of multiple sites without organ or systems involvement: Secondary | ICD-10-CM

## 2023-06-07 DIAGNOSIS — K5900 Constipation, unspecified: Secondary | ICD-10-CM | POA: Diagnosis not present

## 2023-06-07 DIAGNOSIS — Z79899 Other long term (current) drug therapy: Secondary | ICD-10-CM

## 2023-06-07 LAB — RAD ONC ARIA SESSION SUMMARY
Course Elapsed Days: 27
Plan Fractions Treated to Date: 19
Plan Prescribed Dose Per Fraction: 2.5 Gy
Plan Total Fractions Prescribed: 28
Plan Total Prescribed Dose: 70 Gy
Reference Point Dosage Given to Date: 47.5 Gy
Reference Point Session Dosage Given: 2.5 Gy
Session Number: 19

## 2023-06-08 ENCOUNTER — Ambulatory Visit
Admission: RE | Admit: 2023-06-08 | Discharge: 2023-06-08 | Disposition: A | Discharge status: Home / Self Care | Payer: Medicare PPO | Source: Ambulatory Visit | Attending: Radiation Oncology | Admitting: Radiation Oncology

## 2023-06-08 ENCOUNTER — Other Ambulatory Visit: Payer: Self-pay

## 2023-06-08 DIAGNOSIS — E119 Type 2 diabetes mellitus without complications: Secondary | ICD-10-CM | POA: Diagnosis not present

## 2023-06-08 DIAGNOSIS — I4891 Unspecified atrial fibrillation: Secondary | ICD-10-CM | POA: Diagnosis not present

## 2023-06-08 DIAGNOSIS — K5900 Constipation, unspecified: Secondary | ICD-10-CM | POA: Diagnosis not present

## 2023-06-08 DIAGNOSIS — R300 Dysuria: Secondary | ICD-10-CM | POA: Diagnosis not present

## 2023-06-08 DIAGNOSIS — Z510 Encounter for antineoplastic radiation therapy: Secondary | ICD-10-CM | POA: Diagnosis not present

## 2023-06-08 DIAGNOSIS — Z7952 Long term (current) use of systemic steroids: Secondary | ICD-10-CM | POA: Diagnosis not present

## 2023-06-08 DIAGNOSIS — R351 Nocturia: Secondary | ICD-10-CM | POA: Diagnosis not present

## 2023-06-08 DIAGNOSIS — Z191 Hormone sensitive malignancy status: Secondary | ICD-10-CM | POA: Diagnosis not present

## 2023-06-08 DIAGNOSIS — I10 Essential (primary) hypertension: Secondary | ICD-10-CM | POA: Diagnosis not present

## 2023-06-08 DIAGNOSIS — C61 Malignant neoplasm of prostate: Secondary | ICD-10-CM | POA: Diagnosis not present

## 2023-06-08 DIAGNOSIS — E785 Hyperlipidemia, unspecified: Secondary | ICD-10-CM | POA: Diagnosis not present

## 2023-06-08 LAB — RAD ONC ARIA SESSION SUMMARY
Course Elapsed Days: 28
Plan Fractions Treated to Date: 20
Plan Prescribed Dose Per Fraction: 2.5 Gy
Plan Total Fractions Prescribed: 28
Plan Total Prescribed Dose: 70 Gy
Reference Point Dosage Given to Date: 50 Gy
Reference Point Session Dosage Given: 2.5 Gy
Session Number: 20

## 2023-06-09 ENCOUNTER — Other Ambulatory Visit: Payer: Self-pay

## 2023-06-09 ENCOUNTER — Ambulatory Visit: Payer: Medicare PPO | Admitting: Physician Assistant

## 2023-06-09 ENCOUNTER — Ambulatory Visit
Admission: RE | Admit: 2023-06-09 | Discharge: 2023-06-09 | Disposition: A | Discharge status: Home / Self Care | Payer: Medicare PPO | Source: Ambulatory Visit | Attending: Radiation Oncology | Admitting: Radiation Oncology

## 2023-06-09 ENCOUNTER — Other Ambulatory Visit (HOSPITAL_COMMUNITY): Payer: Self-pay

## 2023-06-09 DIAGNOSIS — Z7952 Long term (current) use of systemic steroids: Secondary | ICD-10-CM | POA: Diagnosis not present

## 2023-06-09 DIAGNOSIS — Z191 Hormone sensitive malignancy status: Secondary | ICD-10-CM | POA: Diagnosis not present

## 2023-06-09 DIAGNOSIS — I10 Essential (primary) hypertension: Secondary | ICD-10-CM | POA: Diagnosis not present

## 2023-06-09 DIAGNOSIS — I4891 Unspecified atrial fibrillation: Secondary | ICD-10-CM | POA: Diagnosis not present

## 2023-06-09 DIAGNOSIS — R351 Nocturia: Secondary | ICD-10-CM | POA: Diagnosis not present

## 2023-06-09 DIAGNOSIS — E119 Type 2 diabetes mellitus without complications: Secondary | ICD-10-CM | POA: Diagnosis not present

## 2023-06-09 DIAGNOSIS — K5900 Constipation, unspecified: Secondary | ICD-10-CM | POA: Diagnosis not present

## 2023-06-09 DIAGNOSIS — R300 Dysuria: Secondary | ICD-10-CM | POA: Diagnosis not present

## 2023-06-09 DIAGNOSIS — E785 Hyperlipidemia, unspecified: Secondary | ICD-10-CM | POA: Diagnosis not present

## 2023-06-09 DIAGNOSIS — Z510 Encounter for antineoplastic radiation therapy: Secondary | ICD-10-CM | POA: Diagnosis not present

## 2023-06-09 DIAGNOSIS — C61 Malignant neoplasm of prostate: Secondary | ICD-10-CM | POA: Diagnosis not present

## 2023-06-09 LAB — RAD ONC ARIA SESSION SUMMARY
Course Elapsed Days: 29
Plan Fractions Treated to Date: 21
Plan Prescribed Dose Per Fraction: 2.5 Gy
Plan Total Fractions Prescribed: 28
Plan Total Prescribed Dose: 70 Gy
Reference Point Dosage Given to Date: 52.5 Gy
Reference Point Session Dosage Given: 2.5 Gy
Session Number: 21

## 2023-06-10 ENCOUNTER — Other Ambulatory Visit: Payer: Self-pay

## 2023-06-10 ENCOUNTER — Ambulatory Visit: Payer: Medicare PPO

## 2023-06-10 ENCOUNTER — Ambulatory Visit
Admission: RE | Admit: 2023-06-10 | Discharge: 2023-06-10 | Disposition: A | Discharge status: Home / Self Care | Payer: Medicare PPO | Source: Ambulatory Visit | Attending: Radiation Oncology | Admitting: Radiation Oncology

## 2023-06-10 DIAGNOSIS — E785 Hyperlipidemia, unspecified: Secondary | ICD-10-CM | POA: Diagnosis not present

## 2023-06-10 DIAGNOSIS — Z191 Hormone sensitive malignancy status: Secondary | ICD-10-CM | POA: Diagnosis not present

## 2023-06-10 DIAGNOSIS — K5900 Constipation, unspecified: Secondary | ICD-10-CM | POA: Diagnosis not present

## 2023-06-10 DIAGNOSIS — R300 Dysuria: Secondary | ICD-10-CM | POA: Diagnosis not present

## 2023-06-10 DIAGNOSIS — R351 Nocturia: Secondary | ICD-10-CM | POA: Diagnosis not present

## 2023-06-10 DIAGNOSIS — C61 Malignant neoplasm of prostate: Secondary | ICD-10-CM | POA: Diagnosis not present

## 2023-06-10 DIAGNOSIS — I10 Essential (primary) hypertension: Secondary | ICD-10-CM | POA: Diagnosis not present

## 2023-06-10 DIAGNOSIS — I4891 Unspecified atrial fibrillation: Secondary | ICD-10-CM | POA: Diagnosis not present

## 2023-06-10 DIAGNOSIS — Z510 Encounter for antineoplastic radiation therapy: Secondary | ICD-10-CM | POA: Diagnosis not present

## 2023-06-10 DIAGNOSIS — Z7952 Long term (current) use of systemic steroids: Secondary | ICD-10-CM | POA: Diagnosis not present

## 2023-06-10 DIAGNOSIS — E119 Type 2 diabetes mellitus without complications: Secondary | ICD-10-CM | POA: Diagnosis not present

## 2023-06-10 LAB — RAD ONC ARIA SESSION SUMMARY
Course Elapsed Days: 30
Plan Fractions Treated to Date: 22
Plan Prescribed Dose Per Fraction: 2.5 Gy
Plan Total Fractions Prescribed: 28
Plan Total Prescribed Dose: 70 Gy
Reference Point Dosage Given to Date: 55 Gy
Reference Point Session Dosage Given: 2.5 Gy
Session Number: 22

## 2023-06-11 ENCOUNTER — Ambulatory Visit
Admission: RE | Admit: 2023-06-11 | Discharge: 2023-06-11 | Disposition: A | Discharge status: Home / Self Care | Payer: Medicare PPO | Source: Ambulatory Visit | Attending: Radiation Oncology | Admitting: Radiation Oncology

## 2023-06-11 ENCOUNTER — Other Ambulatory Visit: Payer: Self-pay

## 2023-06-11 DIAGNOSIS — E119 Type 2 diabetes mellitus without complications: Secondary | ICD-10-CM | POA: Diagnosis not present

## 2023-06-11 DIAGNOSIS — Z7952 Long term (current) use of systemic steroids: Secondary | ICD-10-CM | POA: Diagnosis not present

## 2023-06-11 DIAGNOSIS — E785 Hyperlipidemia, unspecified: Secondary | ICD-10-CM | POA: Diagnosis not present

## 2023-06-11 DIAGNOSIS — I10 Essential (primary) hypertension: Secondary | ICD-10-CM | POA: Insufficient documentation

## 2023-06-11 DIAGNOSIS — Z79899 Other long term (current) drug therapy: Secondary | ICD-10-CM | POA: Insufficient documentation

## 2023-06-11 DIAGNOSIS — R351 Nocturia: Secondary | ICD-10-CM | POA: Diagnosis not present

## 2023-06-11 DIAGNOSIS — C61 Malignant neoplasm of prostate: Secondary | ICD-10-CM | POA: Insufficient documentation

## 2023-06-11 DIAGNOSIS — Z87891 Personal history of nicotine dependence: Secondary | ICD-10-CM | POA: Diagnosis not present

## 2023-06-11 DIAGNOSIS — Z510 Encounter for antineoplastic radiation therapy: Secondary | ICD-10-CM | POA: Diagnosis not present

## 2023-06-11 DIAGNOSIS — Z191 Hormone sensitive malignancy status: Secondary | ICD-10-CM | POA: Diagnosis not present

## 2023-06-11 DIAGNOSIS — K5900 Constipation, unspecified: Secondary | ICD-10-CM | POA: Insufficient documentation

## 2023-06-11 DIAGNOSIS — Z7902 Long term (current) use of antithrombotics/antiplatelets: Secondary | ICD-10-CM | POA: Diagnosis not present

## 2023-06-11 DIAGNOSIS — R300 Dysuria: Secondary | ICD-10-CM | POA: Diagnosis not present

## 2023-06-11 DIAGNOSIS — Z85528 Personal history of other malignant neoplasm of kidney: Secondary | ICD-10-CM | POA: Insufficient documentation

## 2023-06-11 DIAGNOSIS — Z8673 Personal history of transient ischemic attack (TIA), and cerebral infarction without residual deficits: Secondary | ICD-10-CM | POA: Insufficient documentation

## 2023-06-11 DIAGNOSIS — I4891 Unspecified atrial fibrillation: Secondary | ICD-10-CM | POA: Diagnosis not present

## 2023-06-11 LAB — RAD ONC ARIA SESSION SUMMARY
Course Elapsed Days: 31
Plan Fractions Treated to Date: 23
Plan Prescribed Dose Per Fraction: 2.5 Gy
Plan Total Fractions Prescribed: 28
Plan Total Prescribed Dose: 70 Gy
Reference Point Dosage Given to Date: 57.5 Gy
Reference Point Session Dosage Given: 2.5 Gy
Session Number: 23

## 2023-06-14 ENCOUNTER — Inpatient Hospital Stay: Payer: Medicare PPO | Attending: Oncology

## 2023-06-14 ENCOUNTER — Ambulatory Visit
Admission: RE | Admit: 2023-06-14 | Discharge: 2023-06-14 | Discharge status: Home / Self Care | Payer: Medicare PPO | Source: Ambulatory Visit | Attending: Radiation Oncology | Admitting: Radiation Oncology

## 2023-06-14 ENCOUNTER — Other Ambulatory Visit: Payer: Self-pay

## 2023-06-14 DIAGNOSIS — R351 Nocturia: Secondary | ICD-10-CM | POA: Diagnosis not present

## 2023-06-14 DIAGNOSIS — E119 Type 2 diabetes mellitus without complications: Secondary | ICD-10-CM | POA: Diagnosis not present

## 2023-06-14 DIAGNOSIS — R300 Dysuria: Secondary | ICD-10-CM | POA: Diagnosis not present

## 2023-06-14 DIAGNOSIS — Z510 Encounter for antineoplastic radiation therapy: Secondary | ICD-10-CM | POA: Diagnosis not present

## 2023-06-14 DIAGNOSIS — E785 Hyperlipidemia, unspecified: Secondary | ICD-10-CM | POA: Diagnosis not present

## 2023-06-14 DIAGNOSIS — Z7952 Long term (current) use of systemic steroids: Secondary | ICD-10-CM | POA: Diagnosis not present

## 2023-06-14 DIAGNOSIS — I10 Essential (primary) hypertension: Secondary | ICD-10-CM | POA: Diagnosis not present

## 2023-06-14 DIAGNOSIS — I4891 Unspecified atrial fibrillation: Secondary | ICD-10-CM | POA: Diagnosis not present

## 2023-06-14 DIAGNOSIS — K5900 Constipation, unspecified: Secondary | ICD-10-CM | POA: Diagnosis not present

## 2023-06-14 DIAGNOSIS — C61 Malignant neoplasm of prostate: Secondary | ICD-10-CM | POA: Diagnosis not present

## 2023-06-14 DIAGNOSIS — Z191 Hormone sensitive malignancy status: Secondary | ICD-10-CM | POA: Diagnosis not present

## 2023-06-14 LAB — RAD ONC ARIA SESSION SUMMARY
Course Elapsed Days: 34
Plan Fractions Treated to Date: 24
Plan Prescribed Dose Per Fraction: 2.5 Gy
Plan Total Fractions Prescribed: 28
Plan Total Prescribed Dose: 70 Gy
Reference Point Dosage Given to Date: 60 Gy
Reference Point Session Dosage Given: 2.5 Gy
Session Number: 24

## 2023-06-14 MED ORDER — AMLODIPINE BESYLATE 10 MG PO TABS: 10.0000 mg | ORAL_TABLET | Freq: Every morning | ORAL | 0 refills | Status: DC

## 2023-06-15 ENCOUNTER — Other Ambulatory Visit: Payer: Self-pay

## 2023-06-15 ENCOUNTER — Ambulatory Visit
Admission: RE | Admit: 2023-06-15 | Discharge: 2023-06-15 | Discharge status: Home / Self Care | Payer: Medicare PPO | Source: Ambulatory Visit | Attending: Radiation Oncology | Admitting: Radiation Oncology

## 2023-06-15 ENCOUNTER — Other Ambulatory Visit: Payer: Self-pay | Admitting: *Deleted

## 2023-06-15 DIAGNOSIS — Z191 Hormone sensitive malignancy status: Secondary | ICD-10-CM | POA: Diagnosis not present

## 2023-06-15 DIAGNOSIS — R351 Nocturia: Secondary | ICD-10-CM | POA: Diagnosis not present

## 2023-06-15 DIAGNOSIS — C61 Malignant neoplasm of prostate: Secondary | ICD-10-CM | POA: Diagnosis not present

## 2023-06-15 DIAGNOSIS — K5900 Constipation, unspecified: Secondary | ICD-10-CM | POA: Diagnosis not present

## 2023-06-15 DIAGNOSIS — R300 Dysuria: Secondary | ICD-10-CM | POA: Diagnosis not present

## 2023-06-15 DIAGNOSIS — I10 Essential (primary) hypertension: Secondary | ICD-10-CM | POA: Diagnosis not present

## 2023-06-15 DIAGNOSIS — I4891 Unspecified atrial fibrillation: Secondary | ICD-10-CM | POA: Diagnosis not present

## 2023-06-15 DIAGNOSIS — E785 Hyperlipidemia, unspecified: Secondary | ICD-10-CM | POA: Diagnosis not present

## 2023-06-15 DIAGNOSIS — Z7952 Long term (current) use of systemic steroids: Secondary | ICD-10-CM | POA: Diagnosis not present

## 2023-06-15 DIAGNOSIS — E119 Type 2 diabetes mellitus without complications: Secondary | ICD-10-CM | POA: Diagnosis not present

## 2023-06-15 DIAGNOSIS — Z510 Encounter for antineoplastic radiation therapy: Secondary | ICD-10-CM | POA: Diagnosis not present

## 2023-06-15 LAB — RAD ONC ARIA SESSION SUMMARY
Course Elapsed Days: 35
Plan Fractions Treated to Date: 25
Plan Prescribed Dose Per Fraction: 2.5 Gy
Plan Total Fractions Prescribed: 28
Plan Total Prescribed Dose: 70 Gy
Reference Point Dosage Given to Date: 62.5 Gy
Reference Point Session Dosage Given: 2.5 Gy
Session Number: 25

## 2023-06-15 MED ORDER — TAMSULOSIN HCL 0.4 MG PO CAPS: 0.4000 mg | ORAL_CAPSULE | Freq: Every day | ORAL | 4 refills | Status: DC

## 2023-06-16 ENCOUNTER — Ambulatory Visit
Admission: RE | Admit: 2023-06-16 | Discharge: 2023-06-16 | Discharge status: Home / Self Care | Payer: Medicare PPO | Source: Ambulatory Visit | Attending: Radiation Oncology | Admitting: Radiation Oncology

## 2023-06-16 ENCOUNTER — Other Ambulatory Visit: Payer: Self-pay

## 2023-06-16 ENCOUNTER — Other Ambulatory Visit (HOSPITAL_COMMUNITY): Payer: Self-pay

## 2023-06-16 DIAGNOSIS — Z7952 Long term (current) use of systemic steroids: Secondary | ICD-10-CM | POA: Diagnosis not present

## 2023-06-16 DIAGNOSIS — I10 Essential (primary) hypertension: Secondary | ICD-10-CM | POA: Diagnosis not present

## 2023-06-16 DIAGNOSIS — K5900 Constipation, unspecified: Secondary | ICD-10-CM | POA: Diagnosis not present

## 2023-06-16 DIAGNOSIS — R300 Dysuria: Secondary | ICD-10-CM | POA: Diagnosis not present

## 2023-06-16 DIAGNOSIS — I4891 Unspecified atrial fibrillation: Secondary | ICD-10-CM | POA: Diagnosis not present

## 2023-06-16 DIAGNOSIS — Z510 Encounter for antineoplastic radiation therapy: Secondary | ICD-10-CM | POA: Diagnosis not present

## 2023-06-16 DIAGNOSIS — Z191 Hormone sensitive malignancy status: Secondary | ICD-10-CM | POA: Diagnosis not present

## 2023-06-16 DIAGNOSIS — R351 Nocturia: Secondary | ICD-10-CM | POA: Diagnosis not present

## 2023-06-16 DIAGNOSIS — E785 Hyperlipidemia, unspecified: Secondary | ICD-10-CM | POA: Diagnosis not present

## 2023-06-16 DIAGNOSIS — C61 Malignant neoplasm of prostate: Secondary | ICD-10-CM | POA: Diagnosis not present

## 2023-06-16 DIAGNOSIS — E119 Type 2 diabetes mellitus without complications: Secondary | ICD-10-CM | POA: Diagnosis not present

## 2023-06-16 LAB — RAD ONC ARIA SESSION SUMMARY
Course Elapsed Days: 36
Plan Fractions Treated to Date: 26
Plan Prescribed Dose Per Fraction: 2.5 Gy
Plan Total Fractions Prescribed: 28
Plan Total Prescribed Dose: 70 Gy
Reference Point Dosage Given to Date: 65 Gy
Reference Point Session Dosage Given: 2.5 Gy
Session Number: 26

## 2023-06-17 ENCOUNTER — Ambulatory Visit
Admission: RE | Admit: 2023-06-17 | Discharge: 2023-06-17 | Disposition: A | Discharge status: Home / Self Care | Payer: Medicare PPO | Source: Ambulatory Visit | Attending: Radiation Oncology | Admitting: Radiation Oncology

## 2023-06-17 ENCOUNTER — Ambulatory Visit: Payer: Medicare PPO

## 2023-06-17 ENCOUNTER — Other Ambulatory Visit: Payer: Self-pay

## 2023-06-17 DIAGNOSIS — E119 Type 2 diabetes mellitus without complications: Secondary | ICD-10-CM | POA: Diagnosis not present

## 2023-06-17 DIAGNOSIS — Z510 Encounter for antineoplastic radiation therapy: Secondary | ICD-10-CM | POA: Diagnosis not present

## 2023-06-17 DIAGNOSIS — R351 Nocturia: Secondary | ICD-10-CM | POA: Diagnosis not present

## 2023-06-17 DIAGNOSIS — I10 Essential (primary) hypertension: Secondary | ICD-10-CM | POA: Diagnosis not present

## 2023-06-17 DIAGNOSIS — I4891 Unspecified atrial fibrillation: Secondary | ICD-10-CM | POA: Diagnosis not present

## 2023-06-17 DIAGNOSIS — Z7952 Long term (current) use of systemic steroids: Secondary | ICD-10-CM | POA: Diagnosis not present

## 2023-06-17 DIAGNOSIS — Z191 Hormone sensitive malignancy status: Secondary | ICD-10-CM | POA: Diagnosis not present

## 2023-06-17 DIAGNOSIS — K5900 Constipation, unspecified: Secondary | ICD-10-CM | POA: Diagnosis not present

## 2023-06-17 DIAGNOSIS — E785 Hyperlipidemia, unspecified: Secondary | ICD-10-CM | POA: Diagnosis not present

## 2023-06-17 DIAGNOSIS — C61 Malignant neoplasm of prostate: Secondary | ICD-10-CM | POA: Diagnosis not present

## 2023-06-17 DIAGNOSIS — R300 Dysuria: Secondary | ICD-10-CM | POA: Diagnosis not present

## 2023-06-17 LAB — RAD ONC ARIA SESSION SUMMARY
Course Elapsed Days: 37
Plan Fractions Treated to Date: 27
Plan Prescribed Dose Per Fraction: 2.5 Gy
Plan Total Fractions Prescribed: 28
Plan Total Prescribed Dose: 70 Gy
Reference Point Dosage Given to Date: 67.5 Gy
Reference Point Session Dosage Given: 2.5 Gy
Session Number: 27

## 2023-06-18 ENCOUNTER — Other Ambulatory Visit: Payer: Self-pay

## 2023-06-18 ENCOUNTER — Ambulatory Visit: Payer: Medicare PPO

## 2023-06-18 ENCOUNTER — Ambulatory Visit
Admission: RE | Admit: 2023-06-18 | Discharge: 2023-06-18 | Disposition: A | Discharge status: Home / Self Care | Payer: Medicare PPO | Source: Ambulatory Visit | Attending: Radiation Oncology | Admitting: Radiation Oncology

## 2023-06-18 DIAGNOSIS — I4891 Unspecified atrial fibrillation: Secondary | ICD-10-CM | POA: Diagnosis not present

## 2023-06-18 DIAGNOSIS — E785 Hyperlipidemia, unspecified: Secondary | ICD-10-CM | POA: Diagnosis not present

## 2023-06-18 DIAGNOSIS — K5900 Constipation, unspecified: Secondary | ICD-10-CM | POA: Diagnosis not present

## 2023-06-18 DIAGNOSIS — C61 Malignant neoplasm of prostate: Secondary | ICD-10-CM | POA: Diagnosis not present

## 2023-06-18 DIAGNOSIS — R351 Nocturia: Secondary | ICD-10-CM | POA: Diagnosis not present

## 2023-06-18 DIAGNOSIS — Z510 Encounter for antineoplastic radiation therapy: Secondary | ICD-10-CM | POA: Diagnosis not present

## 2023-06-18 DIAGNOSIS — I10 Essential (primary) hypertension: Secondary | ICD-10-CM | POA: Diagnosis not present

## 2023-06-18 DIAGNOSIS — R300 Dysuria: Secondary | ICD-10-CM | POA: Diagnosis not present

## 2023-06-18 DIAGNOSIS — E119 Type 2 diabetes mellitus without complications: Secondary | ICD-10-CM | POA: Diagnosis not present

## 2023-06-18 DIAGNOSIS — Z7952 Long term (current) use of systemic steroids: Secondary | ICD-10-CM | POA: Diagnosis not present

## 2023-06-18 DIAGNOSIS — Z191 Hormone sensitive malignancy status: Secondary | ICD-10-CM | POA: Diagnosis not present

## 2023-06-18 LAB — RAD ONC ARIA SESSION SUMMARY
Course Elapsed Days: 38
Plan Fractions Treated to Date: 28
Plan Prescribed Dose Per Fraction: 2.5 Gy
Plan Total Fractions Prescribed: 28
Plan Total Prescribed Dose: 70 Gy
Reference Point Dosage Given to Date: 70 Gy
Reference Point Session Dosage Given: 2.5 Gy
Session Number: 28

## 2023-06-19 ENCOUNTER — Other Ambulatory Visit: Payer: Self-pay | Admitting: Cardiology

## 2023-06-21 ENCOUNTER — Ambulatory Visit: Payer: Medicare PPO

## 2023-06-21 ENCOUNTER — Other Ambulatory Visit: Payer: Self-pay

## 2023-06-22 ENCOUNTER — Ambulatory Visit: Payer: Medicare PPO

## 2023-06-23 ENCOUNTER — Ambulatory Visit: Payer: Medicare PPO

## 2023-06-23 ENCOUNTER — Other Ambulatory Visit: Payer: Self-pay

## 2023-06-24 ENCOUNTER — Ambulatory Visit: Payer: Medicare PPO

## 2023-06-24 MED ORDER — FOLIC ACID 1 MG PO TABS: 1.0000 mg | ORAL_TABLET | Freq: Every day | ORAL | 1 refills | Status: DC

## 2023-06-25 ENCOUNTER — Ambulatory Visit: Payer: Medicare PPO

## 2023-06-28 ENCOUNTER — Other Ambulatory Visit (HOSPITAL_COMMUNITY): Payer: Self-pay

## 2023-06-28 ENCOUNTER — Ambulatory Visit: Payer: Medicare PPO

## 2023-06-29 ENCOUNTER — Ambulatory Visit: Payer: Medicare PPO

## 2023-06-30 ENCOUNTER — Ambulatory Visit: Payer: Medicare PPO

## 2023-07-01 ENCOUNTER — Ambulatory Visit (INDEPENDENT_AMBULATORY_CARE_PROVIDER_SITE_OTHER): Payer: Medicare PPO | Admitting: Cardiology

## 2023-07-01 ENCOUNTER — Ambulatory Visit: Payer: Medicare PPO

## 2023-07-01 ENCOUNTER — Encounter: Payer: Self-pay | Admitting: Cardiology

## 2023-07-01 VITALS — BP 104/64 | HR 77 | Ht 71.0 in | Wt 193.0 lb

## 2023-07-01 DIAGNOSIS — Z131 Encounter for screening for diabetes mellitus: Secondary | ICD-10-CM | POA: Diagnosis not present

## 2023-07-01 DIAGNOSIS — E782 Mixed hyperlipidemia: Secondary | ICD-10-CM | POA: Diagnosis not present

## 2023-07-01 DIAGNOSIS — I10 Essential (primary) hypertension: Secondary | ICD-10-CM | POA: Diagnosis not present

## 2023-07-01 DIAGNOSIS — E538 Deficiency of other specified B group vitamins: Secondary | ICD-10-CM

## 2023-07-01 DIAGNOSIS — E559 Vitamin D deficiency, unspecified: Secondary | ICD-10-CM | POA: Diagnosis not present

## 2023-07-01 MED ORDER — FOLIC ACID 1 MG PO TABS: 1.0000 mg | ORAL_TABLET | Freq: Every day | ORAL | 1 refills | Status: DC

## 2023-07-01 MED ORDER — VITAMIN D-3 125 MCG (5000 UT) PO TABS: 1.0000 | ORAL_TABLET | Freq: Every day | ORAL | 4 refills | Status: AC

## 2023-07-01 MED ORDER — ROSUVASTATIN CALCIUM 40 MG PO TABS: 40.0000 mg | ORAL_TABLET | Freq: Every day | ORAL | 1 refills | Status: DC

## 2023-07-01 NOTE — Progress Notes (Signed)
Established Patient Office Visit  Subjective:  Patient ID: Michael Hinton., male    DOB: 1951-06-06  Age: 72 y.o. MRN: [id number]  Chief Complaint  Patient presents with   Follow-up    Medication refills    Patient in office for follow up, needs refills. Patient reports feeling well, no complaints today. No recent lab work, patient will return for fasting lab work.      No other concerns at this time.   Past Medical History:  Diagnosis Date   Anemia    Aortic atherosclerosis (HCC)    Atrial fibrillation (HCC)    a.) CHA2DS2VASc = 5 (age, HTN, CVA x 2, vascular disease history);  b.) rate/rhythm maintained without pharmacological intervention; chronically anticoagulated with clopidogrel   Bilateral renal cysts 05/29/2022   Carotid artery disease (HCC) 04/11/2022   a.) doppler 04/11/2022: <50 LICA, no sig RICA.   CVA (cerebral vascular accident) [facility])    a.) MRI brain 01/14/2022: numerous chronic cerebellar infarcts; b.) CT head 04/10/2022: small old lacunar infarcts are seen in cerebellum bilaterally   Diastolic dysfunction 04/11/2022   a.) TTE 04/11/2022: EF 55-60%, mild BAE, mild MR, G1DD.   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   T2DM (type 2 diabetes mellitus) (HCC)    Thoracic ascending aortic aneurysm (HCC) 06/01/2022   a.) CT chest - measured 4.2 cm    Past Surgical History:  Procedure Laterality  Date   LAPAROSCOPIC NEPHRECTOMY, HAND ASSISTED Left 07/13/2022   Procedure: HAND ASSISTED LAPAROSCOPIC RADICAL NEPHRECTOMY;  Surgeon: Vanna Scotland, MD;  Location: [facility] ORS;  Service: Urology;  Laterality: Left;   RENAL BIOPSY  05/2022    Social History   Socioeconomic History   Marital status: Married    Spouse name: Felecia   Number of children: Not on file   Years of education: Not on file   Highest education level: Not on file  Occupational History   Not on file  Tobacco Use   Smoking status: Former    Current packs/day: 0.00    Average packs/day: 0.5 packs/day for 7.0 years (3.5 ttl pk-yrs)    Types: Cigarettes    Start date: 03/16/1971    Quit date: 03/15/1978    Years since quitting: 45.3    Passive exposure: Past   Smokeless tobacco: Never  Vaping Use   Vaping status: Never Used  Substance and Sexual Activity   Alcohol use: Not Currently   Drug use: No   Sexual activity: Yes  Other Topics Concern   Not on file  Social History Narrative   Not on file   Social Determinants of Health   Financial Resource Strain: Not on file  Food Insecurity: Not on file  Transportation Needs: Not on file  Physical Activity: Not on file  Stress: Not on file  Social Connections: Not on file  Intimate Partner Violence: Not on  file    Family History  Problem Relation Age of Onset   Heart disease Mother    Diabetes Mother    Breast cancer Mother    Kidney disease Father    Diabetes Father    Diabetes Daughter     No Known Allergies  Outpatient Medications Prior to Visit  Medication Sig   amLODipine (NORVASC) 10 MG tablet Take 1 tablet (10 mg total) by mouth every morning.   Cholecalciferol (VITAMIN D-3) 125 MCG (5000 UT) TABS Take 1 tablet by mouth daily.   Ferrous Sulfate (IRON PO) Take 1 tablet by mouth daily.   folic acid (FOLVITE) 1 MG tablet Take 1 tablet (1 mg total) by mouth daily.   losartan (COZAAR) 50 MG tablet Take 1 tablet (50 mg total) by mouth daily.    predniSONE (DELTASONE) 1 MG tablet Take 4 tablets (4 mg total) by mouth daily with breakfast. (Patient taking differently: Take 1 mg by mouth daily with breakfast.)   rosuvastatin (CRESTOR) 40 MG tablet Take 40 mg by mouth daily.   tamsulosin (FLOMAX) 0.4 MG CAPS capsule Take 1 capsule (0.4 mg total) by mouth daily after supper.   Abatacept (ORENCIA CLICKJECT) 125 MG/ML SOAJ Inject 125 mg into the skin every 14 (fourteen) days. (Patient not taking: Reported on 07/01/2023)   No facility-administered medications prior to visit.    Review of Systems  Constitutional: Negative.   HENT: Negative.    Eyes: Negative.   Respiratory: Negative.  Negative for shortness of breath.   Cardiovascular: Negative.  Negative for chest pain.  Gastrointestinal: Negative.  Negative for abdominal pain, constipation and diarrhea.  Genitourinary: Negative.   Musculoskeletal:  Negative for joint pain and myalgias.  Skin: Negative.   Neurological: Negative.  Negative for dizziness and headaches.  Endo/Heme/Allergies: Negative.   All other systems reviewed and are negative.      Objective:   BP 104/64   Pulse 77   Ht 5\' 11"  (1.803 m)   Wt 193 lb (87.5 kg)   SpO2 98%   BMI 26.92 kg/m   Vitals:   07/01/23 1141  BP: 104/64  Pulse: 77  Height: 5\' 11"  (1.803 m)  Weight: 193 lb (87.5 kg)  SpO2: 98%  BMI (Calculated): 26.93    Physical Exam Nursing note reviewed.  Constitutional:      Appearance: Normal appearance. He is normal weight.  HENT:     Head: Normocephalic and atraumatic.     Nose: Nose normal.     Mouth/Throat:     Mouth: Mucous membranes are moist.     Pharynx: Oropharynx is clear.  Eyes:     Extraocular Movements: Extraocular movements intact.     Conjunctiva/sclera: Conjunctivae normal.     Pupils: Pupils are equal, round, and reactive to light.  Cardiovascular:     Rate and Rhythm: Normal rate and regular rhythm.     Pulses: Normal pulses.     Heart sounds: Normal heart  sounds.  Pulmonary:     Effort: Pulmonary effort is normal.     Breath sounds: Normal breath sounds.  Abdominal:     General: Abdomen is flat. Bowel sounds are normal.     Palpations: Abdomen is soft.  Musculoskeletal:        General: Normal range of motion.     Cervical back: Normal range of motion.  Skin:    General: Skin is warm and dry.  Neurological:     General: No focal deficit present.  Mental Status: He is alert and oriented to person, place, and time.  Psychiatric:        Mood and Affect: Mood normal.        Behavior: Behavior normal.        Thought Content: Thought content normal.        Judgment: Judgment normal.      No results found for any visits on 07/01/23.  Recent Results (from the past 2160 hour(s))  Rad Onc [facility]ria Session Summary     Status: None   Collection Time: 05/11/23  1:27 PM  Result Value Ref Range   Course ID [email or web address]    Course Intent Unknown    Course Start Date 04/29/2023  2:20 PM    Session Number 1    Course First Treatment Date 05/11/2023  1:24 PM    Course Last Treatment Date 05/11/2023  1:25 PM    Course Elapsed Days 0    Reference Point ID Prostate    Reference Point Dosage Given to Date 2.5 Gy   Reference Point Session Dosage Given 2.5 Gy   Plan ID Prostate    Plan Name 2fx    Plan Fractions Treated to Date 1    Plan Total Fractions Prescribed 28    Plan Prescribed Dose Per Fraction 2.5 Gy   Plan Total Prescribed Dose 70.000000 Gy   Plan Primary Reference Point Prostate   Rad Jeralene Huff Session Summary     Status: None   Collection Time: 05/13/23  2:13 PM  Result Value Ref Range   Course ID [email or web address]    Course Intent Unknown    Course Start Date 04/29/2023  2:20 PM    Session Number 2    Course First Treatment Date 05/11/2023  1:24 PM    Course Last Treatment Date 05/13/2023  2:11 PM    Course Elapsed Days 2    Reference Point ID Prostate    Reference Point Dosage Given to Date 5 Gy   Reference Point Session Dosage Given  2.5 Gy   Plan ID Prostate    Plan Name 47fx    Plan Fractions Treated to Date 2    Plan Total Fractions Prescribed 28    Plan Prescribed Dose Per Fraction 2.5 Gy   Plan Total Prescribed Dose 70.000000 Gy   Plan Primary Reference Point Prostate   Rad Jeralene Huff Session Summary     Status: None   Collection Time: 05/14/23  3:32 PM  Result Value Ref Range   Course ID [email or web address]    Course Intent Unknown    Course Start Date 04/29/2023  2:20 PM    Session Number 3    Course First Treatment Date 05/11/2023  1:24 PM    Course Last Treatment Date 05/14/2023  3:30 PM    Course Elapsed Days 3    Reference Point ID Prostate    Reference Point Dosage Given to Date 7.5 Gy   Reference Point Session Dosage Given 2.5 Gy   Plan ID Prostate    Plan Name 49fx    Plan Fractions Treated to Date 3    Plan Total Fractions Prescribed 28    Plan Prescribed Dose Per Fraction 2.5 Gy   Plan Total Prescribed Dose 70.000000 Gy   Plan Primary Reference Point Prostate   Rad Jeralene Huff Session Summary     Status: None   Collection Time: 05/17/23  1:18 PM  Result Value Ref Range   Course ID [email or web address]    Course  Intent Unknown    Course Start Date 04/29/2023  2:20 PM    Session Number 4    Course First Treatment Date 05/11/2023  1:24 PM    Course Last Treatment Date 05/17/2023  1:16 PM    Course Elapsed Days 6    Reference Point ID Prostate    Reference Point Dosage Given to Date 10 Gy   Reference Point Session Dosage Given 2.5 Gy   Plan ID Prostate    Plan Name 48fx    Plan Fractions Treated to Date 4    Plan Total Fractions Prescribed 28    Plan Prescribed Dose Per Fraction 2.5 Gy   Plan Total Prescribed Dose 70.000000 Gy   Plan Primary Reference Point Prostate   Rad Jeralene Huff Session Summary     Status: None   Collection Time: 05/18/23  1:13 PM  Result Value Ref Range   Course ID [email or web address]    Course Intent Unknown    Course Start Date 04/29/2023  2:20 PM    Session Number 5    Course First Treatment  Date 05/11/2023  1:24 PM    Course Last Treatment Date 05/18/2023  1:10 PM    Course Elapsed Days 7    Reference Point ID Prostate    Reference Point Dosage Given to Date 12.5 Gy   Reference Point Session Dosage Given 2.5 Gy   Plan ID Prostate    Plan Name 62fx    Plan Fractions Treated to Date 5    Plan Total Fractions Prescribed 28    Plan Prescribed Dose Per Fraction 2.5 Gy   Plan Total Prescribed Dose 70.000000 Gy   Plan Primary Reference Point Prostate   Rad Jeralene Huff Session Summary     Status: None   Collection Time: 05/19/23  1:08 PM  Result Value Ref Range   Course ID [email or web address]    Course Intent Unknown    Course Start Date 04/29/2023  2:20 PM    Session Number 6    Course First Treatment Date 05/11/2023  1:24 PM    Course Last Treatment Date 05/19/2023  1:06 PM    Course Elapsed Days 8    Reference Point ID Prostate    Reference Point Dosage Given to Date 15 Gy   Reference Point Session Dosage Given 2.5 Gy   Plan ID Prostate    Plan Name 65fx    Plan Fractions Treated to Date 6    Plan Total Fractions Prescribed 28    Plan Prescribed Dose Per Fraction 2.5 Gy   Plan Total Prescribed Dose 70.000000 Gy   Plan Primary Reference Point Prostate   Rad Jeralene Huff Session Summary     Status: None   Collection Time: 05/20/23  1:08 PM  Result Value Ref Range   Course ID [email or web address]    Course Intent Unknown    Course Start Date 04/29/2023  2:20 PM    Session Number 7    Course First Treatment Date 05/11/2023  1:24 PM    Course Last Treatment Date 05/20/2023  1:06 PM    Course Elapsed Days 9    Reference Point ID Prostate    Reference Point Dosage Given to Date 17.5 Gy   Reference Point Session Dosage Given 2.5 Gy   Plan ID Prostate    Plan Name 33fx    Plan Fractions Treated to Date 7    Plan Total Fractions Prescribed 28    Plan Prescribed Dose Per Fraction 2.5 Gy  Plan Total Prescribed Dose 70.000000 Gy   Plan Primary Reference Point Prostate   Rad Onc Aria Session  Summary     Status: None   Collection Time: 05/21/23  1:08 PM  Result Value Ref Range   Course ID [email or web address]    Course Intent Unknown    Course Start Date 04/29/2023  2:20 PM    Session Number 8    Course First Treatment Date 05/11/2023  1:24 PM    Course Last Treatment Date 05/21/2023  1:05 PM    Course Elapsed Days 10    Reference Point ID Prostate    Reference Point Dosage Given to Date 20 Gy   Reference Point Session Dosage Given 2.5 Gy   Plan ID Prostate    Plan Name 34fx    Plan Fractions Treated to Date 8    Plan Total Fractions Prescribed 28    Plan Prescribed Dose Per Fraction 2.5 Gy   Plan Total Prescribed Dose 70.000000 Gy   Plan Primary Reference Point Prostate   Rad Jeralene Huff Session Summary     Status: None   Collection Time: 05/24/23  1:09 PM  Result Value Ref Range   Course ID [email or web address]    Course Intent Unknown    Course Start Date 04/29/2023  2:20 PM    Session Number 9    Course First Treatment Date 05/11/2023  1:24 PM    Course Last Treatment Date 05/24/2023  1:06 PM    Course Elapsed Days 13    Reference Point ID Prostate    Reference Point Dosage Given to Date 22.5 Gy   Reference Point Session Dosage Given 2.5 Gy   Plan ID Prostate    Plan Name 35fx    Plan Fractions Treated to Date 9    Plan Total Fractions Prescribed 28    Plan Prescribed Dose Per Fraction 2.5 Gy   Plan Total Prescribed Dose 70.000000 Gy   Plan Primary Reference Point Prostate   Rad Jeralene Huff Session Summary     Status: None   Collection Time: 05/25/23  1:14 PM  Result Value Ref Range   Course ID [email or web address]    Course Intent Unknown    Course Start Date 04/29/2023  2:20 PM    Session Number 10    Course First Treatment Date 05/11/2023  1:24 PM    Course Last Treatment Date 05/25/2023  1:12 PM    Course Elapsed Days 14    Reference Point ID Prostate    Reference Point Dosage Given to Date 25 Gy   Reference Point Session Dosage Given 2.5 Gy   Plan ID Prostate    Plan Name 42fx     Plan Fractions Treated to Date 10    Plan Total Fractions Prescribed 28    Plan Prescribed Dose Per Fraction 2.5 Gy   Plan Total Prescribed Dose 70.000000 Gy   Plan Primary Reference Point Prostate   Rad Jeralene Huff Session Summary     Status: None   Collection Time: 05/26/23  1:08 PM  Result Value Ref Range   Course ID [email or web address]    Course Intent Unknown    Course Start Date 04/29/2023  2:20 PM    Session Number 11    Course First Treatment Date 05/11/2023  1:24 PM    Course Last Treatment Date 05/26/2023  1:06 PM    Course Elapsed Days 15    Reference Point ID Prostate    Reference Point Dosage Given to Date 72.5  Gy   Reference Point Session Dosage Given 2.5 Gy   Plan ID Prostate    Plan Name 49fx    Plan Fractions Treated to Date 51    Plan Total Fractions Prescribed 28    Plan Prescribed Dose Per Fraction 2.5 Gy   Plan Total Prescribed Dose 70.000000 Gy   Plan Primary Reference Point Prostate   Rad Onc [facility]ria Session Summary     Status: None   Collection Time: 05/27/23  1:10 PM  Result Value Ref Range   Course ID [email or web address]    Course Intent Unknown    Course Start Date 04/29/2023  2:20 PM    Session Number 12    Course First Treatment Date 05/11/2023  1:24 PM    Course Last Treatment Date 05/27/2023  1:07 PM    Course Elapsed Days 16    Reference Point ID Prostate    Reference Point Dosage Given to Date 30 Gy   Reference Point Session Dosage Given 2.5 Gy   Plan ID Prostate    Plan Name 23fx    Plan Fractions Treated to Date 12    Plan Total Fractions Prescribed 28    Plan Prescribed Dose Per Fraction 2.5 Gy   Plan Total Prescribed Dose 70.000000 Gy   Plan Primary Reference Point Prostate   Rad Onc [facility]ria Session Summary     Status: None   Collection Time: 05/28/23 11:52 AM  Result Value Ref Range   Course ID [email or web address]    Course Intent Unknown    Course Start Date 04/29/2023  2:20 PM    Session Number 13    Course First Treatment Date 05/11/2023  1:24 PM    Course Last  Treatment Date 05/28/2023 11:50 AM    Course Elapsed Days 17    Reference Point ID Prostate    Reference Point Dosage Given to Date 32.5 Gy   Reference Point Session Dosage Given 2.5 Gy   Plan ID Prostate    Plan Name 64fx    Plan Fractions Treated to Date 29    Plan Total Fractions Prescribed 28    Plan Prescribed Dose Per Fraction 2.5 Gy   Plan Total Prescribed Dose 70.000000 Gy   Plan Primary Reference Point Prostate   CBC (Cancer Center Only)     Status: Abnormal   Collection Time: 05/31/23 12:46 PM  Result Value Ref Range   WBC Count 3.5 (L) 4.0 - 10.5 K/uL   RBC 3.70 (L) 4.22 - 5.81 MIL/uL   Hemoglobin 10.2 (L) 13.0 - 17.0 g/dL   HCT 01.0 (L) 27.2 - 53.6 %   MCV 86.8 80.0 - 100.0 fL   MCH 27.6 26.0 - 34.0 pg   MCHC 31.8 30.0 - 36.0 g/dL   RDW 64.4 03.4 - 74.2 %   Platelet Count 169 150 - 400 K/uL   nRBC 0.0 0.0 - 0.2 %    Comment: Performed at [facility], [address], [place], [place] [address]  Rad Jeralene Huff Session Summary     Status: None   Collection Time: 05/31/23  1:11 PM  Result Value Ref Range   Course ID [email or web address]    Course Intent Unknown    Course Start Date 04/29/2023  2:20 PM    Session Number 14    Course First Treatment Date 05/11/2023  1:24 PM    Course Last Treatment Date 05/31/2023  1:09 PM    Course Elapsed Days 20    Reference Point ID Prostate  Reference Point Dosage Given to Date 35 Gy   Reference Point Session Dosage Given 2.5 Gy   Plan ID Prostate    Plan Name 81fx    Plan Fractions Treated to Date 14    Plan Total Fractions Prescribed 28    Plan Prescribed Dose Per Fraction 2.5 Gy   Plan Total Prescribed Dose 70.000000 Gy   Plan Primary Reference Point Prostate   Rad Jeralene Huff Session Summary     Status: None   Collection Time: 06/01/23  1:24 PM  Result Value Ref Range   Course ID [email or web address]    Course Intent Unknown    Course Start Date 04/29/2023  2:20 PM    Session Number 15    Course First Treatment Date 05/11/2023  1:24  PM    Course Last Treatment Date 06/01/2023  1:22 PM    Course Elapsed Days 21    Reference Point ID Prostate    Reference Point Dosage Given to Date 37.5 Gy   Reference Point Session Dosage Given 2.5 Gy   Plan ID Prostate    Plan Name 80fx    Plan Fractions Treated to Date 15    Plan Total Fractions Prescribed 28    Plan Prescribed Dose Per Fraction 2.5 Gy   Plan Total Prescribed Dose 70.000000 Gy   Plan Primary Reference Point Prostate   Rad Jeralene Huff Session Summary     Status: None   Collection Time: 06/02/23  1:08 PM  Result Value Ref Range   Course ID [email or web address]    Course Intent Unknown    Course Start Date 04/29/2023  2:20 PM    Session Number 16    Course First Treatment Date 05/11/2023  1:24 PM    Course Last Treatment Date 06/02/2023  1:06 PM    Course Elapsed Days 22    Reference Point ID Prostate    Reference Point Dosage Given to Date 40 Gy   Reference Point Session Dosage Given 2.5 Gy   Plan ID Prostate    Plan Name 79fx    Plan Fractions Treated to Date 65    Plan Total Fractions Prescribed 28    Plan Prescribed Dose Per Fraction 2.5 Gy   Plan Total Prescribed Dose 70.000000 Gy   Plan Primary Reference Point Prostate   Rad Jeralene Huff Session Summary     Status: None   Collection Time: 06/03/23  1:06 PM  Result Value Ref Range   Course ID [email or web address]    Course Intent Unknown    Course Start Date 04/29/2023  2:20 PM    Session Number 17    Course First Treatment Date 05/11/2023  1:24 PM    Course Last Treatment Date 06/03/2023  1:05 PM    Course Elapsed Days 23    Reference Point ID Prostate    Reference Point Dosage Given to Date 42.5 Gy   Reference Point Session Dosage Given 2.5 Gy   Plan ID Prostate    Plan Name 87fx    Plan Fractions Treated to Date 17    Plan Total Fractions Prescribed 28    Plan Prescribed Dose Per Fraction 2.5 Gy   Plan Total Prescribed Dose 70.000000 Gy   Plan Primary Reference Point Prostate   Rad Jeralene Huff Session Summary      Status: None   Collection Time: 06/04/23 12:38 PM  Result Value Ref Range   Course ID [email or web address]    Course Intent Unknown    Course Start Date  04/29/2023  2:20 PM    Session Number 18    Course First Treatment Date 05/11/2023  1:24 PM    Course Last Treatment Date 06/04/2023 12:36 PM    Course Elapsed Days 24    Reference Point ID Prostate    Reference Point Dosage Given to Date 45 Gy   Reference Point Session Dosage Given 2.5 Gy   Plan ID Prostate    Plan Name 39fx    Plan Fractions Treated to Date 18    Plan Total Fractions Prescribed 28    Plan Prescribed Dose Per Fraction 2.5 Gy   Plan Total Prescribed Dose 70.000000 Gy   Plan Primary Reference Point Prostate   Rad Jeralene Huff Session Summary     Status: None   Collection Time: 06/07/23  1:06 PM  Result Value Ref Range   Course ID [email or web address]    Course Intent Unknown    Course Start Date 04/29/2023  2:20 PM    Session Number 19    Course First Treatment Date 05/11/2023  1:24 PM    Course Last Treatment Date 06/07/2023  1:03 PM    Course Elapsed Days 27    Reference Point ID Prostate    Reference Point Dosage Given to Date 47.5 Gy   Reference Point Session Dosage Given 2.5 Gy   Plan ID Prostate    Plan Name 78fx    Plan Fractions Treated to Date 51    Plan Total Fractions Prescribed 28    Plan Prescribed Dose Per Fraction 2.5 Gy   Plan Total Prescribed Dose 70.000000 Gy   Plan Primary Reference Point Prostate   Rad Jeralene Huff Session Summary     Status: None   Collection Time: 06/08/23  1:13 PM  Result Value Ref Range   Course ID [email or web address]    Course Intent Unknown    Course Start Date 04/29/2023  2:20 PM    Session Number 20    Course First Treatment Date 05/11/2023  1:24 PM    Course Last Treatment Date 06/08/2023  1:11 PM    Course Elapsed Days 28    Reference Point ID Prostate    Reference Point Dosage Given to Date 50 Gy   Reference Point Session Dosage Given 2.5 Gy   Plan ID Prostate    Plan Name 70fx    Plan  Fractions Treated to Date 20    Plan Total Fractions Prescribed 28    Plan Prescribed Dose Per Fraction 2.5 Gy   Plan Total Prescribed Dose 70.000000 Gy   Plan Primary Reference Point Prostate   Rad Jeralene Huff Session Summary     Status: None   Collection Time: 06/09/23  1:10 PM  Result Value Ref Range   Course ID [email or web address]    Course Intent Unknown    Course Start Date 04/29/2023  2:20 PM    Session Number 21    Course First Treatment Date 05/11/2023  1:24 PM    Course Last Treatment Date 06/09/2023  1:08 PM    Course Elapsed Days 29    Reference Point ID Prostate    Reference Point Dosage Given to Date 52.5 Gy   Reference Point Session Dosage Given 2.5 Gy   Plan ID Prostate    Plan Name 28fx    Plan Fractions Treated to Date 54    Plan Total Fractions Prescribed 28    Plan Prescribed Dose Per Fraction 2.5 Gy   Plan Total Prescribed Dose 70.000000 Gy  Plan Primary Reference Point Prostate   Rad Jeralene Huff Session Summary     Status: None   Collection Time: 06/10/23  1:11 PM  Result Value Ref Range   Course ID [email or web address]    Course Intent Unknown    Course Start Date 04/29/2023  2:20 PM    Session Number 22    Course First Treatment Date 05/11/2023  1:24 PM    Course Last Treatment Date 06/10/2023  1:09 PM    Course Elapsed Days 30    Reference Point ID Prostate    Reference Point Dosage Given to Date 55 Gy   Reference Point Session Dosage Given 2.5 Gy   Plan ID Prostate    Plan Name 59fx    Plan Fractions Treated to Date 22    Plan Total Fractions Prescribed 28    Plan Prescribed Dose Per Fraction 2.5 Gy   Plan Total Prescribed Dose 70.000000 Gy   Plan Primary Reference Point Prostate   Rad Jeralene Huff Session Summary     Status: None   Collection Time: 06/11/23 12:36 PM  Result Value Ref Range   Course ID [email or web address]    Course Intent Unknown    Course Start Date 04/29/2023  2:20 PM    Session Number 23    Course First Treatment Date 05/11/2023  1:24 PM    Course Last  Treatment Date 06/11/2023 12:34 PM    Course Elapsed Days 31    Reference Point ID Prostate    Reference Point Dosage Given to Date 57.5 Gy   Reference Point Session Dosage Given 2.5 Gy   Plan ID Prostate    Plan Name 43fx    Plan Fractions Treated to Date 23    Plan Total Fractions Prescribed 28    Plan Prescribed Dose Per Fraction 2.5 Gy   Plan Total Prescribed Dose 70.000000 Gy   Plan Primary Reference Point Prostate   Rad Jeralene Huff Session Summary     Status: None   Collection Time: 06/14/23  1:09 PM  Result Value Ref Range   Course ID [email or web address]    Course Intent Unknown    Course Start Date 04/29/2023  2:20 PM    Session Number 24    Course First Treatment Date 05/11/2023  1:24 PM    Course Last Treatment Date 06/14/2023  1:07 PM    Course Elapsed Days 34    Reference Point ID Prostate    Reference Point Dosage Given to Date 60 Gy   Reference Point Session Dosage Given 2.5 Gy   Plan ID Prostate    Plan Name 9fx    Plan Fractions Treated to Date 24    Plan Total Fractions Prescribed 28    Plan Prescribed Dose Per Fraction 2.5 Gy   Plan Total Prescribed Dose 70.000000 Gy   Plan Primary Reference Point Prostate   Rad Jeralene Huff Session Summary     Status: None   Collection Time: 06/15/23  1:13 PM  Result Value Ref Range   Course ID [email or web address]    Course Intent Unknown    Course Start Date 04/29/2023  2:20 PM    Session Number 25    Course First Treatment Date 05/11/2023  1:24 PM    Course Last Treatment Date 06/15/2023  1:11 PM    Course Elapsed Days 35    Reference Point ID Prostate    Reference Point Dosage Given to Date 62.5 Gy   Reference Point Session Dosage Given 2.5 Gy  Plan ID Prostate    Plan Name 83fx    Plan Fractions Treated to Date 25    Plan Total Fractions Prescribed 28    Plan Prescribed Dose Per Fraction 2.5 Gy   Plan Total Prescribed Dose 70.000000 Gy   Plan Primary Reference Point Prostate   Rad Jeralene Huff Session Summary     Status: None   Collection  Time: 06/16/23  1:09 PM  Result Value Ref Range   Course ID [email or web address]    Course Intent Unknown    Course Start Date 04/29/2023  2:20 PM    Session Number 26    Course First Treatment Date 05/11/2023  1:24 PM    Course Last Treatment Date 06/16/2023  1:07 PM    Course Elapsed Days 36    Reference Point ID Prostate    Reference Point Dosage Given to Date 65 Gy   Reference Point Session Dosage Given 2.5 Gy   Plan ID Prostate    Plan Name 40fx    Plan Fractions Treated to Date 26    Plan Total Fractions Prescribed 28    Plan Prescribed Dose Per Fraction 2.5 Gy   Plan Total Prescribed Dose 70.000000 Gy   Plan Primary Reference Point Prostate   Rad Jeralene Huff Session Summary     Status: None   Collection Time: 06/17/23  1:15 PM  Result Value Ref Range   Course ID [email or web address]    Course Intent Unknown    Course Start Date 04/29/2023  2:20 PM    Session Number 27    Course First Treatment Date 05/11/2023  1:24 PM    Course Last Treatment Date 06/17/2023  1:13 PM    Course Elapsed Days 37    Reference Point ID Prostate    Reference Point Dosage Given to Date 67.5 Gy   Reference Point Session Dosage Given 2.5 Gy   Plan ID Prostate    Plan Name 60fx    Plan Fractions Treated to Date 27    Plan Total Fractions Prescribed 28    Plan Prescribed Dose Per Fraction 2.5 Gy   Plan Total Prescribed Dose 70.000000 Gy   Plan Primary Reference Point Prostate   Rad Jeralene Huff Session Summary     Status: None   Collection Time: 06/18/23  9:40 AM  Result Value Ref Range   Course ID [email or web address]    Course Intent Unknown    Course Start Date 04/29/2023  2:20 PM    Session Number 28    Course First Treatment Date 05/11/2023  1:24 PM    Course Last Treatment Date 06/18/2023  9:38 AM    Course Elapsed Days 38    Reference Point ID Prostate    Reference Point Dosage Given to Date 70 Gy   Reference Point Session Dosage Given 2.5 Gy   Plan ID Prostate    Plan Name 17fx    Plan Fractions Treated to Date 28     Plan Total Fractions Prescribed 28    Plan Prescribed Dose Per Fraction 2.5 Gy   Plan Total Prescribed Dose 70.000000 Gy   Plan Primary Reference Point Prostate       Assessment & Plan:  Return for fasting lab work.  Refills sent to the pharmacy.   Problem List Items Addressed This Visit       Cardiovascular and Mediastinum   Hypertension - Primary     Other   Vitamin D deficiency   Mixed hyperlipidemia    Return  in about 6 months (around 12/29/2023).   Total time spent: 25 minutes  [employer], NP  07/01/2023   This document may have been prepared by Dragon Voice Recognition software and as such may include unintentional dictation errors.

## 2023-07-05 ENCOUNTER — Ambulatory Visit: Payer: Medicare PPO

## 2023-07-14 ENCOUNTER — Ambulatory Visit
Admission: RE | Admit: 2023-07-14 | Discharge: 2023-07-14 | Disposition: A | Discharge status: Home / Self Care | Payer: Medicare PPO | Source: Ambulatory Visit | Attending: Urology | Admitting: Urology

## 2023-07-14 DIAGNOSIS — Z905 Acquired absence of kidney: Secondary | ICD-10-CM | POA: Diagnosis not present

## 2023-07-14 DIAGNOSIS — C642 Malignant neoplasm of left kidney, except renal pelvis: Secondary | ICD-10-CM | POA: Insufficient documentation

## 2023-07-14 DIAGNOSIS — C649 Malignant neoplasm of unspecified kidney, except renal pelvis: Secondary | ICD-10-CM | POA: Diagnosis not present

## 2023-07-14 DIAGNOSIS — J984 Other disorders of lung: Secondary | ICD-10-CM | POA: Diagnosis not present

## 2023-07-14 DIAGNOSIS — I771 Stricture of artery: Secondary | ICD-10-CM | POA: Diagnosis not present

## 2023-07-14 MED ORDER — IOHEXOL 300 MG/ML  SOLN
100.0000 mL | Freq: Once | INTRAMUSCULAR | Status: AC | PRN
  Administered 2023-07-14: 80 mL via INTRAVENOUS

## 2023-07-21 ENCOUNTER — Other Ambulatory Visit: Payer: Self-pay | Admitting: *Deleted

## 2023-07-21 ENCOUNTER — Ambulatory Visit
Admission: RE | Admit: 2023-07-21 | Discharge: 2023-07-21 | Disposition: A | Discharge status: Home / Self Care | Payer: Medicare PPO | Source: Ambulatory Visit | Attending: Radiation Oncology | Admitting: Radiation Oncology

## 2023-07-21 ENCOUNTER — Encounter: Payer: Self-pay | Admitting: Radiation Oncology

## 2023-07-21 VITALS — BP 100/62 | HR 76 | Temp 97.2°F | Resp 16 | Wt 192.0 lb

## 2023-07-21 DIAGNOSIS — C61 Malignant neoplasm of prostate: Secondary | ICD-10-CM

## 2023-07-21 NOTE — Progress Notes (Signed)
Radiation Oncology Follow up Note  Name: Michael Hinton.   Date:   07/21/2023 MRN:  [id number] DOB: Nov 19, 1950    This 72 y.o. 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  REFERRING PROVIDER: Orson Eva, NP  HPI: The patient, a 72 year old with a history of stage 2C (T1C, N0, M0) Gleason 7 (4+3) adenocarcinoma of the prostate, completed IMRT radiation therapy one month ago. He presented initially with with a PSA of 9. He reports feeling generally well, but has been experiencing itching on his back for the past couple of weeks. The itching does not appear to be related to hydration status. He admits to not taking his prescribed Flomax as directed..  Does have nocturia x 4-5.  COMPLICATIONS OF TREATMENT: none  FOLLOW UP COMPLIANCE: keeps appointments   PHYSICAL EXAM:  BP 100/62   Pulse 76   Temp (!) 97.2 F (36.2 C) (Tympanic)   Resp 16   Wt 192 lb (87.1 kg)   BMI 26.78 kg/m  Well-developed well-nourished patient in NAD. HEENT reveals PERLA, EOMI, discs not visualized.  Oral cavity is clear. No oral mucosal lesions are identified. Neck is clear without evidence of cervical or supraclavicular adenopathy. Lungs are clear to A&P. Cardiac examination is essentially unremarkable with regular rate and rhythm without murmur rub or thrill. Abdomen is benign with no organomegaly or masses noted. Motor sensory and DTR levels are equal and symmetric in the upper and lower extremities. Cranial nerves II through XII are grossly intact. Proprioception is intact. No peripheral adenopathy or edema is identified. No motor or sensory levels are noted. Crude visual fields are within normal range.  RADIOLOGY RESULTS: No current films to review  PLAN: Prostate Cancer (Stage II, Gleason 7 (4+3), T1C, N0, M0) Completed IMRT radiation therapy one month ago.  -Plan for follow-up in three months with  a PSA blood test prior to the visit to monitor response to treatment.  Urinary Symptoms Reports weak stream and nocturia (5-6 times per night). Non-compliant with Flomax. -Advised to take Flomax nightly to improve symptoms.  Pruritus Reports itching on the back for a couple of weeks. No rash noted. -Recommended Benadryl gel for symptomatic relief. -Advised to monitor for any rash.    Carmina Miller, MD

## 2023-07-26 ENCOUNTER — Telehealth: Payer: Self-pay | Admitting: *Deleted

## 2023-07-26 NOTE — Telephone Encounter (Signed)
Patient wife called reporting that patient is itching worse than he wa sand that he feels the Tamsulosin is causing him to have to get up more at night to void. Please return her call to discuss

## 2023-07-26 NOTE — Telephone Encounter (Signed)
Called patient regarding itching and urinary frequency spoke with wife. Advised her to continue Flomax for at least two more weeks for effectiveness. Use Benadryl for itching and if not effective see PCP.

## 2023-07-27 ENCOUNTER — Other Ambulatory Visit: Payer: Self-pay

## 2023-07-27 ENCOUNTER — Encounter: Payer: Self-pay | Admitting: Cardiology

## 2023-07-27 ENCOUNTER — Ambulatory Visit (INDEPENDENT_AMBULATORY_CARE_PROVIDER_SITE_OTHER): Payer: Medicaid Other | Admitting: Cardiology

## 2023-07-27 VITALS — BP 106/60 | HR 83 | Ht 71.0 in | Wt 190.4 lb

## 2023-07-27 DIAGNOSIS — L299 Pruritus, unspecified: Secondary | ICD-10-CM | POA: Insufficient documentation

## 2023-07-27 DIAGNOSIS — Z0130 Encounter for examination of blood pressure without abnormal findings: Secondary | ICD-10-CM

## 2023-07-27 MED ORDER — TRIAMCINOLONE ACETONIDE 0.5 % EX OINT: 1.0000 | TOPICAL_OINTMENT | Freq: Two times a day (BID) | CUTANEOUS | 3 refills | Status: DC

## 2023-07-27 NOTE — Progress Notes (Signed)
Established Patient Office Visit  Subjective:  Patient ID: Michael Vinyard., male    DOB: 1950-09-28  Age: 72 y.o. MRN: [id number]  Chief Complaint  Patient presents with   Acute Visit    Neck and back itching for 2-3 weeks. No rash, dry skin.     Patient in office for an acute visit, complaining of neck and back itching for 2-3 weeks. Denies rash. Patient states itching worse when drinking water, improves with drinking Gatorade. Patient has used Benadryl which helps the itching until it wears off. Using lotion after showering. Showers are not hot water per patient. Will send in Kenalog cream.     No other concerns at this time.   Past Medical History:  Diagnosis Date   Anemia    Aortic atherosclerosis (HCC)    Atrial fibrillation (HCC)    a.) CHA2DS2VASc = 5 (age, HTN, CVA x 2, vascular disease history);  b.) rate/rhythm maintained without pharmacological intervention; chronically anticoagulated with clopidogrel   Bilateral renal cysts 05/29/2022   Carotid artery disease (HCC) 04/11/2022   a.) doppler 04/11/2022: <50 LICA, no sig RICA.   CVA (cerebral vascular accident) [facility])    a.) MRI brain 01/14/2022: numerous chronic cerebellar infarcts; b.) CT head 04/10/2022: small old lacunar infarcts are seen in cerebellum bilaterally   Diastolic dysfunction 04/11/2022   a.) TTE 04/11/2022: EF 55-60%, mild BAE, mild MR, G1DD.   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   T2DM (type 2 diabetes mellitus) (HCC)    Thoracic ascending aortic aneurysm (HCC) 06/01/2022   a.) CT chest - measured 4.2 cm    Past Surgical History:  Procedure Laterality Date   LAPAROSCOPIC NEPHRECTOMY, HAND ASSISTED Left 07/13/2022   Procedure: HAND ASSISTED LAPAROSCOPIC RADICAL NEPHRECTOMY;  Surgeon: Vanna Scotland, MD;  Location: [facility] ORS;  Service: Urology;  Laterality: Left;   RENAL BIOPSY  05/2022    Social History   Socioeconomic History   Marital status: Married    Spouse name: Felecia   Number of children: Not on file   Years of education: Not on file   Highest education level: Not on file  Occupational History   Not on file  Tobacco Use   Smoking status: Former    Current packs/day: 0.00    Average packs/day: 0.5 packs/day for 7.0 years (3.5 ttl pk-yrs)    Types: Cigarettes    Start date: 03/16/1971    Quit date: 03/15/1978    Years since quitting: 45.3    Passive exposure: Past   Smokeless tobacco: Never  Vaping Use   Vaping status: Never Used  Substance and Sexual Activity   Alcohol use: Not Currently   Drug use: No   Sexual activity: Yes  Other Topics Concern   Not on file  Social History Narrative   Not on file   Social Drivers of Health  Financial Resource Strain: Not on file  Food Insecurity: Not on file  Transportation Needs: Not on file  Physical Activity: Not on file  Stress: Not on file  Social Connections: Not on file  Intimate Partner Violence: Not on file    Family History  Problem Relation Age of Onset   Heart disease Mother    Diabetes Mother    Breast cancer Mother    Kidney disease Father    Diabetes Father    Diabetes Daughter     No Known Allergies  Outpatient Medications Prior to Visit  Medication Sig   amLODipine (NORVASC) 10 MG tablet Take 1 tablet (10 mg total) by mouth every morning.   Cholecalciferol (VITAMIN D-3) 125 MCG (5000 UT) TABS Take 1 tablet by mouth  daily.   Ferrous Sulfate (IRON PO) Take 1 tablet by mouth daily.   folic acid (FOLVITE) 1 MG tablet Take 1 tablet (1 mg total) by mouth daily.   losartan (COZAAR) 50 MG tablet Take 1 tablet (50 mg total) by mouth daily.   predniSONE (DELTASONE) 1 MG tablet Take 4 tablets (4 mg total) by mouth daily with breakfast. (Patient taking differently: Take 1 mg by mouth daily with breakfast.)   rosuvastatin (CRESTOR) 40 MG tablet Take 1 tablet (40 mg total) by mouth daily.   tamsulosin (FLOMAX) 0.4 MG CAPS capsule Take 1 capsule (0.4 mg total) by mouth daily after supper.   Abatacept (ORENCIA CLICKJECT) 125 MG/ML SOAJ Inject 125 mg into the skin every 14 (fourteen) days. (Patient not taking: Reported on 07/27/2023)   No facility-administered medications prior to visit.    Review of Systems  Constitutional: Negative.   HENT: Negative.    Eyes: Negative.   Respiratory: Negative.  Negative for shortness of breath.   Cardiovascular: Negative.  Negative for chest pain.  Gastrointestinal: Negative.  Negative for abdominal pain, constipation and diarrhea.  Genitourinary: Negative.   Musculoskeletal:  Negative for joint pain and myalgias.  Skin:  Positive for itching.  Neurological: Negative.  Negative for dizziness and headaches.  Endo/Heme/Allergies: Negative.   All other systems reviewed and are negative.      Objective:   BP 106/60   Pulse 83   Ht 5\' 11"  (1.803 m)   Wt 190 lb 6.4 oz (86.4 kg)   SpO2 97%   BMI 26.56 kg/m   Vitals:   07/27/23 1416  BP: 106/60  Pulse: 83  Height: 5\' 11"  (1.803 m)  Weight: 190 lb 6.4 oz (86.4 kg)  SpO2: 97%  BMI (Calculated): 26.57    Physical Exam Nursing note reviewed.  Constitutional:      Appearance: Normal appearance. He is normal weight.  HENT:     Head: Normocephalic and atraumatic.     Nose: Nose normal.     Mouth/Throat:     Mouth: Mucous membranes are moist.     Pharynx: Oropharynx is clear.  Eyes:     Extraocular Movements:  Extraocular movements intact.     Conjunctiva/sclera: Conjunctivae normal.     Pupils: Pupils are equal, round, and reactive to light.  Cardiovascular:     Rate and Rhythm: Normal rate and regular rhythm.     Pulses: Normal pulses.     Heart sounds: Normal heart sounds.  Pulmonary:     Effort: Pulmonary effort is normal.     Breath sounds: Normal breath sounds.  Abdominal:     General: Abdomen is flat. Bowel sounds are normal.     Palpations: Abdomen  is soft.  Musculoskeletal:        General: Normal range of motion.     Cervical back: Normal range of motion.  Skin:    General: Skin is warm and dry.  Neurological:     General: No focal deficit present.     Mental Status: He is alert and oriented to person, place, and time.  Psychiatric:        Mood and Affect: Mood normal.        Behavior: Behavior normal.        Thought Content: Thought content normal.        Judgment: Judgment normal.      No results found for any visits on 07/27/23.  Recent Results (from the past 2160 hours)  Rad Onc [facility] Session Summary     Status: None   Collection Time: 05/11/23  1:27 PM  Result Value Ref Range   Course ID [email or web address]    Course Intent Unknown    Course Start Date 04/29/2023  2:20 PM    Session Number 1    Course First Treatment Date 05/11/2023  1:24 PM    Course Last Treatment Date 05/11/2023  1:25 PM    Course Elapsed Days 0    Reference Point ID Prostate    Reference Point Dosage Given to Date 2.5 Gy   Reference Point Session Dosage Given 2.5 Gy   Plan ID Prostate    Plan Name 65fx    Plan Fractions Treated to Date 1    Plan Total Fractions Prescribed 28    Plan Prescribed Dose Per Fraction 2.5 Gy   Plan Total Prescribed Dose 70.000000 Gy   Plan Primary Reference Point Prostate   Rad Jeralene Huff Session Summary     Status: None   Collection Time: 05/13/23  2:13 PM  Result Value Ref Range   Course ID [email or web address]    Course Intent Unknown    Course Start Date 04/29/2023  2:20 PM     Session Number 2    Course First Treatment Date 05/11/2023  1:24 PM    Course Last Treatment Date 05/13/2023  2:11 PM    Course Elapsed Days 2    Reference Point ID Prostate    Reference Point Dosage Given to Date 5 Gy   Reference Point Session Dosage Given 2.5 Gy   Plan ID Prostate    Plan Name 75fx    Plan Fractions Treated to Date 2    Plan Total Fractions Prescribed 28    Plan Prescribed Dose Per Fraction 2.5 Gy   Plan Total Prescribed Dose 70.000000 Gy   Plan Primary Reference Point Prostate   Rad Jeralene Huff Session Summary     Status: None   Collection Time: 05/14/23  3:32 PM  Result Value Ref Range   Course ID [email or web address]    Course Intent Unknown    Course Start Date 04/29/2023  2:20 PM    Session Number 3    Course First Treatment Date 05/11/2023  1:24 PM    Course Last Treatment Date 05/14/2023  3:30 PM    Course Elapsed Days 3    Reference Point ID Prostate    Reference Point Dosage Given to Date 7.5 Gy   Reference Point Session Dosage Given 2.5 Gy   Plan ID Prostate    Plan Name 80fx    Plan Fractions Treated to Date 3    Plan Total Fractions Prescribed 28    Plan Prescribed Dose Per Fraction  2.5 Gy   Plan Total Prescribed Dose 70.000000 Gy   Plan Primary Reference Point Prostate   Rad Onc Aria Session Summary     Status: None   Collection Time: 05/17/23  1:18 PM  Result Value Ref Range   Course ID [email or web address]    Course Intent Unknown    Course Start Date 04/29/2023  2:20 PM    Session Number 4    Course First Treatment Date 05/11/2023  1:24 PM    Course Last Treatment Date 05/17/2023  1:16 PM    Course Elapsed Days 6    Reference Point ID Prostate    Reference Point Dosage Given to Date 10 Gy   Reference Point Session Dosage Given 2.5 Gy   Plan ID Prostate    Plan Name 22fx    Plan Fractions Treated to Date 4    Plan Total Fractions Prescribed 28    Plan Prescribed Dose Per Fraction 2.5 Gy   Plan Total Prescribed Dose 70.000000 Gy   Plan Primary Reference  Point Prostate   Rad Jeralene Huff Session Summary     Status: None   Collection Time: 05/18/23  1:13 PM  Result Value Ref Range   Course ID [email or web address]    Course Intent Unknown    Course Start Date 04/29/2023  2:20 PM    Session Number 5    Course First Treatment Date 05/11/2023  1:24 PM    Course Last Treatment Date 05/18/2023  1:10 PM    Course Elapsed Days 7    Reference Point ID Prostate    Reference Point Dosage Given to Date 12.5 Gy   Reference Point Session Dosage Given 2.5 Gy   Plan ID Prostate    Plan Name 37fx    Plan Fractions Treated to Date 5    Plan Total Fractions Prescribed 28    Plan Prescribed Dose Per Fraction 2.5 Gy   Plan Total Prescribed Dose 70.000000 Gy   Plan Primary Reference Point Prostate   Rad Jeralene Huff Session Summary     Status: None   Collection Time: 05/19/23  1:08 PM  Result Value Ref Range   Course ID [email or web address]    Course Intent Unknown    Course Start Date 04/29/2023  2:20 PM    Session Number 6    Course First Treatment Date 05/11/2023  1:24 PM    Course Last Treatment Date 05/19/2023  1:06 PM    Course Elapsed Days 8    Reference Point ID Prostate    Reference Point Dosage Given to Date 15 Gy   Reference Point Session Dosage Given 2.5 Gy   Plan ID Prostate    Plan Name 55fx    Plan Fractions Treated to Date 6    Plan Total Fractions Prescribed 28    Plan Prescribed Dose Per Fraction 2.5 Gy   Plan Total Prescribed Dose 70.000000 Gy   Plan Primary Reference Point Prostate   Rad Jeralene Huff Session Summary     Status: None   Collection Time: 05/20/23  1:08 PM  Result Value Ref Range   Course ID [email or web address]    Course Intent Unknown    Course Start Date 04/29/2023  2:20 PM    Session Number 7    Course First Treatment Date 05/11/2023  1:24 PM    Course Last Treatment Date 05/20/2023  1:06 PM    Course Elapsed Days 9    Reference Point ID Prostate    Reference Point Dosage Given  to Date 17.5 Gy   Reference Point Session Dosage Given 2.5 Gy   Plan  ID Prostate    Plan Name 76fx    Plan Fractions Treated to Date 7    Plan Total Fractions Prescribed 28    Plan Prescribed Dose Per Fraction 2.5 Gy   Plan Total Prescribed Dose 70.000000 Gy   Plan Primary Reference Point Prostate   Rad Jeralene Huff Session Summary     Status: None   Collection Time: 05/21/23  1:08 PM  Result Value Ref Range   Course ID [email or web address]    Course Intent Unknown    Course Start Date 04/29/2023  2:20 PM    Session Number 8    Course First Treatment Date 05/11/2023  1:24 PM    Course Last Treatment Date 05/21/2023  1:05 PM    Course Elapsed Days 10    Reference Point ID Prostate    Reference Point Dosage Given to Date 20 Gy   Reference Point Session Dosage Given 2.5 Gy   Plan ID Prostate    Plan Name 74fx    Plan Fractions Treated to Date 8    Plan Total Fractions Prescribed 28    Plan Prescribed Dose Per Fraction 2.5 Gy   Plan Total Prescribed Dose 70.000000 Gy   Plan Primary Reference Point Prostate   Rad Jeralene Huff Session Summary     Status: None   Collection Time: 05/24/23  1:09 PM  Result Value Ref Range   Course ID [email or web address]    Course Intent Unknown    Course Start Date 04/29/2023  2:20 PM    Session Number 9    Course First Treatment Date 05/11/2023  1:24 PM    Course Last Treatment Date 05/24/2023  1:06 PM    Course Elapsed Days 13    Reference Point ID Prostate    Reference Point Dosage Given to Date 22.5 Gy   Reference Point Session Dosage Given 2.5 Gy   Plan ID Prostate    Plan Name 52fx    Plan Fractions Treated to Date 9    Plan Total Fractions Prescribed 28    Plan Prescribed Dose Per Fraction 2.5 Gy   Plan Total Prescribed Dose 70.000000 Gy   Plan Primary Reference Point Prostate   Rad Jeralene Huff Session Summary     Status: None   Collection Time: 05/25/23  1:14 PM  Result Value Ref Range   Course ID [email or web address]    Course Intent Unknown    Course Start Date 04/29/2023  2:20 PM    Session Number 10    Course First Treatment Date  05/11/2023  1:24 PM    Course Last Treatment Date 05/25/2023  1:12 PM    Course Elapsed Days 14    Reference Point ID Prostate    Reference Point Dosage Given to Date 25 Gy   Reference Point Session Dosage Given 2.5 Gy   Plan ID Prostate    Plan Name 47fx    Plan Fractions Treated to Date 10    Plan Total Fractions Prescribed 28    Plan Prescribed Dose Per Fraction 2.5 Gy   Plan Total Prescribed Dose 70.000000 Gy   Plan Primary Reference Point Prostate   Rad Jeralene Huff Session Summary     Status: None   Collection Time: 05/26/23  1:08 PM  Result Value Ref Range   Course ID [email or web address]    Course Intent Unknown    Course Start Date 04/29/2023  2:20  PM    Session Number 11    Course First Treatment Date 05/11/2023  1:24 PM    Course Last Treatment Date 05/26/2023  1:06 PM    Course Elapsed Days 15    Reference Point ID Prostate    Reference Point Dosage Given to Date 27.5 Gy   Reference Point Session Dosage Given 2.5 Gy   Plan ID Prostate    Plan Name 32fx    Plan Fractions Treated to Date 34    Plan Total Fractions Prescribed 28    Plan Prescribed Dose Per Fraction 2.5 Gy   Plan Total Prescribed Dose 70.000000 Gy   Plan Primary Reference Point Prostate   Rad On[facility] Session Summary     Status: None   Collection Time: 05/27/23  1:10 PM  Result Value Ref Range   Course ID [email or web address]    Course Intent Unknown    Course Start Date 04/29/2023  2:20 PM    Session Number 12    Course First Treatment Date 05/11/2023  1:24 PM    Course Last Treatment Date 05/27/2023  1:07 PM    Course Elapsed Days 16    Reference Point ID Prostate    Reference Point Dosage Given to Date 30 Gy   Reference Point Session Dosage Given 2.5 Gy   Plan ID Prostate    Plan Name 52fx    Plan Fractions Treated to Date 12    Plan Total Fractions Prescribed 28    Plan Prescribed Dose Per Fraction 2.5 Gy   Plan Total Prescribed Dose 70.000000 Gy   Plan Primary Reference Point Prostate   Rad Onc [facility]ria Session  Summary     Status: None   Collection Time: 05/28/23 11:52 AM  Result Value Ref Range   Course ID [email or web address]    Course Intent Unknown    Course Start Date 04/29/2023  2:20 PM    Session Number 13    Course First Treatment Date 05/11/2023  1:24 PM    Course Last Treatment Date 05/28/2023 11:50 AM    Course Elapsed Days 17    Reference Point ID Prostate    Reference Point Dosage Given to Date 32.5 Gy   Reference Point Session Dosage Given 2.5 Gy   Plan ID Prostate    Plan Name 13fx    Plan Fractions Treated to Date 13    Plan Total Fractions Prescribed 28    Plan Prescribed Dose Per Fraction 2.5 Gy   Plan Total Prescribed Dose 70.000000 Gy   Plan Primary Reference Point Prostate   CBC (Cancer Center Only)     Status: Abnormal   Collection Time: 05/31/23 12:46 PM  Result Value Ref Range   WBC Count 3.5 (L) 4.0 - 10.5 K/uL   RBC 3.70 (L) 4.22 - 5.81 MIL/uL   Hemoglobin 10.2 (L) 13.0 - 17.0 g/dL   HCT 78.4 (L) 69.6 - 29.5 %   MCV 86.8 80.0 - 100.0 fL   MCH 27.6 26.0 - 34.0 pg   MCHC 31.8 30.0 - 36.0 g/dL   RDW 28.4 13.2 - 44.0 %   Platelet Count 169 150 - 400 K/uL   nRBC 0.0 0.0 - 0.2 %    Comment: Performed at [facility], [address] [place], [place] [address]  Rad Jeralene Huff Session Summary     Status: None   Collection Time: 05/31/23  1:11 PM  Result Value Ref Range   Course ID [email or web address]    Course Intent  Unknown    Course Start Date 04/29/2023  2:20 PM    Session Number 14    Course First Treatment Date 05/11/2023  1:24 PM    Course Last Treatment Date 05/31/2023  1:09 PM    Course Elapsed Days 20    Reference Point ID Prostate    Reference Point Dosage Given to Date 35 Gy   Reference Point Session Dosage Given 2.5 Gy   Plan ID Prostate    Plan Name 32fx    Plan Fractions Treated to Date 14    Plan Total Fractions Prescribed 28    Plan Prescribed Dose Per Fraction 2.5 Gy   Plan Total Prescribed Dose 70.000000 Gy   Plan Primary Reference Point Prostate    Rad Jeralene Huff Session Summary     Status: None   Collection Time: 06/01/23  1:24 PM  Result Value Ref Range   Course ID [email or web address]    Course Intent Unknown    Course Start Date 04/29/2023  2:20 PM    Session Number 15    Course First Treatment Date 05/11/2023  1:24 PM    Course Last Treatment Date 06/01/2023  1:22 PM    Course Elapsed Days 21    Reference Point ID Prostate    Reference Point Dosage Given to Date 37.5 Gy   Reference Point Session Dosage Given 2.5 Gy   Plan ID Prostate    Plan Name 50fx    Plan Fractions Treated to Date 15    Plan Total Fractions Prescribed 28    Plan Prescribed Dose Per Fraction 2.5 Gy   Plan Total Prescribed Dose 70.000000 Gy   Plan Primary Reference Point Prostate   Rad Jeralene Huff Session Summary     Status: None   Collection Time: 06/02/23  1:08 PM  Result Value Ref Range   Course ID [email or web address]    Course Intent Unknown    Course Start Date 04/29/2023  2:20 PM    Session Number 16    Course First Treatment Date 05/11/2023  1:24 PM    Course Last Treatment Date 06/02/2023  1:06 PM    Course Elapsed Days 22    Reference Point ID Prostate    Reference Point Dosage Given to Date 40 Gy   Reference Point Session Dosage Given 2.5 Gy   Plan ID Prostate    Plan Name 19fx    Plan Fractions Treated to Date 38    Plan Total Fractions Prescribed 28    Plan Prescribed Dose Per Fraction 2.5 Gy   Plan Total Prescribed Dose 70.000000 Gy   Plan Primary Reference Point Prostate   Rad Jeralene Huff Session Summary     Status: None   Collection Time: 06/03/23  1:06 PM  Result Value Ref Range   Course ID [email or web address]    Course Intent Unknown    Course Start Date 04/29/2023  2:20 PM    Session Number 17    Course First Treatment Date 05/11/2023  1:24 PM    Course Last Treatment Date 06/03/2023  1:05 PM    Course Elapsed Days 23    Reference Point ID Prostate    Reference Point Dosage Given to Date 42.5 Gy   Reference Point Session Dosage Given 2.5 Gy   Plan ID  Prostate    Plan Name 22fx    Plan Fractions Treated to Date 17    Plan Total Fractions Prescribed 28    Plan Prescribed Dose Per Fraction 2.5 Gy  Plan Total Prescribed Dose 70.000000 Gy   Plan Primary Reference Point Prostate   Rad Onc Aria Session Summary     Status: None   Collection Time: 06/04/23 12:38 PM  Result Value Ref Range   Course ID [email or web address]    Course Intent Unknown    Course Start Date 04/29/2023  2:20 PM    Session Number 18    Course First Treatment Date 05/11/2023  1:24 PM    Course Last Treatment Date 06/04/2023 12:36 PM    Course Elapsed Days 24    Reference Point ID Prostate    Reference Point Dosage Given to Date 45 Gy   Reference Point Session Dosage Given 2.5 Gy   Plan ID Prostate    Plan Name 62fx    Plan Fractions Treated to Date 18    Plan Total Fractions Prescribed 28    Plan Prescribed Dose Per Fraction 2.5 Gy   Plan Total Prescribed Dose 70.000000 Gy   Plan Primary Reference Point Prostate   Rad Jeralene Huff Session Summary     Status: None   Collection Time: 06/07/23  1:06 PM  Result Value Ref Range   Course ID [email or web address]    Course Intent Unknown    Course Start Date 04/29/2023  2:20 PM    Session Number 19    Course First Treatment Date 05/11/2023  1:24 PM    Course Last Treatment Date 06/07/2023  1:03 PM    Course Elapsed Days 27    Reference Point ID Prostate    Reference Point Dosage Given to Date 47.5 Gy   Reference Point Session Dosage Given 2.5 Gy   Plan ID Prostate    Plan Name 58fx    Plan Fractions Treated to Date 37    Plan Total Fractions Prescribed 28    Plan Prescribed Dose Per Fraction 2.5 Gy   Plan Total Prescribed Dose 70.000000 Gy   Plan Primary Reference Point Prostate   Rad Jeralene Huff Session Summary     Status: None   Collection Time: 06/08/23  1:13 PM  Result Value Ref Range   Course ID [email or web address]    Course Intent Unknown    Course Start Date 04/29/2023  2:20 PM    Session Number 20    Course First Treatment Date  05/11/2023  1:24 PM    Course Last Treatment Date 06/08/2023  1:11 PM    Course Elapsed Days 28    Reference Point ID Prostate    Reference Point Dosage Given to Date 50 Gy   Reference Point Session Dosage Given 2.5 Gy   Plan ID Prostate    Plan Name 10fx    Plan Fractions Treated to Date 20    Plan Total Fractions Prescribed 28    Plan Prescribed Dose Per Fraction 2.5 Gy   Plan Total Prescribed Dose 70.000000 Gy   Plan Primary Reference Point Prostate   Rad Jeralene Huff Session Summary     Status: None   Collection Time: 06/09/23  1:10 PM  Result Value Ref Range   Course ID [email or web address]    Course Intent Unknown    Course Start Date 04/29/2023  2:20 PM    Session Number 21    Course First Treatment Date 05/11/2023  1:24 PM    Course Last Treatment Date 06/09/2023  1:08 PM    Course Elapsed Days 29    Reference Point ID Prostate    Reference Point Dosage Given to Date 52.5 Gy  Reference Point Session Dosage Given 2.5 Gy   Plan ID Prostate    Plan Name 30fx    Plan Fractions Treated to Date 21    Plan Total Fractions Prescribed 28    Plan Prescribed Dose Per Fraction 2.5 Gy   Plan Total Prescribed Dose 70.000000 Gy   Plan Primary Reference Point Prostate   Rad Jeralene Huff Session Summary     Status: None   Collection Time: 06/10/23  1:11 PM  Result Value Ref Range   Course ID [email or web address]    Course Intent Unknown    Course Start Date 04/29/2023  2:20 PM    Session Number 22    Course First Treatment Date 05/11/2023  1:24 PM    Course Last Treatment Date 06/10/2023  1:09 PM    Course Elapsed Days 30    Reference Point ID Prostate    Reference Point Dosage Given to Date 55 Gy   Reference Point Session Dosage Given 2.5 Gy   Plan ID Prostate    Plan Name 72fx    Plan Fractions Treated to Date 22    Plan Total Fractions Prescribed 28    Plan Prescribed Dose Per Fraction 2.5 Gy   Plan Total Prescribed Dose 70.000000 Gy   Plan Primary Reference Point Prostate   Rad Jeralene Huff Session  Summary     Status: None   Collection Time: 06/11/23 12:36 PM  Result Value Ref Range   Course ID [email or web address]    Course Intent Unknown    Course Start Date 04/29/2023  2:20 PM    Session Number 23    Course First Treatment Date 05/11/2023  1:24 PM    Course Last Treatment Date 06/11/2023 12:34 PM    Course Elapsed Days 31    Reference Point ID Prostate    Reference Point Dosage Given to Date 57.5 Gy   Reference Point Session Dosage Given 2.5 Gy   Plan ID Prostate    Plan Name 55fx    Plan Fractions Treated to Date 23    Plan Total Fractions Prescribed 28    Plan Prescribed Dose Per Fraction 2.5 Gy   Plan Total Prescribed Dose 70.000000 Gy   Plan Primary Reference Point Prostate   Rad Jeralene Huff Session Summary     Status: None   Collection Time: 06/14/23  1:09 PM  Result Value Ref Range   Course ID [email or web address]    Course Intent Unknown    Course Start Date 04/29/2023  2:20 PM    Session Number 24    Course First Treatment Date 05/11/2023  1:24 PM    Course Last Treatment Date 06/14/2023  1:07 PM    Course Elapsed Days 34    Reference Point ID Prostate    Reference Point Dosage Given to Date 60 Gy   Reference Point Session Dosage Given 2.5 Gy   Plan ID Prostate    Plan Name 50fx    Plan Fractions Treated to Date 24    Plan Total Fractions Prescribed 28    Plan Prescribed Dose Per Fraction 2.5 Gy   Plan Total Prescribed Dose 70.000000 Gy   Plan Primary Reference Point Prostate   Rad Jeralene Huff Session Summary     Status: None   Collection Time: 06/15/23  1:13 PM  Result Value Ref Range   Course ID [email or web address]    Course Intent Unknown    Course Start Date 04/29/2023  2:20 PM    Session Number 25  Course First Treatment Date 05/11/2023  1:24 PM    Course Last Treatment Date 06/15/2023  1:11 PM    Course Elapsed Days 35    Reference Point ID Prostate    Reference Point Dosage Given to Date 62.5 Gy   Reference Point Session Dosage Given 2.5 Gy   Plan ID Prostate    Plan Name 27fx     Plan Fractions Treated to Date 25    Plan Total Fractions Prescribed 28    Plan Prescribed Dose Per Fraction 2.5 Gy   Plan Total Prescribed Dose 70.000000 Gy   Plan Primary Reference Point Prostate   Rad Jeralene Huff Session Summary     Status: None   Collection Time: 06/16/23  1:09 PM  Result Value Ref Range   Course ID [email or web address]    Course Intent Unknown    Course Start Date 04/29/2023  2:20 PM    Session Number 26    Course First Treatment Date 05/11/2023  1:24 PM    Course Last Treatment Date 06/16/2023  1:07 PM    Course Elapsed Days 36    Reference Point ID Prostate    Reference Point Dosage Given to Date 65 Gy   Reference Point Session Dosage Given 2.5 Gy   Plan ID Prostate    Plan Name 2fx    Plan Fractions Treated to Date 26    Plan Total Fractions Prescribed 28    Plan Prescribed Dose Per Fraction 2.5 Gy   Plan Total Prescribed Dose 70.000000 Gy   Plan Primary Reference Point Prostate   Rad Jeralene Huff Session Summary     Status: None   Collection Time: 06/17/23  1:15 PM  Result Value Ref Range   Course ID [email or web address]_[email or web address]    Course Intent Unknown    Course Start Date 04/29/2023  2:20 PM    Session Number 27    Course First Treatment Date 05/11/2023  1:24 PM    Course Last Treatment Date 06/17/2023  1:13 PM    Course Elapsed Days 37    Reference Point ID Prostate    Reference Point Dosage Given to Date 67.5 Gy   Reference Point Session Dosage Given 2.5 Gy   Plan ID Prostate    Plan Name 9fx    Plan Fractions Treated to Date 27    Plan Total Fractions Prescribed 28    Plan Prescribed Dose Per Fraction 2.5 Gy   Plan Total Prescribed Dose 70.000000 Gy   Plan Primary Reference Point Prostate   Rad Jeralene Huff Session Summary     Status: None   Collection Time: 06/18/23  9:40 AM  Result Value Ref Range   Course ID [email or web address]_Prostate    Course Intent Unknown    Course Start Date 04/29/2023  2:20 PM    Session Number 28    Course First Treatment Date 05/11/2023  1:24 PM    Course Last  Treatment Date 06/18/2023  9:38 AM    Course Elapsed Days 38    Reference Point ID Prostate    Reference Point Dosage Given to Date 70 Gy   Reference Point Session Dosage Given 2.5 Gy   Plan ID Prostate    Plan Name 56fx    Plan Fractions Treated to Date 28    Plan Total Fractions Prescribed 28    Plan Prescribed Dose Per Fraction 2.5 Gy   Plan Total Prescribed Dose 70.000000 Gy   Plan Primary Reference Point Prostate  Assessment & Plan:  Kenalog cream sent to the pharmacy. Drink plenty of fluids.   Problem List Items Addressed This Visit       Musculoskeletal and Integument   Pruritus - Primary    Return if symptoms worsen or fail to improve.   Total time spent: 25 minutes  [employer], NP  07/27/2023   This document may have been prepared by Dragon Voice Recognition software and as such may include unintentional dictation errors.

## 2023-07-28 ENCOUNTER — Other Ambulatory Visit: Payer: Self-pay

## 2023-08-02 ENCOUNTER — Other Ambulatory Visit: Payer: Self-pay

## 2023-08-02 ENCOUNTER — Ambulatory Visit: Payer: Medicare PPO | Admitting: Cardiology

## 2023-08-02 NOTE — Progress Notes (Signed)
Disenrolling - patient overdue for office visit and cannot receive refills until then . Orencia last filled in July 2024. B.Mintz aware.

## 2023-08-05 NOTE — Progress Notes (Signed)
Office Visit Note  Patient: Michael Hinton.             Date of Birth: October 29, 1950           MRN: [id number]             PCP: Marisue Ivan, NP Referring: Orson Eva, NP Visit Date: 08/06/2023 Occupation: @GUAROCC @  Subjective:  Discuss restarting orencia    History of Present Illness: Michael Hinton. is a 72 y.o. male with history of seropositive rheumatoid arthritis.  Patient was previously prescribed Orencia 125 mg sq injections every 14 days but has been out of his prescription since October 2024.  He has been tapering prednisone gradually but thinks he has been taking prednisone 1 mg for the past 2 months.  He presents today experiencing a flare involving multiple joints.  His pain and inflammation have been most severe in both hands especially in his left third digit.  He is unable to make a fist and has had difficulty performing ADLs due to severity of pain and swelling. Patient has been under the care of radiation oncology for management of prostate cancer.  He has completed radiation therapy and will be having updated lab work including PSA prior to his next appointment.     Activities of Daily Living:  Patient reports morning stiffness for 1 hour.   Patient Denies nocturnal pain.  Difficulty dressing/grooming: Reports Difficulty climbing stairs: Denies Difficulty getting out of chair: Reports Difficulty using hands for taps, buttons, cutlery, and/or writing: Reports  Review of Systems  Constitutional:  Positive for fatigue.  HENT:  Positive for mouth dryness. Negative for mouth sores.   Eyes:  Negative for dryness.  Respiratory:  Negative for shortness of breath.   Cardiovascular:  Negative for chest pain and palpitations.  Gastrointestinal:  Negative for blood in stool, constipation and diarrhea.  Endocrine: Positive for increased urination.  Genitourinary:  Negative for involuntary urination.  Musculoskeletal:  Positive for joint pain, joint pain, joint  swelling, myalgias, muscle weakness, morning stiffness, muscle tenderness and myalgias. Negative for gait problem.  Skin:  Positive for sensitivity to sunlight. Negative for color change, rash and hair loss.  Allergic/Immunologic: Negative for susceptible to infections.  Neurological:  Negative for dizziness and headaches.  Hematological:  Negative for swollen glands.  Psychiatric/Behavioral:  Negative for depressed mood and sleep disturbance. The patient is not nervous/anxious.     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   Rheumatoid arthritis involving multiple sites with positive rheumatoid factor (HCC) 06/02/2016   High risk medication use 06/02/2016   Hypertension 06/02/2016   Vitamin D deficiency 06/02/2016    Past Medical History:  Diagnosis Date   Anemia    Aortic atherosclerosis (HCC)    Atrial fibrillation (HCC)    a.) CHA2DS2VASc = 5 (age, HTN, CVA x 2, vascular disease history);  b.) rate/rhythm maintained without  pharmacological intervention; chronically anticoagulated with clopidogrel   Bilateral renal cysts 05/29/2022   Carotid artery disease (HCC) 04/11/2022   a.) doppler 04/11/2022: <50 LICA, no sig RICA.   CVA (cerebral vascular accident) [employer])    a.) MRI brain 01/14/2022: numerous chronic cerebellar infarcts; b.) CT head 04/10/2022: small old lacunar infarcts are seen in cerebellum bilaterally   Diastolic dysfunction  04/11/2022   a.) TTE 04/11/2022: EF 55-60%, mild BAE, mild MR, G1DD.   HLD (hyperlipidemia)    Hypertension 06/02/2016   Long term current use of antithrombotics/antiplatelets    a.) clopidogrel   Long term current use of systemic steroids    a.) prednisone for diagnosis of RA   Neutropenia (HCC)    Prostate cancer (HCC)    Renal cell cancer, left (HCC) 04/10/2022   a.) renal US 04/10/2022: solid heterogenous LEFT renal mass measuring 5.2 cm; b.) MRI ABD - 5.3 x 4.3 cm renal mass to posterior lip of LEFT kidney extending into renal sinus; c.)  renal Bx  05/06/2022 - (+) for RCC with clear cell features   Rheumatoid arthritis involving multiple sites with positive rheumatoid factor (HCC) 06/02/2016   T2DM (type 2 diabetes mellitus) (HCC)    Thoracic ascending aortic aneurysm (HCC) 06/01/2022   a.) CT chest - measured 4.2 cm    Family History  Problem Relation Age of Onset   Heart disease Mother    Diabetes Mother    Breast cancer Mother    Kidney disease Father    Diabetes Father    Diabetes Daughter    Past Surgical History:  Procedure Laterality Date   LAPAROSCOPIC NEPHRECTOMY, HAND ASSISTED Left 07/13/2022   Procedure: HAND ASSISTED LAPAROSCOPIC RADICAL NEPHRECTOMY;  Surgeon: Vanna Scotland, MD;  Location: [facility] ORS;  Service: Urology;  Laterality: Left;   RENAL BIOPSY  05/2022   Social History   Social History Narrative   Not on file   Immunization History  Administered Date(s) Administered   Moderna Sars-Covid-2 Vaccination 10/12/2019, 11/15/2019   PPD Test 03/20/2022     Objective: Vital Signs: BP (!) 96/56 (BP Location: Left Arm, Patient Position: Sitting, Cuff Size: Normal)   Pulse 67   Resp 16   Ht 5\' 11"  (1.803 m)   Wt 183 lb (83 kg)   BMI 25.52 kg/m    Physical Exam Vitals and nursing note reviewed.  Constitutional:      Appearance: He is well-developed.  HENT:     Head: Normocephalic and atraumatic.  Eyes:     Conjunctiva/sclera: Conjunctivae  normal.     Pupils: Pupils are equal, round, and reactive to light.  Cardiovascular:     Rate and Rhythm: Normal rate and regular rhythm.     Heart sounds: Normal heart sounds.  Pulmonary:     Effort: Pulmonary effort is normal.     Breath sounds: Normal breath sounds.  Abdominal:     General: Bowel sounds are normal.     Palpations: Abdomen is soft.  Musculoskeletal:     Cervical back: Normal range of motion and neck supple.  Skin:    General: Skin is warm and dry.     Capillary Refill: Capillary refill takes less than 2 seconds.  Neurological:     Mental Status: He is alert and oriented to person, place, and time.  Psychiatric:        Behavior: Behavior normal.      Musculoskeletal Exam: C-spine has limited range of motion.  Thoracic kyphosis noted.  Limited mobility  of the lumbar spine.  Shoulder joints have limited abduction and internal rotation bilaterally.  Bilateral flexion contractures noted in both elbows.  Limited range of motion of both wrist joints.  Synovial thickening of both wrist and all MCP joints.  PIP and DIP thickening.  Severe tenderness and synovitis involving bilateral third PIP joints, most severe in his left third PIP joint noted.  Inflammation was noted in the right first MCP and right second PIP joint.  Incomplete fist formation noted bilaterally.  Flexion contractures noted in both knees, right more severe than left.  Difficulty ambulating due to inability to extend his knees.  Synovial thickening and tenderness of both ankles noted.  CDAI Exam: CDAI Score: 30  Patient Global: 80 / 100; Provider Global: 80 / 100 Swollen: 5 ; Tender: 11  Joint Exam 08/06/2023      Right  Left  Glenohumeral   Tender   Tender  MCP 1  Swollen Tender     MCP 3     Swollen Tender  PIP 2 (finger)  Swollen Tender     PIP 3 (finger)  Swollen Tender  Swollen Tender  Knee   Tender   Tender  Ankle   Tender   Tender     Investigation: No additional  findings.  Imaging: Chest 2 View Result Date: 07/23/2023 CLINICAL DATA:  Kidney cancer, post nephrectomy, monitor. EXAM: CHEST - 2 VIEW COMPARISON:  Chest CT 10/22/2022, radiograph 05/28/2022 FINDINGS: Normal heart size, unchanged mediastinal contours. Mild aortic tortuosity. Streaky opacity at the left lung base is improved from prior exam with residual subsegmental scarring. Bandlike subsegmental opacity in the right lung base also likely represent scarring. Chronic blunting of right costophrenic angle, no significant effusion. No evidence of pulmonary mass or visible nodule. No pneumothorax. Thoracic degenerative change. On limited assessment, no acute osseous findings. IMPRESSION: 1. No radiographic evidence of metastatic disease in the chest. 2. Bibasilar scarring. Electronically Signed   By: Narda Rutherford M.D.   On: 07/23/2023 18:13   CT Abdomen Pelvis W Contrast Result Date: 07/18/2023 CLINICAL DATA:  Left-sided renal cell carcinoma status post nephrectomy. Monitor. * Tracking Code: BO *. EXAM: CT ABDOMEN AND PELVIS WITH CONTRAST TECHNIQUE: Multidetector CT imaging of the abdomen and pelvis was performed using the standard protocol following bolus administration of intravenous contrast. RADIATION DOSE REDUCTION: This exam was performed according to the departmental dose-optimization program which includes automated exposure control, adjustment of the mA and/or kV according to patient size and/or use of iterative reconstruction technique. CONTRAST:  80mL OMNIPAQUE IOHEXOL 300 MG/ML  SOLN COMPARISON:  Multiple priors including MRI abdomen April 10, 2022 FINDINGS: Lower chest: Bibasilar atelectasis/scarring. Hepatobiliary: No suspicious hepatic lesion. Gallbladder is distended without focal wall thickening or pericholecystic fluid. No biliary ductal dilation. Pancreas: No pancreatic ductal dilation or evidence of acute inflammation. Spleen: No splenomegaly or focal splenic lesion. Adrenals/Urinary  Tract: 9 mm left adrenal nodule on image 20/2 is stable from MRI April 10, 2022. Similar lobularity of the right adrenal gland. Left kidney surgically absent without suspicious enhancing nodularity in the nephrectomy bed. Right renal lesions previously characterized as cysts on MRI April 10, 2022 are stable. No suspicious right renal mass or hydronephrosis. Urinary bladder is unremarkable for degree of distension. Stomach/Bowel: No radiopaque enteric contrast material was administered. Stomach is minimally distended limiting evaluation. No pathologic dilation of small or large bowel. No evidence of acute bowel inflammation. Vascular/Lymphatic: Aortic atherosclerosis. Normal caliber abdominal aorta. Smooth IVC contours. No pathologically enlarged abdominal  or pelvic lymph nodes. Reproductive: Brachytherapy seeds in the prostate gland. Other: Postsurgical change in the abdominal wall. No significant abdominopelvic free fluid. Musculoskeletal: Sclerotic focus in the right L4 vertebral body. No aggressive lytic osseous lesion. Multilevel degenerative change of the spine. Degenerative change of the bilateral hips. IMPRESSION: 1. Status post left nephrectomy without evidence of local recurrence or convincing evidence of metastatic disease in the abdomen or pelvis. 2. Stable 9 mm left adrenal nodule, nonspecific but favored a benign adenoma suggest continued attention on follow-up imaging. 3. Sclerotic focus in the right L4 vertebral body, possibly a bone island however in the setting of prostate neoplasm metastatic disease while not favored is not excluded. Consider further evaluation with nuclear medicine bone scan if clinically indicated. Otherwise suggest attention on follow-up imaging. 4.  Aortic Atherosclerosis (ICD10-I70.0). Electronically Signed   By: Maudry Mayhew M.D.   On: 07/18/2023 10:04    Recent Labs: Lab Results  Component Value Date   WBC 3.5 (L) 05/31/2023   HGB 10.2 (L) 05/31/2023   PLT  169 05/31/2023   NA 139 01/25/2023   K 4.0 01/25/2023   CL 109 01/25/2023   CO2 19 (L) 01/25/2023   GLUCOSE 77 01/25/2023   BUN 31 (H) 01/25/2023   CREATININE 1.85 (H) 01/25/2023   BILITOT 0.5 01/25/2023   ALKPHOS 94 04/10/2022   AST 15 01/25/2023   ALT 11 01/25/2023   PROT 7.1 01/25/2023   ALBUMIN 3.3 (L) 04/10/2022   CALCIUM 9.8 01/25/2023   GFRAA 86 02/03/2021   QFTBGOLD NEGATIVE 06/16/2017   QFTBGOLDPLUS INDETERMINATE (A) 03/06/2022    Speciality Comments: TB Skin Test 03/23/2022: Negative  Procedures:  No procedures performed Allergies: Patient has no known allergies.      Assessment / Plan:     Visit Diagnoses: Rheumatoid arthritis involving multiple sites with positive rheumatoid factor (HCC): Patient was last seen in the office on 01/25/2023 at which time he was on Orencia 125 mg subcutaneous injections every 14 days.  Since then the patient has been diagnosed with prostate cancer and has been under the care of Dr. Sherrine Maples Chrystal--reviewed office visit note from 07/21/2023--he has completed IMRT radiation therapy and will be having a repeat PSA in 3 months to monitor response of treatment. Patient presents today experiencing a severe flare of rheumatoid arthritis involving multiple joints.  His pain and inflammation have been most severe involving both hands.  He has significant inflammation involving the left third PIP joint and has incomplete fist formation bilaterally.  He has tapered his dose of prednisone down to 1 mg daily and has been off of Orencia since at least October 2024.  The prescription for Dub Amis has been on hold due to requiring an office visit, updated lab work, and clearance by radiation oncology. Plan to update CBC, CMP, TB Gold today. A prescription for prednisone starting at 10 mg tapering by 2.5 mg every 2 weeks was sent to the pharmacy until he reaches 5 mg daily which he will continue long-term for now.  Plan to reach out to the patient's radiation  oncologist for clearance to resume Orencia.  A new prescription for Orencia will be sent to the pharmacy pending clearance from radiation oncology as well as pending lab results.  He will follow-up in the office in 2 months or sooner if needed.  High risk medication use - Orencia started on 10/28/22--He was spacing orencia by every 14 days due to history of neutropenia.   CBC and CMP updated today.  TB skin test 03/23/22. Due to update PPD in August 2024.  He is no longer driving and has issues with transportation so he has requested to have TB gold rechecked today.  If TB gold is indeterminate plan to have PPD rechecked at PCPs office. . - Plan: CBC with Differential/Platelet, COMPLETE METABOLIC PANEL WITH GFR, QuantiFERON-TB Gold Plus  Screening for tuberculosis - Order for TB gold released today.  If indeterminate--plan to proceed with PPD at PCPs office.  Patient has been unable to drive and has issues with transportation so he requested to have TB gold rechecked today while in the clinic.   plan: QuantiFERON-TB Gold Plus  Prostate cancer [facility]): Reviewed Dr. Kipp Laurence office visit note from 07/21/23: Prostate Cancer (Stage II, Gleason 7 (4+3), T1C, N0, M0).  Completed IMRT radiation therapy one month ago. Plan for follow-up in three months with a PSA blood test prior to the visit to monitor response to treatment. Plan to reach out for clearance to resume Orencia.   Other drug-induced neutropenia (HCC): White blood cell count was 3.5 on 05/31/2023.  CBC with differential updated today.  Long term systemic steroid user: Patient has been taking prednisone 1 mg daily.  Plan to temporarily increase prednisone to 10 mg to alleviate his current flare.  He will take prednisone 10 mg daily for 2 weeks then 7.5 mg for 2 weeks, then resume prednisone 5 mg daily.  A new prescription for prednisone was sent to the pharmacy today.  Renal cell adenoma, left: Incidental finding while hospitalized 9/1 to  04/14/2022.  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 Patient was admitted on 07/13/2022 for scheduled hand-assisted laparoscopic left radical nephrectomy with Dr. Apolinar Junes for management.   Left renal cell carcinoma status post uncomplicated radical nephrectomy on 07/13/2022.  Plan to update CT abdomen and pelvis along with chest x-ray in 1 year for surveillance.  No further therapy was indicated.  ANA positive: No clinical features of systemic lupus at this time.  Contracture of left elbow: Chronic, unchanged.  Contracture of right elbow: Chronic, unchanged.  Contractures of both knees: Flexion contracture noted in both knees, right more severe than left.  Difficulty ambulating due to the severity of pain as well as limited extension.  Vitamin D deficiency: He is taking vitamin D 5000 units daily.  Primary hypertension: Blood pressure was 96/56 today in the office.  Spinal stenosis of lumbar region without neurogenic claudication: Chronic pain     Orders: Orders Placed This Encounter  Procedures   CBC with Differential/Platelet   COMPLETE METABOLIC PANEL WITH GFR   QuantiFERON-TB Gold Plus   Meds ordered this encounter  Medications   predniSONE (DELTASONE) 5 MG tablet    Sig: Take 2 tablets (10 mg total) by mouth daily x2 weeks, 1.5 tablets (7.5 mg total) daily x2 weeks, and then continue 1 tablet (5 mg) daily.    Dispense:  63 tablet    Refill:  0     Follow-Up Instructions: Return in about 2 months (around 10/07/2023) for Rheumatoid arthritis.   Gearldine Bienenstock, PA-C  Note - This record has been created using Dragon software.  Chart creation errors have been sought, but may not always  have been located. Such creation errors do not reflect on  the standard of medical care.

## 2023-08-06 ENCOUNTER — Encounter: Payer: Self-pay | Admitting: Physician Assistant

## 2023-08-06 ENCOUNTER — Telehealth: Payer: Self-pay | Admitting: Physician Assistant

## 2023-08-06 ENCOUNTER — Ambulatory Visit: Payer: Medicare PPO | Attending: Physician Assistant | Admitting: Physician Assistant

## 2023-08-06 ENCOUNTER — Telehealth: Payer: Self-pay

## 2023-08-06 VITALS — BP 96/56 | HR 67 | Resp 16 | Ht 71.0 in | Wt 183.0 lb

## 2023-08-06 DIAGNOSIS — Z79899 Other long term (current) drug therapy: Secondary | ICD-10-CM | POA: Diagnosis not present

## 2023-08-06 DIAGNOSIS — M24561 Contracture, right knee: Secondary | ICD-10-CM | POA: Diagnosis not present

## 2023-08-06 DIAGNOSIS — M24521 Contracture, right elbow: Secondary | ICD-10-CM | POA: Diagnosis not present

## 2023-08-06 DIAGNOSIS — M48061 Spinal stenosis, lumbar region without neurogenic claudication: Secondary | ICD-10-CM

## 2023-08-06 DIAGNOSIS — Z7952 Long term (current) use of systemic steroids: Secondary | ICD-10-CM

## 2023-08-06 DIAGNOSIS — E559 Vitamin D deficiency, unspecified: Secondary | ICD-10-CM

## 2023-08-06 DIAGNOSIS — M51369 Other intervertebral disc degeneration, lumbar region without mention of lumbar back pain or lower extremity pain: Secondary | ICD-10-CM

## 2023-08-06 DIAGNOSIS — Z111 Encounter for screening for respiratory tuberculosis: Secondary | ICD-10-CM

## 2023-08-06 DIAGNOSIS — M0579 Rheumatoid arthritis with rheumatoid factor of multiple sites without organ or systems involvement: Secondary | ICD-10-CM

## 2023-08-06 DIAGNOSIS — R768 Other specified abnormal immunological findings in serum: Secondary | ICD-10-CM | POA: Diagnosis not present

## 2023-08-06 DIAGNOSIS — I10 Essential (primary) hypertension: Secondary | ICD-10-CM

## 2023-08-06 DIAGNOSIS — M24522 Contracture, left elbow: Secondary | ICD-10-CM

## 2023-08-06 DIAGNOSIS — D3002 Benign neoplasm of left kidney: Secondary | ICD-10-CM | POA: Diagnosis not present

## 2023-08-06 DIAGNOSIS — D702 Other drug-induced agranulocytosis: Secondary | ICD-10-CM | POA: Diagnosis not present

## 2023-08-06 DIAGNOSIS — C61 Malignant neoplasm of prostate: Secondary | ICD-10-CM

## 2023-08-06 DIAGNOSIS — M24562 Contracture, left knee: Secondary | ICD-10-CM

## 2023-08-06 MED ORDER — PREDNISONE 5 MG PO TABS: ORAL_TABLET | ORAL | 0 refills | Status: DC

## 2023-08-06 NOTE — Telephone Encounter (Signed)
-----   Message from Gearldine Bienenstock sent at 08/06/2023  2:53 PM EST ----- Regarding: FW: Clearance to resume Orencia Addie--Please notify the patient--ok to resume orencia from a radiation oncology standpoint.   Devki--ok to send in new prescription for orencia pending lab results. ----- Message ----- From: Carmina Miller, MD Sent: 08/06/2023   2:02 PM EST To: Gearldine Bienenstock, PA-C Subject: RE: Clearance to resume Orencia                Should be no problem to resume  your treatment  gc ----- Message ----- From: Gearldine Bienenstock, PA-C Sent: 08/06/2023  11:53 AM EST To: Carmina Miller, MD Subject: Clearance to resume Orencia                    Hi Dr. Rushie Chestnut,   I am reaching out about a mutual patient, Michael Hinton.  He is under our care at [facility] of management of rheumatoid arthritis--he has been off of Orencia injections since October 2024 and presented today experiencing a severe flare.  We are hoping to restart his orencia, but I wanted to reach out for clearance to resume therapy from a radiation oncology standpoint?   Thank you in advance for any clarification,   Sherron Ales, PA-C  [facility]

## 2023-08-06 NOTE — Telephone Encounter (Signed)
Contacted the patient and advised ok to resume orencia from a radiation oncology standpoint. Patient verbalized understanding.

## 2023-08-06 NOTE — Telephone Encounter (Signed)
I reached out to [facility] Chrystal for clearance from a radiation oncology standpoint for the patient to resume sq orencia--Dr. Rushie Chestnut is ok with [place] restarting the patient on orenica.  Plan to send in new prescription pending lab results.

## 2023-08-06 NOTE — Telephone Encounter (Signed)
Awaiting labs to be updated before sending SQ Orencia rx to [facility]. Patient will be able to start at home due to transportation issues  Chesley Mires, PharmD, MPH, BCPS, CPP Clinical Pharmacist (Rheumatology and Pulmonology)

## 2023-08-08 LAB — CBC WITH DIFFERENTIAL/PLATELET
Absolute Lymphocytes: 448 {cells}/uL — ABNORMAL LOW (ref 850–3900)
Absolute Monocytes: 391 {cells}/uL (ref 200–950)
Basophils Absolute: 11 {cells}/uL (ref 0–200)
Basophils Relative: 0.3 %
Eosinophils Absolute: 110 {cells}/uL (ref 15–500)
Eosinophils Relative: 2.9 %
HCT: 25 % — ABNORMAL LOW (ref 38.5–50.0)
Hemoglobin: 8.2 g/dL — ABNORMAL LOW (ref 13.2–17.1)
MCH: 27.2 pg (ref 27.0–33.0)
MCHC: 32.8 g/dL (ref 32.0–36.0)
MCV: 83.1 fL (ref 80.0–100.0)
MPV: 9.8 fL (ref 7.5–12.5)
Monocytes Relative: 10.3 %
Neutro Abs: 2839 {cells}/uL (ref 1500–7800)
Neutrophils Relative %: 74.7 %
Platelets: 222 10*3/uL (ref 140–400)
RBC: 3.01 10*6/uL — ABNORMAL LOW (ref 4.20–5.80)
RDW: 13.8 % (ref 11.0–15.0)
Total Lymphocyte: 11.8 %
WBC: 3.8 10*3/uL (ref 3.8–10.8)

## 2023-08-08 LAB — COMPLETE METABOLIC PANEL WITH GFR
AG Ratio: 1 (calc) (ref 1.0–2.5)
ALT: 46 U/L (ref 9–46)
AST: 86 U/L — ABNORMAL HIGH (ref 10–35)
Albumin: 3.7 g/dL (ref 3.6–5.1)
Alkaline phosphatase (APISO): 92 U/L (ref 35–144)
BUN/Creatinine Ratio: 13 (calc) (ref 6–22)
BUN: 34 mg/dL — ABNORMAL HIGH (ref 7–25)
CO2: 18 mmol/L — ABNORMAL LOW (ref 20–32)
Calcium: 9.4 mg/dL (ref 8.6–10.3)
Chloride: 108 mmol/L (ref 98–110)
Creat: 2.65 mg/dL — ABNORMAL HIGH (ref 0.70–1.28)
Globulin: 3.6 g/dL (ref 1.9–3.7)
Glucose, Bld: 83 mg/dL (ref 65–99)
Potassium: 3.8 mmol/L (ref 3.5–5.3)
Sodium: 139 mmol/L (ref 135–146)
Total Bilirubin: 0.7 mg/dL (ref 0.2–1.2)
Total Protein: 7.3 g/dL (ref 6.1–8.1)
eGFR: 25 mL/min/{1.73_m2} — ABNORMAL LOW (ref >=60)

## 2023-08-08 LAB — QUANTIFERON-TB GOLD PLUS
Mitogen-NIL: 0.08 [IU]/mL
NIL: 0.02 [IU]/mL
QuantiFERON-TB Gold Plus: UNDETERMINED — AB
TB1-NIL: 0.01 [IU]/mL
TB2-NIL: 0.02 [IU]/mL

## 2023-08-09 NOTE — Progress Notes (Signed)
TB gold indeterminate--recommend proceeding with PPD at PCPs office.  Hold off on restarting orencia until PPD results are completed.  RBC count, hemoglobin and hematocrit are very low.  Could be secondary to CKD.  Please forward to nephrologist and PCP.  Absolute lymphocytes are low. Rest of CBC WNL.  Creatinine is very elevated-2.65 and GFR is low-25.  Please notify the patient and forward results to the patiens nephrologist and urologist.

## 2023-08-12 ENCOUNTER — Other Ambulatory Visit (HOSPITAL_COMMUNITY): Payer: Self-pay

## 2023-08-12 MED ORDER — ORENCIA CLICKJECT 125 MG/ML ~~LOC~~ SOAJ: 125.0000 mg | SUBCUTANEOUS | 0 refills | Status: DC

## 2023-08-12 NOTE — Telephone Encounter (Signed)
 Patient has been cleared by radiation oncologist to restart RA treatment.  TB gold was indeterminate. Will need PPD placed by PCP.  Chesley Mires, PharmD, MPH, BCPS, CPP Clinical Pharmacist (Rheumatology and Pulmonology)

## 2023-08-12 NOTE — Addendum Note (Signed)
 Addended by: Murrell Redden on: 08/12/2023 09:43 AM   Modules accepted: Orders

## 2023-08-17 ENCOUNTER — Encounter: Payer: Self-pay | Admitting: Urology

## 2023-08-17 ENCOUNTER — Ambulatory Visit (INDEPENDENT_AMBULATORY_CARE_PROVIDER_SITE_OTHER): Payer: Self-pay | Admitting: Urology

## 2023-08-17 VITALS — BP 99/64 | HR 87 | Ht 71.0 in | Wt 178.0 lb

## 2023-08-17 DIAGNOSIS — N189 Chronic kidney disease, unspecified: Secondary | ICD-10-CM

## 2023-08-17 DIAGNOSIS — Z85528 Personal history of other malignant neoplasm of kidney: Secondary | ICD-10-CM

## 2023-08-17 DIAGNOSIS — C61 Malignant neoplasm of prostate: Secondary | ICD-10-CM

## 2023-08-17 DIAGNOSIS — C642 Malignant neoplasm of left kidney, except renal pelvis: Secondary | ICD-10-CM

## 2023-08-17 DIAGNOSIS — R634 Abnormal weight loss: Secondary | ICD-10-CM

## 2023-08-17 NOTE — Progress Notes (Signed)
 I,Amy L Pierron,acting as a scribe for Rosina Riis, MD.,have documented all relevant documentation on the behalf of Rosina Riis, MD,as directed by  Rosina Riis, MD while in the presence of Rosina Riis, MD.  08/17/2023 1:17 PM   Michael Hinton [id number]  Referring provider: Carin Gauze, NP [address] [place],  [place] [address]  Chief Complaint  Patient presents with   Prostate Cancer    HPI: 73 year-old male with a personal history multiple GU malignancies including prostate cancer and renal cell carcinoma presents today for a follow-up.  His PSA had risen to 8.1 on 08/26/2022. He had a prostate MRI completed in 09/2022 that showed a 21 gram prostate with heterogenous intensity representing inflammation. It was PI-RADS 2.    His PSA continues to rise to 9.1 in 12/2022 which led to a prostate biopsy that showed Gleason 3+4 as well as 4+3 lesion at the left lateral base and left lateral mid up to 37% of the tissue.  Ultimately he elected to undergo IMRT and six months of ADT. Fiducial markers were placed in September 2024 and he completed IMRT in November 2024. His PSA has not been drawn since having recently completed the hormone therapy.  For his renal cell malignancy he underwent left laparoscopic hand-assisted nephrectomy on 07/21/2022. His post-operative course was unremarkable. 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 Largest measurement was 5.5 centimeters. Pathologic stage was pT1B. The baseline creatinine was 1.2. Post-operative creatinine has been more elevated, most recently as high as 2.67, which is higher than his post-op around 1.8.   His is accompanied by his wife and says he sees the kidney doctor on Thursday this week.  He mentioned his urinary frequency has improved since starting Flomax .   He reports feeling more dehydrated with increased water  intake. He has recently lost  some weight in a short period of time.   PMH: Past Medical History:  Diagnosis Date   Anemia    Aortic atherosclerosis (HCC)    Atrial fibrillation (HCC)    a.) CHA2DS2VASc = 5 (age, HTN, CVA x 2, vascular disease history);  b.) rate/rhythm maintained without pharmacological intervention; chronically anticoagulated with clopidogrel    Bilateral renal cysts 05/29/2022   Carotid artery disease (HCC) 04/11/2022   a.) doppler 04/11/2022: <50 LICA, no sig RICA.   CVA (cerebral vascular accident) [facility])    a.) MRI brain 01/14/2022: numerous chronic cerebellar infarcts; b.) CT head 04/10/2022: small old lacunar infarcts are seen in cerebellum bilaterally   Diastolic dysfunction 04/11/2022   a.) TTE 04/11/2022: EF 55-60%, mild BAE, mild MR, G1DD.   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 If you have any questions, ask your nurse or doctor.          amLODipine  10 MG tablet Commonly known as: NORVASC  Take 1 tablet (10 mg total) by mouth every morning.   folic acid  1 MG tablet Commonly known as: FOLVITE  Take 1 tablet (1 mg total) by mouth daily.   IRON  PO Take 1 tablet by mouth daily.   losartan  50 MG tablet Commonly known as: COZAAR  Take 1 tablet (50 mg total) by mouth daily.   predniSONE  5 MG tablet Commonly known as: DELTASONE  Take 2 tablets (10 mg total) by mouth daily x2 weeks, 1.5 tablets (7.5 mg total) daily x2 weeks, and then continue 1 tablet (5 mg) daily.   rosuvastatin  40 MG tablet Commonly known as: CRESTOR  Take 1 tablet (40 mg total) by mouth daily.   tamsulosin  0.4 MG Caps capsule Commonly known as: FLOMAX  Take 1 capsule (0.4 mg total) by mouth daily after supper.   triamcinolone  ointment 0.5 % Commonly known as: KENALOG  Apply 1 Application topically 2 (two) times daily.   Vitamin D -3 125 MCG (5000 UT) Tabs Take 1 tablet by mouth daily.        Family History: Family History  Problem Relation Age of Onset   Heart disease Mother    Diabetes Mother    Breast cancer Mother    Kidney disease Father    Diabetes Father    Diabetes Daughter     Social History:  reports that he quit smoking about 45 years ago. His smoking use included cigarettes. He started smoking about 52 years ago. He has a 3.5 pack-year smoking history. He has been exposed to tobacco smoke. He has never used smokeless tobacco. He reports that he does not currently use alcohol. He reports that he does not use drugs.   Physical Exam: BP 99/64   Pulse 87   Ht 5' 11 (1.803 m)   Wt 178 lb (80.7 kg)   BMI 24.83 kg/m   Constitutional:  Alert and oriented, No acute distress.  Accompanied by care taker today.   HEENT: [place] AT, moist mucus membranes.  Trachea midline, no masses. Neurologic: Grossly intact, no focal  deficits, moving all 4 extremities. Psychiatric: Normal mood and affect.   Pertinent Imaging: Narrative & Impression  CLINICAL DATA:  Left-sided renal cell carcinoma status post nephrectomy. Monitor. * Tracking Code: BO *.   EXAM: CT ABDOMEN AND PELVIS WITH CONTRAST   TECHNIQUE: Multidetector CT imaging of the abdomen and pelvis was performed using the standard protocol following bolus administration of intravenous contrast.   RADIATION DOSE REDUCTION: This exam was performed according to the departmental dose-optimization program which includes automated exposure control, adjustment of the mA and/or kV according to patient size and/or use of iterative reconstruction technique.   CONTRAST:  80mL OMNIPAQUE  IOHEXOL  300 MG/ML  SOLN   COMPARISON:  Multiple priors including MRI abdomen April 10, 2022   FINDINGS: Lower chest: Bibasilar atelectasis/scarring.   Hepatobiliary: No suspicious hepatic lesion. Gallbladder  is distended without focal wall thickening or pericholecystic fluid. No biliary ductal dilation.   Pancreas: No pancreatic ductal dilation or evidence of acute inflammation.   Spleen: No splenomegaly or focal splenic lesion.   Adrenals/Urinary Tract: 9 mm left adrenal nodule on image 20/2 is stable from MRI April 10, 2022. Similar lobularity of the right adrenal gland.   Left kidney surgically absent without suspicious enhancing nodularity in the nephrectomy bed.   Right renal lesions previously characterized as cysts on MRI April 10, 2022 are stable. No suspicious right renal mass or hydronephrosis.   Urinary bladder is unremarkable for degree of distension.   Stomach/Bowel: No radiopaque enteric contrast material was administered. Stomach is minimally distended limiting evaluation. No pathologic dilation of small or large bowel. No evidence of acute bowel inflammation.   Vascular/Lymphatic: Aortic atherosclerosis. Normal caliber  abdominal aorta. Smooth IVC contours. No pathologically enlarged abdominal or pelvic lymph nodes.   Reproductive: Brachytherapy seeds in the prostate gland.   Other: Postsurgical change in the abdominal wall. No significant abdominopelvic free fluid.   Musculoskeletal: Sclerotic focus in the right L4 vertebral body. No aggressive lytic osseous lesion. Multilevel degenerative change of the spine. Degenerative change of the bilateral hips.   IMPRESSION: 1. Status post left nephrectomy without evidence of local recurrence or convincing evidence of metastatic disease in the abdomen or pelvis. 2. Stable 9 mm left adrenal nodule, nonspecific but favored a benign adenoma suggest continued attention on follow-up imaging. 3. Sclerotic focus in the right L4 vertebral body, possibly a bone island however in the setting of prostate neoplasm metastatic disease while not favored is not excluded. Consider further evaluation with nuclear medicine bone scan if clinically indicated. Otherwise suggest attention on follow-up imaging. 4.  Aortic Atherosclerosis (ICD10-I70.0).   Electronically Signed   By: Reyes Holder M.D.   On: 07/18/2023 10:04   Narrative & Impression  CLINICAL DATA:  Kidney cancer, post nephrectomy, monitor.   EXAM: CHEST - 2 VIEW   COMPARISON:  Chest CT 10/22/2022, radiograph 05/28/2022   FINDINGS: Normal heart size, unchanged mediastinal contours. Mild aortic tortuosity. Streaky opacity at the left lung base is improved from prior exam with residual subsegmental scarring. Bandlike subsegmental opacity in the right lung base also likely represent scarring. Chronic blunting of right costophrenic angle, no significant effusion. No evidence of pulmonary mass or visible nodule. No pneumothorax. Thoracic degenerative change. On limited assessment, no acute osseous findings.   IMPRESSION: 1. No radiographic evidence of metastatic disease in the chest. 2. Bibasilar  scarring.   Electronically Signed   By: Andrea Gasman M.D.   On: 07/23/2023 18:13  Personally reviewed the above images and agree with radiologic interpretation.   Assessment & Plan:    1. Chronic kidney disease  - Being followed by nephrologist Dr. Marcelino and encouraged to continue regular follow-ups.  2. Prostate cancer  - No further ADT at this time. He's completed XRT.   - Will have him get a PSA in about three months from now. Dr. Chrystal has order placed for this to be done in March. Will follow up with the results and determine whether or not any further investigation of the sclerotic bone lesion is needed; highly suspect this is benign  - Managed on Flomax  and suggested he stop taking it to evaluate any symptom changes. If symptoms stable okay to discontinue.  3. History of renal cell carcinoma  - Personally reviewed the CT abdomen, pelvis, and chest x-ray indicate no obvious recurrence of  his disease status post nephrectomy. Will continue to follow this annually for at least three years as per guidelines.  - Notably, he did have a sclerotic focus at the L4 vertebral body on this study. Thought to be likely a bone island. As long as this PSA remains undetectable, will just continue to follow this on serial imaging.  4. Weight loss  - Caregiver's concerned about his eating habits and weight loss. From a GU perspective do not feel that this is related. Encouraged him to follow up with nephrology and primary care along with rheumatology for further assessment.  Return in about 1 year (around 08/16/2024) for PSA, CT scan, chest xray.  I have reviewed the above documentation for accuracy and completeness, and I agree with the above.   Rosina Riis, MD  [facility] [address] [place], [place] [address] [phone]  I spent 33 total minutes on the day of the encounter including pre-visit review of the medical record, face-to-face time  with the patient, and post visit ordering of labs/imaging/tests.

## 2023-08-18 ENCOUNTER — Encounter: Payer: Self-pay | Admitting: Cardiology

## 2023-08-19 ENCOUNTER — Other Ambulatory Visit: Payer: Self-pay | Admitting: Cardiology

## 2023-08-19 DIAGNOSIS — I10 Essential (primary) hypertension: Secondary | ICD-10-CM | POA: Diagnosis not present

## 2023-08-19 DIAGNOSIS — N1832 Chronic kidney disease, stage 3b: Secondary | ICD-10-CM | POA: Diagnosis not present

## 2023-08-19 DIAGNOSIS — D631 Anemia in chronic kidney disease: Secondary | ICD-10-CM | POA: Diagnosis not present

## 2023-08-19 MED ORDER — MIRTAZAPINE 7.5 MG PO TABS: 7.5000 mg | ORAL_TABLET | Freq: Every day | ORAL | 3 refills | Status: DC

## 2023-08-23 ENCOUNTER — Ambulatory Visit (LOCAL_COMMUNITY_HEALTH_CENTER): Payer: Self-pay

## 2023-08-23 DIAGNOSIS — Z111 Encounter for screening for respiratory tuberculosis: Secondary | ICD-10-CM

## 2023-08-23 NOTE — Progress Notes (Signed)
 Patient here for ppd as recommended by provider before he starts orcenia.  08/06/2023 QFT indeterminate Last ppd 03/22/2022 PPDR 0 mm negative.  PPD  placed.Jerel Shepherd, RN

## 2023-08-24 ENCOUNTER — Inpatient Hospital Stay
Admission: EM | Admit: 2023-08-24 | Discharge: 2023-08-29 | DRG: 683 | Disposition: A | Discharge status: Home / Self Care | Payer: Medicare PPO | Attending: Internal Medicine | Admitting: Internal Medicine

## 2023-08-24 ENCOUNTER — Other Ambulatory Visit: Payer: Self-pay

## 2023-08-24 ENCOUNTER — Emergency Department: Payer: Medicare PPO

## 2023-08-24 DIAGNOSIS — Z905 Acquired absence of kidney: Secondary | ICD-10-CM

## 2023-08-24 DIAGNOSIS — H9190 Unspecified hearing loss, unspecified ear: Secondary | ICD-10-CM | POA: Diagnosis present

## 2023-08-24 DIAGNOSIS — M0579 Rheumatoid arthritis with rheumatoid factor of multiple sites without organ or systems involvement: Secondary | ICD-10-CM | POA: Diagnosis present

## 2023-08-24 DIAGNOSIS — E8721 Acute metabolic acidosis: Secondary | ICD-10-CM | POA: Diagnosis present

## 2023-08-24 DIAGNOSIS — I4891 Unspecified atrial fibrillation: Secondary | ICD-10-CM | POA: Diagnosis not present

## 2023-08-24 DIAGNOSIS — Z7952 Long term (current) use of systemic steroids: Secondary | ICD-10-CM | POA: Diagnosis not present

## 2023-08-24 DIAGNOSIS — R4182 Altered mental status, unspecified: Secondary | ICD-10-CM | POA: Diagnosis not present

## 2023-08-24 DIAGNOSIS — Z87891 Personal history of nicotine dependence: Secondary | ICD-10-CM | POA: Diagnosis not present

## 2023-08-24 DIAGNOSIS — Z85528 Personal history of other malignant neoplasm of kidney: Secondary | ICD-10-CM | POA: Diagnosis not present

## 2023-08-24 DIAGNOSIS — I130 Hypertensive heart and chronic kidney disease with heart failure and stage 1 through stage 4 chronic kidney disease, or unspecified chronic kidney disease: Secondary | ICD-10-CM | POA: Diagnosis present

## 2023-08-24 DIAGNOSIS — E860 Dehydration: Secondary | ICD-10-CM | POA: Diagnosis present

## 2023-08-24 DIAGNOSIS — I129 Hypertensive chronic kidney disease with stage 1 through stage 4 chronic kidney disease, or unspecified chronic kidney disease: Secondary | ICD-10-CM | POA: Diagnosis not present

## 2023-08-24 DIAGNOSIS — Z8546 Personal history of malignant neoplasm of prostate: Secondary | ICD-10-CM | POA: Diagnosis not present

## 2023-08-24 DIAGNOSIS — Z8673 Personal history of transient ischemic attack (TIA), and cerebral infarction without residual deficits: Secondary | ICD-10-CM | POA: Diagnosis not present

## 2023-08-24 DIAGNOSIS — Z79899 Other long term (current) drug therapy: Secondary | ICD-10-CM

## 2023-08-24 DIAGNOSIS — C642 Malignant neoplasm of left kidney, except renal pelvis: Secondary | ICD-10-CM | POA: Diagnosis not present

## 2023-08-24 DIAGNOSIS — D649 Anemia, unspecified: Secondary | ICD-10-CM | POA: Diagnosis present

## 2023-08-24 DIAGNOSIS — Z7902 Long term (current) use of antithrombotics/antiplatelets: Secondary | ICD-10-CM | POA: Diagnosis not present

## 2023-08-24 DIAGNOSIS — N1832 Chronic kidney disease, stage 3b: Secondary | ICD-10-CM | POA: Diagnosis present

## 2023-08-24 DIAGNOSIS — N189 Chronic kidney disease, unspecified: Secondary | ICD-10-CM | POA: Diagnosis present

## 2023-08-24 DIAGNOSIS — I480 Paroxysmal atrial fibrillation: Secondary | ICD-10-CM | POA: Diagnosis present

## 2023-08-24 DIAGNOSIS — Z8249 Family history of ischemic heart disease and other diseases of the circulatory system: Secondary | ICD-10-CM | POA: Diagnosis not present

## 2023-08-24 DIAGNOSIS — I5032 Chronic diastolic (congestive) heart failure: Secondary | ICD-10-CM | POA: Diagnosis present

## 2023-08-24 DIAGNOSIS — N179 Acute kidney failure, unspecified: Secondary | ICD-10-CM | POA: Diagnosis present

## 2023-08-24 DIAGNOSIS — I700 Atherosclerosis of aorta: Secondary | ICD-10-CM | POA: Diagnosis present

## 2023-08-24 DIAGNOSIS — E785 Hyperlipidemia, unspecified: Secondary | ICD-10-CM | POA: Diagnosis present

## 2023-08-24 DIAGNOSIS — E1122 Type 2 diabetes mellitus with diabetic chronic kidney disease: Secondary | ICD-10-CM | POA: Diagnosis present

## 2023-08-24 DIAGNOSIS — D631 Anemia in chronic kidney disease: Secondary | ICD-10-CM | POA: Diagnosis present

## 2023-08-24 DIAGNOSIS — Z841 Family history of disorders of kidney and ureter: Secondary | ICD-10-CM | POA: Diagnosis not present

## 2023-08-24 DIAGNOSIS — N281 Cyst of kidney, acquired: Secondary | ICD-10-CM | POA: Diagnosis not present

## 2023-08-24 DIAGNOSIS — I10 Essential (primary) hypertension: Secondary | ICD-10-CM | POA: Diagnosis present

## 2023-08-24 DIAGNOSIS — Z833 Family history of diabetes mellitus: Secondary | ICD-10-CM

## 2023-08-24 DIAGNOSIS — Z803 Family history of malignant neoplasm of breast: Secondary | ICD-10-CM | POA: Diagnosis not present

## 2023-08-24 LAB — COMPREHENSIVE METABOLIC PANEL
ALT: 27 U/L (ref 0–44)
AST: 36 U/L (ref 15–41)
Albumin: 3.5 g/dL (ref 3.5–5.0)
Alkaline Phosphatase: 68 U/L (ref 38–126)
Anion gap: 14 (ref 5–15)
BUN: 93 mg/dL — ABNORMAL HIGH (ref 8–23)
CO2: 16 mmol/L — ABNORMAL LOW (ref 22–32)
Calcium: 9.4 mg/dL (ref 8.9–10.3)
Chloride: 108 mmol/L (ref 98–111)
Creatinine, Ser: 6.6 mg/dL — ABNORMAL HIGH (ref 0.61–1.24)
GFR, Estimated: 8 mL/min — ABNORMAL LOW (ref >60)
Glucose, Bld: 87 mg/dL (ref 70–99)
Potassium: 4.4 mmol/L (ref 3.5–5.1)
Sodium: 138 mmol/L (ref 135–145)
Total Bilirubin: 0.7 mg/dL (ref 0.0–1.2)
Total Protein: 7.8 g/dL (ref 6.5–8.1)

## 2023-08-24 LAB — CBC
HCT: 21.2 % — ABNORMAL LOW (ref 39.0–52.0)
Hemoglobin: 6.7 g/dL — ABNORMAL LOW (ref 13.0–17.0)
MCH: 27.9 pg (ref 26.0–34.0)
MCHC: 31.6 g/dL (ref 30.0–36.0)
MCV: 88.3 fL (ref 80.0–100.0)
Platelets: 195 10*3/uL (ref 150–400)
RBC: 2.4 MIL/uL — ABNORMAL LOW (ref 4.22–5.81)
RDW: 16.1 % — ABNORMAL HIGH (ref 11.5–15.5)
WBC: 3.4 10*3/uL — ABNORMAL LOW (ref 4.0–10.5)
nRBC: 0 % (ref 0.0–0.2)

## 2023-08-24 LAB — URINALYSIS, ROUTINE W REFLEX MICROSCOPIC
Bacteria, UA: NONE SEEN
Bilirubin Urine: NEGATIVE
Glucose, UA: NEGATIVE mg/dL
Ketones, ur: NEGATIVE mg/dL
Leukocytes,Ua: NEGATIVE
Nitrite: NEGATIVE
Protein, ur: 30 mg/dL — AB
RBC / HPF: 0 RBC/hpf (ref 0–5)
Specific Gravity, Urine: 1.011 (ref 1.005–1.030)
Squamous Epithelial / HPF: 0 /[HPF] (ref 0–5)
pH: 5 (ref 5.0–8.0)

## 2023-08-24 LAB — IRON AND TIBC
Iron: 112 ug/dL (ref 45–182)
Saturation Ratios: 44 % — ABNORMAL HIGH (ref 17.9–39.5)
TIBC: 255 ug/dL (ref 250–450)
UIBC: 143 ug/dL

## 2023-08-24 LAB — PREPARE RBC (CROSSMATCH)

## 2023-08-24 LAB — FERRITIN: Ferritin: 1602 ng/mL — ABNORMAL HIGH (ref 24–336)

## 2023-08-24 MED ORDER — TAMSULOSIN HCL 0.4 MG PO CAPS
0.4000 mg | ORAL_CAPSULE | Freq: Every day | ORAL | Status: DC
  Administered 2023-08-24 – 2023-08-28 (×5): 0.4 mg via ORAL
  Filled 2023-08-24 (×5): qty 1

## 2023-08-24 MED ORDER — SODIUM CHLORIDE 0.9 % IV BOLUS
1000.0000 mL | Freq: Once | INTRAVENOUS | Status: AC
  Administered 2023-08-24: 1000 mL via INTRAVENOUS

## 2023-08-24 MED ORDER — ENSURE ENLIVE PO LIQD
237.0000 mL | Freq: Two times a day (BID) | ORAL | Status: DC
  Administered 2023-08-25 – 2023-08-29 (×8): 237 mL via ORAL

## 2023-08-24 MED ORDER — POLYSACCHARIDE IRON COMPLEX 150 MG PO CAPS
150.0000 mg | ORAL_CAPSULE | Freq: Every day | ORAL | Status: DC
  Administered 2023-08-25 – 2023-08-29 (×5): 150 mg via ORAL
  Filled 2023-08-24 (×5): qty 1

## 2023-08-24 MED ORDER — ONDANSETRON HCL 4 MG/2ML IJ SOLN
4.0000 mg | Freq: Four times a day (QID) | INTRAMUSCULAR | Status: DC | PRN
Start: 2023-08-24 — End: 2023-08-29

## 2023-08-24 MED ORDER — ACETAMINOPHEN 650 MG RE SUPP: 650.0000 mg | Freq: Four times a day (QID) | RECTAL | Status: DC | PRN

## 2023-08-24 MED ORDER — LACTATED RINGERS IV SOLN: INTRAVENOUS | Status: AC

## 2023-08-24 MED ORDER — SODIUM CHLORIDE 0.9 % IV SOLN
10.0000 mL/h | Freq: Once | INTRAVENOUS | Status: AC
  Administered 2023-08-24: 10 mL/h via INTRAVENOUS

## 2023-08-24 MED ORDER — ROSUVASTATIN CALCIUM 10 MG PO TABS
40.0000 mg | ORAL_TABLET | Freq: Every day | ORAL | Status: DC
  Administered 2023-08-25 – 2023-08-29 (×5): 40 mg via ORAL
  Filled 2023-08-24 (×5): qty 4

## 2023-08-24 MED ORDER — POLYETHYLENE GLYCOL 3350 17 G PO PACK
17.0000 g | PACK | Freq: Every day | ORAL | Status: DC | PRN
Start: 2023-08-24 — End: 2023-08-29

## 2023-08-24 MED ORDER — ONDANSETRON HCL 4 MG PO TABS: 4.0000 mg | ORAL_TABLET | Freq: Four times a day (QID) | ORAL | Status: DC | PRN

## 2023-08-24 MED ORDER — MIRTAZAPINE 15 MG PO TABS
7.5000 mg | ORAL_TABLET | Freq: Every day | ORAL | Status: DC
Start: 2023-08-24 — End: 2023-08-29
  Administered 2023-08-24 – 2023-08-28 (×5): 7.5 mg via ORAL
  Filled 2023-08-24 (×5): qty 1

## 2023-08-24 MED ORDER — FOLIC ACID 1 MG PO TABS
1.0000 mg | ORAL_TABLET | Freq: Every day | ORAL | Status: DC
  Administered 2023-08-24 – 2023-08-29 (×6): 1 mg via ORAL
  Filled 2023-08-24 (×6): qty 1

## 2023-08-24 MED ORDER — LACTATED RINGERS IV SOLN: INTRAVENOUS | Status: DC

## 2023-08-24 MED ORDER — ACETAMINOPHEN 325 MG PO TABS: 650.0000 mg | ORAL_TABLET | Freq: Four times a day (QID) | ORAL | Status: DC | PRN

## 2023-08-24 MED ORDER — PREDNISONE 2.5 MG PO TABS
7.5000 mg | ORAL_TABLET | Freq: Every day | ORAL | Status: DC
  Filled 2023-08-24: qty 1

## 2023-08-24 NOTE — ED Triage Notes (Signed)
 Pt to ED for increased creatine, sent by doctor. Finished treatment for prostate treatment in november

## 2023-08-24 NOTE — ED Provider Triage Note (Signed)
 Emergency Medicine Provider Triage Evaluation Note  Michael Hinton. , a 73 y.o. male  was evaluated in triage.  Pt complains of elevated BUN and creatinine per his doctor.  Sent to the ED for treatment..  Review of Systems  Positive:  Negative:   Physical Exam  BP 115/62   Pulse 64   Temp 98.3 F (36.8 C)   Resp 17   Ht 5' 11 (1.803 m)   Wt 80 kg   SpO2 100%   BMI 24.60 kg/m  Gen:   Awake, no distress  Resp:  Normal effort  MSK:   Moves extremities without difficulty  Other:    Medical Decision Making  Medically screening exam initiated at 12:04 PM.  Appropriate orders placed.  Michael Hinton. was informed that the remainder of the evaluation will be completed by another provider, this initial triage assessment does not replace that evaluation, and the importance of remaining in the ED until their evaluation is complete.  Did go to the lobby to discuss lab results with patient and his family member.  He states he will not leave.  Did explain to him he is a level 2 should come back before some of the other level 3's.  They state they understand.  Patient continues to appear stable at this time   Gasper Devere ORN, DEVONNA 08/24/23 1205

## 2023-08-24 NOTE — H&P (Addendum)
 History and Physical    Patient: Michael Hinton. FMW:[id number] DOB: Oct 22, 1950 DOA: 08/24/2023 DOS: the patient was seen and examined on 08/24/2023 PCP: Carin Gauze, NP  Patient coming from: Home  Chief Complaint: No chief complaint on file.  HPI: Michael Hinton. is a 73 y.o. male with medical history significant of CKD stage IIIb, Renal cell carcinoma s/p laparoscopic hand-assisted left radical nephrectomy (December 2023), rheumatoid arthritis, anemia of chronic renal disease, hypertension, atrial fibrillation not on AC, CVA, HFpEF, prostate cancer, type 2 diabetes, who presents to the ED due to abnormal labs.  Michael Hinton states that for the last 1 month, he has been experiencing generalized malaise, weakness and poor appetite.  He endorses metallic taste in his mouth.  He denies any sudden changes in his symptoms.  He denies any chest pain, shortness of breath, orthopnea, lower extremity swelling, dysuria, hematuria, or difficulty with urination.  He feels that he has actually been urinating more often.  He has been taken off his amlodipine  due to low blood pressures but continues to be on losartan .  ED course: On arrival to the ED, patient was normotensive at 115/62 with heart rate of 64.  He was saturating at 100% on room air.  He was afebrile at 98.3.  Initial workup notable for hemoglobin of 6.7, bicarb 16, BUN 93, creatinine 6.60 with GFR of 8.  Renal ultrasound was ordered.  Patient started on IV fluids and 1 unit of packed RBC ordered.  TRH contacted for admission.  Review of Systems: As mentioned in the history of present illness. All other systems reviewed and are negative.  Past Medical History:  Diagnosis Date   Anemia    Aortic atherosclerosis (HCC)    Atrial fibrillation (HCC)    a.) CHA2DS2VASc = 5 (age, HTN, CVA x 2, vascular disease history);  b.) rate/rhythm maintained without pharmacological intervention; chronically anticoagulated with clopidogrel    Bilateral  renal cysts 05/29/2022   Carotid artery disease (HCC) 04/11/2022   a.) doppler 04/11/2022: <50 LICA, no sig RICA.   CVA (cerebral vascular accident) [facility])    a.) MRI brain 01/14/2022: numerous chronic cerebellar infarcts; b.) CT head 04/10/2022: small old lacunar infarcts are seen in cerebellum bilaterally   Diastolic dysfunction 04/11/2022   a.) TTE 04/11/2022: EF 55-60%, mild BAE, mild MR, G1DD.   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 His smoking use included cigarettes. He started smoking about 52 years ago. He has a 3.5 pack-year smoking history. He has been exposed to tobacco smoke. He has never used smokeless tobacco. He reports that he does not currently use alcohol. He reports that he does not use drugs.  No Known Allergies  Family History  Problem Relation Age of Onset   Heart disease Mother    Diabetes Mother     Breast cancer Mother    Kidney disease Father    Diabetes Father    Diabetes Daughter     Prior to Admission medications   Medication Sig Start Date End Date Taking? Authorizing Provider  amLODipine  (NORVASC ) 10 MG tablet Take 1 tablet (10 mg total) by mouth every morning. 06/14/23   Scoggins, Amber, NP  Cholecalciferol (VITAMIN D -3) 125 MCG (5000 UT) TABS Take 1 tablet by mouth daily. 07/01/23   Scoggins, Amber, NP  Ferrous Sulfate  (IRON  PO) Take 1 tablet by mouth daily.    [provider]  folic acid  (FOLVITE ) 1 MG tablet Take 1 tablet (1 mg total) by mouth daily. 07/01/23   Scoggins, Amber, NP  losartan  (COZAAR ) 50 MG tablet Take 1 tablet (50 mg total) by mouth daily. 06/01/23   Scoggins, Amber, NP  mirtazapine  (REMERON ) 7.5 MG tablet Take 1 tablet (7.5 mg total) by mouth at bedtime. 08/19/23   Scoggins, Amber, NP  predniSONE  (DELTASONE ) 5 MG tablet Take 2 tablets (10 mg total) by mouth daily x2 weeks, 1.5 tablets (7.5 mg total) daily x2 weeks, and then continue 1 tablet (5 mg) daily. 08/06/23   Cheryl Waddell HERO, PA-C  rosuvastatin  (CRESTOR ) 40 MG tablet Take 1 tablet (40 mg total) by mouth daily. 07/01/23   Scoggins, Amber, NP  tamsulosin  (FLOMAX ) 0.4 MG CAPS capsule Take 1 capsule (0.4 mg total) by mouth daily after supper. 06/15/23   Lenn Aran, MD  triamcinolone  ointment (KENALOG ) 0.5 % Apply 1 Application topically 2 (two) times daily. 07/27/23   Carin Gauze, NP    Physical Exam: Vitals:   08/24/23 1400 08/24/23 1524 08/24/23 1600 08/24/23 1715  BP: 136/71  (!) 145/71 122/81  Pulse: (!) 58  (!) 58 (!) 59  Resp: (!) 24  14 19   Temp:  98.3 F (36.8 C)  97.9 F (36.6 C)  TempSrc:    Oral  SpO2: 100%  100% 100%  Weight:      Height:       Physical Exam Vitals and nursing note reviewed.  Constitutional:      General: He is not in acute distress.    Appearance: He is normal weight. He is not toxic-appearing.  HENT:     Head: Normocephalic and atraumatic.  Eyes:      Conjunctiva/sclera: Conjunctivae normal.     Pupils: Pupils are equal, round, and reactive to light.  Cardiovascular:     Rate and Rhythm: Normal rate and regular rhythm.     Heart sounds: No murmur heard.    No gallop.  Pulmonary:     Effort: Pulmonary effort is normal. No respiratory distress.     Breath sounds: Normal breath sounds. No wheezing, rhonchi or rales.  Abdominal:     General: Bowel sounds are normal. There is no distension.     Palpations: Abdomen is soft.     Tenderness: There is no abdominal tenderness. There is no guarding.  Musculoskeletal:     Right lower leg: No edema.  Left lower leg: No edema.  Skin:    General: Skin is warm and dry.  Neurological:     General: No focal deficit present.     Mental Status: He is alert and oriented to person, place, and time. Mental status is at baseline.  Psychiatric:        Mood and Affect: Mood normal.        Behavior: Behavior normal.    Data Reviewed: CBC with WBC of 3.4, hemoglobin of 6.7, MCV of 88, platelets 195 CMP with sodium of 130, potassium 4.4, bicarb 16, anion gap 14, BUN 93, creatinine 6.60, AST 36, AST 27 and GFR of 8  Renal function panel obtained on 08/19/2023 with BUN of 75, creatinine 8.13 and GFR of 7. CBC obtained on 08/19/2023 with hemoglobin of 7.1  US  Renal Result Date: 08/24/2023 CLINICAL DATA:  Acute on chronic renal failure. EXAM: RENAL / URINARY TRACT ULTRASOUND COMPLETE COMPARISON:  CT scan abdomen and pelvis from 07/14/2023. FINDINGS: Right Kidney: Renal measurements: 5.8 x 6.6 x 11.9 cm = volume: 236.2 mL. There is increased cortical echogenicity, nonspecific but commonly seen with medical renal disease. There is probable subtle fullness in the renal pelvis however, no frank hydronephrosis. No contour deforming mass or stone large enough to cause acoustic shadowing. There is a simple cyst in the lower pole measuring 1.7 x 2.2 x 3.1 cm. Left Kidney: Surgically absent. Bladder: Appears normal  for degree of bladder distention. Prevoid urinary volume- 444 mL. Postvoid residual urine volume-289 mL. Other: None. IMPRESSION: 1. Surgically absent left kidney. 2. Increased right renal cortical echogenicity, nonspecific but commonly seen with medical renal disease. 3. There is a 3.1 cm simple cyst in the right kidney lower pole. 4. Otherwise unremarkable exam. Electronically Signed   By: Ree Molt M.D.   On: 08/24/2023 16:53   Results are pending, will review when available.  Assessment and Plan:  * Acute on chronic renal failure (HCC) History of CKD stage IIIb with postoperative creatinine initially between 1.6 and 1.8, however increased to 2.65 2 weeks ago and then up to 8.13 on 1/9.  Now improved somewhat to 6.6.  Unclear etiology although likely multifactorial; patient endorses poor p.o. intake + his postvoid residual was 288 cc.  - Nephrology consulted; appreciate their recommendations - No current indication for emergent dialysis - S/p 1 L bolus - Continue maintenance fluids - Renal ultrasound pending - Urinalysis pending - Repeat BMP in the a.m. - Hold home nephrotoxic agents - Strict in and out - In-N-Out as needed for PVR over 250 cc  Acute on chronic anemia Likely acute worsening in the setting of worsening renal failure.  No evidence of bleeding on exam.  - 1 unit ordered in ED - Posttransfusion CBC pending - Continue to transfuse for hemoglobin less than 7 - Obtain iron  panel  Atrial fibrillation (HCC) CHA2DS2-VASc of 5, however patient is not currently on anticoagulation.  Will hold off on starting at this time.   Clear cell renal cell carcinoma, left (HCC) History of left renal cell carcinoma s/p laparoscopic hand-assisted nephrectomy in December 2023.  No evidence of recurrence.  Essential hypertension - Hold home losartan  in the setting of AKI - Hold home amlodipine  given discontinued by PCP 1 week prior  Rheumatoid arthritis involving multiple sites  with positive rheumatoid factor (HCC) Patient is in the process of restarting Orencia , however he has not started this yet due to need for TB testing.  -Continue home prednisone  7.5 mg  daily  Advance Care Planning:   Code Status: Full Code verified by patient with wife at bedside  Consults: Nephrology  Family Communication: Patient's wife updated at bedside.   Severity of Illness: The appropriate patient status for this patient is OBSERVATION. Observation status is judged to be reasonable and necessary in order to provide the required intensity of service to ensure the patient's safety. The patient's presenting symptoms, physical exam findings, and initial radiographic and laboratory data in the context of their medical condition is felt to place them at decreased risk for further clinical deterioration. Furthermore, it is anticipated that the patient will be medically stable for discharge from the hospital within 2 midnights of admission.   Author: Clayborne Broom, MD 08/24/2023 5:18 PM  For on call review [email or web address].

## 2023-08-24 NOTE — Assessment & Plan Note (Signed)
 CHA2DS2-VASc of 5, however patient is not currently on anticoagulation.  Will hold off on starting at this time.

## 2023-08-24 NOTE — Assessment & Plan Note (Addendum)
 Patient is in the process of restarting Orencia, however he has not started this yet due to need for TB testing.  -Continue home prednisone 7.5 mg daily

## 2023-08-24 NOTE — Assessment & Plan Note (Signed)
 History of left renal cell carcinoma s/p laparoscopic hand-assisted nephrectomy in December 2023.  No evidence of recurrence.

## 2023-08-24 NOTE — Assessment & Plan Note (Addendum)
 History of CKD stage IIIb with postoperative creatinine initially between 1.6 and 1.8, however increased to 2.65 2 weeks ago and then up to 8.13 on 1/9.  Now improved somewhat to 6.6.  Unclear etiology although likely multifactorial; patient endorses poor p.o. intake + his postvoid residual was 288 cc.  - Nephrology consulted; appreciate their recommendations - No current indication for emergent dialysis - S/p 1 L bolus - Continue maintenance fluids - Renal ultrasound pending - Urinalysis pending - Repeat BMP in the a.m. - Hold home nephrotoxic agents - Strict in and out - In-N-Out as needed for PVR over 250 cc

## 2023-08-24 NOTE — Assessment & Plan Note (Addendum)
-   Hold home losartan in the setting of AKI - Hold home amlodipine given discontinued by PCP 1 week prior

## 2023-08-24 NOTE — ED Provider Notes (Signed)
 [facility] Provider Note    Event Date/Time   First MD Initiated Contact with Patient 08/24/23 1326     (approximate)   History   No chief complaint on file.   HPI  Michael Hinton. is a 73 year old male with history of CKD, renal cell carcinoma, hypertension presenting to the emergency department for evaluation of elevated creatinine and anemia.  Patient had a routine visit with his nephrologist on 1/9.  He had lab work done demonstrating a creatinine of 8.13 and hemoglobin of 7.1 for which patient was recommended that he present to the ER.  Patient reports that he has had some ongoing poor appetite for which he started on mirtazapine  by his primary care doctor.  No vomiting, but has had limited p.o. intake.  Denies abdominal pain, diarrhea, blood in stool.  Denies dysuria, difficulty emptying bladder, change in urination patterns.     Physical Exam   Triage Vital Signs: ED Triage Vitals [08/24/23 1113]  Encounter Vitals Group     BP 115/62     Systolic BP Percentile      Diastolic BP Percentile      Pulse Rate 64     Resp 17     Temp 98.3 F (36.8 C)     Temp src      SpO2 100 %     Weight 176 lb 5.9 oz (80 kg)     Height 5' 11 (1.803 m)     Head Circumference      Peak Flow      Pain Score 0     Pain Loc      Pain Education      Exclude from Growth Chart     Most recent vital signs: Vitals:   08/24/23 1400 08/24/23 1524  BP: 136/71   Pulse: (!) 58   Resp: (!) 24   Temp:  98.3 F (36.8 C)  SpO2: 100%      General: Awake, interactive  CV:  Regular rate, good peripheral perfusion.  Resp:  Unlabored respirations, lungs clear to auscultation Abd:  Nondistended, soft, nontender to palpation Neuro:  Symmetric facial movement, fluid speech   ED Results / Procedures / Treatments   Labs (all labs ordered are listed, but only abnormal results are displayed) Labs Reviewed  CBC - Abnormal; Notable for the following components:       Result Value   WBC 3.4 (*)    RBC 2.40 (*)    Hemoglobin 6.7 (*)    HCT 21.2 (*)    RDW 16.1 (*)    All other components within normal limits  COMPREHENSIVE METABOLIC PANEL - Abnormal; Notable for the following components:   CO2 16 (*)    BUN 93 (*)    Creatinine, Ser 6.60 (*)    GFR, Estimated 8 (*)    All other components within normal limits  URINALYSIS, ROUTINE W REFLEX MICROSCOPIC  PREPARE RBC (CROSSMATCH)  TYPE AND SCREEN     EKG EKG independently reviewed interpreted by myself (ER attending) demonstrates:    RADIOLOGY Imaging independently reviewed and interpreted by myself demonstrates:  Renal ultrasound pending  PROCEDURES:  Critical Care performed: Yes, see critical care procedure note(s)  CRITICAL CARE Performed by: Nilsa Dade   Total critical care time: 30 minutes  Critical care time was exclusive of separately billable procedures and treating other patients.  Critical care was necessary to treat or prevent imminent or life-threatening deterioration.  Critical care was  time spent personally by me on the following activities: development of treatment plan with patient and/or surrogate as well as nursing, discussions with consultants, evaluation of patient's response to treatment, examination of patient, obtaining history from patient or surrogate, ordering and performing treatments and interventions, ordering and review of laboratory studies, ordering and review of radiographic studies, pulse oximetry and re-evaluation of patient's condition.   Procedures   MEDICATIONS ORDERED IN ED: Medications  0.9 %  sodium chloride  infusion (has no administration in time range)  sodium chloride  0.9 % bolus 1,000 mL (1,000 mLs Intravenous New Bag/Given 08/24/23 1518)     IMPRESSION / MDM / ASSESSMENT AND PLAN / ED COURSE  I reviewed the triage vital signs and the nursing notes.  Differential diagnosis includes, but is not limited to, acute blood loss anemia,  anemia of chronic disease, medication adverse effect, obstructive pathology, intrinsic renal pathology, dehydration causing prerenal injury  Patient's presentation is most consistent with acute presentation with potential threat to life or bodily function.  73 year old male presenting to the emergency department for evaluation of abnormal labs in the setting of decreased appetite.  Repeat labs today they demonstrated significant but improved renal injury with creatinine of 6.6, slightly worsened anemia with hemoglobin of 6.7.  With hemoglobin drop under 7, did discuss blood transfusion and patient is agreeable.  Renal ultrasound ordered.  Do think he is appropriate for admission for further evaluation of his acute renal injury.  Will reach out to hospitalist team.  Case reviewed with Dr. Arnett.  She will evaluate the patient for anticipated admission.      FINAL CLINICAL IMPRESSION(S) / ED DIAGNOSES   Final diagnoses:  Acute renal failure superimposed on chronic kidney disease, unspecified acute renal failure type, unspecified CKD stage (HCC)  Anemia, unspecified type     Rx / DC Orders   ED Discharge Orders     None        Note:  This document was prepared using Dragon voice recognition software and may include unintentional dictation errors.   Levander Slate, MD 08/24/23 [phone]

## 2023-08-24 NOTE — Assessment & Plan Note (Signed)
 Likely acute worsening in the setting of worsening renal failure.  No evidence of bleeding on exam.  - 1 unit ordered in ED - Posttransfusion CBC pending - Continue to transfuse for hemoglobin less than 7 - Obtain iron panel

## 2023-08-24 NOTE — ED Notes (Signed)
 Waiting for pharmacy to verify medications.

## 2023-08-25 DIAGNOSIS — D631 Anemia in chronic kidney disease: Secondary | ICD-10-CM | POA: Diagnosis present

## 2023-08-25 DIAGNOSIS — I480 Paroxysmal atrial fibrillation: Secondary | ICD-10-CM | POA: Diagnosis present

## 2023-08-25 DIAGNOSIS — E1122 Type 2 diabetes mellitus with diabetic chronic kidney disease: Secondary | ICD-10-CM | POA: Diagnosis present

## 2023-08-25 DIAGNOSIS — Z905 Acquired absence of kidney: Secondary | ICD-10-CM | POA: Diagnosis not present

## 2023-08-25 DIAGNOSIS — Z7902 Long term (current) use of antithrombotics/antiplatelets: Secondary | ICD-10-CM | POA: Diagnosis not present

## 2023-08-25 DIAGNOSIS — M0579 Rheumatoid arthritis with rheumatoid factor of multiple sites without organ or systems involvement: Secondary | ICD-10-CM | POA: Diagnosis present

## 2023-08-25 DIAGNOSIS — N179 Acute kidney failure, unspecified: Secondary | ICD-10-CM | POA: Diagnosis present

## 2023-08-25 DIAGNOSIS — I700 Atherosclerosis of aorta: Secondary | ICD-10-CM | POA: Diagnosis present

## 2023-08-25 DIAGNOSIS — E8721 Acute metabolic acidosis: Secondary | ICD-10-CM | POA: Diagnosis present

## 2023-08-25 DIAGNOSIS — Z833 Family history of diabetes mellitus: Secondary | ICD-10-CM | POA: Diagnosis not present

## 2023-08-25 DIAGNOSIS — H9190 Unspecified hearing loss, unspecified ear: Secondary | ICD-10-CM | POA: Diagnosis present

## 2023-08-25 DIAGNOSIS — E785 Hyperlipidemia, unspecified: Secondary | ICD-10-CM | POA: Diagnosis present

## 2023-08-25 DIAGNOSIS — Z8546 Personal history of malignant neoplasm of prostate: Secondary | ICD-10-CM | POA: Diagnosis not present

## 2023-08-25 DIAGNOSIS — Z7952 Long term (current) use of systemic steroids: Secondary | ICD-10-CM | POA: Diagnosis not present

## 2023-08-25 DIAGNOSIS — I5032 Chronic diastolic (congestive) heart failure: Secondary | ICD-10-CM | POA: Diagnosis present

## 2023-08-25 DIAGNOSIS — Z841 Family history of disorders of kidney and ureter: Secondary | ICD-10-CM | POA: Diagnosis not present

## 2023-08-25 DIAGNOSIS — I130 Hypertensive heart and chronic kidney disease with heart failure and stage 1 through stage 4 chronic kidney disease, or unspecified chronic kidney disease: Secondary | ICD-10-CM | POA: Diagnosis present

## 2023-08-25 DIAGNOSIS — Z85528 Personal history of other malignant neoplasm of kidney: Secondary | ICD-10-CM | POA: Diagnosis not present

## 2023-08-25 DIAGNOSIS — Z803 Family history of malignant neoplasm of breast: Secondary | ICD-10-CM | POA: Diagnosis not present

## 2023-08-25 DIAGNOSIS — Z8673 Personal history of transient ischemic attack (TIA), and cerebral infarction without residual deficits: Secondary | ICD-10-CM | POA: Diagnosis not present

## 2023-08-25 DIAGNOSIS — N1832 Chronic kidney disease, stage 3b: Secondary | ICD-10-CM | POA: Diagnosis present

## 2023-08-25 DIAGNOSIS — E860 Dehydration: Secondary | ICD-10-CM | POA: Diagnosis present

## 2023-08-25 DIAGNOSIS — Z87891 Personal history of nicotine dependence: Secondary | ICD-10-CM | POA: Diagnosis not present

## 2023-08-25 DIAGNOSIS — Z8249 Family history of ischemic heart disease and other diseases of the circulatory system: Secondary | ICD-10-CM | POA: Diagnosis not present

## 2023-08-25 LAB — TYPE AND SCREEN
ABO/RH(D): B POS
Antibody Screen: NEGATIVE
Unit division: 0

## 2023-08-25 LAB — BASIC METABOLIC PANEL
Anion gap: 10 (ref 5–15)
BUN: 79 mg/dL — ABNORMAL HIGH (ref 8–23)
CO2: 17 mmol/L — ABNORMAL LOW (ref 22–32)
Calcium: 9.1 mg/dL (ref 8.9–10.3)
Chloride: 115 mmol/L — ABNORMAL HIGH (ref 98–111)
Creatinine, Ser: 5.52 mg/dL — ABNORMAL HIGH (ref 0.61–1.24)
GFR, Estimated: 10 mL/min — ABNORMAL LOW (ref >60)
Glucose, Bld: 73 mg/dL (ref 70–99)
Potassium: 4.9 mmol/L (ref 3.5–5.1)
Sodium: 142 mmol/L (ref 135–145)

## 2023-08-25 LAB — CBC WITH DIFFERENTIAL/PLATELET
Abs Immature Granulocytes: 0.03 10*3/uL (ref 0.00–0.07)
Basophils Absolute: 0 10*3/uL (ref 0.0–0.1)
Basophils Relative: 0 %
Eosinophils Absolute: 0.1 10*3/uL (ref 0.0–0.5)
Eosinophils Relative: 3 %
HCT: 23.7 % — ABNORMAL LOW (ref 39.0–52.0)
Hemoglobin: 7.7 g/dL — ABNORMAL LOW (ref 13.0–17.0)
Immature Granulocytes: 1 %
Lymphocytes Relative: 14 %
Lymphs Abs: 0.4 10*3/uL — ABNORMAL LOW (ref 0.7–4.0)
MCH: 28.3 pg (ref 26.0–34.0)
MCHC: 32.5 g/dL (ref 30.0–36.0)
MCV: 87.1 fL (ref 80.0–100.0)
Monocytes Absolute: 0.3 10*3/uL (ref 0.1–1.0)
Monocytes Relative: 12 %
Neutro Abs: 1.9 10*3/uL (ref 1.7–7.7)
Neutrophils Relative %: 70 %
Platelets: 174 10*3/uL (ref 150–400)
RBC: 2.72 MIL/uL — ABNORMAL LOW (ref 4.22–5.81)
RDW: 16.2 % — ABNORMAL HIGH (ref 11.5–15.5)
WBC: 2.7 10*3/uL — ABNORMAL LOW (ref 4.0–10.5)
nRBC: 0 % (ref 0.0–0.2)

## 2023-08-25 LAB — BPAM RBC
Blood Product Expiration Date: 202501272359
ISSUE DATE / TIME: 202501141708
Unit Type and Rh: 7300

## 2023-08-25 LAB — VITAMIN B12: Vitamin B-12: 901 pg/mL (ref 180–914)

## 2023-08-25 LAB — HIV ANTIBODY (ROUTINE TESTING W REFLEX): HIV Screen 4th Generation wRfx: NONREACTIVE

## 2023-08-25 MED ORDER — PREDNISONE 10 MG PO TABS: 5.0000 mg | ORAL_TABLET | Freq: Every day | ORAL | Status: DC

## 2023-08-25 MED ORDER — SODIUM BICARBONATE 8.4 % IV SOLN
INTRAVENOUS | Status: AC
  Filled 2023-08-25: qty 150
  Filled 2023-08-25 (×3): qty 1000

## 2023-08-25 MED ORDER — PREDNISONE 5 MG PO TABS
7.5000 mg | ORAL_TABLET | Freq: Every day | ORAL | Status: DC
  Administered 2023-08-25 – 2023-08-29 (×5): 7.5 mg via ORAL
  Filled 2023-08-25 (×5): qty 1

## 2023-08-25 NOTE — Progress Notes (Signed)
 [facility]  ROUNDING NOTE   Subjective:   Michael Hinton. Is a 73 y.o. male with past medical history that includes renal cell carcinoma s/p left radical nephrectomy (Dec 2023), RA, anemia, hypertension, a fib, diabetes, prostate cancer, and chronic kidney disease stage 3b. Patient presents to the ED at the advice of his nephrologist, Dr Michael Hinton, due to abnormal labs. Patient has been admitted for Acute on chronic renal failure (HCC) [N17.9, N18.9] Anemia, unspecified type [D64.9] Acute renal failure superimposed on chronic kidney disease, unspecified acute renal failure type, unspecified CKD stage (HCC) [N17.9, N18.9]  Patient is known to our practice and follows Dr Michael Hinton outpatient. He was seen in office on 08/19/23 for routine follow up. Labs drawn during that visit showed an decrease in renal function. Patient states he had some poor appetite but denied nausea, vomiting, or diarrhea. Remains on room air with no shortness of breath. No lower extremity edema.   Labs on ED arrival concerning for creatinine 6.6 with GFR 8, S bicarb, 16, Hgb 6.7 with white count 3.4. UA appears clear.  Renal US  confirms absence of left kidney and 3.1cm simple cyst on lower pole of right kidney.   We have been consulted to manage acute kidney injury.    Objective:  Vital signs in last 24 hours:  Temp:  [97.8 F (36.6 C)-99.2 F (37.3 C)] 98.4 F (36.9 C) (01/15 1500) Pulse Rate:  [56-67] 66 (01/15 1509) Resp:  [14-20] 16 (01/15 1509) BP: (114-145)/(63-81) 119/69 (01/15 1509) SpO2:  [99 %-100 %] 100 % (01/15 1509) Weight:  [82.4 kg] 82.4 kg (01/14 2049)  Weight change:  Filed Weights   08/24/23 1113 08/24/23 2049  Weight: 80 kg 82.4 kg    Intake/Output: I/O last 3 completed shifts: In: 1757.5 [I.V.:1117.5; Blood:640] Out: 800 [Urine:800]   Intake/Output this shift:  Total I/O In: 615.8 [P.O.:480; I.V.:135.8] Out: 675 [Urine:675]  Physical Exam: General: NAD  Head:  Normocephalic, atraumatic. Moist oral mucosal membranes  Eyes: Anicteric  Lungs:  Clear to auscultation, normal effort  Heart: Regular rate and rhythm  Abdomen:  Soft, nontender,   Extremities:  No peripheral edema.  Neurologic: Nonfocal, moving all four extremities  Skin: No lesions       Basic Metabolic Panel: Recent Labs  Lab 08/24/23 1117 08/25/23 0538  NA 138 142  K 4.4 4.9  CL 108 115*  CO2 16* 17*  GLUCOSE 87 73  BUN 93* 79*  CREATININE 6.60* 5.52*  CALCIUM  9.4 9.1    Liver Function Tests: Recent Labs  Lab 08/24/23 1117  AST 36  ALT 27  ALKPHOS 68  BILITOT 0.7  PROT 7.8  ALBUMIN 3.5   No results for input(s): "LIPASE", "AMYLASE" in the last 168 hours. No results for input(s): "AMMONIA" in the last 168 hours.  CBC: Recent Labs  Lab 08/24/23 1117 08/25/23 0538  WBC 3.4* 2.7*  NEUTROABS  --  1.9  HGB 6.7* 7.7*  HCT 21.2* 23.7*  MCV 88.3 87.1  PLT 195 174    Cardiac Enzymes: No results for input(s): "CKTOTAL", "CKMB", "CKMBINDEX", "TROPONINI" in the last 168 hours.  BNP: Invalid input(s): "POCBNP"  CBG: No results for input(s): "GLUCAP" in the last 168 hours.  Microbiology: Results for orders placed or performed during the hospital encounter of 07/07/22  Urine Culture     Status: None   Collection Time: 07/07/22  9:41 AM   Specimen: Urine, Clean Catch  Result Value Ref Range Status   Specimen  Description   Final    URINE, CLEAN CATCH Performed at [facility], [address] [place], [place] [address]    Special Requests   Final    NONE Performed at [facility], [address], [place], [place] [address]    Culture   Final    NO GROWTH Performed at [facility], [address] [place], [place] [address]    Report Status 07/08/2022 FINAL  Final    Coagulation Studies: No results for input(s): "LABPROT", "INR" in the last 72 hours.  Urinalysis: Recent Labs    08/24/23 2025  COLORURINE STRAW*   LABSPEC 1.011  PHURINE 5.0  GLUCOSEU NEGATIVE  HGBUR MODERATE*  BILIRUBINUR NEGATIVE  KETONESUR NEGATIVE  PROTEINUR 30*  NITRITE NEGATIVE  LEUKOCYTESUR NEGATIVE      Imaging: US  Renal Result Date: 08/24/2023 CLINICAL DATA:  Acute on chronic renal failure. EXAM: RENAL / URINARY TRACT ULTRASOUND COMPLETE COMPARISON:  CT scan abdomen and pelvis from 07/14/2023. FINDINGS: Right Kidney: Renal measurements: 5.8 x 6.6 x 11.9 cm = volume: 236.2 mL. There is increased cortical echogenicity, nonspecific but commonly seen with medical renal disease. There is probable subtle fullness in the renal pelvis however, no frank hydronephrosis. No contour deforming mass or stone large enough to cause acoustic shadowing. There is a simple cyst in the lower pole measuring 1.7 x 2.2 x 3.1 cm. Left Kidney: Surgically absent. Bladder: Appears normal for degree of bladder distention. Prevoid urinary volume- 444 mL. Postvoid residual urine volume-289 mL. Other: None. IMPRESSION: 1. Surgically absent left kidney. 2. Increased right renal cortical echogenicity, nonspecific but commonly seen with medical renal disease. 3. There is a 3.1 cm simple cyst in the right kidney lower pole. 4. Otherwise unremarkable exam. Electronically Signed   By: Beula Brunswick M.D.   On: 08/24/2023 16:53     Medications:     feeding supplement  237 mL Oral BID BM   folic acid   1 mg Oral Daily   iron  polysaccharides  150 mg Oral Daily   mirtazapine   7.5 mg Oral QHS   predniSONE   7.5 mg Oral Q breakfast   Followed by   Cecily Cohen ON 09/08/2023] predniSONE   5 mg Oral Q breakfast   rosuvastatin   40 mg Oral Daily   tamsulosin   0.4 mg Oral QPC supper   acetaminophen  **OR** acetaminophen , ondansetron  **OR** ondansetron  (ZOFRAN ) IV, polyethylene glycol  Assessment/ Plan:  [name]. is a 73 y.o.  male  with past medical history that includes renal cell carcinoma s/p left radical nephrectomy (Dec 2023), RA, anemia, hypertension, a  fib, diabetes, prostate cancer, and chronic kidney disease stage 3b. Patient presents to the ED at the advice of his nephrologist, Dr Michael Hinton, due to abnormal labs. Patient has been admitted for Acute on chronic renal failure (HCC) [N17.9, N18.9] Anemia, unspecified type [D64.9] Acute renal failure superimposed on chronic kidney disease, unspecified acute renal failure type, unspecified CKD stage (HCC) [N17.9, N18.9]   Acute Kidney Injury on chronic kidney disease stage IIIb with baseline creatinine 1.88 and GFR of 38 on 04/05/23.  Acute kidney injury secondary to dehydration, poor oral intake Chronic kidney disease is secondary to hypertension and diabetes.  Renal ultrasound negative for obstruction, 3.1 cm cyst on right kidney pole. No IV contrast exposure. Creatinine has improved with NS bolus. No acute indication for dialysis.   Lab Results  Component Value Date   CREATININE 5.52 (H) 08/25/2023   CREATININE 6.60 (H) 08/24/2023  CREATININE 2.65 (H) 08/06/2023    Intake/Output Summary (Last 24 hours) at 08/25/2023 1522 Last data filed at 08/25/2023 1421 Gross per 24 hour  Intake 2373.33 ml  Output 1475 ml  Net 898.33 ml   2. Acute metabolic acidosis. S bicarb 16. Slowly improving. Will continue to monitor and assess need for supplementation.   3. Anemia of chronic kidney disease Lab Results  Component Value Date   HGB 7.7 (L) 08/25/2023    Hgb below desired range, but improved since admission. Patient received a 1 unit blood transfusion during this admission.     LOS: 0 Michael Hinton 1/15/20253:22 PM

## 2023-08-25 NOTE — Progress Notes (Addendum)
Progress Note    Michael Hinton.  VHQ:[id number] DOB: 12/01/1950  DOA: 08/24/2023 PCP: Marisue Ivan, NP      Brief Narrative:    Medical records reviewed and are as summarized below:  Michael Edouard. is a 73 y.o. male  with medical history significant of CKD stage IIIb, Renal cell carcinoma s/p laparoscopic hand-assisted left radical nephrectomy (December 2023), rheumatoid arthritis, anemia of chronic renal disease, hypertension, atrial fibrillation not on [facility], CVA, HFpEF, prostate cancer (completed treatment November 2024), type 2 diabetes, who presented to the hospital because of abnormal labs (elevated creatinine).  Michael Hinton complained of general malaise, general weakness and poor appetite.  In the ED, his creatinine was 6.6 and BUN was 93 with GFR of 80.       Assessment/Plan:   Principal Problem:   Acute on chronic renal failure (HCC) Active Problems:   Acute on chronic anemia   Rheumatoid arthritis involving multiple sites with positive rheumatoid factor (HCC)   Essential hypertension   Atrial fibrillation (HCC)   Body mass index is 25.34 kg/m.    AKI on CKD stage IIIb, normal anion gap metabolic acidosis: Creatinine is slowly improving with IV fluids.  Creatinine down from 6.6-5.52.  Michael Hinton has been started on a sodium bicarbonate infusion by the nephrologist.   Monitor BMP. Losartan on hold.   Acute on chronic anemia: Hemoglobin improved from 6.7-7.7.  S/p transfusion with 1 unit of PRBCs.   Paroxysmal atrial fibrillation: Heart rate is okay.  Patient is not on long-term anticoagulation.   Rheumatoid arthritis, on chronic prednisone   Comorbidities include chronic HFpEF, history of left renal cell carcinoma s/p left nephrectomy, history of prostate cancer, history of stroke, hypertension     Diet Order             Diet regular Room service appropriate? Yes; Fluid consistency: Thin  Diet effective now                             Consultants: Nephrologist  Procedures: None    Medications:    feeding supplement  237 mL Oral BID BM   folic acid  1 mg Oral Daily   iron polysaccharides  150 mg Oral Daily   mirtazapine  7.5 mg Oral QHS   predniSONE  7.5 mg Oral Q breakfast   Followed by   Melene Muller ON 09/08/2023] predniSONE  5 mg Oral Q breakfast   rosuvastatin  40 mg Oral Daily   tamsulosin  0.4 mg Oral QPC supper   Continuous Infusions:  sodium bicarbonate 150 mEq in dextrose 5 % 1,150 mL infusion       Anti-infectives (From admission, onward)    None              Family Communication/Anticipated D/C date and plan/Code Status   DVT prophylaxis: SCDs Start: 08/24/23 1635     Code Status: Full Code  Family Communication: None Disposition Plan: Plan to discharge home   Status is: Inpatient Remains inpatient appropriate because: AKI         Subjective:   Interval events noted.  No complaints.  Michael Hinton feels better.  No shortness of breath or chest pain.  Michael Hinton has adequate urine output.  Objective:    Vitals:   08/25/23 0506 08/25/23 0735 08/25/23 1500 08/25/23 1509  BP: 114/71 122/76  119/69  Pulse: 65 66  66  Resp: 16 16  16  Temp: 99.2 F (37.3 C) 98.8 F (37.1 C) 98.4 F (36.9 C)   TempSrc: Oral Oral    SpO2: 99% 99%  100%  Weight:      Height:       No data found.   Intake/Output Summary (Last 24 hours) at 08/25/2023 1606 Last data filed at 08/25/2023 1421 Gross per 24 hour  Intake 2373.33 ml  Output 1475 ml  Net 898.33 ml   Filed Weights   08/24/23 1113 08/24/23 2049  Weight: 80 kg 82.4 kg    Exam:  GEN: NAD SKIN: Warm and dry EYES: No pallor or icterus ENT: MMM CV: RRR PULM: CTA B ABD: soft, ND, NT, +BS CNS: AAO x 3, non focal EXT: No edema or tenderness      Data Reviewed:   I have personally reviewed following labs and imaging studies:  Labs: Labs show the following:   Basic Metabolic Panel: Recent Labs  Lab  08/24/23 1117 08/25/23 0538  NA 138 142  K 4.4 4.9  CL 108 115*  CO2 16* 17*  GLUCOSE 87 73  BUN 93* 79*  CREATININE 6.60* 5.52*  CALCIUM 9.4 9.1   GFR Estimated Creatinine Clearance: 13.3 mL/min (A) (by C-G formula based on SCr of 5.52 mg/dL (H)). Liver Function Tests: Recent Labs  Lab 08/24/23 1117  AST 36  ALT 27  ALKPHOS 68  BILITOT 0.7  PROT 7.8  ALBUMIN 3.5   No results for input(s): "LIPASE", "AMYLASE" in the last 168 hours. No results for input(s): "AMMONIA" in the last 168 hours. Coagulation profile No results for input(s): "INR", "PROTIME" in the last 168 hours.  CBC: Recent Labs  Lab 08/24/23 1117 08/25/23 0538  WBC 3.4* 2.7*  NEUTROABS  --  1.9  HGB 6.7* 7.7*  HCT 21.2* 23.7*  MCV 88.3 87.1  PLT 195 174   Cardiac Enzymes: No results for input(s): "CKTOTAL", "CKMB", "CKMBINDEX", "TROPONINI" in the last 168 hours. BNP (last 3 results) No results for input(s): "PROBNP" in the last 8760 hours. CBG: No results for input(s): "GLUCAP" in the last 168 hours. D-Dimer: No results for input(s): "DDIMER" in the last 72 hours. Hgb A1c: No results for input(s): "HGBA1C" in the last 72 hours. Lipid Profile: No results for input(s): "CHOL", "HDL", "LDLCALC", "TRIG", "CHOLHDL", "LDLDIRECT" in the last 72 hours. Thyroid function studies: No results for input(s): "TSH", "T4TOTAL", "T3FREE", "THYROIDAB" in the last 72 hours.  Invalid input(s): "FREET3" Anemia work up: Recent Labs    08/24/23 1117 08/24/23 2120  VITAMINB12  --  901  FERRITIN 1,602*  --   TIBC 255  --   IRON 112  --    Sepsis Labs: Recent Labs  Lab 08/24/23 1117 08/25/23 0538  WBC 3.4* 2.7*    Microbiology No results found for this or any previous visit (from the past 240 hours).  Procedures and diagnostic studies:  US Renal Result Date: 08/24/2023 CLINICAL DATA:  Acute on chronic renal failure. EXAM: RENAL / URINARY TRACT ULTRASOUND COMPLETE COMPARISON:  CT scan abdomen and  pelvis from 07/14/2023. FINDINGS: Right Kidney: Renal measurements: 5.8 x 6.6 x 11.9 cm = volume: 236.2 mL. There is increased cortical echogenicity, nonspecific but commonly seen with medical renal disease. There is probable subtle fullness in the renal pelvis however, no frank hydronephrosis. No contour deforming mass or stone large enough to cause acoustic shadowing. There is a simple cyst in the lower pole measuring 1.7 x 2.2 x 3.1 cm. Left Kidney: Surgically absent. Bladder:  Appears normal for degree of bladder distention. Prevoid urinary volume- 444 mL. Postvoid residual urine volume-289 mL. Other: None. IMPRESSION: 1. Surgically absent left kidney. 2. Increased right renal cortical echogenicity, nonspecific but commonly seen with medical renal disease. 3. There is a 3.1 cm simple cyst in the right kidney lower pole. 4. Otherwise unremarkable exam. Electronically Signed   By: Jules Schick M.D.   On: 08/24/2023 16:53               LOS: 0 days   Teran Knittle  [facility]   Pager on [email or web address]. If 7PM-7AM, please contact night-coverage at [email or web address]     08/25/2023, 4:06 PM

## 2023-08-25 NOTE — Plan of Care (Signed)
  Problem: Pain Management: Goal: General experience of comfort will improve Outcome: Progressing   Problem: Skin Integrity: Goal: Risk for impaired skin integrity will decrease Outcome: Progressing   Problem: Safety: Goal: Ability to remain free from injury will improve Outcome: Progressing

## 2023-08-25 NOTE — Plan of Care (Signed)

## 2023-08-26 ENCOUNTER — Other Ambulatory Visit: Payer: Medicare PPO

## 2023-08-26 DIAGNOSIS — N179 Acute kidney failure, unspecified: Secondary | ICD-10-CM | POA: Diagnosis not present

## 2023-08-26 DIAGNOSIS — N1832 Chronic kidney disease, stage 3b: Secondary | ICD-10-CM | POA: Diagnosis not present

## 2023-08-26 LAB — CBC
HCT: 21.7 % — ABNORMAL LOW (ref 39.0–52.0)
Hemoglobin: 7.2 g/dL — ABNORMAL LOW (ref 13.0–17.0)
MCH: 28 pg (ref 26.0–34.0)
MCHC: 33.2 g/dL (ref 30.0–36.0)
MCV: 84.4 fL (ref 80.0–100.0)
Platelets: 160 10*3/uL (ref 150–400)
RBC: 2.57 MIL/uL — ABNORMAL LOW (ref 4.22–5.81)
RDW: 16 % — ABNORMAL HIGH (ref 11.5–15.5)
WBC: 2.8 10*3/uL — ABNORMAL LOW (ref 4.0–10.5)
nRBC: 0 % (ref 0.0–0.2)

## 2023-08-26 LAB — RENAL FUNCTION PANEL
Albumin: 2.9 g/dL — ABNORMAL LOW (ref 3.5–5.0)
Anion gap: 10 (ref 5–15)
BUN: 72 mg/dL — ABNORMAL HIGH (ref 8–23)
CO2: 19 mmol/L — ABNORMAL LOW (ref 22–32)
Calcium: 8.8 mg/dL — ABNORMAL LOW (ref 8.9–10.3)
Chloride: 112 mmol/L — ABNORMAL HIGH (ref 98–111)
Creatinine, Ser: 4.74 mg/dL — ABNORMAL HIGH (ref 0.61–1.24)
GFR, Estimated: 13 mL/min — ABNORMAL LOW (ref >60)
Glucose, Bld: 94 mg/dL (ref 70–99)
Phosphorus: 3.5 mg/dL (ref 2.5–4.6)
Potassium: 4.3 mmol/L (ref 3.5–5.1)
Sodium: 141 mmol/L (ref 135–145)

## 2023-08-26 NOTE — TOC CM/SW Note (Signed)
Transition of Care [facility]) - Inpatient Brief Assessment   Patient Details  Name: Michael Hinton. MRN: [id number] Date of Birth: [birth year]  Transition of Care [facility]) CM/SW Contact:    Margarito Liner, LCSW Phone Number: 08/26/2023, 4:43 PM   Clinical Narrative: CSW reviewed chart. No TOC needs identified so far. CSW will continue to follow progress. Please place [facility] consult if any needs arise.  Transition of Care Asessment: Insurance and Status: Insurance coverage has been reviewed Patient has primary care physician: Yes Home environment has been reviewed: Single family home Prior level of function:: Not documented Prior/Current Home Services: No current home services Social Drivers of Health Review: SDOH reviewed no interventions necessary Readmission risk has been reviewed: Yes Transition of care needs: no transition of care needs at this time

## 2023-08-26 NOTE — Plan of Care (Signed)

## 2023-08-26 NOTE — Progress Notes (Signed)
[facility] notified that patient hospitalized at [facility] and unable to keep appt today for PPDR. Fax received from [facility] with PPDR today at hospital. PPDR 2 mm (negative.) Results sent for scanning. Jerel Shepherd, RN

## 2023-08-26 NOTE — Plan of Care (Signed)
  Problem: Activity: Goal: Risk for activity intolerance will decrease Outcome: Progressing   Problem: Elimination: Goal: Will not experience complications related to bowel motility Outcome: Progressing Goal: Will not experience complications related to urinary retention Outcome: Progressing   Problem: Pain Management: Goal: General experience of comfort will improve Outcome: Progressing   Problem: Safety: Goal: Ability to remain free from injury will improve Outcome: Progressing   Problem: Skin Integrity: Goal: Risk for impaired skin integrity will decrease Outcome: Progressing

## 2023-08-26 NOTE — Progress Notes (Signed)
Progress Note    Michael Hinton.  MVH:[id number] DOB: 11-27-1950  DOA: 08/24/2023 PCP: Marisue Ivan, NP      Brief Narrative:    Medical records reviewed and are as summarized below:  Michael Edouard. is a 73 y.o. male  with medical history significant of CKD stage IIIb, Renal cell carcinoma s/p laparoscopic hand-assisted left radical nephrectomy (December 2023), rheumatoid arthritis, anemia of chronic renal disease, hypertension, atrial fibrillation not on [facility], CVA, HFpEF, prostate cancer (completed treatment November 2024), type 2 diabetes, who presented to the hospital because of abnormal labs (elevated creatinine).  He complained of general malaise, general weakness and poor appetite.  In the ED, his creatinine was 6.6 and BUN was 93 with GFR of 80.       Assessment/Plan:   Principal Problem:   Acute on chronic renal failure (HCC) Active Problems:   Acute on chronic anemia   Rheumatoid arthritis involving multiple sites with positive rheumatoid factor (HCC)   Essential hypertension   Atrial fibrillation (HCC)   Body mass index is 25.34 kg/m.    AKI on CKD stage IIIb, normal anion gap metabolic acidosis: Creatinine is trending down.  Down from 6.6-4.74.  Continue sodium bicarbonate drip per nephrologist.  Monitor BMP. Losartan on hold.   Acute on chronic anemia: Hemoglobin down from 7.7-7.2.  S/p transfusion with 1 unit of PRBCs on 08/24/2023. Ferritin 1,602.  Discontinue iron supplement   Paroxysmal atrial fibrillation: Heart rate is okay.  Patient is not on long-term anticoagulation.   Rheumatoid arthritis: on chronic prednisone   Comorbidities include chronic HFpEF, history of left renal cell carcinoma s/p left nephrectomy, history of prostate cancer, history of stroke, hypertension   Tuberculin skin test placed on 08/22/2022 at health department was 2 mm (negative) on 08/26/2023.  Completed TB form with lot number, expiration date and name of  nurse who administered it Michael Hinton, [place], Michael Hinton).  Form has been printed and Kristi, RN has been advised to fax the form to the health department as requested.     Diet Order             Diet regular Room service appropriate? Yes; Fluid consistency: Thin  Diet effective now                            Consultants: Nephrologist  Procedures: None    Medications:    feeding supplement  237 mL Oral BID BM   folic acid  1 mg Oral Daily   iron polysaccharides  150 mg Oral Daily   mirtazapine  7.5 mg Oral QHS   predniSONE  7.5 mg Oral Q breakfast   Followed by   Melene Muller ON 09/08/2023] predniSONE  5 mg Oral Q breakfast   rosuvastatin  40 mg Oral Daily   tamsulosin  0.4 mg Oral QPC supper   Continuous Infusions:  sodium bicarbonate 150 mEq in dextrose 5 % 1,150 mL infusion 75 mL/hr at 08/26/23 1022     Anti-infectives (From admission, onward)    None              Family Communication/Anticipated D/C date and plan/Code Status   DVT prophylaxis: SCDs Start: 08/24/23 1635     Code Status: Full Code  Family Communication: Plan discussed with his wife over the phone Disposition Plan: Plan to discharge home   Status is: Inpatient Remains inpatient appropriate because: AKI  Subjective:   Interval events noted.  He feels better.  He has good urine output.  No complaints.  Objective:    Vitals:   08/25/23 1509 08/25/23 1944 08/26/23 0403 08/26/23 0907  BP: 119/69 (!) 146/75 (!) 153/80 (!) 142/80  Pulse: 66 (!) 58 (!) 55 (!) 51  Resp: 16 17 17 16   Temp:  98.2 F (36.8 C) 98.3 F (36.8 C) 98.7 F (37.1 C)  TempSrc:  Oral Oral Oral  SpO2: 100% 100% 99% 93%  Weight:      Height:       No data found.   Intake/Output Summary (Last 24 hours) at 08/26/2023 1144 Last data filed at 08/26/2023 1022 Gross per 24 hour  Intake 1762.5 ml  Output 1475 ml  Net 287.5 ml   Filed Weights   08/24/23 1113 08/24/23 2049  Weight: 80 kg  82.4 kg    Exam:  GEN: NAD SKIN: Warm and dry EYES: No pallor or icterus ENT: MMM CV: RRR PULM: CTA B ABD: soft, ND, NT, +BS CNS: AAO x 3, non focal EXT: No edema or tenderness     Data Reviewed:   I have personally reviewed following labs and imaging studies:  Labs: Labs show the following:   Basic Metabolic Panel: Recent Labs  Lab 08/24/23 1117 08/25/23 0538 08/26/23 0517  NA 138 142 141  K 4.4 4.9 4.3  CL 108 115* 112*  CO2 16* 17* 19*  GLUCOSE 87 73 94  BUN 93* 79* 72*  CREATININE 6.60* 5.52* 4.74*  CALCIUM 9.4 9.1 8.8*  PHOS  --   --  3.5   GFR Estimated Creatinine Clearance: 15.4 mL/min (A) (by C-G formula based on SCr of 4.74 mg/dL (H)). Liver Function Tests: Recent Labs  Lab 08/24/23 1117 08/26/23 0517  AST 36  --   ALT 27  --   ALKPHOS 68  --   BILITOT 0.7  --   PROT 7.8  --   ALBUMIN 3.5 2.9*   No results for input(s): "LIPASE", "AMYLASE" in the last 168 hours. No results for input(s): "AMMONIA" in the last 168 hours. Coagulation profile No results for input(s): "INR", "PROTIME" in the last 168 hours.  CBC: Recent Labs  Lab 08/24/23 1117 08/25/23 0538 08/26/23 0517  WBC 3.4* 2.7* 2.8*  NEUTROABS  --  1.9  --   HGB 6.7* 7.7* 7.2*  HCT 21.2* 23.7* 21.7*  MCV 88.3 87.1 84.4  PLT 195 174 160   Cardiac Enzymes: No results for input(s): "CKTOTAL", "CKMB", "CKMBINDEX", "TROPONINI" in the last 168 hours. BNP (last 3 results) No results for input(s): "PROBNP" in the last 8760 hours. CBG: No results for input(s): "GLUCAP" in the last 168 hours. D-Dimer: No results for input(s): "DDIMER" in the last 72 hours. Hgb A1c: No results for input(s): "HGBA1C" in the last 72 hours. Lipid Profile: No results for input(s): "CHOL", "HDL", "LDLCALC", "TRIG", "CHOLHDL", "LDLDIRECT" in the last 72 hours. Thyroid function studies: No results for input(s): "TSH", "T4TOTAL", "T3FREE", "THYROIDAB" in the last 72 hours.  Invalid input(s):  "FREET3" Anemia work up: Recent Labs    08/24/23 1117 08/24/23 2120  VITAMINB12  --  901  FERRITIN 1,602*  --   TIBC 255  --   IRON 112  --    Sepsis Labs: Recent Labs  Lab 08/24/23 1117 08/25/23 0538 08/26/23 0517  WBC 3.4* 2.7* 2.8*    Microbiology No results found for this or any previous visit (from the past 240 hours).  Procedures and diagnostic studies:  US Renal Result Date: 08/24/2023 CLINICAL DATA:  Acute on chronic renal failure. EXAM: RENAL / URINARY TRACT ULTRASOUND COMPLETE COMPARISON:  CT scan abdomen and pelvis from 07/14/2023. FINDINGS: Right Kidney: Renal measurements: 5.8 x 6.6 x 11.9 cm = volume: 236.2 mL. There is increased cortical echogenicity, nonspecific but commonly seen with medical renal disease. There is probable subtle fullness in the renal pelvis however, no frank hydronephrosis. No contour deforming mass or stone large enough to cause acoustic shadowing. There is a simple cyst in the lower pole measuring 1.7 x 2.2 x 3.1 cm. Left Kidney: Surgically absent. Bladder: Appears normal for degree of bladder distention. Prevoid urinary volume- 444 mL. Postvoid residual urine volume-289 mL. Other: None. IMPRESSION: 1. Surgically absent left kidney. 2. Increased right renal cortical echogenicity, nonspecific but commonly seen with medical renal disease. 3. There is a 3.1 cm simple cyst in the right kidney lower pole. 4. Otherwise unremarkable exam. Electronically Signed   By: Jules Schick M.D.   On: 08/24/2023 16:53               LOS: 1 day   Michael Hinton  [facility]   Pager on [email or web address]. If 7PM-7AM, please contact night-coverage at [email or web address]     08/26/2023, 11:44 AM

## 2023-08-26 NOTE — Progress Notes (Signed)
[facility]  ROUNDING NOTE   Subjective:   Michael Hinton. Is a 73 y.o. male with past medical history that includes renal cell carcinoma s/p left radical nephrectomy (Dec 2023), RA, anemia, hypertension, a fib, diabetes, prostate cancer, and chronic kidney disease stage 3b. Patient presents to the ED at the advice of his nephrologist, Dr Cherylann Ratel, due to abnormal labs. Patient has been admitted for Acute on chronic renal failure (HCC) [N17.9, N18.9] Anemia, unspecified type [D64.9] Acute renal failure superimposed on chronic kidney disease, unspecified acute renal failure type, unspecified CKD stage (HCC) [N17.9, N18.9]  Patient is known to our practice and follows Dr Cherylann Ratel outpatient. He was seen in office on 08/19/23 for routine follow up.  Patient seen sitting up in bed Alert and oriented Tolerating meals without nausea or vomiting Room air No lower extremity edema  Creatinine 4.74 Almost 2 L urine output.  IVF sodium bicarb at 75 mL/h  Objective:  Vital signs in last 24 hours:  Temp:  [98.2 F (36.8 C)-98.7 F (37.1 C)] 98.7 F (37.1 C) (01/16 0907) Pulse Rate:  [51-66] 51 (01/16 0907) Resp:  [16-17] 16 (01/16 0907) BP: (119-153)/(69-80) 142/80 (01/16 0907) SpO2:  [93 %-100 %] 93 % (01/16 0907)  Weight change:  Filed Weights   08/24/23 1113 08/24/23 2049  Weight: 80 kg 82.4 kg    Intake/Output: I/O last 3 completed shifts: In: 2842.1 [P.O.:480; I.V.:2042.1; Blood:320] Out: 2675 [Urine:2675]   Intake/Output this shift:  Total I/O In: 733.8 [P.O.:240; I.V.:493.8] Out: 275 [Urine:275]  Physical Exam: General: NAD  Head: Normocephalic, atraumatic. Moist oral mucosal membranes  Eyes: Anicteric  Lungs:  Clear to auscultation, normal effort  Heart: Regular rate and rhythm  Abdomen:  Soft, nontender,   Extremities:  No peripheral edema.  Neurologic: Nonfocal, moving all four extremities  Skin: No lesions       Basic Metabolic Panel: Recent  Labs  Lab 08/24/23 1117 08/25/23 0538 08/26/23 0517  NA 138 142 141  K 4.4 4.9 4.3  CL 108 115* 112*  CO2 16* 17* 19*  GLUCOSE 87 73 94  BUN 93* 79* 72*  CREATININE 6.60* 5.52* 4.74*  CALCIUM 9.4 9.1 8.8*  PHOS  --   --  3.5    Liver Function Tests: Recent Labs  Lab 08/24/23 1117 08/26/23 0517  AST 36  --   ALT 27  --   ALKPHOS 68  --   BILITOT 0.7  --   PROT 7.8  --   ALBUMIN 3.5 2.9*   No results for input(s): "LIPASE", "AMYLASE" in the last 168 hours. No results for input(s): "AMMONIA" in the last 168 hours.  CBC: Recent Labs  Lab 08/24/23 1117 08/25/23 0538 08/26/23 0517  WBC 3.4* 2.7* 2.8*  NEUTROABS  --  1.9  --   HGB 6.7* 7.7* 7.2*  HCT 21.2* 23.7* 21.7*  MCV 88.3 87.1 84.4  PLT 195 174 160    Cardiac Enzymes: No results for input(s): "CKTOTAL", "CKMB", "CKMBINDEX", "TROPONINI" in the last 168 hours.  BNP: Invalid input(s): "POCBNP"  CBG: No results for input(s): "GLUCAP" in the last 168 hours.  Microbiology: Results for orders placed or performed during the hospital encounter of 07/07/22  Urine Culture     Status: None   Collection Time: 07/07/22  9:41 AM   Specimen: Urine, Clean Catch  Result Value Ref Range Status   Specimen Description   Final    URINE, CLEAN CATCH Performed at [facility], [address], [place], [place] [address]    Special Requests   Final    NONE Performed at [facility], [address] [place], [place] [address]    Culture   Final    NO GROWTH Performed at [facility], [address] [place], [place] [address]    Report Status 07/08/2022 FINAL  Final    Coagulation Studies: No results for input(s): "LABPROT", "INR" in the last 72 hours.  Urinalysis: Recent Labs    08/24/23 2025  COLORURINE STRAW*  LABSPEC 1.011  PHURINE 5.0  GLUCOSEU NEGATIVE  HGBUR MODERATE*  BILIRUBINUR NEGATIVE  KETONESUR NEGATIVE  PROTEINUR 30*  NITRITE NEGATIVE  LEUKOCYTESUR NEGATIVE       Imaging: US Renal Result Date: 08/24/2023 CLINICAL DATA:  Acute on chronic renal failure. EXAM: RENAL / URINARY TRACT ULTRASOUND COMPLETE COMPARISON:  CT scan abdomen and pelvis from 07/14/2023. FINDINGS: Right Kidney: Renal measurements: 5.8 x 6.6 x 11.9 cm = volume: 236.2 mL. There is increased cortical echogenicity, nonspecific but commonly seen with medical renal disease. There is probable subtle fullness in the renal pelvis however, no frank hydronephrosis. No contour deforming mass or stone large enough to cause acoustic shadowing. There is a simple cyst in the lower pole measuring 1.7 x 2.2 x 3.1 cm. Left Kidney: Surgically absent. Bladder: Appears normal for degree of bladder distention. Prevoid urinary volume- 444 mL. Postvoid residual urine volume-289 mL. Other: None. IMPRESSION: 1. Surgically absent left kidney. 2. Increased right renal cortical echogenicity, nonspecific but commonly seen with medical renal disease. 3. There is a 3.1 cm simple cyst in the right kidney lower pole. 4. Otherwise unremarkable exam. Electronically Signed   By: Jules Schick M.D.   On: 08/24/2023 16:53     Medications:    sodium bicarbonate 150 mEq in dextrose 5 % 1,150 mL infusion 75 mL/hr at 08/26/23 1022    feeding supplement  237 mL Oral BID BM   folic acid  1 mg Oral Daily   iron polysaccharides  150 mg Oral Daily   mirtazapine  7.5 mg Oral QHS   predniSONE  7.5 mg Oral Q breakfast   Followed by   Melene Muller ON 09/08/2023] predniSONE  5 mg Oral Q breakfast   rosuvastatin  40 mg Oral Daily   tamsulosin  0.4 mg Oral QPC supper   acetaminophen **OR** acetaminophen, ondansetron **OR** ondansetron (ZOFRAN) IV, polyethylene glycol  Assessment/ Plan:  [name]. is a 73 y.o.  male  with past medical history that includes renal cell carcinoma s/p left radical nephrectomy (Dec 2023), RA, anemia, hypertension, a fib, diabetes, prostate cancer, and chronic kidney disease stage 3b. Patient presents  to the ED at the advice of his nephrologist, Dr Cherylann Ratel, due to abnormal labs. Patient has been admitted for Acute on chronic renal failure (HCC) [N17.9, N18.9] Anemia, unspecified type [D64.9] Acute renal failure superimposed on chronic kidney disease, unspecified acute renal failure type, unspecified CKD stage (HCC) [N17.9, N18.9]   Acute Kidney Injury on chronic kidney disease stage IIIb with baseline creatinine 1.88 and GFR of 38 on 04/05/23.  Acute kidney injury secondary to dehydration, poor oral intake Chronic kidney disease is secondary to hypertension and diabetes.  Renal ultrasound negative for obstruction, 3.1 cm cyst on right kidney pole. No IV contrast exposure.   Creatinine continues to slowly improve.  Continue IV fluid for now.  Avoid nephrotoxic agents, therapies, and hypotension.  No acute indication for dialysis.  Lab Results  Component Value Date   CREATININE 4.74 (H) 08/26/2023   CREATININE 5.52 (H) 08/25/2023   CREATININE 6.60 (H) 08/24/2023    Intake/Output Summary (Last 24 hours) at 08/26/2023 1228 Last data filed at 08/26/2023 1022 Gross per 24 hour  Intake 1762.5 ml  Output 1475 ml  Net 287.5 ml   2. Acute metabolic acidosis. S bicarb 19.  Correcting with sodium bicarb infusion.  3. Anemia of chronic kidney disease Lab Results  Component Value Date   HGB 7.2 (L) 08/26/2023    Hgb 7.2 today from 7.7 yesterday. Patient received a 1 unit blood transfusion during this admission.     LOS: 1 Michael Hinton 1/16/202512:28 PM

## 2023-08-27 DIAGNOSIS — N179 Acute kidney failure, unspecified: Secondary | ICD-10-CM | POA: Diagnosis not present

## 2023-08-27 DIAGNOSIS — N1832 Chronic kidney disease, stage 3b: Secondary | ICD-10-CM | POA: Diagnosis not present

## 2023-08-27 LAB — BASIC METABOLIC PANEL
Anion gap: 10 (ref 5–15)
BUN: 69 mg/dL — ABNORMAL HIGH (ref 8–23)
CO2: 23 mmol/L (ref 22–32)
Calcium: 9 mg/dL (ref 8.9–10.3)
Chloride: 110 mmol/L (ref 98–111)
Creatinine, Ser: 4.36 mg/dL — ABNORMAL HIGH (ref 0.61–1.24)
GFR, Estimated: 14 mL/min — ABNORMAL LOW (ref >60)
Glucose, Bld: 83 mg/dL (ref 70–99)
Potassium: 4.3 mmol/L (ref 3.5–5.1)
Sodium: 143 mmol/L (ref 135–145)

## 2023-08-27 LAB — CBC
HCT: 22.9 % — ABNORMAL LOW (ref 39.0–52.0)
Hemoglobin: 7.4 g/dL — ABNORMAL LOW (ref 13.0–17.0)
MCH: 27.9 pg (ref 26.0–34.0)
MCHC: 32.3 g/dL (ref 30.0–36.0)
MCV: 86.4 fL (ref 80.0–100.0)
Platelets: 167 10*3/uL (ref 150–400)
RBC: 2.65 MIL/uL — ABNORMAL LOW (ref 4.22–5.81)
RDW: 16 % — ABNORMAL HIGH (ref 11.5–15.5)
WBC: 3.1 10*3/uL — ABNORMAL LOW (ref 4.0–10.5)
nRBC: 0 % (ref 0.0–0.2)

## 2023-08-27 MED ORDER — ORENCIA CLICKJECT 125 MG/ML ~~LOC~~ SOAJ
125.0000 mg | SUBCUTANEOUS | 0 refills | Status: DC
  Filled 2023-09-01: qty 4, 30d supply, fill #0

## 2023-08-27 MED ORDER — SODIUM BICARBONATE 8.4 % IV SOLN
INTRAVENOUS | Status: DC
  Filled 2023-08-27 (×3): qty 150

## 2023-08-27 NOTE — Plan of Care (Signed)

## 2023-08-27 NOTE — Plan of Care (Signed)

## 2023-08-27 NOTE — Care Management Important Message (Signed)
Important Message  Patient Details  Name: Michael Hinton. MRN: [id number] Date of Birth: 1950-09-28   Important Message Given:  Yes - Medicare IM     Sherilyn Banker 08/27/2023, 11:03 AM

## 2023-08-27 NOTE — Progress Notes (Signed)
[facility]  ROUNDING NOTE   Subjective:   Michael Hinton. Is a 73 y.o. male with past medical history that includes renal cell carcinoma s/p left radical nephrectomy (Dec 2023), RA, anemia, hypertension, a fib, diabetes, prostate cancer, and chronic kidney disease stage 3b. Patient presents to the ED at the advice of his nephrologist, Dr Cherylann Ratel, due to abnormal labs. Patient has been admitted for Acute on chronic renal failure (HCC) [N17.9, N18.9] Anemia, unspecified type [D64.9] Acute renal failure superimposed on chronic kidney disease, unspecified acute renal failure type, unspecified CKD stage (HCC) [N17.9, N18.9]  Patient is known to our practice and follows Dr Cherylann Ratel outpatient. He was seen in office on 08/19/23 for routine follow up.  Patient seen laying in bed Alert and oriented Tolerating meals without nausea  IVF off   Creatinine 4.36 Urine output in past 24 hours  Objective:  Vital signs in last 24 hours:  Temp:  [97.7 F (36.5 C)-98.7 F (37.1 C)] 98.7 F (37.1 C) (01/17 0740) Pulse Rate:  [50-58] 55 (01/17 0740) Resp:  [16-20] 20 (01/17 0740) BP: (122-172)/(72-89) 122/72 (01/17 0740) SpO2:  [96 %-100 %] 100 % (01/17 0740)  Weight change:  Filed Weights   08/24/23 1113 08/24/23 2049  Weight: 80 kg 82.4 kg    Intake/Output: I/O last 3 completed shifts: In: 2298.8 [P.O.:600; I.V.:1698.8] Out: 1925 [Urine:1925]   Intake/Output this shift:  Total I/O In: 240 [P.O.:240] Out: -   Physical Exam: General: NAD  Head: Normocephalic, atraumatic. Moist oral mucosal membranes  Eyes: Anicteric  Lungs:  Clear to auscultation, normal effort  Heart: Regular rate and rhythm  Abdomen:  Soft, nontender  Extremities:  No peripheral edema.  Neurologic: Nonfocal, moving all four extremities  Skin: No lesions       Basic Metabolic Panel: Recent Labs  Lab 08/24/23 1117 08/25/23 0538 08/26/23 0517 08/27/23 0727  NA 138 142 141 143  K 4.4 4.9  4.3 4.3  CL 108 115* 112* 110  CO2 16* 17* 19* 23  GLUCOSE 87 73 94 83  BUN 93* 79* 72* 69*  CREATININE 6.60* 5.52* 4.74* 4.36*  CALCIUM 9.4 9.1 8.8* 9.0  PHOS  --   --  3.5  --     Liver Function Tests: Recent Labs  Lab 08/24/23 1117 08/26/23 0517  AST 36  --   ALT 27  --   ALKPHOS 68  --   BILITOT 0.7  --   PROT 7.8  --   ALBUMIN 3.5 2.9*   No results for input(s): "LIPASE", "AMYLASE" in the last 168 hours. No results for input(s): "AMMONIA" in the last 168 hours.  CBC: Recent Labs  Lab 08/24/23 1117 08/25/23 0538 08/26/23 0517 08/27/23 0727  WBC 3.4* 2.7* 2.8* 3.1*  NEUTROABS  --  1.9  --   --   HGB 6.7* 7.7* 7.2* 7.4*  HCT 21.2* 23.7* 21.7* 22.9*  MCV 88.3 87.1 84.4 86.4  PLT 195 174 160 167    Cardiac Enzymes: No results for input(s): "CKTOTAL", "CKMB", "CKMBINDEX", "TROPONINI" in the last 168 hours.  BNP: Invalid input(s): "POCBNP"  CBG: No results for input(s): "GLUCAP" in the last 168 hours.  Microbiology: Results for orders placed or performed during the hospital encounter of 07/07/22  Urine Culture     Status: None   Collection Time: 07/07/22  9:41 AM   Specimen: Urine, Clean Catch  Result Value Ref Range Status   Specimen Description   Final  URINE, CLEAN CATCH Performed at [facility], [address], [place], [place] [address]    Special Requests   Final    NONE Performed at [facility], [address], [place], [place] [address]    Culture   Final    NO GROWTH Performed at [facility], [address], [place], [place] [address]    Report Status 07/08/2022 FINAL  Final    Coagulation Studies: No results for input(s): "LABPROT", "INR" in the last 72 hours.  Urinalysis: Recent Labs    08/24/23 2025  COLORURINE STRAW*  LABSPEC 1.011  PHURINE 5.0  GLUCOSEU NEGATIVE  HGBUR MODERATE*  BILIRUBINUR NEGATIVE  KETONESUR NEGATIVE  PROTEINUR 30*  NITRITE NEGATIVE  LEUKOCYTESUR NEGATIVE       Imaging: No results found.    Medications:    sodium bicarbonate 150 mEq in dextrose 5 % 1,150 mL infusion 75 mL/hr at 08/27/23 1153    feeding supplement  237 mL Oral BID BM   folic acid  1 mg Oral Daily   iron polysaccharides  150 mg Oral Daily   mirtazapine  7.5 mg Oral QHS   predniSONE  7.5 mg Oral Q breakfast   Followed by   Melene Muller ON 09/08/2023] predniSONE  5 mg Oral Q breakfast   rosuvastatin  40 mg Oral Daily   tamsulosin  0.4 mg Oral QPC supper   acetaminophen **OR** acetaminophen, ondansetron **OR** ondansetron (ZOFRAN) IV, polyethylene glycol  Assessment/ Plan:  [name]. is a 73 y.o.  male  with past medical history that includes renal cell carcinoma s/p left radical nephrectomy (Dec 2023), RA, anemia, hypertension, a fib, diabetes, prostate cancer, and chronic kidney disease stage 3b. Patient presents to the ED at the advice of his nephrologist, Dr Cherylann Ratel, due to abnormal labs. Patient has been admitted for Acute on chronic renal failure (HCC) [N17.9, N18.9] Anemia, unspecified type [D64.9] Acute renal failure superimposed on chronic kidney disease, unspecified acute renal failure type, unspecified CKD stage (HCC) [N17.9, N18.9]   Acute Kidney Injury on chronic kidney disease stage IIIb with baseline creatinine 1.88 and GFR of 38 on 04/05/23.  Acute kidney injury secondary to dehydration, poor oral intake Chronic kidney disease is secondary to hypertension and diabetes.  Renal ultrasound negative for obstruction, 3.1 cm cyst on right kidney pole. No IV contrast exposure.   Creatinine continues to slowly improve. Decent urine output. No acute need for dialysis. Will restart IVF for now. Would prefer creatinine around 3 or less prior to discharge. Will need follow up in our office at discharge.   Lab Results  Component Value Date   CREATININE 4.36 (H) 08/27/2023   CREATININE 4.74 (H) 08/26/2023   CREATININE 5.52 (H) 08/25/2023    Intake/Output  Summary (Last 24 hours) at 08/27/2023 1305 Last data filed at 08/27/2023 1100 Gross per 24 hour  Intake 1108.75 ml  Output 700 ml  Net 408.75 ml   2. Acute metabolic acidosis. S bicarb 23.  Corrected with sodium bicarb infusion.  3. Anemia of chronic kidney disease Lab Results  Component Value Date   HGB 7.4 (L) 08/27/2023    Hgb stable, 7.4. Patient received a 1 unit blood transfusion during this admission.     LOS: 2 Michael Hinton 1/17/20251:05 PM

## 2023-08-27 NOTE — Consult Note (Signed)
[facility] Liaison Note  08/27/2023  Raunel Uehara [birth year] [id number]  Location: RN Hospital Liaison screened the patient remotely at [facility].  Insurance: [facility]   Aletta Edouard. is a 73 y.o. male who is a Primary Care Patient of [place], Hospital doctor, NP with [employer].  The patient was screened for readmission hospitalization with noted medium risk score for unplanned readmission risk with 1 IP in 6 months.  The patient was assessed for potential Care Management service needs for post hospital transition for care coordination. Review of patient's electronic medical record reveals patient was admitted for Acute Chronic renal failure. No anticipated needs presented at this time to address via VBCI.  Plan: [facility] Liaison will continue to follow progress and disposition to asess for post hospital community care coordination/management needs.  Referral request for community care coordination: pending disposition.   VBCI Care Management/Population Health does not replace or interfere with any arrangements made by the Inpatient Transition of Care team.   For questions contact:   Elliot Cousin, RN, [place] Day Liaison [place]   [facility], Population Health Office Hours MTWF  8:00 am-6:00 pm Direct Dial: [phone] mobile [phone] [Office toll free line] Office Hours are M-F 8:30 - 5 pm [email or web address]

## 2023-08-27 NOTE — Telephone Encounter (Signed)
Per chart review, patient's PPD negative. Okay to move forward with [facility] now. He is expected to be discharged this weekend so can be onboarded next week  Last filled 03/10/2023. Like will need to be onboarded by [place] and myself again  Chesley Mires, PharmD, MPH, BCPS, CPP Clinical Pharmacist (Rheumatology and Pulmonology)

## 2023-08-27 NOTE — Progress Notes (Signed)
Progress Note    Michael Hinton.  IHK:[id number] DOB: 01/09/1951  DOA: 08/24/2023 PCP: Marisue Ivan, NP      Brief Narrative:    Medical records reviewed and are as summarized below:  Michael Edouard. is a 73 y.o. male  with medical history significant of CKD stage IIIb, Renal cell carcinoma s/p laparoscopic hand-assisted left radical nephrectomy (December 2023), rheumatoid arthritis, anemia of chronic renal disease, hypertension, atrial fibrillation not on [facility], CVA, HFpEF, prostate cancer (completed treatment November 2024), type 2 diabetes, who presented to the hospital because of abnormal labs (elevated creatinine).  He complained of general malaise, general weakness and poor appetite.  In the ED, his creatinine was 6.6 and BUN was 93 with GFR of 80.       Assessment/Plan:   Principal Problem:   Acute on chronic renal failure (HCC) Active Problems:   Acute on chronic anemia   Rheumatoid arthritis involving multiple sites with positive rheumatoid factor (HCC)   Essential hypertension   Atrial fibrillation (HCC)   Body mass index is 25.34 kg/m.    AKI on CKD stage IIIb, normal anion gap metabolic acidosis: Metabolic acidosis has resolved.  Creatinine is trending down.  Down from 6.6-4.74-4.36.  He has been restarted on sodium bicarbonate infusion.  Losartan on hold.  Monitor BMP and follow-up with nephrologist.   Acute on chronic anemia: H&H is stable.  Hemoglobin 7.4..  S/p transfusion with 1 unit of PRBCs on 08/24/2023. Ferritin 1,602.    Paroxysmal atrial fibrillation: Heart rate is okay.  Patient is not on long-term anticoagulation.   Rheumatoid arthritis: on chronic prednisone   Comorbidities include chronic HFpEF, history of left renal cell carcinoma s/p left nephrectomy, history of prostate cancer, history of stroke, hypertension   Tuberculin skin test placed on 08/22/2022 at health department was 2 mm (negative) on 08/26/2023.  Completed TB  form on 08/26/2023 .     Diet Order             Diet regular Room service appropriate? Yes; Fluid consistency: Thin  Diet effective now                            Consultants: Nephrologist  Procedures: None    Medications:    feeding supplement  237 mL Oral BID BM   folic acid  1 mg Oral Daily   iron polysaccharides  150 mg Oral Daily   mirtazapine  7.5 mg Oral QHS   predniSONE  7.5 mg Oral Q breakfast   Followed by   Melene Muller ON 09/08/2023] predniSONE  5 mg Oral Q breakfast   rosuvastatin  40 mg Oral Daily   tamsulosin  0.4 mg Oral QPC supper   Continuous Infusions:  sodium bicarbonate 150 mEq in dextrose 5 % 1,150 mL infusion 75 mL/hr at 08/27/23 1153     Anti-infectives (From admission, onward)    None              Family Communication/Anticipated D/C date and plan/Code Status   DVT prophylaxis: SCDs Start: 08/24/23 1635     Code Status: Full Code  Family Communication: None Disposition Plan: Plan to discharge home   Status is: Inpatient Remains inpatient appropriate because: AKI         Subjective:   No acute events overnight.  No shortness of breath or chest pain.  He feels okay.  Urine output is adequate.  Objective:  Vitals:   08/26/23 2132 08/27/23 0422 08/27/23 0740 08/27/23 1539  BP: (!) 172/89 (!) 155/78 122/72 119/81  Pulse: (!) 50 (!) 58 (!) 55 67  Resp:   20 (!) 24  Temp: 98.6 F (37 C) 98.6 F (37 C) 98.7 F (37.1 C) 97.9 F (36.6 C)  TempSrc: Oral Oral Oral Oral  SpO2: 100% 96% 100% 100%  Weight:      Height:       No data found.   Intake/Output Summary (Last 24 hours) at 08/27/2023 1615 Last data filed at 08/27/2023 1100 Gross per 24 hour  Intake 748.75 ml  Output 400 ml  Net 348.75 ml   Filed Weights   08/24/23 1113 08/24/23 2049  Weight: 80 kg 82.4 kg    Exam:  GEN: NAD SKIN: Warm and dry EYES: No pallor or icterus ENT: MMM, hearing impairment CV: RRR PULM: CTA B ABD:  soft, ND, NT, +BS CNS: AAO x 3, non focal EXT: No edema or tenderness      Data Reviewed:   I have personally reviewed following labs and imaging studies:  Labs: Labs show the following:   Basic Metabolic Panel: Recent Labs  Lab 08/24/23 1117 08/25/23 0538 08/26/23 0517 08/27/23 0727  NA 138 142 141 143  K 4.4 4.9 4.3 4.3  CL 108 115* 112* 110  CO2 16* 17* 19* 23  GLUCOSE 87 73 94 83  BUN 93* 79* 72* 69*  CREATININE 6.60* 5.52* 4.74* 4.36*  CALCIUM 9.4 9.1 8.8* 9.0  PHOS  --   --  3.5  --    GFR Estimated Creatinine Clearance: 16.8 mL/min (A) (by C-G formula based on SCr of 4.36 mg/dL (H)). Liver Function Tests: Recent Labs  Lab 08/24/23 1117 08/26/23 0517  AST 36  --   ALT 27  --   ALKPHOS 68  --   BILITOT 0.7  --   PROT 7.8  --   ALBUMIN 3.5 2.9*   No results for input(s): "LIPASE", "AMYLASE" in the last 168 hours. No results for input(s): "AMMONIA" in the last 168 hours. Coagulation profile No results for input(s): "INR", "PROTIME" in the last 168 hours.  CBC: Recent Labs  Lab 08/24/23 1117 08/25/23 0538 08/26/23 0517 08/27/23 0727  WBC 3.4* 2.7* 2.8* 3.1*  NEUTROABS  --  1.9  --   --   HGB 6.7* 7.7* 7.2* 7.4*  HCT 21.2* 23.7* 21.7* 22.9*  MCV 88.3 87.1 84.4 86.4  PLT 195 174 160 167   Cardiac Enzymes: No results for input(s): "CKTOTAL", "CKMB", "CKMBINDEX", "TROPONINI" in the last 168 hours. BNP (last 3 results) No results for input(s): "PROBNP" in the last 8760 hours. CBG: No results for input(s): "GLUCAP" in the last 168 hours. D-Dimer: No results for input(s): "DDIMER" in the last 72 hours. Hgb A1c: No results for input(s): "HGBA1C" in the last 72 hours. Lipid Profile: No results for input(s): "CHOL", "HDL", "LDLCALC", "TRIG", "CHOLHDL", "LDLDIRECT" in the last 72 hours. Thyroid function studies: No results for input(s): "TSH", "T4TOTAL", "T3FREE", "THYROIDAB" in the last 72 hours.  Invalid input(s): "FREET3" Anemia work  up: Recent Labs    08/24/23 2120  VITAMINB12 901   Sepsis Labs: Recent Labs  Lab 08/24/23 1117 08/25/23 0538 08/26/23 0517 08/27/23 0727  WBC 3.4* 2.7* 2.8* 3.1*    Microbiology No results found for this or any previous visit (from the past 240 hours).  Procedures and diagnostic studies:  No results found.  LOS: 2 days   Michael Hinton  [facility]   Pager on [email or web address]. If 7PM-7AM, please contact night-coverage at [email or web address]     08/27/2023, 4:15 PM

## 2023-08-27 NOTE — Addendum Note (Signed)
Addended by: Murrell Redden on: 08/27/2023 10:54 AM   Modules accepted: Orders

## 2023-08-28 DIAGNOSIS — N179 Acute kidney failure, unspecified: Secondary | ICD-10-CM | POA: Diagnosis not present

## 2023-08-28 DIAGNOSIS — N1832 Chronic kidney disease, stage 3b: Secondary | ICD-10-CM | POA: Diagnosis not present

## 2023-08-28 LAB — BASIC METABOLIC PANEL
Anion gap: 10 (ref 5–15)
BUN: 61 mg/dL — ABNORMAL HIGH (ref 8–23)
CO2: 25 mmol/L (ref 22–32)
Calcium: 8.3 mg/dL — ABNORMAL LOW (ref 8.9–10.3)
Chloride: 103 mmol/L (ref 98–111)
Creatinine, Ser: 3.75 mg/dL — ABNORMAL HIGH (ref 0.61–1.24)
GFR, Estimated: 17 mL/min — ABNORMAL LOW (ref >60)
Glucose, Bld: 198 mg/dL — ABNORMAL HIGH (ref 70–99)
Potassium: 4 mmol/L (ref 3.5–5.1)
Sodium: 138 mmol/L (ref 135–145)

## 2023-08-28 LAB — CBC
HCT: 22.5 % — ABNORMAL LOW (ref 39.0–52.0)
Hemoglobin: 7.2 g/dL — ABNORMAL LOW (ref 13.0–17.0)
MCH: 27.9 pg (ref 26.0–34.0)
MCHC: 32 g/dL (ref 30.0–36.0)
MCV: 87.2 fL (ref 80.0–100.0)
Platelets: 148 10*3/uL — ABNORMAL LOW (ref 150–400)
RBC: 2.58 MIL/uL — ABNORMAL LOW (ref 4.22–5.81)
RDW: 15.6 % — ABNORMAL HIGH (ref 11.5–15.5)
WBC: 2.6 10*3/uL — ABNORMAL LOW (ref 4.0–10.5)
nRBC: 0 % (ref 0.0–0.2)

## 2023-08-28 LAB — PREPARE RBC (CROSSMATCH)

## 2023-08-28 MED ORDER — SODIUM CHLORIDE 0.9% IV SOLUTION: Freq: Once | INTRAVENOUS | Status: AC

## 2023-08-28 NOTE — Progress Notes (Signed)
Progress Note    Michael Hinton.  LKG:[id number] DOB: November 17, 1950  DOA: 08/24/2023 PCP: Marisue Ivan, NP      Brief Narrative:    Medical records reviewed and are as summarized below:  Michael Edouard. is a 73 y.o. male  with medical history significant of CKD stage IIIb, Renal cell carcinoma s/p laparoscopic hand-assisted left radical nephrectomy (December 2023), rheumatoid arthritis, anemia of chronic renal disease, hypertension, atrial fibrillation not on [facility], CVA, HFpEF, prostate cancer (completed treatment November 2024), type 2 diabetes, who presented to the hospital because of abnormal labs (elevated creatinine).  He complained of general malaise, general weakness and poor appetite.  In the ED, his creatinine was 6.6 and BUN was 93 with GFR of 80.       Assessment/Plan:   Principal Problem:   Acute on chronic renal failure (HCC) Active Problems:   Acute on chronic anemia   Rheumatoid arthritis involving multiple sites with positive rheumatoid factor (HCC)   Essential hypertension   Atrial fibrillation (HCC)   Body mass index is 25.34 kg/m.    AKI on CKD stage IIIb, normal anion gap metabolic acidosis: Metabolic acidosis has resolved.  Creatinine is slowly improving.  Down from 6.6-4.74-4.36-3.75.  Continue IV fluids, monitor BMP.  Losartan on hold.  Follow-up with nephrologist   Acute on chronic anemia: He globin down to 7.2.  Transfuse 1 unit of PRBCs and monitor hemoglobin..   S/p transfusion with 1 unit of PRBCs on 08/24/2023. Ferritin 1,602.    Paroxysmal atrial fibrillation: Heart rate is okay.  Patient is not on long-term anticoagulation.   Rheumatoid arthritis: on chronic prednisone   Comorbidities include chronic HFpEF, history of left renal cell carcinoma s/p left nephrectomy, history of prostate cancer, history of stroke, hypertension   Tuberculin skin test placed on 08/22/2022 at health department was 2 mm (negative) on 08/26/2023.   Completed TB form on 08/26/2023 .     Diet Order             Diet regular Room service appropriate? Yes; Fluid consistency: Thin  Diet effective now                            Consultants: Nephrologist  Procedures: None    Medications:    feeding supplement  237 mL Oral BID BM   folic acid  1 mg Oral Daily   iron polysaccharides  150 mg Oral Daily   mirtazapine  7.5 mg Oral QHS   predniSONE  7.5 mg Oral Q breakfast   Followed by   Melene Muller ON 09/08/2023] predniSONE  5 mg Oral Q breakfast   rosuvastatin  40 mg Oral Daily   tamsulosin  0.4 mg Oral QPC supper   Continuous Infusions:  sodium bicarbonate 150 mEq in dextrose 5 % 1,150 mL infusion 75 mL/hr at 08/28/23 0406     Anti-infectives (From admission, onward)    None              Family Communication/Anticipated D/C date and plan/Code Status   DVT prophylaxis: SCDs Start: 08/24/23 1635     Code Status: Full Code  Family Communication: None Disposition Plan: Plan to discharge home   Status is: Inpatient Remains inpatient appropriate because: AKI         Subjective:   Interval events noted.  He complains of general weakness.  No other complaints.  Urine output is decent.  Objective:  Vitals:   08/27/23 2002 08/28/23 0831 08/28/23 1330 08/28/23 1349  BP: 132/80 123/73 132/73 (!) 147/73  Pulse: 62 (!) 54 (!) 59 (!) 55  Resp:  18 18 18   Temp: 98.3 F (36.8 C) 98.5 F (36.9 C) 98.8 F (37.1 C) 98.3 F (36.8 C)  TempSrc: Oral Oral Oral   SpO2: 100% 91% 100% 100%  Weight:      Height:       No data found.   Intake/Output Summary (Last 24 hours) at 08/28/2023 1405 Last data filed at 08/28/2023 1330 Gross per 24 hour  Intake 400 ml  Output 750 ml  Net -350 ml   Filed Weights   08/24/23 1113 08/24/23 2049  Weight: 80 kg 82.4 kg    Exam:  GEN: NAD SKIN: Warm and dry EYES: No pallor or icterus ENT: MMM CV: RRR PULM: CTA B ABD: soft, ND, NT, +BS CNS:  AAO x 3, non focal EXT: No edema or tenderness    Data Reviewed:   I have personally reviewed following labs and imaging studies:  Labs: Labs show the following:   Basic Metabolic Panel: Recent Labs  Lab 08/24/23 1117 08/25/23 0538 08/26/23 0517 08/27/23 0727 08/28/23 0530  NA 138 142 141 143 138  K 4.4 4.9 4.3 4.3 4.0  CL 108 115* 112* 110 103  CO2 16* 17* 19* 23 25  GLUCOSE 87 73 94 83 198*  BUN 93* 79* 72* 69* 61*  CREATININE 6.60* 5.52* 4.74* 4.36* 3.75*  CALCIUM 9.4 9.1 8.8* 9.0 8.3*  PHOS  --   --  3.5  --   --    GFR Estimated Creatinine Clearance: 19.5 mL/min (A) (by C-G formula based on SCr of 3.75 mg/dL (H)). Liver Function Tests: Recent Labs  Lab 08/24/23 1117 08/26/23 0517  AST 36  --   ALT 27  --   ALKPHOS 68  --   BILITOT 0.7  --   PROT 7.8  --   ALBUMIN 3.5 2.9*   No results for input(s): "LIPASE", "AMYLASE" in the last 168 hours. No results for input(s): "AMMONIA" in the last 168 hours. Coagulation profile No results for input(s): "INR", "PROTIME" in the last 168 hours.  CBC: Recent Labs  Lab 08/24/23 1117 08/25/23 0538 08/26/23 0517 08/27/23 0727 08/28/23 0530  WBC 3.4* 2.7* 2.8* 3.1* 2.6*  NEUTROABS  --  1.9  --   --   --   HGB 6.7* 7.7* 7.2* 7.4* 7.2*  HCT 21.2* 23.7* 21.7* 22.9* 22.5*  MCV 88.3 87.1 84.4 86.4 87.2  PLT 195 174 160 167 148*   Cardiac Enzymes: No results for input(s): "CKTOTAL", "CKMB", "CKMBINDEX", "TROPONINI" in the last 168 hours. BNP (last 3 results) No results for input(s): "PROBNP" in the last 8760 hours. CBG: No results for input(s): "GLUCAP" in the last 168 hours. D-Dimer: No results for input(s): "DDIMER" in the last 72 hours. Hgb A1c: No results for input(s): "HGBA1C" in the last 72 hours. Lipid Profile: No results for input(s): "CHOL", "HDL", "LDLCALC", "TRIG", "CHOLHDL", "LDLDIRECT" in the last 72 hours. Thyroid function studies: No results for input(s): "TSH", "T4TOTAL", "T3FREE", "THYROIDAB"  in the last 72 hours.  Invalid input(s): "FREET3" Anemia work up: No results for input(s): "VITAMINB12", "FOLATE", "FERRITIN", "TIBC", "IRON", "RETICCTPCT" in the last 72 hours.  Sepsis Labs: Recent Labs  Lab 08/25/23 0538 08/26/23 0517 08/27/23 0727 08/28/23 0530  WBC 2.7* 2.8* 3.1* 2.6*    Microbiology No results found for this or any previous  visit (from the past 240 hours).  Procedures and diagnostic studies:  No results found.              LOS: 3 days   Michael Hinton  [facility]   Pager on [email or web address]. If 7PM-7AM, please contact night-coverage at [email or web address]     08/28/2023, 2:05 PM

## 2023-08-28 NOTE — Progress Notes (Signed)
[facility]  ROUNDING NOTE   Subjective:   Michael Hinton. Is a 73 y.o. male with past medical history that includes renal cell carcinoma s/p left radical nephrectomy (Dec 2023), RA, anemia, hypertension, a fib, diabetes, prostate cancer, and chronic kidney disease stage 3b. Patient presents to the ED at the advice of his nephrologist, Dr Cherylann Ratel, due to abnormal labs. Patient has been admitted for Acute on chronic renal failure (HCC) [N17.9, N18.9] Anemia, unspecified type [D64.9] Acute renal failure superimposed on chronic kidney disease, unspecified acute renal failure type, unspecified CKD stage (HCC) [N17.9, N18.9]  Patient is known to our practice and follows Dr Cherylann Ratel outpatient. He was seen in office on 08/19/23 for routine follow up.  No complaints.  IVF: bicarb gtt at 61mL/hr  Objective:  Vital signs in last 24 hours:  Temp:  [97.9 F (36.6 C)-98.5 F (36.9 C)] 98.5 F (36.9 C) (01/18 0831) Pulse Rate:  [54-67] 54 (01/18 0831) Resp:  [18-24] 18 (01/18 0831) BP: (119-132)/(73-81) 123/73 (01/18 0831) SpO2:  [91 %-100 %] 91 % (01/18 0831)  Weight change:  Filed Weights   08/24/23 1113 08/24/23 2049  Weight: 80 kg 82.4 kg    Intake/Output: I/O last 3 completed shifts: In: 390 [P.O.:390] Out: 950 [Urine:950]   Intake/Output this shift:  No intake/output data recorded.  Physical Exam: General: NAD, laying in bed  Head: Normocephalic, atraumatic. Moist oral mucosal membranes  Eyes: Anicteric  Lungs:  Clear to auscultation, normal effort  Heart: Regular rate and rhythm  Abdomen:  Soft, nontender  Extremities:  No peripheral edema.  Neurologic: Nonfocal, moving all four extremities  Skin: No lesions       Basic Metabolic Panel: Recent Labs  Lab 08/24/23 1117 08/25/23 0538 08/26/23 0517 08/27/23 0727 08/28/23 0530  NA 138 142 141 143 138  K 4.4 4.9 4.3 4.3 4.0  CL 108 115* 112* 110 103  CO2 16* 17* 19* 23 25  GLUCOSE 87 73 94 83 198*  BUN  93* 79* 72* 69* 61*  CREATININE 6.60* 5.52* 4.74* 4.36* 3.75*  CALCIUM 9.4 9.1 8.8* 9.0 8.3*  PHOS  --   --  3.5  --   --     Liver Function Tests: Recent Labs  Lab 08/24/23 1117 08/26/23 0517  AST 36  --   ALT 27  --   ALKPHOS 68  --   BILITOT 0.7  --   PROT 7.8  --   ALBUMIN 3.5 2.9*   No results for input(s): "LIPASE", "AMYLASE" in the last 168 hours. No results for input(s): "AMMONIA" in the last 168 hours.  CBC: Recent Labs  Lab 08/24/23 1117 08/25/23 0538 08/26/23 0517 08/27/23 0727 08/28/23 0530  WBC 3.4* 2.7* 2.8* 3.1* 2.6*  NEUTROABS  --  1.9  --   --   --   HGB 6.7* 7.7* 7.2* 7.4* 7.2*  HCT 21.2* 23.7* 21.7* 22.9* 22.5*  MCV 88.3 87.1 84.4 86.4 87.2  PLT 195 174 160 167 148*    Cardiac Enzymes: No results for input(s): "CKTOTAL", "CKMB", "CKMBINDEX", "TROPONINI" in the last 168 hours.  BNP: Invalid input(s): "POCBNP"  CBG: No results for input(s): "GLUCAP" in the last 168 hours.  Microbiology: Results for orders placed or performed during the hospital encounter of 07/07/22  Urine Culture     Status: None   Collection Time: 07/07/22  9:41 AM   Specimen: Urine, Clean Catch  Result Value Ref Range Status   Specimen Description  Final    URINE, CLEAN CATCH Performed at [facility], [address] [place], [place] [address]    Special Requests   Final    NONE Performed at [facility], [address] [place], [place] [address]    Culture   Final    NO GROWTH Performed at [facility], [address], [place], [place] [address]    Report Status 07/08/2022 FINAL  Final    Coagulation Studies: No results for input(s): "LABPROT", "INR" in the last 72 hours.  Urinalysis: No results for input(s): "COLORURINE", "LABSPEC", "PHURINE", "GLUCOSEU", "HGBUR", "BILIRUBINUR", "KETONESUR", "PROTEINUR", "UROBILINOGEN", "NITRITE", "LEUKOCYTESUR" in the last 72 hours.  Invalid input(s): "APPERANCEUR"     Imaging: No results  found.    Medications:    sodium bicarbonate 150 mEq in dextrose 5 % 1,150 mL infusion 75 mL/hr at 08/28/23 0406    sodium chloride   Intravenous Once   feeding supplement  237 mL Oral BID BM   folic acid  1 mg Oral Daily   iron polysaccharides  150 mg Oral Daily   mirtazapine  7.5 mg Oral QHS   predniSONE  7.5 mg Oral Q breakfast   Followed by   Melene Muller ON 09/08/2023] predniSONE  5 mg Oral Q breakfast   rosuvastatin  40 mg Oral Daily   tamsulosin  0.4 mg Oral QPC supper   acetaminophen **OR** acetaminophen, ondansetron **OR** ondansetron (ZOFRAN) IV, polyethylene glycol  Assessment/ Plan:  [name]. is a 73 y.o.  male  with past medical history that includes renal cell carcinoma s/p left radical nephrectomy (Dec 2023), RA, anemia, hypertension, a fib, diabetes, prostate cancer, and chronic kidney disease stage 3b. Patient presents to the ED at the advice of his nephrologist, Dr Cherylann Ratel, due to abnormal labs. Patient has been admitted for Acute on chronic renal failure (HCC) [N17.9, N18.9] Anemia, unspecified type [D64.9] Acute renal failure superimposed on chronic kidney disease, unspecified acute renal failure type, unspecified CKD stage (HCC) [N17.9, N18.9]   Acute Kidney Injury on chronic kidney disease stage IIIb with baseline creatinine 1.88 and GFR of 38 on 04/05/23.  Acute kidney injury secondary to dehydration, poor oral intake Chronic kidney disease is secondary to hypertension and diabetes.  Renal ultrasound negative for obstruction, 3.1 cm cyst on right kidney pole. No IV contrast exposure.   Nonoliguric urine output  Creatinine continues to slowly improve. Decent urine output. No acute need for dialysis.   Continue IV fluids.   No indication for dialysis.   Lab Results  Component Value Date   CREATININE 3.75 (H) 08/28/2023   CREATININE 4.36 (H) 08/27/2023   CREATININE 4.74 (H) 08/26/2023    Intake/Output Summary (Last 24 hours) at 08/28/2023 1144 Last  data filed at 08/27/2023 2100 Gross per 24 hour  Intake 150 ml  Output 750 ml  Net -600 ml   2. Acute metabolic acidosis.  Continue sodium bicarb gtt  3. Anemia of chronic kidney disease Lab Results  Component Value Date   HGB 7.2 (L) 08/28/2023    Hgb stable. Patient received a 1 unit blood transfusion during this admission.     LOS: 3 Michael Hinton 1/18/202511:44 AM

## 2023-08-28 NOTE — Plan of Care (Signed)

## 2023-08-29 DIAGNOSIS — N179 Acute kidney failure, unspecified: Secondary | ICD-10-CM | POA: Diagnosis not present

## 2023-08-29 DIAGNOSIS — N1832 Chronic kidney disease, stage 3b: Secondary | ICD-10-CM | POA: Diagnosis not present

## 2023-08-29 LAB — TYPE AND SCREEN
ABO/RH(D): B POS
Antibody Screen: NEGATIVE
Unit division: 0

## 2023-08-29 LAB — CBC
HCT: 24.8 % — ABNORMAL LOW (ref 39.0–52.0)
Hemoglobin: 8.2 g/dL — ABNORMAL LOW (ref 13.0–17.0)
MCH: 27.9 pg (ref 26.0–34.0)
MCHC: 33.1 g/dL (ref 30.0–36.0)
MCV: 84.4 fL (ref 80.0–100.0)
Platelets: 147 10*3/uL — ABNORMAL LOW (ref 150–400)
RBC: 2.94 MIL/uL — ABNORMAL LOW (ref 4.22–5.81)
RDW: 16.1 % — ABNORMAL HIGH (ref 11.5–15.5)
WBC: 3.5 10*3/uL — ABNORMAL LOW (ref 4.0–10.5)
nRBC: 0 % (ref 0.0–0.2)

## 2023-08-29 LAB — BASIC METABOLIC PANEL
Anion gap: 9 (ref 5–15)
BUN: 55 mg/dL — ABNORMAL HIGH (ref 8–23)
CO2: 29 mmol/L (ref 22–32)
Calcium: 8.4 mg/dL — ABNORMAL LOW (ref 8.9–10.3)
Chloride: 100 mmol/L (ref 98–111)
Creatinine, Ser: 3.47 mg/dL — ABNORMAL HIGH (ref 0.61–1.24)
GFR, Estimated: 18 mL/min — ABNORMAL LOW (ref >60)
Glucose, Bld: 152 mg/dL — ABNORMAL HIGH (ref 70–99)
Potassium: 3.9 mmol/L (ref 3.5–5.1)
Sodium: 138 mmol/L (ref 135–145)

## 2023-08-29 LAB — BPAM RBC
Blood Product Expiration Date: 202502102359
ISSUE DATE / TIME: 202501181321
Unit Type and Rh: 5100

## 2023-08-29 NOTE — Discharge Summary (Signed)
Physician Discharge Summary   Patient: Michael Hinton. MRN: [id number] DOB: [birth year]  Admit date:     08/24/2023  Discharge date: 08/29/23  Discharge Physician: Lurene Shadow   PCP: Marisue Ivan, NP   Recommendations at discharge:   Follow-up with PCP in 1 week Follow-up with Dr. Cherylann Ratel, nephrologist, in 1 week  Discharge Diagnoses: Principal Problem:   Acute on chronic renal failure (HCC) Active Problems:   Acute on chronic anemia   Rheumatoid arthritis involving multiple sites with positive rheumatoid factor (HCC)   Essential hypertension   Atrial fibrillation (HCC)  Resolved Problems:   * No resolved hospital problems. *  Hospital Course:  Michael Suniga. is a 73 y.o. male  with medical history significant of CKD stage IIIb, Renal cell carcinoma s/p laparoscopic hand-assisted left radical nephrectomy (December 2023), rheumatoid arthritis, anemia of chronic renal disease, hypertension, atrial fibrillation not on [facility], CVA, HFpEF, prostate cancer (completed treatment November 2024), type 2 diabetes, who presented to the hospital because of abnormal labs (elevated creatinine).  He complained of general malaise, general weakness and poor appetite.   In the ED, his creatinine was 6.6 and BUN was 93 with GFR of 80.     Assessment and Plan:  AKI on CKD stage IIIb, normal anion gap metabolic acidosis: Metabolic acidosis has resolved.  Creatinine is slowly improving.  Down from 6.6-4.74-4.36-3.75-3.47.  Creatinine was 2.65 on 08/06/2023 Case discussed with Dr. Wynelle Link, nephrologist.  Patient is okay for discharge from Dr. Alice Rieger standpoint.    Acute on chronic anemia: Hemoglobin up to 8.2.  S/p transfusion with 2 units of PRBCs.  Hemoglobin was 6.7 on admission. Ferritin 1,602.      Paroxysmal atrial fibrillation: Heart rate is okay.  Patient is not on long-term anticoagulation.     Rheumatoid arthritis: on chronic prednisone     Comorbidities include chronic  HFpEF, history of left renal cell carcinoma s/p left nephrectomy, history of prostate cancer, history of stroke, hypertension     Tuberculin skin test placed on 08/22/2022 at health department was 2 mm (negative) on 08/26/2023.  Completed TB form on 08/26/2023 .       Consultants: Nephrologist Procedures performed: None Disposition: Home Diet recommendation:  Discharge Diet Orders (From admission, onward)     Start     Ordered   08/29/23 0000  Diet - low sodium heart healthy        08/29/23 1021           Cardiac diet DISCHARGE MEDICATION: Allergies as of 08/29/2023   No Known Allergies      Medication List     TAKE these medications    folic acid 1 MG tablet Commonly known as: FOLVITE Take 1 tablet (1 mg total) by mouth daily.   IRON PO Take 1 tablet by mouth daily.   losartan 50 MG tablet Commonly known as: COZAAR Take 1 tablet (50 mg total) by mouth daily.   mirtazapine 7.5 MG tablet Commonly known as: REMERON Take 1 tablet (7.5 mg total) by mouth at bedtime.   Orencia ClickJect 125 MG/ML Soaj Generic drug: Abatacept Inject 125 mg into the skin every 14 (fourteen) days.   rosuvastatin 40 MG tablet Commonly known as: CRESTOR Take 1 tablet (40 mg total) by mouth daily.   tamsulosin 0.4 MG Caps capsule Commonly known as: FLOMAX Take 1 capsule (0.4 mg total) by mouth daily after supper.   triamcinolone ointment 0.5 % Commonly known as: KENALOG Apply 1 Application  topically 2 (two) times daily.   Vitamin D-3 125 MCG (5000 UT) Tabs Take 1 tablet by mouth daily.        Follow-up Information     Cherylann Ratel, [place], MD. Schedule an appointment as soon as possible for a visit in 1 week(s).   Specialty: Nephrology Contact information: [address] [place] [address] [phone]                Discharge Exam: Filed Weights   08/24/23 1113 08/24/23 2049  Weight: 80 kg 82.4 kg   GEN: NAD SKIN: Warm and dry EYES: No pallor or  icterus ENT: MMM CV: RRR PULM: CTA B ABD: soft, ND, NT, +BS CNS: AAO x 3, non focal EXT: No edema or tenderness   Condition at discharge: good  The results of significant diagnostics from this hospitalization (including imaging, microbiology, ancillary and laboratory) are listed below for reference.   Imaging Studies: US Renal Result Date: 08/24/2023 CLINICAL DATA:  Acute on chronic renal failure. EXAM: RENAL / URINARY TRACT ULTRASOUND COMPLETE COMPARISON:  CT scan abdomen and pelvis from 07/14/2023. FINDINGS: Right Kidney: Renal measurements: 5.8 x 6.6 x 11.9 cm = volume: 236.2 mL. There is increased cortical echogenicity, nonspecific but commonly seen with medical renal disease. There is probable subtle fullness in the renal pelvis however, no frank hydronephrosis. No contour deforming mass or stone large enough to cause acoustic shadowing. There is a simple cyst in the lower pole measuring 1.7 x 2.2 x 3.1 cm. Left Kidney: Surgically absent. Bladder: Appears normal for degree of bladder distention. Prevoid urinary volume- 444 mL. Postvoid residual urine volume-289 mL. Other: None. IMPRESSION: 1. Surgically absent left kidney. 2. Increased right renal cortical echogenicity, nonspecific but commonly seen with medical renal disease. 3. There is a 3.1 cm simple cyst in the right kidney lower pole. 4. Otherwise unremarkable exam. Electronically Signed   By: Jules Schick M.D.   On: 08/24/2023 16:53    Microbiology: Results for orders placed or performed during the hospital encounter of 07/07/22  Urine Culture     Status: None   Collection Time: 07/07/22  9:41 AM   Specimen: Urine, Clean Catch  Result Value Ref Range Status   Specimen Description   Final    URINE, CLEAN CATCH Performed at [facility], [address] [place], [place] [address]    Special Requests   Final    NONE Performed at [facility], [address] [place], [place] [address]    Culture   Final     NO GROWTH Performed at [facility], [address] [place], [place] [address]    Report Status 07/08/2022 FINAL  Final    Labs: CBC: Recent Labs  Lab 08/25/23 0538 08/26/23 0517 08/27/23 0727 08/28/23 0530 08/29/23 0427  WBC 2.7* 2.8* 3.1* 2.6* 3.5*  NEUTROABS 1.9  --   --   --   --   HGB 7.7* 7.2* 7.4* 7.2* 8.2*  HCT 23.7* 21.7* 22.9* 22.5* 24.8*  MCV 87.1 84.4 86.4 87.2 84.4  PLT 174 160 167 148* 147*   Basic Metabolic Panel: Recent Labs  Lab 08/25/23 0538 08/26/23 0517 08/27/23 0727 08/28/23 0530 08/29/23 0427  NA 142 141 143 138 138  K 4.9 4.3 4.3 4.0 3.9  CL 115* 112* 110 103 100  CO2 17* 19* 23 25 29   GLUCOSE 73 94 83 198* 152*  BUN 79* 72* 69* 61* 55*  CREATININE 5.52* 4.74*  4.36* 3.75* 3.47*  CALCIUM 9.1 8.8* 9.0 8.3* 8.4*  PHOS  --  3.5  --   --   --    Liver Function Tests: Recent Labs  Lab 08/24/23 1117 08/26/23 0517  AST 36  --   ALT 27  --   ALKPHOS 68  --   BILITOT 0.7  --   PROT 7.8  --   ALBUMIN 3.5 2.9*   CBG: No results for input(s): "GLUCAP" in the last 168 hours.  Discharge time spent: greater than 30 minutes.  Signed: Lurene Shadow, MD [facility] 08/29/2023

## 2023-08-29 NOTE — Progress Notes (Signed)
[facility]  ROUNDING NOTE   Subjective:   Michael Hinton. Is a 73 y.o. male with past medical history that includes renal cell carcinoma s/p left radical nephrectomy (Dec 2023), RA, anemia, hypertension, a fib, diabetes, prostate cancer, and chronic kidney disease stage 3b. Patient presents to the ED at the advice of his nephrologist, Dr Cherylann Ratel, due to abnormal labs. Patient has been admitted for Acute on chronic renal failure (HCC) [N17.9, N18.9] Anemia, unspecified type [D64.9] Acute renal failure superimposed on chronic kidney disease, unspecified acute renal failure type, unspecified CKD stage (HCC) [N17.9, N18.9]  Patient is known to our practice and follows Dr Cherylann Ratel outpatient. He was seen in office on 08/19/23 for routine follow up.  Patient moved to a semiprivate room.  UOP  Bicarb gtt  Status post PRBC transfusion.   Patient is asking to be discharged today.   Objective:  Vital signs in last 24 hours:  Temp:  [98.2 F (36.8 C)-99 F (37.2 C)] 98.2 F (36.8 C) (01/19 1030) Pulse Rate:  [55-66] 66 (01/19 1030) Resp:  [16-18] 16 (01/19 1030) BP: (109-154)/(66-74) 109/66 (01/19 1030) SpO2:  [98 %-100 %] 98 % (01/19 1030)  Weight change:  Filed Weights   08/24/23 1113 08/24/23 2049  Weight: 80 kg 82.4 kg    Intake/Output: I/O last 3 completed shifts: In: 923 [P.O.:150; I.V.:250; Blood:523] Out: 675 [Urine:675]   Intake/Output this shift:  No intake/output data recorded.  Physical Exam: General: NAD, laying in bed  Head: Normocephalic, atraumatic. Moist oral mucosal membranes  Eyes: Anicteric  Lungs:  Clear to auscultation, normal effort  Heart: Regular rate and rhythm  Abdomen:  Soft, nontender  Extremities:  No peripheral edema.  Neurologic: Nonfocal, moving all four extremities  Skin: No lesions       Basic Metabolic Panel: Recent Labs  Lab 08/25/23 0538 08/26/23 0517 08/27/23 0727 08/28/23 0530 08/29/23 0427  NA 142 141  143 138 138  K 4.9 4.3 4.3 4.0 3.9  CL 115* 112* 110 103 100  CO2 17* 19* 23 25 29   GLUCOSE 73 94 83 198* 152*  BUN 79* 72* 69* 61* 55*  CREATININE 5.52* 4.74* 4.36* 3.75* 3.47*  CALCIUM 9.1 8.8* 9.0 8.3* 8.4*  PHOS  --  3.5  --   --   --     Liver Function Tests: Recent Labs  Lab 08/24/23 1117 08/26/23 0517  AST 36  --   ALT 27  --   ALKPHOS 68  --   BILITOT 0.7  --   PROT 7.8  --   ALBUMIN 3.5 2.9*   No results for input(s): "LIPASE", "AMYLASE" in the last 168 hours. No results for input(s): "AMMONIA" in the last 168 hours.  CBC: Recent Labs  Lab 08/25/23 0538 08/26/23 0517 08/27/23 0727 08/28/23 0530 08/29/23 0427  WBC 2.7* 2.8* 3.1* 2.6* 3.5*  NEUTROABS 1.9  --   --   --   --   HGB 7.7* 7.2* 7.4* 7.2* 8.2*  HCT 23.7* 21.7* 22.9* 22.5* 24.8*  MCV 87.1 84.4 86.4 87.2 84.4  PLT 174 160 167 148* 147*    Cardiac Enzymes: No results for input(s): "CKTOTAL", "CKMB", "CKMBINDEX", "TROPONINI" in the last 168 hours.  BNP: Invalid input(s): "POCBNP"  CBG: No results for input(s): "GLUCAP" in the last 168 hours.  Microbiology: Results for orders placed or performed during the hospital encounter of 07/07/22  Urine Culture     Status: None   Collection Time: 07/07/22  9:41 AM   Specimen: Urine, Clean Catch  Result Value Ref Range Status   Specimen Description   Final    URINE, CLEAN CATCH Performed at [facility], [address], [place], [place] [address]    Special Requests   Final    NONE Performed at [facility], [address] [place], [place] [address]    Culture   Final    NO GROWTH Performed at [facility], [address] [place], [place] [address]    Report Status 07/08/2022 FINAL  Final    Coagulation Studies: No results for input(s): "LABPROT", "INR" in the last 72 hours.  Urinalysis: No results for input(s): "COLORURINE", "LABSPEC", "PHURINE", "GLUCOSEU", "HGBUR", "BILIRUBINUR", "KETONESUR", "PROTEINUR",  "UROBILINOGEN", "NITRITE", "LEUKOCYTESUR" in the last 72 hours.  Invalid input(s): "APPERANCEUR"     Imaging: No results found.    Medications:      feeding supplement  237 mL Oral BID BM   folic acid  1 mg Oral Daily   mirtazapine  7.5 mg Oral QHS   predniSONE  7.5 mg Oral Q breakfast   Followed by   Melene Muller ON 09/08/2023] predniSONE  5 mg Oral Q breakfast   rosuvastatin  40 mg Oral Daily   tamsulosin  0.4 mg Oral QPC supper   acetaminophen **OR** acetaminophen, ondansetron **OR** ondansetron (ZOFRAN) IV, polyethylene glycol  Assessment/ Plan:  [name]. is a 73 y.o.  male  with past medical history that includes renal cell carcinoma s/p left radical nephrectomy (Dec 2023), RA, anemia, hypertension, a fib, diabetes, prostate cancer, and chronic kidney disease stage 3b. Patient presents to the ED at the advice of his nephrologist, Dr Cherylann Ratel, due to abnormal labs. Patient has been admitted for Acute on chronic renal failure (HCC) [N17.9, N18.9] Anemia, unspecified type [D64.9] Acute renal failure superimposed on chronic kidney disease, unspecified acute renal failure type, unspecified CKD stage (HCC) [N17.9, N18.9]   Acute Kidney Injury on chronic kidney disease stage IIIb with baseline creatinine 1.88 and GFR of 38 on 04/05/23.  Acute kidney injury secondary to dehydration, poor oral intake Chronic kidney disease is secondary to hypertension and diabetes.  Renal ultrasound negative for obstruction, 3.1 cm cyst on right kidney pole. No IV contrast exposure.   Nonoliguric urine output  Creatinine continues to slowly improve. Decent urine output. No acute need for dialysis.   Discontinue IV fluids.   No indication for dialysis.   Lab Results  Component Value Date   CREATININE 3.47 (H) 08/29/2023   CREATININE 3.75 (H) 08/28/2023   CREATININE 4.36 (H) 08/27/2023    Intake/Output Summary (Last 24 hours) at 08/29/2023 1126 Last data filed at 08/29/2023 0446 Gross  per 24 hour  Intake 773 ml  Output 425 ml  Net 348 ml   2. Acute metabolic acidosis.  Discontinue sodium bicarb gtt  3. Anemia of chronic kidney disease Lab Results  Component Value Date   HGB 8.2 (L) 08/29/2023    Hgb stable. Patient received PRBC transfusion this admission.   If patient is to be discharged, he will need hospital follow up with Nephrology.     LOS: 4 Rondia Higginbotham 1/19/202511:26 AM

## 2023-08-30 ENCOUNTER — Other Ambulatory Visit: Payer: Self-pay | Admitting: Pharmacist

## 2023-08-30 NOTE — Progress Notes (Signed)
Patient restarting Orencia 125mg  SQ every 14 days. Received clearance from radiation oncology for patient to resume immunosuppressive treatment for RA  Will continue every 14 days injection frequency due to history of recurrent neutropenia.  Chesley Mires, PharmD, MPH, BCPS, CPP Clinical Pharmacist (Rheumatology and Pulmonology)

## 2023-08-31 ENCOUNTER — Other Ambulatory Visit (HOSPITAL_COMMUNITY): Payer: Self-pay

## 2023-09-01 ENCOUNTER — Other Ambulatory Visit: Payer: Self-pay

## 2023-09-01 ENCOUNTER — Other Ambulatory Visit (HOSPITAL_COMMUNITY): Payer: Self-pay

## 2023-09-01 NOTE — Progress Notes (Signed)
Specialty Pharmacy Initial Fill Coordination Note  Michael Hinton. is a 73 y.o. male contacted today regarding initial fill of specialty medication(s) Abatacept (Orencia ClickJect)   Patient requested Delivery   Delivery date: 09/03/23   Verified address: [address]   [place] [address]   Medication will be filled on 09/02/23.   Patient is aware of $12.15 copayment. CC info collected and forwarded to Tamra at [facility].

## 2023-09-02 ENCOUNTER — Other Ambulatory Visit: Payer: Self-pay

## 2023-09-03 ENCOUNTER — Telehealth: Payer: Self-pay | Admitting: *Deleted

## 2023-09-03 NOTE — Telephone Encounter (Signed)
Patient needs clearance by hematology prior to being eligible to restart orencia

## 2023-09-03 NOTE — Telephone Encounter (Signed)
Received documentation from [facility] for TB Skin Test results. Results were negative on 08/26/2023.  Michael Hinton reviewed patient's chart and states we are apprehensive to restart biologic due to current Pancytopenia. Ned to clarify if patient will be following up with hematology.   Attempted to contact Michael Hinton, patient's wife and left message requesting she call to office.

## 2023-09-03 NOTE — Telephone Encounter (Signed)
Contacted Michael Hinton and advised Patient needs clearance by hematology prior to being eligible to restart orenci. Michael Hinton verbalized understanding. Michael Hinton states once the patient sees hematology they will have something faxed over stating whether the patient is cleared or not.

## 2023-09-03 NOTE — Telephone Encounter (Signed)
Patient's wife, Michael Hinton, advised Received documentation from [facility] for TB Skin Test results. Results were negative on 08/26/2023.   Ladona Ridgel reviewed patient's chart and states we are apprehensive to restart biologic due to current Pancytopenia. Ned to clarify if patient will be following up with hematology.  Michael Hinton states the patient was in the hospital last week and had gotten two infusions. Felicia states the patient was advised to follow up with hematology. Felicia states he kidney doctor put in a referral to hematology and that they need to schedule an appointment. Felicia inquires if the patient should start Orencia. Please advise.

## 2023-09-07 DIAGNOSIS — I10 Essential (primary) hypertension: Secondary | ICD-10-CM | POA: Diagnosis not present

## 2023-09-07 DIAGNOSIS — D631 Anemia in chronic kidney disease: Secondary | ICD-10-CM | POA: Diagnosis not present

## 2023-09-07 DIAGNOSIS — N179 Acute kidney failure, unspecified: Secondary | ICD-10-CM | POA: Diagnosis not present

## 2023-09-07 DIAGNOSIS — N1832 Chronic kidney disease, stage 3b: Secondary | ICD-10-CM | POA: Diagnosis not present

## 2023-09-09 ENCOUNTER — Telehealth: Payer: Self-pay | Admitting: Oncology

## 2023-09-09 NOTE — Telephone Encounter (Signed)
Patient's spouse called and left VM in regards to Dr. Cherylann Ratel wanting to obtain clearance from Dr. Cathie Hoops for this patient to restart orencia. Please advise.

## 2023-09-09 NOTE — Telephone Encounter (Signed)
Dr. Garnett Farm note available in care everywhere

## 2023-09-10 NOTE — Telephone Encounter (Signed)
Looks like pt cancelled appt in sept 2023, do you want appt to re-establish care?

## 2023-09-13 NOTE — Telephone Encounter (Signed)
Please set up appt to re-establish care. Ok to schedule this week or next (MD only)

## 2023-09-17 ENCOUNTER — Other Ambulatory Visit (HOSPITAL_COMMUNITY): Payer: Self-pay

## 2023-09-17 ENCOUNTER — Other Ambulatory Visit: Payer: Self-pay

## 2023-09-21 ENCOUNTER — Inpatient Hospital Stay: Payer: Medicare PPO

## 2023-09-21 ENCOUNTER — Encounter: Payer: Self-pay | Admitting: Oncology

## 2023-09-21 ENCOUNTER — Inpatient Hospital Stay: Payer: Medicare PPO | Attending: Oncology | Admitting: Oncology

## 2023-09-21 VITALS — BP 137/74 | HR 70 | Temp 96.0°F | Resp 18 | Wt 193.1 lb

## 2023-09-21 DIAGNOSIS — C642 Malignant neoplasm of left kidney, except renal pelvis: Secondary | ICD-10-CM | POA: Diagnosis not present

## 2023-09-21 DIAGNOSIS — N1832 Chronic kidney disease, stage 3b: Secondary | ICD-10-CM | POA: Diagnosis not present

## 2023-09-21 DIAGNOSIS — R7989 Other specified abnormal findings of blood chemistry: Secondary | ICD-10-CM | POA: Insufficient documentation

## 2023-09-21 DIAGNOSIS — D649 Anemia, unspecified: Secondary | ICD-10-CM

## 2023-09-21 DIAGNOSIS — D472 Monoclonal gammopathy: Secondary | ICD-10-CM

## 2023-09-21 DIAGNOSIS — Z8546 Personal history of malignant neoplasm of prostate: Secondary | ICD-10-CM | POA: Insufficient documentation

## 2023-09-21 DIAGNOSIS — Z85528 Personal history of other malignant neoplasm of kidney: Secondary | ICD-10-CM | POA: Diagnosis not present

## 2023-09-21 DIAGNOSIS — Z905 Acquired absence of kidney: Secondary | ICD-10-CM | POA: Diagnosis not present

## 2023-09-21 DIAGNOSIS — D72819 Decreased white blood cell count, unspecified: Secondary | ICD-10-CM | POA: Diagnosis not present

## 2023-09-21 DIAGNOSIS — M0579 Rheumatoid arthritis with rheumatoid factor of multiple sites without organ or systems involvement: Secondary | ICD-10-CM | POA: Diagnosis not present

## 2023-09-21 DIAGNOSIS — M069 Rheumatoid arthritis, unspecified: Secondary | ICD-10-CM | POA: Diagnosis not present

## 2023-09-21 DIAGNOSIS — N189 Chronic kidney disease, unspecified: Secondary | ICD-10-CM | POA: Diagnosis not present

## 2023-09-21 DIAGNOSIS — D631 Anemia in chronic kidney disease: Secondary | ICD-10-CM | POA: Insufficient documentation

## 2023-09-21 DIAGNOSIS — C61 Malignant neoplasm of prostate: Secondary | ICD-10-CM | POA: Insufficient documentation

## 2023-09-21 DIAGNOSIS — Z923 Personal history of irradiation: Secondary | ICD-10-CM | POA: Diagnosis not present

## 2023-09-21 LAB — HEPATIC FUNCTION PANEL
ALT: 32 U/L (ref 0–44)
AST: 32 U/L (ref 15–41)
Albumin: 3.2 g/dL — ABNORMAL LOW (ref 3.5–5.0)
Alkaline Phosphatase: 71 U/L (ref 38–126)
Bilirubin, Direct: 0.1 mg/dL (ref 0.0–0.2)
Indirect Bilirubin: 0.4 mg/dL (ref 0.3–0.9)
Total Bilirubin: 0.5 mg/dL (ref 0.0–1.2)
Total Protein: 7.1 g/dL (ref 6.5–8.1)

## 2023-09-21 LAB — LACTATE DEHYDROGENASE: LDH: 161 U/L (ref 98–192)

## 2023-09-21 LAB — TECHNOLOGIST SMEAR REVIEW
Plt Morphology: NORMAL
RBC MORPHOLOGY: NORMAL
WBC MORPHOLOGY: NORMAL

## 2023-09-21 LAB — CBC WITH DIFFERENTIAL/PLATELET
Abs Immature Granulocytes: 0.25 10*3/uL — ABNORMAL HIGH (ref 0.00–0.07)
Basophils Absolute: 0 10*3/uL (ref 0.0–0.1)
Basophils Relative: 0 %
Eosinophils Absolute: 0.1 10*3/uL (ref 0.0–0.5)
Eosinophils Relative: 2 %
HCT: 25.7 % — ABNORMAL LOW (ref 39.0–52.0)
Hemoglobin: 8.1 g/dL — ABNORMAL LOW (ref 13.0–17.0)
Immature Granulocytes: 5 %
Lymphocytes Relative: 19 %
Lymphs Abs: 0.9 10*3/uL (ref 0.7–4.0)
MCH: 27.8 pg (ref 26.0–34.0)
MCHC: 31.5 g/dL (ref 30.0–36.0)
MCV: 88.3 fL (ref 80.0–100.0)
Monocytes Absolute: 0.3 10*3/uL (ref 0.1–1.0)
Monocytes Relative: 7 %
Neutro Abs: 3.1 10*3/uL (ref 1.7–7.7)
Neutrophils Relative %: 67 %
Platelets: 179 10*3/uL (ref 150–400)
RBC: 2.91 MIL/uL — ABNORMAL LOW (ref 4.22–5.81)
RDW: 17.2 % — ABNORMAL HIGH (ref 11.5–15.5)
WBC: 4.7 10*3/uL (ref 4.0–10.5)
nRBC: 0.4 % — ABNORMAL HIGH (ref 0.0–0.2)

## 2023-09-21 LAB — IRON AND TIBC
Iron: 83 ug/dL (ref 45–182)
Saturation Ratios: 35 % (ref 17.9–39.5)
TIBC: 239 ug/dL — ABNORMAL LOW (ref 250–450)
UIBC: 156 ug/dL

## 2023-09-21 LAB — RETIC PANEL
Immature Retic Fract: 23.3 % — ABNORMAL HIGH (ref 2.3–15.9)
RBC.: 2.87 MIL/uL — ABNORMAL LOW (ref 4.22–5.81)
Retic Count, Absolute: 74.9 10*3/uL (ref 19.0–186.0)
Retic Ct Pct: 2.6 % (ref 0.4–3.1)
Reticulocyte Hemoglobin: 30.2 pg (ref >27.9)

## 2023-09-21 LAB — FERRITIN: Ferritin: 1626 ng/mL — ABNORMAL HIGH (ref 24–336)

## 2023-09-21 NOTE — Assessment & Plan Note (Signed)
Status post radical nephrectomy.  Recent CT scan is negative. Further follow-up with urology.

## 2023-09-21 NOTE — Assessment & Plan Note (Signed)
Patient has been on Orencia which is held during his radiation.  Currently it appears that he has completed radiation treatments" potential consider resuming Orencia. Discussed with the patient about potential side effects of Orencia and the majority of the biologic treatment options for autoimmune disease, including slight increase of cancer/cancer recurrence.  Patient will need to discuss with rheumatology weighing benefits and risks.

## 2023-09-21 NOTE — Assessment & Plan Note (Addendum)
 Chronic anemia, likely secondary to chronic kidney disease.  Recent drop of hemoglobin possibly secondary to radiation. Check CBC, smear, iron, TIBC ferritin, protein electrophoresis, reticulocyte panel, LDH and haptoglobin.  Lab Results  Component Value Date   HGB 8.1 (L) 09/21/2023   TIBC 239 (L) 09/21/2023   IRONPCTSAT 35 09/21/2023   FERRITIN 1,626 (H) 09/21/2023    Iron panel is not consistent with iron deficiency.  Elevated ferritin, possibly secondary to chronic inflammation. Hemoglobin is less than 10, recommend erythropoietin replacement therapy. Rationale potential side effects were reviewed with patient.  He agreed with plan. Recommend Retacrit 20,000 units x1, repeat hemoglobin in 3 weeks Addendum --Hb 7.9 on 3/6, increase retacrit to 40,000

## 2023-09-21 NOTE — Assessment & Plan Note (Addendum)
Possibly due to chronic inflammation due to autoimmune disease.  Check hemochromatosis mutation PCR.

## 2023-09-21 NOTE — Assessment & Plan Note (Signed)
Status post IMRT and 6 months of adjuvant deprivation therapy.

## 2023-09-21 NOTE — Progress Notes (Addendum)
 Hematology/Oncology Progress note [phone] [phone]            Patient Care Team: Marisue Ivan, NP as PCP - General (Cardiology) Rickard Patience, MD as Consulting Physician (Hematology and Oncology)  ASSESSMENT & PLAN:   Anemia in chronic kidney disease (CKD) Chronic anemia, likely secondary to chronic kidney disease.  Recent drop of hemoglobin possibly secondary to radiation. Check CBC, smear, iron, TIBC ferritin, protein electrophoresis, reticulocyte panel, LDH and haptoglobin.  Lab Results  Component Value Date   HGB 8.1 (L) 09/21/2023   TIBC 239 (L) 09/21/2023   IRONPCTSAT 35 09/21/2023   FERRITIN 1,626 (H) 09/21/2023    Iron panel is not consistent with iron deficiency.  Elevated ferritin, possibly secondary to chronic inflammation. Hemoglobin is less than 10, recommend erythropoietin replacement therapy. Rationale potential side effects were reviewed with patient.  He agreed with plan. Recommend Retacrit 20,000 units x1, repeat hemoglobin in 3 weeks Addendum --Hb 7.9 on 3/6, increase retacrit to 40,000  Clear cell renal cell carcinoma, left (HCC) Status post radical nephrectomy.  Recent CT scan is negative. Further follow-up with urology.  Prostate cancer (HCC) Status post IMRT and 6 months of adjuvant deprivation therapy.  Elevated ferritin Possibly due to chronic inflammation due to autoimmune disease.  Check hemochromatosis mutation PCR.  Rheumatoid arthritis involving multiple sites with positive rheumatoid factor [facility]) Patient has been on Orencia which is held during his radiation.  Currently it appears that he has completed radiation treatments" potential consider resuming Orencia. Discussed with the patient about potential side effects of Orencia and the majority of the biologic treatment options for autoimmune disease, including slight increase of cancer/cancer recurrence.  Patient will need to discuss with rheumatology weighing  benefits and risks.  MGUS (monoclonal gammopathy of unknown significance) Lab Results  Component Value Date   MPROTEIN Not Observed 09/21/2023   KPAFRELGTCHN 245.3 (H) 09/21/2023   LAMBDASER 90.5 (H) 09/21/2023   KAPLAMBRATIO 2.71 (H) 09/21/2023    Light chain MGUS  Observation. Obtain 24 hour UPEP   Orders Placed This Encounter  Procedures   Iron and TIBC    Standing Status:   Future    Number of Occurrences:   1    Expected Date:   09/21/2023    Expiration Date:   09/20/2024   Ferritin    Standing Status:   Future    Number of Occurrences:   1    Expected Date:   09/21/2023    Expiration Date:   03/20/2024   CBC with Differential/Platelet    Standing Status:   Future    Number of Occurrences:   1    Expected Date:   09/21/2023    Expiration Date:   09/20/2024   Multiple Myeloma Panel (SPEP&IFE w/QIG)    Standing Status:   Future    Number of Occurrences:   1    Expected Date:   09/21/2023    Expiration Date:   09/20/2024   Kappa/lambda light chains    Standing Status:   Future    Number of Occurrences:   1    Expected Date:   09/21/2023    Expiration Date:   09/20/2024   Retic Panel    Standing Status:   Future    Number of Occurrences:   1    Expected Date:   09/21/2023    Expiration Date:   09/20/2024   Hepatic function panel    Standing Status:   Future    Number  of Occurrences:   1    Expected Date:   09/21/2023    Expiration Date:   09/20/2024   Technologist smear review    Standing Status:   Future    Number of Occurrences:   1    Expected Date:   09/21/2023    Expiration Date:   09/20/2024    Clinical information::   anemia   Lactate dehydrogenase    Standing Status:   Future    Number of Occurrences:   1    Expected Date:   09/21/2023    Expiration Date:   09/20/2024   PSA    Standing Status:   Future    Number of Occurrences:   1    Expected Date:   09/21/2023    Expiration Date:   09/20/2024   Haptoglobin    Standing Status:   Future    Number of  Occurrences:   1    Expected Date:   09/21/2023    Expiration Date:   09/20/2024   Follow-up in 6 weeks. All questions were answered. The patient knows to call the clinic with any problems, questions or concerns.  Rickard Patience, MD, PhD [facility] Hematology Oncology 09/21/2023   CHIEF COMPLAINTS/REASON FOR VISIT:  Anemia, clearance to resume  Orencia  HISTORY OF PRESENTING ILLNESS:   Michael Centrella. is a  73 y.o.  male with PMH listed below was seen in consultation at the request of  Scoggins, Amber, NP  for evaluation of anemia and leukopenia.  Reviewed patient's previous lab work. He has chronic anemia since at least 2017.  His baseline hemoglobin is around 11. Most recently he had a blood work done on 09/16/2021 which showed a hemoglobin of 10.8.  MCV 82  Chronic leukopenia, baseline of total white count is around 3. Patient has history of rheumatoid arthritis.  Previously on Enbrel.  Recently he has been switched to Humira and status post 1 dose.  He is currently taking prednisone for side effects from Humira.  Patient denies any unintentional weight loss, night sweats, fever.  Chronic arthralgia due to RA.  INTERVAL HISTORY Michael Bohnet. is a 74 y.o. male who has above history reviewed by me today presents  to re-establish care for anemia and potential clearance for Orencia Patient was last seen by me on 04/12/2022  He has a history of low white blood cell count, anemia, and B12 deficiency. He was hospitalized in September 2023 for weight loss, chronic joint pain, and acute kidney injury, during which he received two blood transfusions. His iron levels were checked in January 2025, showing high levels, possibly influenced by rheumatoid arthritis. He has not previously received IV iron infusions and is currently taking iron pills.  He has a history of kidney cancer, for which he underwent nephrectomy on 07/13/2022. Pathology showed pallilary renal cell carcinoma. 07/18/2023  CT scan  showed no evidence of cancer recurrence, Sclerotic focus in the right L4 vertebral body, possibly a bone island, can not rule out metastatic disease.   He was diagnosed with prostate cancer in May 2024 [Gleason 3+4 as well as 4+3 lesion at the left lateral base and left lateral mid up to 37% of the tissue]. completed radiation therapy in November 2024. He received 6 months of ADT is currently taking Flomax.   He has a history of rheumatoid arthritis and was previously on Humira and Enbrel. He was on Orencia, which was held while he was on Radiation.  He has a chronic history of anemia,  08/24/2023 - 04/28/2024 patient was hospitalized due to acute on chronic kidney failure, acute on chronic anemia.  Hemoglobin was 6.7 on admission status post PRBC transfusion during hospitalization.   Review of Systems  Constitutional:  Positive for fatigue. Negative for appetite change, chills, fever and unexpected weight change.  HENT:   Negative for hearing loss and voice change.   Eyes:  Negative for eye problems and icterus.  Respiratory:  Negative for chest tightness, cough and shortness of breath.   Cardiovascular:  Negative for chest pain and leg swelling.  Gastrointestinal:  Negative for abdominal distention and abdominal pain.  Endocrine: Negative for hot flashes.  Genitourinary:  Negative for difficulty urinating, dysuria and frequency.   Musculoskeletal:  Positive for arthralgias.       Chronic joint deformity  Skin:  Negative for itching and rash.  Neurological:  Negative for light-headedness and numbness.  Hematological:  Negative for adenopathy. Does not bruise/bleed easily.  Psychiatric/Behavioral:  Negative for confusion.     MEDICAL HISTORY:  Past Medical History:  Diagnosis Date   Anemia    Aortic atherosclerosis (HCC)    Atrial fibrillation (HCC)    a.) CHA2DS2VASc = 5 (age, HTN, CVA x 2, vascular disease history);  b.) rate/rhythm maintained without pharmacological intervention;  chronically anticoagulated with clopidogrel   Bilateral renal cysts 05/29/2022   Carotid artery disease (HCC) 04/11/2022   a.) doppler 04/11/2022: <50 LICA, no sig RICA.   CVA (cerebral vascular accident) [facility])    a.) MRI brain 01/14/2022: numerous chronic cerebellar infarcts; b.) CT head 04/10/2022: small old lacunar infarcts are seen in cerebellum bilaterally   Diastolic dysfunction 04/11/2022   a.) TTE 04/11/2022: EF 55-60%, mild BAE, mild MR, G1DD.   HLD (hyperlipidemia)    Hypertension 06/02/2016   Long term current use of antithrombotics/antiplatelets    a.) clopidogrel   Long term current use of systemic steroids    a.) prednisone for diagnosis of RA   Neutropenia (HCC)    Prostate cancer (HCC)    Renal cell cancer, left (HCC) 04/10/2022   a.) renal US 04/10/2022: solid heterogenous LEFT renal mass measuring 5.2 cm; b.) MRI ABD - 5.3 x 4.3 cm renal mass to posterior lip of LEFT kidney extending into renal sinus; c.)  renal Bx  05/06/2022 - (+) for RCC with clear cell features   Rheumatoid arthritis involving multiple sites with positive rheumatoid factor (HCC) 06/02/2016   T2DM (type 2 diabetes mellitus) (HCC)    Thoracic ascending aortic aneurysm (HCC) 06/01/2022   a.) CT chest - measured 4.2 cm    SURGICAL HISTORY: Past Surgical History:  Procedure Laterality Date   LAPAROSCOPIC NEPHRECTOMY, HAND ASSISTED Left 07/13/2022   Procedure: HAND ASSISTED LAPAROSCOPIC RADICAL NEPHRECTOMY;  Surgeon: Vanna Scotland, MD;  Location: [facility] ORS;  Service: Urology;  Laterality: Left;   RENAL BIOPSY  05/2022    SOCIAL HISTORY: Social History   Socioeconomic History   Marital status: Married    Spouse name: Felecia   Number of children: Not on file   Years of education: Not on file   Highest education level: Not on file  Occupational History   Not on file  Tobacco Use   Smoking status: Former    Current packs/day: 0.00    Average packs/day: 0.5 packs/day for 7.0 years (3.5 ttl  pk-yrs)    Types: Cigarettes    Start date: 03/16/1971    Quit date: 03/15/1978    Years  since quitting: 45.5    Passive exposure: Past   Smokeless tobacco: Never  Vaping Use   Vaping status: Never Used  Substance and Sexual Activity   Alcohol use: Not Currently   Drug use: No   Sexual activity: Yes  Other Topics Concern   Not on file  Social History Narrative   Not on file   Social Drivers of Health   Financial Resource Strain: Not on file  Food Insecurity: No Food Insecurity (08/24/2023)   Hunger Vital Sign    Worried About Running Out of Food in the Last Year: Never true    Ran Out of Food in the Last Year: Never true  Transportation Needs: No Transportation Needs (08/24/2023)   PRAPARE - Administrator, Civil Service (Medical): No    Lack of Transportation (Non-Medical): No  Physical Activity: Not on file  Stress: Not on file  Social Connections: Socially Integrated (08/24/2023)   Social Connection and Isolation Panel [NHANES]    Frequency of Communication with Friends and Family: More than three times a week    Frequency of Social Gatherings with Friends and Family: More than three times a week    Attends Religious Services: More than 4 times per year    Active Member of [employer] or Organizations: Yes    Attends Engineer, structural: More than 4 times per year    Marital Status: Married  Catering manager Violence: Not At Risk (08/24/2023)   Humiliation, Afraid, Rape, and Kick questionnaire    Fear of Current or Ex-Partner: No    Emotionally Abused: No    Physically Abused: No    Sexually Abused: No    FAMILY HISTORY: Family History  Problem Relation Age of Onset   Heart disease Mother    Diabetes Mother    Breast cancer Mother    Kidney disease Father    Diabetes Father    Diabetes Daughter     ALLERGIES:  has no known allergies.  MEDICATIONS:  Current Outpatient Medications  Medication Sig Dispense Refill   Abatacept (ORENCIA CLICKJECT)  125 MG/ML SOAJ Inject 125 mg into the skin every 14 (fourteen) days. 4 mL 0   Cholecalciferol (VITAMIN D-3) 125 MCG (5000 UT) TABS Take 1 tablet by mouth daily. 30 tablet 4   Ferrous Sulfate (IRON PO) Take 1 tablet by mouth daily.     folic acid (FOLVITE) 1 MG tablet Take 1 tablet (1 mg total) by mouth daily. 30 tablet 1   losartan (COZAAR) 50 MG tablet Take 1 tablet (50 mg total) by mouth daily. 90 tablet 1   mirtazapine (REMERON) 7.5 MG tablet Take 1 tablet (7.5 mg total) by mouth at bedtime. 30 tablet 3   rosuvastatin (CRESTOR) 40 MG tablet Take 1 tablet (40 mg total) by mouth daily. 90 tablet 1   tamsulosin (FLOMAX) 0.4 MG CAPS capsule Take 1 capsule (0.4 mg total) by mouth daily after supper. 90 capsule 4   triamcinolone ointment (KENALOG) 0.5 % Apply 1 Application topically 2 (two) times daily. 30 g 3   No current facility-administered medications for this visit.     PHYSICAL EXAMINATION: ECOG PERFORMANCE STATUS: 1 - Symptomatic but completely ambulatory Vitals:   09/21/23 1047  BP: 137/74  Pulse: 70  Resp: 18  Temp: (!) 96 F (35.6 C)  SpO2: 100%   Filed Weights   09/21/23 1047  Weight: 193 lb 1.6 oz (87.6 kg)    Physical Exam Constitutional:  General: He is not in acute distress. HENT:     Head: Normocephalic and atraumatic.  Eyes:     General: No scleral icterus. Cardiovascular:     Rate and Rhythm: Normal rate and regular rhythm.     Heart sounds: Normal heart sounds.  Pulmonary:     Effort: Pulmonary effort is normal. No respiratory distress.     Breath sounds: No wheezing.  Abdominal:     General: Bowel sounds are normal. There is no distension.     Palpations: Abdomen is soft.  Musculoskeletal:     Cervical back: Normal range of motion and neck supple.     Comments: Range of motion of right knee is limited due to RA. Chronic joint swelling and deformity  Skin:    General: Skin is warm and dry.     Findings: No erythema or rash.  Neurological:      Mental Status: He is alert and oriented to person, place, and time. Mental status is at baseline.     Cranial Nerves: No cranial nerve deficit.     Coordination: Coordination normal.  Psychiatric:        Mood and Affect: Mood normal.     LABORATORY DATA:  I have reviewed the data as listed Lab Results  Component Value Date   WBC 4.7 09/21/2023   HGB 8.1 (L) 09/21/2023   HCT 25.7 (L) 09/21/2023   MCV 88.3 09/21/2023   PLT 179 09/21/2023   Recent Labs    08/06/23 1047 08/24/23 1117 08/25/23 0538 08/26/23 0517 08/27/23 0727 08/28/23 0530 08/29/23 0427 09/21/23 1133  NA 139 138   < > 141 143 138 138  --   K 3.8 4.4   < > 4.3 4.3 4.0 3.9  --   CL 108 108   < > 112* 110 103 100  --   CO2 18* 16*   < > 19* 23 25 29   --   GLUCOSE 83 87   < > 94 83 198* 152*  --   BUN 34* 93*   < > 72* 69* 61* 55*  --   CREATININE 2.65* 6.60*   < > 4.74* 4.36* 3.75* 3.47*  --   CALCIUM 9.4 9.4   < > 8.8* 9.0 8.3* 8.4*  --   GFRNONAA  --  8*   < > 13* 14* 17* 18*  --   PROT 7.3 7.8  --   --   --   --   --  7.1  ALBUMIN  --  3.5  --  2.9*  --   --   --  3.2*  AST 86* 36  --   --   --   --   --  32  ALT 46 27  --   --   --   --   --  32  ALKPHOS  --  68  --   --   --   --   --  71  BILITOT 0.7 0.7  --   --   --   --   --  0.5  BILIDIR  --   --   --   --   --   --   --  0.1  IBILI  --   --   --   --   --   --   --  0.4   < > = values in this interval not displayed.   Iron/TIBC/Ferritin/ %Sat    Component  Value Date/Time   IRON 83 09/21/2023 1133   TIBC 239 (L) 09/21/2023 1133   FERRITIN 1,626 (H) 09/21/2023 1133   IRONPCTSAT 35 09/21/2023 1133   IRONPCTSAT 37 03/06/2022 1342      RADIOGRAPHIC STUDIES: I have personally reviewed the radiological images as listed and agreed with the findings in the report. US Renal Result Date: 08/24/2023 CLINICAL DATA:  Acute on chronic renal failure. EXAM: RENAL / URINARY TRACT ULTRASOUND COMPLETE COMPARISON:  CT scan abdomen and pelvis from  07/14/2023. FINDINGS: Right Kidney: Renal measurements: 5.8 x 6.6 x 11.9 cm = volume: 236.2 mL. There is increased cortical echogenicity, nonspecific but commonly seen with medical renal disease. There is probable subtle fullness in the renal pelvis however, no frank hydronephrosis. No contour deforming mass or stone large enough to cause acoustic shadowing. There is a simple cyst in the lower pole measuring 1.7 x 2.2 x 3.1 cm. Left Kidney: Surgically absent. Bladder: Appears normal for degree of bladder distention. Prevoid urinary volume- 444 mL. Postvoid residual urine volume-289 mL. Other: None. IMPRESSION: 1. Surgically absent left kidney. 2. Increased right renal cortical echogenicity, nonspecific but commonly seen with medical renal disease. 3. There is a 3.1 cm simple cyst in the right kidney lower pole. 4. Otherwise unremarkable exam. Electronically Signed   By: Jules Schick M.D.   On: 08/24/2023 16:53      ASSESSMENT & PLAN:  1. Normocytic anemia    ##Chronic leukopenia, likely due to chronic inflammation and rheumatoid arthritis treatments. Labs reviewed and discussed with patient. Leukopenia has improved, total white count is normal.  This is possibly secondary to recent steroid use.Peripheral blood flow cytometry showed no immunophenotypic abnormality.  Negative hepatitis panel, negative HIV. No M protein on protein electrophoresis.  Slight increase light chain ratio which is nonspecific-most likely due to chronic inflammation. Recommend 24-hour urine protein electrophoresis.  #Vitamin B12 level is borderline low at 198.  Recommend patient to take over-the-counter vitamin B12 1000 mcg daily. Plan to repeat B12 level at the next visit in 6 months.  If no improvement, can consider B12 injections.  #Elevated ferritin level, likely due to chronic inflammation due to RA. Check hemochromatosis mutation screening.  #Normocytic anemia, hemoglobin 10.8, likely due to chronic inflammation.   Monitor.  Orders Placed This Encounter  Procedures   Iron and TIBC    Standing Status:   Future    Number of Occurrences:   1    Expected Date:   09/21/2023    Expiration Date:   09/20/2024   Ferritin    Standing Status:   Future    Number of Occurrences:   1    Expected Date:   09/21/2023    Expiration Date:   03/20/2024   CBC with Differential/Platelet    Standing Status:   Future    Number of Occurrences:   1    Expected Date:   09/21/2023    Expiration Date:   09/20/2024   Multiple Myeloma Panel (SPEP&IFE w/QIG)    Standing Status:   Future    Number of Occurrences:   1    Expected Date:   09/21/2023    Expiration Date:   09/20/2024   Kappa/lambda light chains    Standing Status:   Future    Number of Occurrences:   1    Expected Date:   09/21/2023    Expiration Date:   09/20/2024   Retic Panel    Standing Status:   Future  Number of Occurrences:   1    Expected Date:   09/21/2023    Expiration Date:   09/20/2024   Hepatic function panel    Standing Status:   Future    Number of Occurrences:   1    Expected Date:   09/21/2023    Expiration Date:   09/20/2024   Technologist smear review    Standing Status:   Future    Number of Occurrences:   1    Expected Date:   09/21/2023    Expiration Date:   09/20/2024    Clinical information::   anemia   Lactate dehydrogenase    Standing Status:   Future    Number of Occurrences:   1    Expected Date:   09/21/2023    Expiration Date:   09/20/2024   PSA    Standing Status:   Future    Number of Occurrences:   1    Expected Date:   09/21/2023    Expiration Date:   09/20/2024   Haptoglobin    Standing Status:   Future    Number of Occurrences:   1    Expected Date:   09/21/2023    Expiration Date:   09/20/2024    All questions were answered. The patient knows to call the clinic with any problems questions or concerns.  c Scoggins, Hospital doctor, NP    Return of visit: 6 months  Rickard Patience, MD, PhD [facility] Hematology  Oncology 09/21/2023

## 2023-09-22 ENCOUNTER — Telehealth: Payer: Self-pay

## 2023-09-22 LAB — HAPTOGLOBIN: Haptoglobin: 226 mg/dL (ref 32–363)

## 2023-09-22 LAB — KAPPA/LAMBDA LIGHT CHAINS
Kappa free light chain: 245.3 mg/L — ABNORMAL HIGH (ref 3.3–19.4)
Kappa, lambda light chain ratio: 2.71 — ABNORMAL HIGH (ref 0.26–1.65)
Lambda free light chains: 90.5 mg/L — ABNORMAL HIGH (ref 5.7–26.3)

## 2023-09-22 LAB — PSA: Prostatic Specific Antigen: 0.03 ng/mL (ref 0.00–4.00)

## 2023-09-22 NOTE — Telephone Encounter (Signed)
-----   Message from Rickard Patience sent at 09/21/2023  8:22 PM EST ----- I please let patient know that I recommend him to receive erythropoietin replacement injection.  We discussed about this possibility during the visit. Please arrange patient to get 1 dose of Retacrit 20,000 unit this week. Repeat H&H 3 weeks after Retacrit +/- Retacrit injection. He will follow-up with me in 6 weeks, lab same day with MD +/- Retacrit.  All labs are ordered.  Thank you

## 2023-09-22 NOTE — Telephone Encounter (Signed)
Spoke to [place], wife, and informed of MD recommendation and follow up plan. She would like a call to set up appts, due to work schedule.   Retacit (20,000) this week Lab/+/- Retacrit (3 weeks after this weeks inj) Lab/MD/+/-  retacrit in 6 weeks

## 2023-09-23 ENCOUNTER — Inpatient Hospital Stay: Payer: Medicare PPO

## 2023-09-23 VITALS — BP 127/64

## 2023-09-23 DIAGNOSIS — M069 Rheumatoid arthritis, unspecified: Secondary | ICD-10-CM | POA: Diagnosis not present

## 2023-09-23 DIAGNOSIS — Z905 Acquired absence of kidney: Secondary | ICD-10-CM | POA: Diagnosis not present

## 2023-09-23 DIAGNOSIS — D631 Anemia in chronic kidney disease: Secondary | ICD-10-CM

## 2023-09-23 DIAGNOSIS — Z85528 Personal history of other malignant neoplasm of kidney: Secondary | ICD-10-CM | POA: Diagnosis not present

## 2023-09-23 DIAGNOSIS — Z8546 Personal history of malignant neoplasm of prostate: Secondary | ICD-10-CM | POA: Diagnosis not present

## 2023-09-23 DIAGNOSIS — R7989 Other specified abnormal findings of blood chemistry: Secondary | ICD-10-CM | POA: Diagnosis not present

## 2023-09-23 DIAGNOSIS — Z923 Personal history of irradiation: Secondary | ICD-10-CM | POA: Diagnosis not present

## 2023-09-23 DIAGNOSIS — N1832 Chronic kidney disease, stage 3b: Secondary | ICD-10-CM | POA: Diagnosis not present

## 2023-09-23 DIAGNOSIS — D72819 Decreased white blood cell count, unspecified: Secondary | ICD-10-CM | POA: Diagnosis not present

## 2023-09-23 MED ORDER — EPOETIN ALFA-EPBX 20000 UNIT/ML IJ SOLN
20000.0000 [IU] | INTRAMUSCULAR | Status: DC
Start: 2023-09-23 — End: 2023-09-23
  Administered 2023-09-23: 20000 [IU] via SUBCUTANEOUS
  Filled 2023-09-23: qty 1

## 2023-09-24 ENCOUNTER — Telehealth: Payer: Self-pay | Admitting: Pharmacist

## 2023-09-24 LAB — MULTIPLE MYELOMA PANEL, SERUM
Albumin SerPl Elph-Mcnc: 3.5 g/dL (ref 2.9–4.4)
Albumin/Glob SerPl: 1.1 (ref 0.7–1.7)
Alpha 1: 0.2 g/dL (ref 0.0–0.4)
Alpha2 Glob SerPl Elph-Mcnc: 0.8 g/dL (ref 0.4–1.0)
B-Globulin SerPl Elph-Mcnc: 1.6 g/dL — ABNORMAL HIGH (ref 0.7–1.3)
Gamma Glob SerPl Elph-Mcnc: 0.8 g/dL (ref 0.4–1.8)
Globulin, Total: 3.4 g/dL (ref 2.2–3.9)
IgA: 995 mg/dL — ABNORMAL HIGH (ref 61–437)
IgG (Immunoglobin G), Serum: 1156 mg/dL (ref 603–1613)
IgM (Immunoglobulin M), Srm: 71 mg/dL (ref 20–172)
Total Protein ELP: 6.9 g/dL (ref 6.0–8.5)

## 2023-09-24 NOTE — Telephone Encounter (Signed)
Submitted a Prior Authorization RENEWAL request to [facility] for ORENCIA SQ via CoverMyMeds. Will update once we receive a response.  Key: [id number]

## 2023-09-24 NOTE — Telephone Encounter (Signed)
Received notification from [facility] regarding a prior authorization for ORENCIA SQ. Authorization has been APPROVED from 09/24/2023 to 08/09/2024. Approval letter sent to scan center.  Patient can continue to fill through [facility]: [phone]   Authorization # [id number]  Chesley Mires, PharmD, MPH, BCPS, CPP Clinical Pharmacist (Rheumatology and Pulmonology)

## 2023-10-04 ENCOUNTER — Encounter: Payer: Self-pay | Admitting: Oncology

## 2023-10-05 DIAGNOSIS — I10 Essential (primary) hypertension: Secondary | ICD-10-CM | POA: Diagnosis not present

## 2023-10-05 DIAGNOSIS — D631 Anemia in chronic kidney disease: Secondary | ICD-10-CM | POA: Diagnosis not present

## 2023-10-05 DIAGNOSIS — N1832 Chronic kidney disease, stage 3b: Secondary | ICD-10-CM | POA: Diagnosis not present

## 2023-10-06 ENCOUNTER — Encounter: Payer: Self-pay | Admitting: Oncology

## 2023-10-07 ENCOUNTER — Telehealth: Payer: Self-pay | Admitting: Radiation Oncology

## 2023-10-07 NOTE — Progress Notes (Deleted)
 Office Visit Note  Patient: Michael Hinton.             Date of Birth: 01-31-1951           MRN: [id number]             PCP: Marisue Ivan, NP Referring: Marisue Ivan, NP Visit Date: 10/21/2023 Occupation: @GUAROCC @  Subjective:  No chief complaint on file.   History of Present Illness: Michael Hinton. is a 73 y.o. male ***     Activities of Daily Living:  Patient reports morning stiffness for *** {minute/hour:19697}.   Patient {ACTIONS;DENIES/REPORTS:21021675::"Denies"} nocturnal pain.  Difficulty dressing/grooming: {ACTIONS;DENIES/REPORTS:21021675::"Denies"} Difficulty climbing stairs: {ACTIONS;DENIES/REPORTS:21021675::"Denies"} Difficulty getting out of chair: {ACTIONS;DENIES/REPORTS:21021675::"Denies"} Difficulty using hands for taps, buttons, cutlery, and/or writing: {ACTIONS;DENIES/REPORTS:21021675::"Denies"}  No Rheumatology ROS completed.   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   Weakness    AKI (acute kidney injury) (HCC)    Anemia in chronic kidney disease (CKD)    Dysphagia    Unintentional weight loss    Hypercalcemia    Renal mass 04/10/2022   Vitamin B12 deficiency 11/04/2021   DDD (degenerative disc disease), lumbar 03/11/2018   ANA positive 01/22/2017   Leucopenia 01/22/2017   Contracture of elbow 08/23/2016   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   Rheumatoid arthritis involving multiple sites with positive rheumatoid factor (HCC) 06/02/2016   High risk medication use 06/02/2016   Essential hypertension 06/02/2016   Vitamin D deficiency 06/02/2016    Past Medical History:  Diagnosis Date   Anemia     Aortic atherosclerosis (HCC)    Atrial fibrillation (HCC)    a.) CHA2DS2VASc = 5 (age, HTN, CVA x 2, vascular disease history);  b.) rate/rhythm maintained without pharmacological intervention; chronically anticoagulated with clopidogrel   Bilateral renal cysts 05/29/2022   Carotid artery disease (HCC) 04/11/2022   a.) doppler 04/11/2022: <50 LICA, no sig RICA.   CVA (cerebral vascular accident) [facility])    a.) MRI brain 01/14/2022: numerous chronic cerebellar infarcts; b.) CT head 04/10/2022: small old lacunar infarcts are seen in cerebellum bilaterally   Diastolic dysfunction 04/11/2022   a.) TTE 04/11/2022: EF 55-60%, mild BAE, mild MR, G1DD.   HLD (hyperlipidemia)    Hypertension 06/02/2016   Long term current use of antithrombotics/antiplatelets    a.) clopidogrel   Long term current use of systemic steroids    a.) prednisone for diagnosis of RA   Neutropenia (HCC)    Prostate cancer (HCC)    Renal cell cancer, left (HCC) 04/10/2022   a.) renal US 04/10/2022: solid heterogenous LEFT renal mass measuring 5.2 cm; b.) MRI ABD - 5.3 x 4.3 cm renal mass to posterior lip of LEFT kidney extending into renal sinus; c.)  renal Bx  05/06/2022 - (+) for RCC with clear cell features   Rheumatoid arthritis involving multiple sites with positive rheumatoid factor (HCC) 06/02/2016   T2DM (type 2 diabetes mellitus) (HCC)    Thoracic ascending aortic aneurysm (HCC) 06/01/2022   a.) CT chest - measured 4.2 cm    Family History  Problem Relation Age of Onset   Heart disease Mother    Diabetes Mother  Breast cancer Mother    Kidney disease Father    Diabetes Father    Diabetes Daughter    Past Surgical History:  Procedure Laterality Date   LAPAROSCOPIC NEPHRECTOMY, HAND ASSISTED Left 07/13/2022   Procedure: HAND ASSISTED LAPAROSCOPIC RADICAL NEPHRECTOMY;  Surgeon: Vanna Scotland, MD;  Location: [facility] ORS;  Service: Urology;  Laterality: Left;   RENAL BIOPSY  05/2022   Social History   Social  History Narrative   Not on file   Immunization History  Administered Date(s) Administered   Moderna Sars-Covid-2 Vaccination 10/12/2019, 11/15/2019   PPD Test 03/20/2022, 08/23/2023     Objective: Vital Signs: There were no vitals taken for this visit.   Physical Exam   Musculoskeletal Exam: ***  CDAI Exam: CDAI Score: -- Patient Global: --; Provider Global: -- Swollen: --; Tender: -- Joint Exam 10/21/2023   No joint exam has been documented for this visit   There is currently no information documented on the homunculus. Go to the Rheumatology activity and complete the homunculus joint exam.  Investigation: No additional findings.  Imaging: No results found.  Recent Labs: Lab Results  Component Value Date   WBC 4.7 09/21/2023   HGB 8.1 (L) 09/21/2023   PLT 179 09/21/2023   NA 138 08/29/2023   K 3.9 08/29/2023   CL 100 08/29/2023   CO2 29 08/29/2023   GLUCOSE 152 (H) 08/29/2023   BUN 55 (H) 08/29/2023   CREATININE 3.47 (H) 08/29/2023   BILITOT 0.5 09/21/2023   ALKPHOS 71 09/21/2023   AST 32 09/21/2023   ALT 32 09/21/2023   PROT 7.1 09/21/2023   ALBUMIN 3.2 (L) 09/21/2023   CALCIUM 8.4 (L) 08/29/2023   GFRAA 86 02/03/2021   QFTBGOLD NEGATIVE 06/16/2017   QFTBGOLDPLUS INDETERMINATE (A) 08/06/2023    Speciality Comments: TB Skin Test 03/23/2022: Negative  Procedures:  No procedures performed Allergies: Patient has no known allergies.   Assessment / Plan:     Visit Diagnoses: Rheumatoid arthritis involving multiple sites with positive rheumatoid factor (HCC)  High risk medication use  Other drug-induced neutropenia (HCC)  Long term systemic steroid user  Renal cell adenoma, left  ANA positive  Contracture of left elbow  Contracture of right elbow  Contractures of both knees  Vitamin D deficiency  Primary hypertension  Spinal stenosis of lumbar region without neurogenic claudication  Prostate cancer (HCC)  Orders: No orders of the  defined types were placed in this encounter.  No orders of the defined types were placed in this encounter.   Face-to-face time spent with patient was *** minutes. Greater than 50% of time was spent in counseling and coordination of care.  Follow-Up Instructions: No follow-ups on file.   Gearldine Bienenstock, PA-C  Note - This record has been created using Dragon software.  Chart creation errors have been sought, but may not always  have been located. Such creation errors do not reflect on  the standard of medical care.

## 2023-10-07 NOTE — Telephone Encounter (Signed)
 Called patient and changed appointment to accommodate patient.

## 2023-10-07 NOTE — Telephone Encounter (Signed)
 Patients wife called to see if his radiation labs on 3/5 can be combined with the lab/ injection appointment he has scheduled on 3/6. Please call her back at [phone] to let her know

## 2023-10-13 ENCOUNTER — Other Ambulatory Visit: Payer: Medicare PPO

## 2023-10-13 NOTE — Progress Notes (Signed)
 Office Visit Note  Patient: Michael Hinton.             Date of Birth: 22-Jan-1951           MRN: [id number]             PCP: Marisue Ivan, NP Referring: Marisue Ivan, NP Visit Date: 10/27/2023 Occupation: @GUAROCC @  Subjective:  Discuss orencia use   History of Present Illness: Michael Amedee. is a 73 y.o. male with history of seropositive rheumatoid arthritis.   Patient is currently taking prednisone 5 mg 1 tablet by mouth daily.  Overall his symptoms have been manageable with the use of prednisone.  He continues to have some inflammation in both hands but the pain has improved.  He has ongoing discomfort in his right knee and has been using a compression sleeve for support.  Patient states that Dr. Cathie Hoops discouraged the use of resuming orencia due to the risk of malignancy and leukoopenia. He continues to receive retacrit infusions.  He denies any recent or recurrent infections.    Activities of Daily Living:  Patient reports morning stiffness for 1 hour.   Patient Denies nocturnal pain.  Difficulty dressing/grooming: Denies Difficulty climbing stairs: Reports Difficulty getting out of chair: Reports Difficulty using hands for taps, buttons, cutlery, and/or writing: Denies  Review of Systems  Constitutional:  Negative for fatigue.  HENT:  Negative for mouth sores and mouth dryness.   Eyes:  Negative for dryness.  Respiratory:  Negative for shortness of breath.   Cardiovascular:  Negative for chest pain and palpitations.  Gastrointestinal:  Negative for blood in stool, constipation and diarrhea.  Endocrine: Positive for increased urination.  Genitourinary:  Negative for involuntary urination.  Musculoskeletal:  Positive for joint pain, joint pain, joint swelling and morning stiffness. Negative for gait problem, myalgias, muscle weakness, muscle tenderness and myalgias.  Skin:  Negative for color change, hair loss and sensitivity to sunlight.  Allergic/Immunologic:  Negative for susceptible to infections.  Neurological:  Negative for dizziness and headaches.  Hematological:  Negative for swollen glands.  Psychiatric/Behavioral:  Negative for depressed mood and sleep disturbance. The patient is not nervous/anxious.     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   Weakness    AKI (acute kidney injury) (HCC)    Anemia in chronic kidney disease (CKD)    Dysphagia    Unintentional weight loss    Hypercalcemia    Renal mass 04/10/2022   Vitamin B12 deficiency 11/04/2021   DDD (degenerative disc disease), lumbar 03/11/2018   ANA positive 01/22/2017   Leucopenia 01/22/2017   Contracture of elbow 08/23/2016   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   Rheumatoid arthritis involving multiple sites with positive rheumatoid factor (HCC) 06/02/2016   High risk medication use 06/02/2016   Essential hypertension 06/02/2016   Vitamin D deficiency 06/02/2016    Past Medical History:  Diagnosis Date   Anemia    Aortic atherosclerosis (HCC)    Atrial fibrillation (HCC)    a.) CHA2DS2VASc = 5 (age, HTN, CVA x 2, vascular disease history);  b.) rate/rhythm maintained  without pharmacological intervention; chronically anticoagulated with clopidogrel   Bilateral renal cysts 05/29/2022   Carotid artery disease (HCC) 04/11/2022   a.) doppler 04/11/2022: <50 LICA, no sig RICA.   CVA (cerebral vascular accident) [facility])    a.) MRI brain 01/14/2022: numerous chronic cerebellar infarcts; b.) CT head 04/10/2022: small old lacunar infarcts are seen in cerebellum bilaterally    Diastolic dysfunction 04/11/2022   a.) TTE 04/11/2022: EF 55-60%, mild BAE, mild MR, G1DD.   HLD (hyperlipidemia)    Hypertension 06/02/2016   Long term current use of antithrombotics/antiplatelets    a.) clopidogrel   Long term current use of systemic steroids    a.) prednisone for diagnosis of RA   Neutropenia (HCC)    Prostate cancer (HCC)    Renal cell cancer, left (HCC) 04/10/2022   a.) renal US 04/10/2022: solid heterogenous LEFT renal mass measuring 5.2 cm; b.) MRI ABD - 5.3 x 4.3 cm renal mass to posterior lip of LEFT kidney extending into renal sinus; c.)  renal Bx  05/06/2022 - (+) for RCC with clear cell features   Rheumatoid arthritis involving multiple sites with positive rheumatoid factor (HCC) 06/02/2016   T2DM (type 2 diabetes mellitus) (HCC)    Thoracic ascending aortic aneurysm (HCC) 06/01/2022   a.) CT chest - measured 4.2 cm    Family History  Problem Relation Age of Onset   Heart disease Mother    Diabetes Mother    Breast cancer Mother    Kidney disease Father    Diabetes Father    Diabetes Daughter    Past Surgical History:  Procedure Laterality Date   LAPAROSCOPIC NEPHRECTOMY, HAND ASSISTED Left 07/13/2022   Procedure: HAND ASSISTED LAPAROSCOPIC RADICAL NEPHRECTOMY;  Surgeon: Vanna Scotland, MD;  Location: [facility] ORS;  Service: Urology;  Laterality: Left;   RENAL BIOPSY  05/2022   Social History   Social History Narrative   Not on file   Immunization History  Administered Date(s) Administered   Moderna Sars-Covid-2 Vaccination 10/12/2019, 11/15/2019   PPD Test 03/20/2022, 08/23/2023     Objective: Vital Signs: BP 137/71 (BP Location: Left Arm, Patient Position: Sitting, Cuff Size: Normal)   Pulse 74   Ht 5\' 11"  (1.803 m)   Wt 205 lb (93 kg)   BMI 28.59 kg/m    Physical Exam Vitals and nursing note reviewed.  Constitutional:      Appearance: He is well-developed.  HENT:     Head: Normocephalic and atraumatic.  Eyes:      Conjunctiva/sclera: Conjunctivae normal.     Pupils: Pupils are equal, round, and reactive to light.  Cardiovascular:     Rate and Rhythm: Normal rate and regular rhythm.     Heart sounds: Normal heart sounds.  Pulmonary:     Effort: Pulmonary effort is normal.     Breath sounds: Normal breath sounds.  Abdominal:     General: Bowel sounds are normal.     Palpations: Abdomen is soft.  Musculoskeletal:     Cervical back: Normal range of motion and neck supple.  Skin:    General: Skin is warm and dry.     Capillary Refill: Capillary refill takes less than 2 seconds.  Neurological:     Mental Status: He is alert and oriented to person, place, and time.  Psychiatric:        Behavior: Behavior normal.      Musculoskeletal Exam: C-spine has limited range of motion.  Thoracic kyphosis noted.  Shoulder joints have limited abduction  and internal rotation bilaterally.  Flexion contractures of both elbows noted.  Warmth and tenderness along the left elbow joint line.  Limited range of motion of both wrist joints with synovial thickening.  Synovial thickening of all MCP joints.  Incomplete fist formation noted bilaterally.  Inflammation in the right first MCP and right first and second PIP joint.  Inflammation in the left third MCP and PIP joint.  Flexion contractures of both knees, right more severe than left.  Difficulty ambulating due to inability to extend knees.  Synovial thickening and tenderness of both ankles.  CDAI Exam: CDAI Score: -- Patient Global: --; Provider Global: -- Swollen: --; Tender: -- Joint Exam 10/27/2023   No joint exam has been documented for this visit   There is currently no information documented on the homunculus. Go to the Rheumatology activity and complete the homunculus joint exam.  Investigation: No additional findings.  Imaging: No results found.  Recent Labs: Lab Results  Component Value Date   WBC 4.7 09/21/2023   HGB 7.9 (L) 10/14/2023   PLT 179  09/21/2023   NA 138 08/29/2023   K 3.9 08/29/2023   CL 100 08/29/2023   CO2 29 08/29/2023   GLUCOSE 152 (H) 08/29/2023   BUN 55 (H) 08/29/2023   CREATININE 3.47 (H) 08/29/2023   BILITOT 0.5 09/21/2023   ALKPHOS 71 09/21/2023   AST 32 09/21/2023   ALT 32 09/21/2023   PROT 7.1 09/21/2023   ALBUMIN 3.2 (L) 09/21/2023   CALCIUM 8.4 (L) 08/29/2023   GFRAA 86 02/03/2021   QFTBGOLD NEGATIVE 06/16/2017   QFTBGOLDPLUS INDETERMINATE (A) 08/06/2023    Speciality Comments: TB Skin Test 03/23/2022: Negative  Procedures:  No procedures performed Allergies: Patient has no known allergies.  CBC and renal panel updated on 10/05/23    Assessment / Plan:     Visit Diagnoses: Rheumatoid arthritis involving multiple sites with positive rheumatoid factor [facility]): Patient presents today with ongoing pain involving multiple joints.  His discomfort has been most severe in both knees, right greater than left.  He has been using a compression sleeve on his right knee for support.  He is currently taking prednisone 5 mg 1 tablet by mouth daily.  He has been off of Orencia since October 2024 due to undergoing radiation therapy for prostate cancer.  Radiation oncology cleared the patient to resume Orencia on 08/30/2023.  Unfortunately the patient was found to have leukopenia and anemia requiring intervention-he has been receiving retacrit infusions under the care of Dr. Cathie Hoops. Apprehensive to resume Orencia due to ongoing anemia---currently under the care of Dr. Meribeth Mattes office visit note from 09/21/2023.  Hemoglobin was 7.9 and hematocrit was 25.2 on 10/14/2023.  White blood cell count was within normal limits 5.2 and absolute neutrophils were within normal limits on 10/05/2023.  Absolute lymphocytes remain low at 624 on 10/05/23.  Plan to reach out to Dr. Roda Shutters to get her opinion on reinitiating orencia in the future.  For now he will remain on prednisone 5 mg daily.   High risk medication use - Holding Orencia.  Not  currently taking any immunosuppressive agents.  Orencia was previously started on 10/28/22--He was spacing orencia by every 14 days due to history of neutropenia.  He has been off of Orencia since October 2024. Under the care of Dr. Cathie Hoops. Receiving retacrit infusions.  PPD negative on 08/26/2023. History of nephrectomy-not a candidate for methotrexate.  He is avoiding NSAID use.  He will continue to require close lab monitoring.  Other drug-induced neutropenia (HCC): Previously on Enbrel-was previously spacing dose.  Ongoing neutropenia and anemia. Under care of Dr. Cathie Hoops.   Long term systemic steroid user: He remains on Prednisone 5 mg 1 tablet by mouth daily.  He is aware of the risks of long term prednisone use.  Renal cell adenoma, left: Incidental finding while hospitalized 9/1 to 04/14/2022.  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 Patient was admitted on 07/13/2022 for scheduled hand-assisted laparoscopic left radical nephrectomy with Dr. Apolinar Junes for management.   CT abdomen and pelvis 07/14/2023: Status post left nephrectomy without evidence of local recurrence or convincing evidence of metastatic disease in the abdomen or pelvis. Plan to continue to repeat CT abdomen and pelvis and chest x-ray annually for the first 3 years. Renal ultrasound performed on 08/24/2023: Surgically absent left kidney.  Increased right renal cortical agenesis that he, nonspecific but commonly seen in medical renal disease.  3.1 cm simple cyst in right kidney lower pole noted.  Prostate cancer (HCC) - Prostate Cancer (Stage II, Gleason 7 (4+3), T1C, N0, M0).  Completed IMRT radiation therapy.  Under care of Dr. Bascom Levels office visit note from 10/20/2023 felt to be under excellent biochemical control of prostate cancer.  PSA was 0.02 on 10/14/2023--plan to recheck in 6 months.   ANA  positive - No clinical features of systemic lupus at this time.  Contracture of left elbow: Tenderness and warmth noted over the left elbow joint line.  Contracture of right elbow: No active inflammation noted today.   Contractures of both knees: Flexion contractures of both knees, right > left.  Using compression sleeve on his right knee for support.  He remains on prednisone 5 mg 1 tablet daily.  Unable to take NSAIDs.  The medical conditions are listed as follows:  Vitamin D deficiency  Primary hypertension: Blood pressure was 137/71 today in the office.  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  No orders of the defined types were placed in this encounter.    Follow-Up Instructions: Return in 3 months (on 01/27/2024) for Rheumatoid arthritis.   Gearldine Bienenstock, PA-C  Note - This record has been created using Dragon software.  Chart creation errors have been sought, but may not always  have been located. Such creation errors do not reflect on  the standard of medical care.

## 2023-10-14 ENCOUNTER — Telehealth: Payer: Self-pay

## 2023-10-14 ENCOUNTER — Inpatient Hospital Stay: Payer: Medicare PPO

## 2023-10-14 ENCOUNTER — Other Ambulatory Visit: Payer: Self-pay | Admitting: Physician Assistant

## 2023-10-14 ENCOUNTER — Other Ambulatory Visit: Payer: Self-pay | Admitting: Oncology

## 2023-10-14 ENCOUNTER — Encounter: Payer: Self-pay | Admitting: Oncology

## 2023-10-14 ENCOUNTER — Inpatient Hospital Stay: Payer: Medicare PPO | Attending: Oncology

## 2023-10-14 VITALS — BP 129/86

## 2023-10-14 DIAGNOSIS — Z923 Personal history of irradiation: Secondary | ICD-10-CM | POA: Insufficient documentation

## 2023-10-14 DIAGNOSIS — C61 Malignant neoplasm of prostate: Secondary | ICD-10-CM

## 2023-10-14 DIAGNOSIS — D472 Monoclonal gammopathy: Secondary | ICD-10-CM | POA: Insufficient documentation

## 2023-10-14 DIAGNOSIS — D631 Anemia in chronic kidney disease: Secondary | ICD-10-CM | POA: Insufficient documentation

## 2023-10-14 DIAGNOSIS — N1832 Chronic kidney disease, stage 3b: Secondary | ICD-10-CM | POA: Diagnosis present

## 2023-10-14 DIAGNOSIS — Z8546 Personal history of malignant neoplasm of prostate: Secondary | ICD-10-CM | POA: Diagnosis not present

## 2023-10-14 DIAGNOSIS — Z905 Acquired absence of kidney: Secondary | ICD-10-CM | POA: Insufficient documentation

## 2023-10-14 DIAGNOSIS — Z85528 Personal history of other malignant neoplasm of kidney: Secondary | ICD-10-CM | POA: Diagnosis not present

## 2023-10-14 DIAGNOSIS — D72819 Decreased white blood cell count, unspecified: Secondary | ICD-10-CM | POA: Diagnosis not present

## 2023-10-14 LAB — HEMOGLOBIN AND HEMATOCRIT, BLOOD
HCT: 25.2 % — ABNORMAL LOW (ref 39.0–52.0)
Hemoglobin: 7.9 g/dL — ABNORMAL LOW (ref 13.0–17.0)

## 2023-10-14 MED ORDER — EPOETIN ALFA-EPBX 10000 UNIT/ML IJ SOLN
20000.0000 [IU] | INTRAMUSCULAR | Status: DC
Start: 2023-10-14 — End: 2023-10-14
  Administered 2023-10-14: 20000 [IU] via SUBCUTANEOUS
  Filled 2023-10-14: qty 2

## 2023-10-14 NOTE — Assessment & Plan Note (Addendum)
 Lab Results  Component Value Date   MPROTEIN Not Observed 09/21/2023   KPAFRELGTCHN 245.3 (H) 09/21/2023   LAMBDASER 90.5 (H) 09/21/2023   KAPLAMBRATIO 2.71 (H) 09/21/2023    Light chain MGUS  Observation. Obtain 24 hour UPEP

## 2023-10-14 NOTE — Telephone Encounter (Signed)
 Last Fill: 08/06/2023  Next Visit: 10/27/2023  Last Visit: 08/06/2023  Dx: Rheumatoid arthritis involving multiple sites with positive rheumatoid factor   Current Dose per office note on 08/06/2023: A prescription for prednisone starting at 10 mg tapering by 2.5 mg every 2 weeks was sent to the pharmacy until he reaches 5 mg daily which he will continue long-term for now.   Okay to refill Prednisone?

## 2023-10-14 NOTE — Telephone Encounter (Signed)
 Patient came in for retacrit today with a hgb of 7.9. Patient received 20,000 unites of retacrit as ordered in supportive therapy plan. I also sent secure chat to team to inform of 7.9 hgb just incase he needed blood. Per Dr. Cathie Hoops responded wanting to increase dose to 40,000 but patient had already received his injection and left. I have attempted to call patient 3x to advise to come in for additional 20,000 units of retacrit tomorrow, 10/15/2023. No answer. Will send MyChart message as well.

## 2023-10-15 ENCOUNTER — Other Ambulatory Visit: Payer: Self-pay

## 2023-10-15 DIAGNOSIS — D631 Anemia in chronic kidney disease: Secondary | ICD-10-CM

## 2023-10-15 LAB — PSA: Prostatic Specific Antigen: 0.02 ng/mL (ref 0.00–4.00)

## 2023-10-15 NOTE — Telephone Encounter (Signed)
 Called pt x2 this morning, no answer.

## 2023-10-18 ENCOUNTER — Other Ambulatory Visit: Payer: Self-pay | Admitting: Oncology

## 2023-10-18 NOTE — Telephone Encounter (Signed)
 Ash, will you add retacrit (20,000u) on 3/12 please and inform wife.

## 2023-10-19 ENCOUNTER — Other Ambulatory Visit (HOSPITAL_COMMUNITY): Payer: Self-pay

## 2023-10-20 ENCOUNTER — Encounter: Payer: Self-pay | Admitting: Oncology

## 2023-10-20 ENCOUNTER — Encounter: Payer: Self-pay | Admitting: Radiation Oncology

## 2023-10-20 ENCOUNTER — Ambulatory Visit
Admission: RE | Admit: 2023-10-20 | Discharge: 2023-10-20 | Disposition: A | Discharge status: Home / Self Care | Payer: Medicare PPO | Source: Ambulatory Visit | Attending: Radiation Oncology | Admitting: Radiation Oncology

## 2023-10-20 ENCOUNTER — Inpatient Hospital Stay

## 2023-10-20 ENCOUNTER — Other Ambulatory Visit: Payer: Self-pay | Admitting: *Deleted

## 2023-10-20 VITALS — BP 140/74

## 2023-10-20 VITALS — BP 144/73 | HR 74 | Temp 97.1°F | Resp 20 | Wt 206.0 lb

## 2023-10-20 DIAGNOSIS — R351 Nocturia: Secondary | ICD-10-CM | POA: Insufficient documentation

## 2023-10-20 DIAGNOSIS — Z191 Hormone sensitive malignancy status: Secondary | ICD-10-CM | POA: Diagnosis not present

## 2023-10-20 DIAGNOSIS — Z923 Personal history of irradiation: Secondary | ICD-10-CM | POA: Insufficient documentation

## 2023-10-20 DIAGNOSIS — C61 Malignant neoplasm of prostate: Secondary | ICD-10-CM | POA: Diagnosis not present

## 2023-10-20 DIAGNOSIS — D631 Anemia in chronic kidney disease: Secondary | ICD-10-CM

## 2023-10-20 DIAGNOSIS — N1832 Chronic kidney disease, stage 3b: Secondary | ICD-10-CM | POA: Diagnosis not present

## 2023-10-20 MED ORDER — EPOETIN ALFA-EPBX 10000 UNIT/ML IJ SOLN
20000.0000 [IU] | INTRAMUSCULAR | Status: DC
  Administered 2023-10-20: 20000 [IU] via SUBCUTANEOUS
  Filled 2023-10-20 (×3): qty 2

## 2023-10-20 NOTE — Progress Notes (Signed)
 Radiation Oncology Follow up Note  Name: Michael Hinton.   Date:   10/20/2023 MRN:  [id number] DOB: 1950-09-26    This 73 y.o. male presents to the clinic today for 6-month follow-up status post image guided IMRT radiation therapy for stage IIc Gleason 7 (4+3) adenocarcinoma the prostate.  REFERRING PROVIDER: Marisue Ivan, NP  HPI: Patient is a 73 year old male now out for months having completed image guided IMRT radiation therapy for Gleason 7 adenocarcinoma the prostate seen today in follow-up he is doing well.  He specifically [place] any increased lower urinary tract symptoms diarrhea or fatigue.  He is having nocturia x 3-4 which she states is always been normal for him in his adult life.  Most recent PSA is excellent at 0.02.  COMPLICATIONS OF TREATMENT: none  FOLLOW UP COMPLIANCE: keeps appointments   PHYSICAL EXAM:  BP (!) 144/73 Comment: Patient forgot to take BP  Pulse 74   Temp (!) 97.1 F (36.2 C)   Resp 20   Wt 206 lb (93.4 kg)   BMI 28.73 kg/m  Well-developed well-nourished patient in NAD. HEENT reveals PERLA, EOMI, discs not visualized.  Oral cavity is clear. No oral mucosal lesions are identified. Neck is clear without evidence of cervical or supraclavicular adenopathy. Lungs are clear to A&P. Cardiac examination is essentially unremarkable with regular rate and rhythm without murmur rub or thrill. Abdomen is benign with no organomegaly or masses noted. Motor sensory and DTR levels are equal and symmetric in the upper and lower extremities. Cranial nerves II through XII are grossly intact. Proprioception is intact. No peripheral adenopathy or edema is identified. No motor or sensory levels are noted. Crude visual fields are within normal range.  RADIOLOGY RESULTS: No current films for review  PLAN: Present time patient is doing well under excellent biochemical control of his prostate cancer.  Of asked to see him back in 6 months for follow-up with repeat PSA.  Patient  knows to call with any concerns.  I would like to take this opportunity to thank you for allowing me to participate in the care of your patient.Carmina Miller, MD

## 2023-10-21 ENCOUNTER — Other Ambulatory Visit: Payer: Self-pay | Admitting: Oncology

## 2023-10-21 ENCOUNTER — Ambulatory Visit: Payer: Medicare PPO | Admitting: Physician Assistant

## 2023-10-21 ENCOUNTER — Encounter: Payer: Self-pay | Admitting: Oncology

## 2023-10-21 DIAGNOSIS — M24561 Contracture, right knee: Secondary | ICD-10-CM

## 2023-10-21 DIAGNOSIS — D3002 Benign neoplasm of left kidney: Secondary | ICD-10-CM

## 2023-10-21 DIAGNOSIS — C61 Malignant neoplasm of prostate: Secondary | ICD-10-CM

## 2023-10-21 DIAGNOSIS — M24521 Contracture, right elbow: Secondary | ICD-10-CM

## 2023-10-21 DIAGNOSIS — Z79899 Other long term (current) drug therapy: Secondary | ICD-10-CM

## 2023-10-21 DIAGNOSIS — Z7952 Long term (current) use of systemic steroids: Secondary | ICD-10-CM

## 2023-10-21 DIAGNOSIS — M48061 Spinal stenosis, lumbar region without neurogenic claudication: Secondary | ICD-10-CM

## 2023-10-21 DIAGNOSIS — I10 Essential (primary) hypertension: Secondary | ICD-10-CM

## 2023-10-21 DIAGNOSIS — M24522 Contracture, left elbow: Secondary | ICD-10-CM

## 2023-10-21 DIAGNOSIS — R768 Other specified abnormal immunological findings in serum: Secondary | ICD-10-CM

## 2023-10-21 DIAGNOSIS — D702 Other drug-induced agranulocytosis: Secondary | ICD-10-CM

## 2023-10-21 DIAGNOSIS — M0579 Rheumatoid arthritis with rheumatoid factor of multiple sites without organ or systems involvement: Secondary | ICD-10-CM

## 2023-10-21 DIAGNOSIS — E559 Vitamin D deficiency, unspecified: Secondary | ICD-10-CM

## 2023-10-22 ENCOUNTER — Other Ambulatory Visit (HOSPITAL_COMMUNITY): Payer: Self-pay

## 2023-10-22 ENCOUNTER — Other Ambulatory Visit: Payer: Medicare HMO

## 2023-10-25 ENCOUNTER — Other Ambulatory Visit: Payer: Self-pay

## 2023-10-25 ENCOUNTER — Encounter: Payer: Self-pay | Admitting: Oncology

## 2023-10-26 ENCOUNTER — Ambulatory Visit: Payer: Medicare HMO | Admitting: Urology

## 2023-10-27 ENCOUNTER — Other Ambulatory Visit: Payer: Self-pay

## 2023-10-27 ENCOUNTER — Encounter: Payer: Self-pay | Admitting: Physician Assistant

## 2023-10-27 ENCOUNTER — Ambulatory Visit: Payer: Medicare PPO | Attending: Physician Assistant | Admitting: Physician Assistant

## 2023-10-27 VITALS — BP 137/71 | HR 74 | Ht 71.0 in | Wt 205.0 lb

## 2023-10-27 DIAGNOSIS — Z79899 Other long term (current) drug therapy: Secondary | ICD-10-CM

## 2023-10-27 DIAGNOSIS — M48061 Spinal stenosis, lumbar region without neurogenic claudication: Secondary | ICD-10-CM

## 2023-10-27 DIAGNOSIS — C61 Malignant neoplasm of prostate: Secondary | ICD-10-CM | POA: Diagnosis not present

## 2023-10-27 DIAGNOSIS — M24521 Contracture, right elbow: Secondary | ICD-10-CM

## 2023-10-27 DIAGNOSIS — D702 Other drug-induced agranulocytosis: Secondary | ICD-10-CM

## 2023-10-27 DIAGNOSIS — R768 Other specified abnormal immunological findings in serum: Secondary | ICD-10-CM

## 2023-10-27 DIAGNOSIS — M0579 Rheumatoid arthritis with rheumatoid factor of multiple sites without organ or systems involvement: Secondary | ICD-10-CM

## 2023-10-27 DIAGNOSIS — M24522 Contracture, left elbow: Secondary | ICD-10-CM | POA: Diagnosis not present

## 2023-10-27 DIAGNOSIS — D3002 Benign neoplasm of left kidney: Secondary | ICD-10-CM

## 2023-10-27 DIAGNOSIS — I10 Essential (primary) hypertension: Secondary | ICD-10-CM

## 2023-10-27 DIAGNOSIS — Z7952 Long term (current) use of systemic steroids: Secondary | ICD-10-CM

## 2023-10-27 DIAGNOSIS — M24561 Contracture, right knee: Secondary | ICD-10-CM

## 2023-10-27 DIAGNOSIS — E559 Vitamin D deficiency, unspecified: Secondary | ICD-10-CM | POA: Diagnosis not present

## 2023-10-27 DIAGNOSIS — M24562 Contracture, left knee: Secondary | ICD-10-CM

## 2023-10-28 ENCOUNTER — Inpatient Hospital Stay

## 2023-10-28 VITALS — BP 130/74

## 2023-10-28 DIAGNOSIS — D631 Anemia in chronic kidney disease: Secondary | ICD-10-CM

## 2023-10-28 DIAGNOSIS — N1832 Chronic kidney disease, stage 3b: Secondary | ICD-10-CM | POA: Diagnosis not present

## 2023-10-28 LAB — CBC WITH DIFFERENTIAL (CANCER CENTER ONLY)
Abs Immature Granulocytes: 0.07 10*3/uL (ref 0.00–0.07)
Basophils Absolute: 0 10*3/uL (ref 0.0–0.1)
Basophils Relative: 0 %
Eosinophils Absolute: 0.1 10*3/uL (ref 0.0–0.5)
Eosinophils Relative: 3 %
HCT: 28.9 % — ABNORMAL LOW (ref 39.0–52.0)
Hemoglobin: 8.9 g/dL — ABNORMAL LOW (ref 13.0–17.0)
Immature Granulocytes: 1 %
Lymphocytes Relative: 11 %
Lymphs Abs: 0.6 10*3/uL — ABNORMAL LOW (ref 0.7–4.0)
MCH: 28.5 pg (ref 26.0–34.0)
MCHC: 30.8 g/dL (ref 30.0–36.0)
MCV: 92.6 fL (ref 80.0–100.0)
Monocytes Absolute: 0.4 10*3/uL (ref 0.1–1.0)
Monocytes Relative: 7 %
Neutro Abs: 3.9 10*3/uL (ref 1.7–7.7)
Neutrophils Relative %: 78 %
Platelet Count: 180 10*3/uL (ref 150–400)
RBC: 3.12 MIL/uL — ABNORMAL LOW (ref 4.22–5.81)
RDW: 17.1 % — ABNORMAL HIGH (ref 11.5–15.5)
WBC Count: 5.1 10*3/uL (ref 4.0–10.5)
nRBC: 0 % (ref 0.0–0.2)

## 2023-10-28 LAB — SAMPLE TO BLOOD BANK

## 2023-10-28 MED ORDER — LOSARTAN POTASSIUM 50 MG PO TABS
50.0000 mg | ORAL_TABLET | Freq: Every day | ORAL | 1 refills | Status: DC
Start: 2023-10-28 — End: 2023-12-20

## 2023-10-28 MED ORDER — DARBEPOETIN ALFA 150 MCG/0.3ML IJ SOSY
150.0000 ug | PREFILLED_SYRINGE | Freq: Once | INTRAMUSCULAR | Status: AC
  Administered 2023-10-28: 150 ug via SUBCUTANEOUS
  Filled 2023-10-28: qty 0.3

## 2023-10-28 MED ORDER — FOLIC ACID 1 MG PO TABS: 1.0000 mg | ORAL_TABLET | Freq: Every day | ORAL | 1 refills | Status: DC

## 2023-10-28 MED ORDER — MIRTAZAPINE 7.5 MG PO TABS: 7.5000 mg | ORAL_TABLET | Freq: Every day | ORAL | 3 refills | Status: AC

## 2023-10-28 MED ORDER — ROSUVASTATIN CALCIUM 40 MG PO TABS
40.0000 mg | ORAL_TABLET | Freq: Every day | ORAL | 1 refills | Status: DC
Start: 2023-10-28 — End: 2023-12-30

## 2023-11-01 ENCOUNTER — Telehealth: Payer: Self-pay | Admitting: Pharmacist

## 2023-11-01 NOTE — Telephone Encounter (Signed)
 I called the patient and spoke with his wife Misty Stanley.  Discussed that Dr. Cathie Hoops has cleared him to resume Orencia from a hematology standpoint.  Discussed that he will require close lab monitoring at least on a monthly basis.   Plan to space Orencia to 125 mg sq injections every 14 days.  All questions were addressed.  He has orencia at home in the fridge-they will notify us when he needs a refill.  He will remain on prednisone until his next follow up visit.    Sherron Ales, PA-C

## 2023-11-01 NOTE — Telephone Encounter (Signed)
 Patient has been cleared by rad onc and hematology to move forward with restarting Orencia per Sherron Ales, PA-C  Dosing will be Orencia SQ 125mg  every 14 days (SPACING DUE TO CBC). Patient has Orencia doses at home from Jan 2025.  Recheck CBC w diff in 1 month  Chesley Mires, PharmD, MPH, BCPS, CPP Clinical Pharmacist (Rheumatology and Pulmonology)

## 2023-11-02 ENCOUNTER — Other Ambulatory Visit: Payer: Self-pay

## 2023-11-02 DIAGNOSIS — D631 Anemia in chronic kidney disease: Secondary | ICD-10-CM

## 2023-11-02 DIAGNOSIS — N1832 Chronic kidney disease, stage 3b: Secondary | ICD-10-CM | POA: Diagnosis not present

## 2023-11-02 LAB — HEMOCHROMATOSIS DNA-PCR(C282Y,H63D)

## 2023-11-04 ENCOUNTER — Other Ambulatory Visit: Payer: Self-pay | Admitting: *Deleted

## 2023-11-04 ENCOUNTER — Encounter: Payer: Self-pay | Admitting: Oncology

## 2023-11-04 MED ORDER — TAMSULOSIN HCL 0.4 MG PO CAPS: 0.4000 mg | ORAL_CAPSULE | Freq: Every day | ORAL | 4 refills | Status: DC

## 2023-11-05 ENCOUNTER — Ambulatory Visit: Payer: Medicare PPO

## 2023-11-05 ENCOUNTER — Other Ambulatory Visit: Payer: Medicare PPO

## 2023-11-05 ENCOUNTER — Ambulatory Visit: Payer: Medicare PPO | Admitting: Oncology

## 2023-11-05 LAB — IFE+PROTEIN ELECTRO, 24-HR UR
% BETA, Urine: 22.6 %
ALPHA 1 URINE: 2.7 %
Albumin, U: 26.9 %
Alpha 2, Urine: 12.2 %
GAMMA GLOBULIN URINE: 35.6 %
M-SPIKE %, Urine: 12.9 % — ABNORMAL HIGH
M-Spike, Mg/24 Hr: 77 mg/(24.h) — ABNORMAL HIGH
Total Protein, Urine-Ur/day: 593 mg/(24.h) — ABNORMAL HIGH (ref 30–150)
Total Protein, Urine: 34.9 mg/dL
Total Volume: 1700

## 2023-11-10 ENCOUNTER — Other Ambulatory Visit: Payer: Self-pay | Admitting: *Deleted

## 2023-11-10 ENCOUNTER — Other Ambulatory Visit: Payer: Self-pay | Admitting: Cardiology

## 2023-11-10 ENCOUNTER — Ambulatory Visit: Payer: Medicare PPO | Admitting: Physician Assistant

## 2023-11-10 DIAGNOSIS — D631 Anemia in chronic kidney disease: Secondary | ICD-10-CM

## 2023-11-11 ENCOUNTER — Encounter: Payer: Self-pay | Admitting: Oncology

## 2023-11-11 ENCOUNTER — Inpatient Hospital Stay

## 2023-11-11 ENCOUNTER — Inpatient Hospital Stay: Attending: Oncology

## 2023-11-11 ENCOUNTER — Inpatient Hospital Stay (HOSPITAL_BASED_OUTPATIENT_CLINIC_OR_DEPARTMENT_OTHER): Admitting: Oncology

## 2023-11-11 VITALS — BP 136/80 | HR 65 | Temp 96.0°F | Resp 18 | Wt 202.1 lb

## 2023-11-11 DIAGNOSIS — Z85528 Personal history of other malignant neoplasm of kidney: Secondary | ICD-10-CM

## 2023-11-11 DIAGNOSIS — C61 Malignant neoplasm of prostate: Secondary | ICD-10-CM

## 2023-11-11 DIAGNOSIS — Z905 Acquired absence of kidney: Secondary | ICD-10-CM | POA: Insufficient documentation

## 2023-11-11 DIAGNOSIS — R7989 Other specified abnormal findings of blood chemistry: Secondary | ICD-10-CM

## 2023-11-11 DIAGNOSIS — Z923 Personal history of irradiation: Secondary | ICD-10-CM | POA: Insufficient documentation

## 2023-11-11 DIAGNOSIS — D631 Anemia in chronic kidney disease: Secondary | ICD-10-CM | POA: Diagnosis not present

## 2023-11-11 DIAGNOSIS — D72819 Decreased white blood cell count, unspecified: Secondary | ICD-10-CM | POA: Insufficient documentation

## 2023-11-11 DIAGNOSIS — M069 Rheumatoid arthritis, unspecified: Secondary | ICD-10-CM | POA: Diagnosis not present

## 2023-11-11 DIAGNOSIS — N1832 Chronic kidney disease, stage 3b: Secondary | ICD-10-CM | POA: Insufficient documentation

## 2023-11-11 DIAGNOSIS — Z8546 Personal history of malignant neoplasm of prostate: Secondary | ICD-10-CM | POA: Diagnosis not present

## 2023-11-11 DIAGNOSIS — D472 Monoclonal gammopathy: Secondary | ICD-10-CM

## 2023-11-11 LAB — CBC WITH DIFFERENTIAL/PLATELET
Abs Immature Granulocytes: 0.06 10*3/uL (ref 0.00–0.07)
Basophils Absolute: 0 10*3/uL (ref 0.0–0.1)
Basophils Relative: 0 %
Eosinophils Absolute: 0.1 10*3/uL (ref 0.0–0.5)
Eosinophils Relative: 4 %
HCT: 31.8 % — ABNORMAL LOW (ref 39.0–52.0)
Hemoglobin: 9.7 g/dL — ABNORMAL LOW (ref 13.0–17.0)
Immature Granulocytes: 2 %
Lymphocytes Relative: 19 %
Lymphs Abs: 0.7 10*3/uL (ref 0.7–4.0)
MCH: 27.6 pg (ref 26.0–34.0)
MCHC: 30.5 g/dL (ref 30.0–36.0)
MCV: 90.3 fL (ref 80.0–100.0)
Monocytes Absolute: 0.4 10*3/uL (ref 0.1–1.0)
Monocytes Relative: 10 %
Neutro Abs: 2.5 10*3/uL (ref 1.7–7.7)
Neutrophils Relative %: 65 %
Platelets: 175 10*3/uL (ref 150–400)
RBC: 3.52 MIL/uL — ABNORMAL LOW (ref 4.22–5.81)
RDW: 16.1 % — ABNORMAL HIGH (ref 11.5–15.5)
WBC: 3.8 10*3/uL — ABNORMAL LOW (ref 4.0–10.5)
nRBC: 0 % (ref 0.0–0.2)

## 2023-11-11 LAB — SAMPLE TO BLOOD BANK

## 2023-11-11 MED ORDER — DARBEPOETIN ALFA 150 MCG/0.3ML IJ SOSY
150.0000 ug | PREFILLED_SYRINGE | Freq: Once | INTRAMUSCULAR | Status: AC
  Administered 2023-11-11: 150 ug via SUBCUTANEOUS
  Filled 2023-11-11: qty 0.3

## 2023-11-11 NOTE — Progress Notes (Signed)
 Hematology/Oncology Progress note [phone] [phone]            Patient Care Team: Marisue Ivan, NP as PCP - General (Cardiology) Rickard Patience, MD as Consulting Physician (Hematology and Oncology)  ASSESSMENT & PLAN:   Anemia in chronic kidney disease (CKD) Chronic anemia, likely secondary to chronic kidney disease.  Recent drop of hemoglobin possibly secondary to radiation. Check CBC, smear, iron, TIBC ferritin, protein electrophoresis, reticulocyte panel, LDH and haptoglobin.  Lab Results  Component Value Date   HGB 9.7 (L) 11/11/2023   TIBC 239 (L) 09/21/2023   IRONPCTSAT 35 09/21/2023   FERRITIN 1,626 (H) 09/21/2023    Iron panel is not consistent with iron deficiency.  Elevated ferritin, possibly secondary to chronic inflammation. Hemoglobin is less than 10, recommend erythropoietin replacement therapy. Recommend Aranesp 150 mg x1 Monitor hemoglobin every 4 weeks +/- Aranesp  Elevated ferritin Possibly due to chronic inflammation due to autoimmune disease.   Negative hemochromatosis mutation PCR.  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   Prostate cancer (HCC) Status post IMRT and 6 months of adjuvant deprivation therapy. Follow-up with urology and radiation oncology   Orders Placed This Encounter  Procedures   Hemoglobin and hematocrit, blood    Standing Status:   Future    Expected Date:   01/06/2024    Expiration Date:   11/10/2024   Hemoglobin and hematocrit, blood    Standing Status:   Future    Expected Date:   12/09/2023    Expiration Date:   11/10/2024   CBC with Differential (Cancer Center Only)    Standing Status:   Future    Expected Date:   02/10/2024    Expiration Date:    11/10/2024   CMP (Cancer Center only)    Standing Status:   Future    Expected Date:   02/10/2024    Expiration Date:   11/10/2024   Multiple Myeloma Panel (SPEP&IFE w/QIG)    Standing Status:   Future    Expected Date:   02/10/2024    Expiration Date:   11/10/2024   Follow-up per LOS All questions were answered. The patient knows to call the clinic with any problems, questions or concerns.  Rickard Patience, MD, PhD [facility] Hematology Oncology 11/11/2023   CHIEF COMPLAINTS/REASON FOR VISIT:  Anemia, clearance to resume  Orencia  HISTORY OF PRESENTING ILLNESS:   Divit Stipp. is a  73 y.o.  male with PMH listed below was seen in consultation at the request of  Scoggins, Amber, NP  for evaluation of anemia and leukopenia.  Chronic leukopenia, baseline of total white count is around 3. Patient has history of rheumatoid arthritis.  Previously on Enbrel.  Recently he has been switched to Humira and status post 1 dose.  He is currently taking prednisone for side effects from Humira.  He has a history of low white blood cell count, anemia, and B12 deficiency. He was hospitalized in September 2023 for weight loss, chronic joint pain, and acute kidney injury, during which he received two blood transfusions. His iron levels were checked in January 2025, showing high levels, possibly influenced by rheumatoid arthritis. He has not previously received IV iron infusions and is currently taking iron pills.  He has a history of kidney cancer, for which he underwent nephrectomy on 07/13/2022. Pathology showed pallilary renal cell carcinoma. 07/18/2023  CT scan showed no evidence of cancer recurrence, Sclerotic focus in the right L4 vertebral body, possibly a bone island, can not rule out metastatic disease.   He was diagnosed with prostate cancer in May 2024 [Gleason 3+4 as well as 4+3 lesion at the left lateral base and left lateral mid up to 37% of the tissue]. completed radiation therapy in November 2024. He  received 6 months of ADT is currently taking Flomax.   He has a history of rheumatoid arthritis and was previously on Humira and Enbrel. He was on Orencia, which was held while he was on Radiation.   He has a chronic history of anemia,  08/24/2023 - 04/28/2024 patient was hospitalized due to acute on chronic kidney failure, acute on chronic anemia.  Hemoglobin was 6.7 on admission status post PRBC transfusion during hospitalization.   INTERVAL HISTORY Kenyatta Keidel. is a 73 y.o. male who has above history reviewed by me today presents  to re-establish care for anemia due to CKD. Patient tolerated erythropoietin replacement therapy.  Hemoglobin has improved.  Review of Systems  Constitutional:  Positive for fatigue. Negative for appetite change, chills, fever and unexpected weight change.  HENT:   Negative for hearing loss and voice change.   Eyes:  Negative for eye problems and icterus.  Respiratory:  Negative for chest tightness, cough and shortness of breath.   Cardiovascular:  Negative for chest pain and leg swelling.  Gastrointestinal:  Negative for abdominal distention and abdominal pain.  Endocrine: Negative for hot flashes.  Genitourinary:  Negative for difficulty urinating, dysuria and frequency.   Musculoskeletal:  Positive for arthralgias.       Chronic joint deformity  Skin:  Negative for itching and rash.  Neurological:  Negative for light-headedness and numbness.  Hematological:  Negative for adenopathy. Does not bruise/bleed easily.  Psychiatric/Behavioral:  Negative for confusion.     MEDICAL HISTORY:  Past Medical History:  Diagnosis Date   Anemia    Aortic atherosclerosis (HCC)    Atrial fibrillation (HCC)    a.) CHA2DS2VASc = 5 (age, HTN, CVA x 2, vascular disease history);  b.) rate/rhythm maintained without pharmacological intervention; chronically anticoagulated with clopidogrel   Bilateral renal cysts 05/29/2022   Carotid artery disease (HCC) 04/11/2022    a.) doppler 04/11/2022: <50 LICA, no sig RICA.   CVA (cerebral vascular accident) [facility])    a.) MRI brain 01/14/2022: numerous chronic cerebellar infarcts; b.) CT head 04/10/2022: small old lacunar infarcts are seen in cerebellum bilaterally   Diastolic dysfunction 04/11/2022   a.) TTE 04/11/2022: EF 55-60%, mild BAE, mild MR, G1DD.   HLD (hyperlipidemia)    Hypertension 06/02/2016   Long term current use of antithrombotics/antiplatelets    a.) clopidogrel   Long term current use of systemic steroids    a.) prednisone for diagnosis of RA   Neutropenia (HCC)    Prostate cancer (HCC)    Renal cell cancer, left (HCC) 04/10/2022   a.) renal US 04/10/2022: solid heterogenous LEFT renal mass measuring 5.2 cm; b.) MRI ABD - 5.3 x 4.3 cm renal mass to posterior lip of LEFT kidney extending into renal sinus; c.)  renal Bx  05/06/2022 - (+) for RCC with clear cell features   Rheumatoid arthritis involving multiple sites with positive rheumatoid factor (HCC) 06/02/2016   T2DM (type 2 diabetes mellitus) (HCC)  Thoracic ascending aortic aneurysm (HCC) 06/01/2022   a.) CT chest - measured 4.2 cm    SURGICAL HISTORY: Past Surgical History:  Procedure Laterality Date   LAPAROSCOPIC NEPHRECTOMY, HAND ASSISTED Left 07/13/2022   Procedure: HAND ASSISTED LAPAROSCOPIC RADICAL NEPHRECTOMY;  Surgeon: Vanna Scotland, MD;  Location: [facility] ORS;  Service: Urology;  Laterality: Left;   RENAL BIOPSY  05/2022    SOCIAL HISTORY: Social History   Socioeconomic History   Marital status: Married    Spouse name: Felecia   Number of children: Not on file   Years of education: Not on file   Highest education level: Not on file  Occupational History   Not on file  Tobacco Use   Smoking status: Former    Current packs/day: 0.00    Average packs/day: 0.5 packs/day for 7.0 years (3.5 ttl pk-yrs)    Types: Cigarettes    Start date: 03/16/1971    Quit date: 03/15/1978    Years since quitting: 45.6    Passive  exposure: Past   Smokeless tobacco: Never  Vaping Use   Vaping status: Never Used  Substance and Sexual Activity   Alcohol use: Not Currently   Drug use: No   Sexual activity: Yes  Other Topics Concern   Not on file  Social History Narrative   Not on file   Social Drivers of Health   Financial Resource Strain: Not on file  Food Insecurity: No Food Insecurity (08/24/2023)   Hunger Vital Sign    Worried About Running Out of Food in the Last Year: Never true    Ran Out of Food in the Last Year: Never true  Transportation Needs: No Transportation Needs (08/24/2023)   PRAPARE - Administrator, Civil Service (Medical): No    Lack of Transportation (Non-Medical): No  Physical Activity: Not on file  Stress: Not on file  Social Connections: Socially Integrated (08/24/2023)   Social Connection and Isolation Panel [NHANES]    Frequency of Communication with Friends and Family: More than three times a week    Frequency of Social Gatherings with Friends and Family: More than three times a week    Attends Religious Services: More than 4 times per year    Active Member of [employer] or Organizations: Yes    Attends Engineer, structural: More than 4 times per year    Marital Status: Married  Catering manager Violence: Not At Risk (08/24/2023)   Humiliation, Afraid, Rape, and Kick questionnaire    Fear of Current or Ex-Partner: No    Emotionally Abused: No    Physically Abused: No    Sexually Abused: No    FAMILY HISTORY: Family History  Problem Relation Age of Onset   Heart disease Mother    Diabetes Mother    Breast cancer Mother    Kidney disease Father    Diabetes Father    Diabetes Daughter     ALLERGIES:  has no known allergies.  MEDICATIONS:  Current Outpatient Medications  Medication Sig Dispense Refill   amLODipine (NORVASC) 10 MG tablet      Cholecalciferol (VITAMIN D-3) 125 MCG (5000 UT) TABS Take 1 tablet by mouth daily. 30 tablet 4   Ferrous Sulfate  (IRON PO) Take 1 tablet by mouth daily.     folic acid (FOLVITE) 1 MG tablet Take 1 tablet (1 mg total) by mouth daily. 90 tablet 1   losartan (COZAAR) 50 MG tablet Take 1 tablet (50 mg total) by mouth daily.  90 tablet 1   mirtazapine (REMERON) 7.5 MG tablet Take 1 tablet (7.5 mg total) by mouth at bedtime. 90 tablet 3   predniSONE (DELTASONE) 5 MG tablet Take 1 tablet (5 mg total) by mouth daily with breakfast. 30 tablet 2   rosuvastatin (CRESTOR) 40 MG tablet Take 1 tablet (40 mg total) by mouth daily. 90 tablet 1   tamsulosin (FLOMAX) 0.4 MG CAPS capsule Take 1 capsule (0.4 mg total) by mouth daily after supper. 90 capsule 4   triamcinolone ointment (KENALOG) 0.5 % Apply 1 Application topically 2 (two) times daily. 30 g 3   Abatacept (ORENCIA CLICKJECT) 125 MG/ML SOAJ Inject 125 mg into the skin every 14 (fourteen) days. (Patient not taking: Reported on 11/11/2023) 4 mL 0   No current facility-administered medications for this visit.     PHYSICAL EXAMINATION: ECOG PERFORMANCE STATUS: 1 - Symptomatic but completely ambulatory Vitals:   11/11/23 1103  BP: 136/80  Pulse: 65  Resp: 18  Temp: (!) 96 F (35.6 C)  SpO2: 99%   Filed Weights   11/11/23 1103  Weight: 202 lb 1.6 oz (91.7 kg)    Physical Exam Constitutional:      General: He is not in acute distress. HENT:     Head: Normocephalic and atraumatic.  Eyes:     General: No scleral icterus. Cardiovascular:     Rate and Rhythm: Normal rate and regular rhythm.     Heart sounds: Normal heart sounds.  Pulmonary:     Effort: Pulmonary effort is normal. No respiratory distress.     Breath sounds: No wheezing.  Abdominal:     General: Bowel sounds are normal. There is no distension.     Palpations: Abdomen is soft.  Musculoskeletal:     Cervical back: Normal range of motion and neck supple.     Comments: Range of motion of right knee is limited due to RA. Chronic joint swelling and deformity  Skin:    General: Skin is warm  and dry.     Findings: No erythema or rash.  Neurological:     Mental Status: He is alert and oriented to person, place, and time. Mental status is at baseline.     Cranial Nerves: No cranial nerve deficit.     Coordination: Coordination normal.  Psychiatric:        Mood and Affect: Mood normal.     LABORATORY DATA:  I have reviewed the data as listed Lab Results  Component Value Date   WBC 3.8 (L) 11/11/2023   HGB 9.7 (L) 11/11/2023   HCT 31.8 (L) 11/11/2023   MCV 90.3 11/11/2023   PLT 175 11/11/2023   Recent Labs    08/06/23 1047 08/24/23 1117 08/25/23 0538 08/26/23 0517 08/27/23 0727 08/28/23 0530 08/29/23 0427 09/21/23 1133  NA 139 138   < > 141 143 138 138  --   K 3.8 4.4   < > 4.3 4.3 4.0 3.9  --   CL 108 108   < > 112* 110 103 100  --   CO2 18* 16*   < > 19* 23 25 29   --   GLUCOSE 83 87   < > 94 83 198* 152*  --   BUN 34* 93*   < > 72* 69* 61* 55*  --   CREATININE 2.65* 6.60*   < > 4.74* 4.36* 3.75* 3.47*  --   CALCIUM 9.4 9.4   < > 8.8* 9.0 8.3* 8.4*  --  GFRNONAA  --  8*   < > 13* 14* 17* 18*  --   PROT 7.3 7.8  --   --   --   --   --  7.1  ALBUMIN  --  3.5  --  2.9*  --   --   --  3.2*  AST 86* 36  --   --   --   --   --  32  ALT 46 27  --   --   --   --   --  32  ALKPHOS  --  68  --   --   --   --   --  71  BILITOT 0.7 0.7  --   --   --   --   --  0.5  BILIDIR  --   --   --   --   --   --   --  0.1  IBILI  --   --   --   --   --   --   --  0.4   < > = values in this interval not displayed.   Iron/TIBC/Ferritin/ %Sat    Component Value Date/Time   IRON 83 09/21/2023 1133   TIBC 239 (L) 09/21/2023 1133   FERRITIN 1,626 (H) 09/21/2023 1133   IRONPCTSAT 35 09/21/2023 1133   IRONPCTSAT 37 03/06/2022 1342      RADIOGRAPHIC STUDIES: I have personally reviewed the radiological images as listed and agreed with the findings in the report. No results found.

## 2023-11-11 NOTE — Assessment & Plan Note (Signed)
 Possibly due to chronic inflammation due to autoimmune disease.   Negative hemochromatosis mutation PCR.

## 2023-11-11 NOTE — Assessment & Plan Note (Addendum)
 Chronic anemia, likely secondary to chronic kidney disease.  Recent drop of hemoglobin possibly secondary to radiation. Check CBC, smear, iron, TIBC ferritin, protein electrophoresis, reticulocyte panel, LDH and haptoglobin.  Lab Results  Component Value Date   HGB 9.7 (L) 11/11/2023   TIBC 239 (L) 09/21/2023   IRONPCTSAT 35 09/21/2023   FERRITIN 1,626 (H) 09/21/2023    Iron panel is not consistent with iron deficiency.  Elevated ferritin, possibly secondary to chronic inflammation. Hemoglobin is less than 10, recommend erythropoietin replacement therapy. Recommend Aranesp 150 mg x1 Monitor hemoglobin every 4 weeks +/- Aranesp

## 2023-11-11 NOTE — Assessment & Plan Note (Signed)
 Status post IMRT and 6 months of adjuvant deprivation therapy. Follow-up with urology and radiation oncology

## 2023-11-11 NOTE — Assessment & Plan Note (Signed)
 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

## 2023-11-17 ENCOUNTER — Other Ambulatory Visit: Payer: Self-pay | Admitting: Cardiology

## 2023-11-17 ENCOUNTER — Other Ambulatory Visit: Payer: Self-pay

## 2023-11-22 ENCOUNTER — Other Ambulatory Visit: Payer: Self-pay

## 2023-11-22 ENCOUNTER — Encounter: Payer: Self-pay | Admitting: Oncology

## 2023-11-22 ENCOUNTER — Other Ambulatory Visit: Payer: Self-pay | Admitting: Physician Assistant

## 2023-11-22 DIAGNOSIS — M0579 Rheumatoid arthritis with rheumatoid factor of multiple sites without organ or systems involvement: Secondary | ICD-10-CM

## 2023-11-22 DIAGNOSIS — Z79899 Other long term (current) drug therapy: Secondary | ICD-10-CM

## 2023-11-22 NOTE — Progress Notes (Signed)
 Specialty Pharmacy Refill Coordination Note  Michael Hinton. is a 73 y.o. male contacted today regarding refills of specialty medication(s) Abatacept (Orencia [employer]ect)   Patient requested Delivery   Delivery date: 01/06/24   Verified address: [address]   [place] [address]   Medication will be filled on 01/05/24.

## 2023-11-23 ENCOUNTER — Other Ambulatory Visit (HOSPITAL_COMMUNITY): Payer: Self-pay

## 2023-11-23 MED ORDER — ORENCIA CLICKJECT 125 MG/ML ~~LOC~~ SOAJ
125.0000 mg | SUBCUTANEOUS | 2 refills | Status: DC
  Filled 2023-11-23: qty 4, 30d supply, fill #0
  Filled 2024-02-24 – 2024-05-26 (×3): qty 4, 30d supply, fill #1
  Filled 2024-06-22 – 2024-07-18 (×3): qty 4, 30d supply, fill #2

## 2023-11-23 NOTE — Telephone Encounter (Signed)
 Last Fill: 08/27/2023  Labs: 11/11/2023 WBC 3.8, RBC 3.52, Hgb 9.7, Hct 31.8, RDW 16.1, 10/05/2023 BUN 30, Creat. 1.71, GFR 43  TB Skin Test 08/23/2023: Negative  Next Visit: 02/01/2024  Last Visit: 10/27/2023  [id number] arthritis involving multiple sites with positive rheumatoid factor   Current Dose per office note 10/27/2023: Orencia SQ 125mg  every 14 days (SPACING DUE TO CBC)   Okay to refill Orencia?

## 2023-11-26 ENCOUNTER — Other Ambulatory Visit: Payer: Self-pay

## 2023-12-09 ENCOUNTER — Inpatient Hospital Stay

## 2023-12-09 ENCOUNTER — Encounter: Payer: Self-pay | Admitting: Oncology

## 2023-12-09 ENCOUNTER — Inpatient Hospital Stay: Attending: Oncology

## 2023-12-09 DIAGNOSIS — Z8546 Personal history of malignant neoplasm of prostate: Secondary | ICD-10-CM | POA: Insufficient documentation

## 2023-12-09 DIAGNOSIS — D631 Anemia in chronic kidney disease: Secondary | ICD-10-CM | POA: Diagnosis present

## 2023-12-09 DIAGNOSIS — D72819 Decreased white blood cell count, unspecified: Secondary | ICD-10-CM | POA: Diagnosis not present

## 2023-12-09 DIAGNOSIS — R7989 Other specified abnormal findings of blood chemistry: Secondary | ICD-10-CM | POA: Insufficient documentation

## 2023-12-09 DIAGNOSIS — M069 Rheumatoid arthritis, unspecified: Secondary | ICD-10-CM | POA: Insufficient documentation

## 2023-12-09 DIAGNOSIS — Z905 Acquired absence of kidney: Secondary | ICD-10-CM | POA: Insufficient documentation

## 2023-12-09 DIAGNOSIS — Z923 Personal history of irradiation: Secondary | ICD-10-CM | POA: Insufficient documentation

## 2023-12-09 DIAGNOSIS — Z85528 Personal history of other malignant neoplasm of kidney: Secondary | ICD-10-CM | POA: Diagnosis not present

## 2023-12-09 DIAGNOSIS — N1832 Chronic kidney disease, stage 3b: Secondary | ICD-10-CM | POA: Diagnosis present

## 2023-12-09 LAB — HEMOGLOBIN AND HEMATOCRIT, BLOOD
HCT: 33.6 % — ABNORMAL LOW (ref 39.0–52.0)
Hemoglobin: 10.5 g/dL — ABNORMAL LOW (ref 13.0–17.0)

## 2023-12-09 NOTE — Progress Notes (Signed)
 Patient's hemoglobin is 10.5, per treatment plan he does not require Aranesp  injection today.

## 2023-12-11 ENCOUNTER — Other Ambulatory Visit: Payer: Self-pay | Admitting: Physician Assistant

## 2023-12-19 ENCOUNTER — Other Ambulatory Visit: Payer: Self-pay | Admitting: Cardiology

## 2023-12-19 DIAGNOSIS — I10 Essential (primary) hypertension: Secondary | ICD-10-CM

## 2023-12-23 ENCOUNTER — Encounter: Payer: Self-pay | Admitting: Oncology

## 2023-12-29 ENCOUNTER — Other Ambulatory Visit: Payer: Self-pay | Admitting: Cardiology

## 2023-12-30 ENCOUNTER — Ambulatory Visit (INDEPENDENT_AMBULATORY_CARE_PROVIDER_SITE_OTHER): Payer: Medicare PPO | Admitting: Cardiology

## 2023-12-30 ENCOUNTER — Encounter: Payer: Self-pay | Admitting: Cardiology

## 2023-12-30 ENCOUNTER — Encounter: Payer: Self-pay | Admitting: Oncology

## 2023-12-30 VITALS — BP 150/62 | HR 85 | Ht 71.0 in | Wt 202.0 lb

## 2023-12-30 DIAGNOSIS — E559 Vitamin D deficiency, unspecified: Secondary | ICD-10-CM

## 2023-12-30 DIAGNOSIS — E782 Mixed hyperlipidemia: Secondary | ICD-10-CM | POA: Diagnosis not present

## 2023-12-30 DIAGNOSIS — I10 Essential (primary) hypertension: Secondary | ICD-10-CM | POA: Diagnosis not present

## 2023-12-30 NOTE — Progress Notes (Signed)
 Established Patient Office Visit  Subjective:  Patient ID: Michael Hinton., male    DOB: August 04, 1951  Age: 73 y.o. MRN: [id number]  Chief Complaint  Patient presents with   Follow-up    6 Months Follow Up    Patient in office for 6  month follow up, did not have fasting lab work done. Patient doing well, no new complaints today.  Patient currently Status post IMRT and 6 months of adjuvant deprivation therapy, following with urology and radiation oncology.  Return for fasting lab work, will call with results. Continue same medications.     No other concerns at this time.   Past Medical History:  Diagnosis Date   Anemia    Aortic atherosclerosis (HCC)    Atrial fibrillation (HCC)    a.) CHA2DS2VASc = 5 (age, HTN, CVA x 2, vascular disease history);  b.) rate/rhythm maintained without pharmacological intervention; chronically anticoagulated with clopidogrel    Bilateral renal cysts 05/29/2022   Carotid artery disease (HCC) 04/11/2022   a.) doppler 04/11/2022: <50 LICA, no sig RICA.   CVA (cerebral vascular accident) [facility])    a.) MRI brain 01/14/2022: numerous chronic cerebellar infarcts; b.) CT head 04/10/2022: small old lacunar infarcts are seen in cerebellum bilaterally   Diastolic dysfunction 04/11/2022   a.) TTE 04/11/2022: EF 55-60%, mild BAE, mild MR, G1DD.   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   T2DM (type 2 diabetes mellitus) (HCC)    Thoracic ascending aortic aneurysm (HCC) 06/01/2022   a.) CT chest - measured 4.2 cm    Past Surgical History:  Procedure Laterality Date   LAPAROSCOPIC NEPHRECTOMY, HAND ASSISTED Left 07/13/2022   Procedure: HAND ASSISTED LAPAROSCOPIC RADICAL NEPHRECTOMY;  Surgeon: Dustin Gimenez, MD;  Location: [facility] ORS;  Service: Urology;  Laterality: Left;   RENAL BIOPSY  05/2022    Social History   Socioeconomic History   Marital status: Married    Spouse name: Felecia   Number of children: Not on file   Years of education: Not on file   Highest education level: Not on file  Occupational History   Not on file  Tobacco Use   Smoking status: Former    Current packs/day: 0.00    Average packs/day: 0.5 packs/day for 7.0 years (3.5 ttl pk-yrs)    Types: Cigarettes    Start date: 03/16/1971    Quit date: 03/15/1978    Years since quitting: 45.8    Passive exposure: Past   Smokeless tobacco: Never  Vaping Use   Vaping status: Never Used  Substance and Sexual Activity   Alcohol use: Not Currently   Drug use: No   Sexual activity: Yes  Other Topics Concern   Not on file  Social History Narrative   Not on file   Social Drivers of Health   Financial Resource Strain: Not on  file  Food Insecurity: No Food Insecurity (08/24/2023)   Hunger Vital Sign    Worried About Running Out of Food in the Last Year: Never true    Ran Out of Food in the Last Year: Never true  Transportation Needs: No Transportation Needs (08/24/2023)   PRAPARE - Administrator, Civil Service (Medical): No    Lack of Transportation (Non-Medical): No  Physical Activity: Not on file  Stress: Not on file  Social Connections: Socially Integrated (08/24/2023)   Social Connection and Isolation Panel [NHANES]    Frequency of Communication with Friends and Family: More than three times a week    Frequency of Social Gatherings with Friends and Family: More  than three times a week    Attends Religious Services: More than 4 times per year    Active Member of [employer] or Organizations: Yes    Attends Engineer, structural: More than 4 times per year    Marital Status: Married  Catering manager Violence: Not At Risk (08/24/2023)   Humiliation, Afraid, Rape, and Kick questionnaire    Fear of Current or Ex-Partner: No    Emotionally Abused: No    Physically Abused: No    Sexually Abused: No    Family History  Problem Relation Age of Onset   Heart disease Mother    Diabetes Mother    Breast cancer Mother    Kidney disease Father    Diabetes Father    Diabetes Daughter     No Known Allergies  Outpatient Medications Prior to Visit  Medication Sig   Abatacept  (ORENCIA  CLICKJECT) 125 MG/ML SOAJ Inject 125 mg into the skin every 14 (fourteen) days.   amLODipine  (NORVASC ) 10 MG tablet TAKE 1 TABLET BY MOUTH ONCE DAILY IN THE MORNING   Cholecalciferol (VITAMIN D -3) 125 MCG (5000 UT) TABS Take 1 tablet by mouth daily.   Ferrous Sulfate  (IRON  PO) Take 1 tablet by mouth daily.   folic acid  (FOLVITE ) 1 MG tablet Take 1 tablet (1 mg total) by mouth daily.   losartan  (COZAAR ) 50 MG tablet Take 1 tablet by mouth once daily   mirtazapine  (REMERON ) 7.5 MG tablet Take 1 tablet (7.5 mg total) by mouth at bedtime.   predniSONE  (DELTASONE ) 5 MG tablet Take 1 tablet (5 mg total) by mouth daily with breakfast.   rosuvastatin  (CRESTOR ) 40 MG tablet Take 1 tablet by mouth once daily   tamsulosin  (FLOMAX ) 0.4 MG CAPS capsule Take 1 capsule (0.4 mg total) by mouth daily after supper.   triamcinolone  ointment (KENALOG ) 0.5 % Apply 1 Application topically 2 (two) times daily.   No facility-administered medications prior to visit.    Review of Systems  Constitutional: Negative.   HENT: Negative.    Eyes: Negative.   Respiratory: Negative.  Negative for shortness of breath.   Cardiovascular: Negative.  Negative for chest pain.  Gastrointestinal:  Negative.  Negative for abdominal pain, constipation and diarrhea.  Genitourinary: Negative.   Musculoskeletal:  Negative for joint pain and myalgias.  Skin: Negative.   Neurological: Negative.  Negative for dizziness and headaches.  Endo/Heme/Allergies: Negative.   All other systems reviewed and are negative.      Objective:   BP (!) 150/62   Pulse 85   Ht 5\' 11"  (1.803 m)   Wt 202 lb (91.6 kg)   SpO2 98%   BMI 28.17 kg/m   Vitals:   12/30/23 1119  BP: (!) 150/62  Pulse: 85  Height: 5\' 11"  (  1.803 m)  Weight: 202 lb (91.6 kg)  SpO2: 98%  BMI (Calculated): 28.19    Physical Exam Nursing note reviewed.  Constitutional:      Appearance: Normal appearance. He is normal weight.  HENT:     Head: Normocephalic and atraumatic.     Nose: Nose normal.     Mouth/Throat:     Mouth: Mucous membranes are moist.     Pharynx: Oropharynx is clear.  Eyes:     Extraocular Movements: Extraocular movements intact.     Conjunctiva/sclera: Conjunctivae normal.     Pupils: Pupils are equal, round, and reactive to light.  Cardiovascular:     Rate and Rhythm: Normal rate and regular rhythm.     Pulses: Normal pulses.     Heart sounds: Normal heart sounds.  Pulmonary:     Effort: Pulmonary effort is normal.     Breath sounds: Normal breath sounds.  Abdominal:     General: Abdomen is flat. Bowel sounds are normal.     Palpations: Abdomen is soft.  Musculoskeletal:        General: Normal range of motion.     Cervical back: Normal range of motion.  Skin:    General: Skin is warm and dry.  Neurological:     General: No focal deficit present.     Mental Status: He is alert and oriented to person, place, and time.  Psychiatric:        Mood and Affect: Mood normal.        Behavior: Behavior normal.        Thought Content: Thought content normal.        Judgment: Judgment normal.      No results found for any visits on 12/30/23.  Recent Results (from the past 2160 hours)  PSA      Status: None   Collection Time: 10/14/23 11:04 AM  Result Value Ref Range   Prostatic Specific Antigen 0.02 0.00 - 4.00 ng/mL    Comment: (NOTE) While PSA levels of <=4.00 ng/ml are reported as reference range, some men with levels below 4.00 ng/ml can have prostate cancer and many men with PSA above 4.00 ng/ml do not have prostate cancer.  Other tests such as free PSA, age specific reference ranges, PSA velocity and PSA doubling time may be helpful especially in men less than 104 years old. Performed at [facility], [address] [place], [place] [address]   Hemoglobin and Hematocrit, Blood     Status: Abnormal   Collection Time: 10/14/23 11:04 AM  Result Value Ref Range   Hemoglobin 7.9 (L) 13.0 - 17.0 g/dL   HCT 60.4 (L) 54.0 - 98.1 %    Comment: Performed at [facility], [address] [place], [place] [address]  Sample to Blood Bank     Status: None   Collection Time: 10/28/23 11:29 AM  Result Value Ref Range   Blood Bank Specimen SAMPLE AVAILABLE FOR TESTING    Sample Expiration      10/31/2023,2359 Performed at [facility], [address] [place], [place] [address]   CBC with Differential (Cancer Center Only)     Status: Abnormal   Collection Time: 10/28/23 11:29 AM  Result Value Ref Range   WBC Count 5.1 4.0 - 10.5 K/uL   RBC 3.12 (L) 4.22 - 5.81 MIL/uL   Hemoglobin 8.9 (L) 13.0 - 17.0 g/dL   HCT 21.3 (L) 08.6 - 57.8 %   MCV 92.6 80.0 -  100.0 fL   MCH 28.5 26.0 - 34.0 pg   MCHC 30.8 30.0 - 36.0 g/dL   RDW 16.1 (H) 09.6 - 04.5 %   Platelet Count 180 150 - 400 K/uL   nRBC 0.0 0.0 - 0.2 %   Neutrophils Relative % 78 %   Neutro Abs 3.9 1.7 - 7.7 K/uL   Lymphocytes Relative 11 %   Lymphs Abs 0.6 (L) 0.7 - 4.0 K/uL   Monocytes Relative 7 %   Monocytes Absolute 0.4 0.1 - 1.0 K/uL   Eosinophils Relative 3 %   Eosinophils Absolute 0.1 0.0 - 0.5 K/uL   Basophils Relative 0 %   Basophils Absolute 0.0 0.0 - 0.1 K/uL    Immature Granulocytes 1 %   Abs Immature Granulocytes 0.07 0.00 - 0.07 K/uL    Comment: Performed at [facility], [address] [place], [place] [address]  Hemochromatosis DNA-PCR(c282y,h63d)     Status: None   Collection Time: 10/28/23 11:29 AM  Result Value Ref Range   DNA Mutation Analysis Comment     Comment: (NOTE) Result: c.845G>A (p.Cys282Tyr) - Not Detected c.187C>G (p.His63Asp) - Not Detected c.193A>T (p.Ser65Cys) - Not Detected Not associated with increased risk to develop clinical symptoms of Hereditary Hemochromatosis. In symptomatic individuals, other causes of iron  overload should be evaluated. See Additional Information and Comments. Additional Clinical Information: Hereditary hemochromatosis (HFE related) is an autosomal recessive iron  storage disorder. Patients may have a genetic diagnosis of hereditary hemochromatosis and never show clinical symptoms. Clinical symptoms typically appear between 40 to 60 years in males and after menopause in females. Signs and symptoms may include organ damage, primarily in the liver, risk for hepatocellular carcinoma, diabetes, and heart disease due to iron  accumulation. Life expectancy may be decreased in individuals who develop cirrhosis. Treatment for clinically symptomatic individuals may include therapeutic phlebotomy. L iver transplant may be used to treat end stage liver failure. For preventive care, monitoring for iron  overload is recommended for patients who are homozygous for c.845G>A (p.Cys282Tyr) and have yet to experience clinical symptoms. Comments: The most common HFE variants associated with hereditary hemochromatosis are c.845G>A (p.Cys282Tyr), c.187C>G (p.His63Asp), c.193A>T (p.Ser65Cys). While patients homozygous for c.845G>A (p.Cys282Tyr) are the most likely to present clinical symptoms, less than 10% develop clinically significant iron  overload with tissue and organ damage. Genetic counseling is  recommended to discuss the potential clinical implications of positive results, as well as recommendations for testing family members. Genetic Coordinators are available for health care providers to discuss results at [phone]). Test Details: Three variants analyzed: c.845G>A (p.Cys282Tyr), commonly referred to as C282Y c.187C>G (p.His63Asp), commonly referred to a s H63D c.193A>T (p.Ser65Cys), commonly referred to as S65C Methods/Limitations: DNA Analysis of the HFE gene ([id number]) was performed by PCR amplification followed by restriction enzyme digestion analyses. Results must be combined with clinical information for the most accurate interpretation. Molecular-based testing is highly accurate, but as in any laboratory test, diagnostic errors may occur. False positive or false negative results may occur for reasons that include genetic variants, blood transfusions, bone marrow transplantation, somatic or tissue-specific mosaicism, mislabeled samples, or erroneous representation of family relationships. This test was developed and its performance characteristics determined by [facility]. It has not been cleared or approved by the Food and Drug Administration. References: [facility], [address], [place], [place]; American Association for the Study of Liver Diseases. Diagnosis and management of hemochromatosis: 2011  practice guideline by the American Association for the Study of Liver Diseases. Hepatology.  2011 Jul;54(1):328-43. doi: 10.1002/hep.24330. PMID: 11914782; PMCID: NFA2130865. [address] Brissot P, Swinkels DW, Zoller H, Kamarainen O, Patton S, Alonso I, Morris M, Keeney S. EMQN best practice guidelines for the molecular genetic diagnosis of hereditary hemochromatosis [facility]). Eur J Hum Genet. 2016 Apr;24(4):479-95. doi: 10.1038/ejhg.2015.128. Epub 2015 Jul 8. PMID: 78469629; PMCID: BMW4132440.    Reviewed by: Comment     Comment: (NOTE) Technical  Component performed at [employer] [facility] Professional Component performed by: [employer] Boni Busman, Ph.D., [facility] Director, Molecular Genetics [address] [place] [address] Performed At: [employer] [facility] [address] [place], [place] [id number] Adams Adams MDPhD [id number]   Adel Holt, [place] [facility]     Status: Abnormal   Collection Time: 11/02/23 10:40 AM  Result Value Ref Range   Total Protein, Urine 34.9 Not Estab. mg/dL   Total Protein, Urine-Ur/day 593 (H) 30 - 150 mg/24 hr   Albumin, U 26.9 %   ALPHA 1 URINE 2.7 %   Alpha 2, Urine 12.2 %   % BETA, Urine 22.6 %   GAMMA GLOBULIN URINE 35.6 %   M-SPIKE %, Urine 12.9 (H) Not Observed %   M-Spike, Mg/24 Hr 77 (H) Not Observed mg/24 hr   Immunofixation Result, Urine Comment (A)     Comment: Bence Jones Protein positive; kappa type.   Note: Comment     Comment: (NOTE) Protein electrophoresis scan will follow via computer, mail, or courier delivery. Performed At: [facility] [address] [place], [place] [id number] Pearlean Botts MD CZ:[id number]    Total Volume 1,700     Comment: Performed at [facility], [address] [place], [place] [address]  Hold Tube- Blood Bank     Status: None   Collection Time: 11/11/23 10:33 AM  Result Value Ref Range   Blood Bank Specimen SAMPLE AVAILABLE FOR TESTING    Sample Expiration      11/14/2023,2359 Performed at [facility], [address] [place], [place] [address]   CBC with Differential/Platelet     Status: Abnormal   Collection Time: 11/11/23 10:33 AM  Result Value Ref Range   WBC 3.8 (L) 4.0 - 10.5 K/uL   RBC 3.52 (L) 4.22 - 5.81 MIL/uL   Hemoglobin 9.7 (L) 13.0 - 17.0 g/dL   HCT 20.2 (L) 54.2 - 70.6 %   MCV 90.3 80.0 - 100.0 fL   MCH 27.6 26.0 - 34.0 pg   MCHC 30.5 30.0 - 36.0 g/dL   RDW 23.7 (H) 62.8 - 31.5 %   Platelets 175 150 - 400 K/uL   nRBC 0.0 0.0 - 0.2 %   Neutrophils Relative % 65 %    Neutro Abs 2.5 1.7 - 7.7 K/uL   Lymphocytes Relative 19 %   Lymphs Abs 0.7 0.7 - 4.0 K/uL   Monocytes Relative 10 %   Monocytes Absolute 0.4 0.1 - 1.0 K/uL   Eosinophils Relative 4 %   Eosinophils Absolute 0.1 0.0 - 0.5 K/uL   Basophils Relative 0 %   Basophils Absolute 0.0 0.0 - 0.1 K/uL   Immature Granulocytes 2 %   Abs Immature Granulocytes 0.06 0.00 - 0.07 K/uL    Comment: Performed at [facility], [address] [place], [place] [address]  Hemoglobin and hematocrit, blood     Status: Abnormal   Collection Time: 12/09/23 10:39 AM  Result Value Ref Range   Hemoglobin 10.5 (L) 13.0 - 17.0 g/dL   HCT 07.3 (L)  39.0 - 52.0 %    Comment: Performed at [facility], [address], [place], [place] [address]      Assessment & Plan:  Return for fasting lab work. Continue same medications.   Problem List Items Addressed This Visit       Cardiovascular and Mediastinum   Essential hypertension - Primary     Other   Vitamin D  deficiency   Mixed hyperlipidemia    Return in about 4 months (around 05/01/2024) for fasting labs prior.   Total time spent: 25 minutes  [employer], NP  12/30/2023   This document may have been prepared by Dragon Voice Recognition software and as such may include unintentional dictation errors.

## 2024-01-05 ENCOUNTER — Encounter: Payer: Self-pay | Admitting: Oncology

## 2024-01-05 ENCOUNTER — Other Ambulatory Visit: Payer: Self-pay

## 2024-01-05 ENCOUNTER — Telehealth: Payer: Self-pay

## 2024-01-05 NOTE — Telephone Encounter (Signed)
 5/28 Received Medical Record Request, but patient has not been seen by any of our providers at [facility].  Patient was seen by Dr. Merrell Abate at [facility].  Forward request to [place].

## 2024-01-06 ENCOUNTER — Inpatient Hospital Stay

## 2024-01-06 ENCOUNTER — Encounter: Payer: Self-pay | Admitting: Oncology

## 2024-01-06 VITALS — BP 123/60

## 2024-01-06 DIAGNOSIS — N189 Chronic kidney disease, unspecified: Secondary | ICD-10-CM

## 2024-01-06 DIAGNOSIS — N1832 Chronic kidney disease, stage 3b: Secondary | ICD-10-CM | POA: Diagnosis not present

## 2024-01-06 LAB — HEMOGLOBIN AND HEMATOCRIT, BLOOD
HCT: 31.1 % — ABNORMAL LOW (ref 39.0–52.0)
Hemoglobin: 9.7 g/dL — ABNORMAL LOW (ref 13.0–17.0)

## 2024-01-06 MED ORDER — DARBEPOETIN ALFA 150 MCG/0.3ML IJ SOSY
150.0000 ug | PREFILLED_SYRINGE | Freq: Once | INTRAMUSCULAR | Status: AC
  Administered 2024-01-06: 150 ug via SUBCUTANEOUS
  Filled 2024-01-06: qty 0.3

## 2024-01-06 MED ORDER — DARBEPOETIN ALFA 200 MCG/0.4ML IJ SOSY
150.0000 ug | PREFILLED_SYRINGE | Freq: Once | INTRAMUSCULAR | Status: DC
  Filled 2024-01-06: qty 0.4

## 2024-01-12 ENCOUNTER — Other Ambulatory Visit: Payer: Self-pay | Admitting: Physician Assistant

## 2024-01-12 NOTE — Telephone Encounter (Signed)
 Last Fill: 10/14/2023  Next Visit: 02/01/2024  Last Visit: 10/27/2023  Dx: Rheumatoid arthritis involving multiple sites with positive rheumatoid factor   Current Dose per office note on 10/27/2023: prednisone  5 mg 1 tablet by mouth daily.   Okay to refill Prednisone ?

## 2024-01-18 ENCOUNTER — Other Ambulatory Visit (HOSPITAL_COMMUNITY): Payer: Self-pay

## 2024-01-18 ENCOUNTER — Telehealth: Payer: Self-pay

## 2024-01-18 NOTE — Progress Notes (Deleted)
 Office Visit Note  Patient: Michael Hinton.             Date of Birth: 1950/08/19           MRN: [id number]             PCP: Alica Antu, NP Referring: Alica Antu, NP Visit Date: 02/01/2024 Occupation: @GUAROCC @  Subjective:  No chief complaint on file.   History of Present Illness: Michael Hinton. is a 73 y.o. male ***   Holding Orencia .  Not currently taking any immunosuppressive agents.  Orencia  was previously started on 10/28/22--He was spacing orencia  by every 14 days due to history of neutropenia.  He has been off of Orencia  since October 2024. Under the care of Dr. Wilhelmenia Harada. Receiving retacrit  infusions.  PPD negative on 08/26/2023. History of nephrectomy-not a candidate for methotrexate.  He is avoiding NSAID use.  He will continue to require close lab monitoring.    Other drug-induced neutropenia (HCC): Previously on Enbrel -was previously spacing dose.  Ongoing neutropenia and anemia. Under care of Dr. Wilhelmenia Harada.    Long term systemic steroid user: He remains on Prednisone  5 mg 1 tablet by mouth daily.  He is aware of the risks of long term prednisone  use.   Renal cell adenoma, left: Incidental finding while hospitalized 9/1 to 04/14/2022.  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 Patient was admitted on 07/13/2022 for scheduled hand-assisted laparoscopic left radical nephrectomy with Dr. Ace Holder for management.   CT abdomen and pelvis 07/14/2023: Status post left nephrectomy without evidence of local recurrence or convincing evidence of metastatic disease in the abdomen or pelvis. Plan to continue to repeat CT abdomen and pelvis and chest x-ray annually for the first 3 years. Renal ultrasound performed on 08/24/2023: Surgically absent left kidney.  Increased right renal cortical agenesis that he, nonspecific but commonly seen in medical renal  disease.  3.1 cm simple cyst in right kidney lower pole noted.  Activities of Daily Living:  Patient reports morning stiffness for *** {minute/hour:19697}.   Patient {ACTIONS;DENIES/REPORTS:21021675::"Denies"} nocturnal pain.  Difficulty dressing/grooming: {ACTIONS;DENIES/REPORTS:21021675::"Denies"} Difficulty climbing stairs: {ACTIONS;DENIES/REPORTS:21021675::"Denies"} Difficulty getting out of chair: {ACTIONS;DENIES/REPORTS:21021675::"Denies"} Difficulty using hands for taps, buttons, cutlery, and/or writing: {ACTIONS;DENIES/REPORTS:21021675::"Denies"}  No Rheumatology ROS completed.   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   Weakness    AKI (acute kidney injury) (HCC)    Anemia in chronic kidney disease (CKD)    Dysphagia    Unintentional weight loss    Hypercalcemia    Renal mass 04/10/2022   Vitamin B12 deficiency 11/04/2021   DDD (degenerative disc disease), lumbar 03/11/2018   ANA positive 01/22/2017   Leucopenia 01/22/2017   Contracture of elbow 08/23/2016   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   Rheumatoid arthritis involving multiple sites with positive rheumatoid factor (HCC) 06/02/2016   High risk medication use 06/02/2016   Essential hypertension 06/02/2016   Vitamin D  deficiency 06/02/2016    Past Medical History:  Diagnosis Date   Anemia  Aortic atherosclerosis (HCC)    Atrial fibrillation (HCC)    a.) CHA2DS2VASc = 5 (age, HTN, CVA x 2, vascular disease history);  b.) rate/rhythm maintained without pharmacological intervention; chronically anticoagulated with  clopidogrel    Bilateral renal cysts 05/29/2022   Carotid artery disease (HCC) 04/11/2022   a.) doppler 04/11/2022: <50 LICA, no sig RICA.   CVA (cerebral vascular accident) [facility])    a.) MRI brain 01/14/2022: numerous chronic cerebellar infarcts; b.) CT head 04/10/2022: small old lacunar infarcts are seen in cerebellum bilaterally   Diastolic dysfunction 04/11/2022   a.) TTE 04/11/2022: EF 55-60%, mild BAE, mild MR, G1DD.   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   T2DM (type 2 diabetes mellitus) (HCC)    Thoracic ascending aortic aneurysm (HCC) 06/01/2022   a.) CT chest - measured 4.2 cm    Family History  Problem Relation Age of Onset   Heart disease Mother    Diabetes Mother    Breast cancer Mother    Kidney disease Father    Diabetes Father    Diabetes Daughter    Past Surgical History:  Procedure Laterality Date   LAPAROSCOPIC NEPHRECTOMY, HAND ASSISTED Left 07/13/2022   Procedure: HAND ASSISTED LAPAROSCOPIC RADICAL NEPHRECTOMY;  Surgeon: Dustin Gimenez, MD;  Location: [facility] ORS;  Service: Urology;  Laterality: Left;   RENAL BIOPSY  05/2022   Social History   Social History Narrative   Not on file   Immunization History  Administered Date(s) Administered   Moderna Sars-Covid-2 Vaccination 10/12/2019, 11/15/2019   PPD Test 03/20/2022, 08/23/2023   Pneumococcal Conjugate-13 07/13/2018      Objective: Vital Signs: There were no vitals taken for this visit.   Physical Exam   Musculoskeletal Exam: ***  CDAI Exam: CDAI Score: -- Patient Global: --; Provider Global: -- Swollen: --; Tender: -- Joint Exam 02/01/2024   No joint exam has been documented for this visit   There is currently no information documented on the homunculus. Go to the Rheumatology activity and complete the homunculus joint exam.  Investigation: No additional findings.  Imaging: No results found.  Recent Labs: Lab Results  Component Value Date   WBC 3.8 (L) 11/11/2023   HGB 9.7 (L) 01/06/2024   PLT 175 11/11/2023   NA 138 08/29/2023   K 3.9 08/29/2023   CL 100 08/29/2023   CO2 29 08/29/2023   GLUCOSE 152 (H) 08/29/2023   BUN 55 (H) 08/29/2023   CREATININE 3.47 (H) 08/29/2023   BILITOT 0.5 09/21/2023   ALKPHOS 71 09/21/2023   AST 32 09/21/2023   ALT 32 09/21/2023   PROT 7.1 09/21/2023   ALBUMIN 3.2 (L) 09/21/2023   CALCIUM  8.4 (L) 08/29/2023   GFRAA 86 02/03/2021   QFTBGOLD NEGATIVE 06/16/2017   QFTBGOLDPLUS INDETERMINATE (A) 08/06/2023    Speciality Comments: TB Skin Test 08/23/2023: Negative  Procedures:  No procedures performed Allergies: Patient has no known allergies.   Assessment / Plan:     Visit Diagnoses: Rheumatoid arthritis involving multiple sites with positive rheumatoid factor (HCC)  High risk medication use  Other drug-induced neutropenia (HCC)  Long term systemic steroid user  Renal cell adenoma, left  Prostate cancer (HCC)  ANA positive  Contracture of left elbow  Contracture of right elbow  Contractures of both knees  Primary hypertension  Spinal stenosis of lumbar region without neurogenic claudication  Vitamin D  deficiency  Orders: No orders of the defined types were placed in this encounter.  No orders of the defined types were placed in this encounter.   Face-to-face time spent with patient was *** minutes. Greater than 50%  of time was spent in counseling and coordination of care.  Follow-Up Instructions: No follow-ups on file.   Romayne Clubs, PA-C  Note - This record has been created using Dragon software.  Chart creation errors have been sought, but may not always  have been located. Such creation errors do not reflect on  the standard of medical care.

## 2024-01-18 NOTE — Telephone Encounter (Signed)
 Received fax from [facility] stating that they have dispensed a 30-day supply of medication for the pt, however the medication is not on their formulary.  Submitted a Prior Authorization request to [facility] for ORENCIA  SQ via CoverMyMeds. Will update once we receive a response.  Key: [id number]

## 2024-01-19 ENCOUNTER — Other Ambulatory Visit (HOSPITAL_COMMUNITY): Payer: Self-pay

## 2024-01-19 NOTE — Telephone Encounter (Signed)
 Received notification from [facility] regarding a prior authorization for ORENCIA  SQ. Authorization has been APPROVED from 11/09/2023 to 01/18/2025. Approval letter sent to scan center.  Per test claim, copay for 28 days supply is $0.00  Patient can continue to fill through [facility]: [phone]   Authorization # [id number]

## 2024-01-19 NOTE — Telephone Encounter (Signed)
 Received notification from [facility] regarding a prior authorization for ORENCIA  SQ. Authorization has been APPROVED from 11/09/2023 to 01/18/2025. Approval letter sent to scan center.  Geraldene Kleine, PharmD, MPH, BCPS, CPP Clinical Pharmacist (Rheumatology and Pulmonology)

## 2024-01-25 ENCOUNTER — Encounter: Payer: Self-pay | Admitting: Oncology

## 2024-02-01 ENCOUNTER — Ambulatory Visit: Admitting: Physician Assistant

## 2024-02-01 DIAGNOSIS — R768 Other specified abnormal immunological findings in serum: Secondary | ICD-10-CM

## 2024-02-01 DIAGNOSIS — E559 Vitamin D deficiency, unspecified: Secondary | ICD-10-CM

## 2024-02-01 DIAGNOSIS — M48061 Spinal stenosis, lumbar region without neurogenic claudication: Secondary | ICD-10-CM

## 2024-02-01 DIAGNOSIS — C61 Malignant neoplasm of prostate: Secondary | ICD-10-CM

## 2024-02-01 DIAGNOSIS — M24522 Contracture, left elbow: Secondary | ICD-10-CM

## 2024-02-01 DIAGNOSIS — D3002 Benign neoplasm of left kidney: Secondary | ICD-10-CM

## 2024-02-01 DIAGNOSIS — Z7952 Long term (current) use of systemic steroids: Secondary | ICD-10-CM

## 2024-02-01 DIAGNOSIS — I10 Essential (primary) hypertension: Secondary | ICD-10-CM

## 2024-02-01 DIAGNOSIS — Z79899 Other long term (current) drug therapy: Secondary | ICD-10-CM

## 2024-02-01 DIAGNOSIS — M24562 Contracture, left knee: Secondary | ICD-10-CM

## 2024-02-01 DIAGNOSIS — D702 Other drug-induced agranulocytosis: Secondary | ICD-10-CM

## 2024-02-01 DIAGNOSIS — M24521 Contracture, right elbow: Secondary | ICD-10-CM

## 2024-02-01 DIAGNOSIS — M0579 Rheumatoid arthritis with rheumatoid factor of multiple sites without organ or systems involvement: Secondary | ICD-10-CM

## 2024-02-02 NOTE — Progress Notes (Unsigned)
 Office Visit Note  Patient: Michael Hinton.             Date of Birth: 08-18-1950           MRN: [id number]             PCP: Carin Gauze, NP Referring: Carin Gauze, NP Visit Date: 02/16/2024 Occupation: @GUAROCC @  Subjective:  Medication monitoring   History of Present Illness: Michael Hinton. is a 73 y.o. male with history of seropositive rheumatoid arthritis.  Patient remains on prednisone  5 mg daily.  He resumed Orencia  in April 2025.  He has been spacing Orencia  to every 14 days as advised.  He has noticed an 80% improvement in his symptoms since resuming Orencia .  He has been tolerating Orencia  without any side effects or injection site reactions.  He denies any recent gaps in therapy.  He denies any joint swelling at this time.  Patient states that he has been wearing a compression sleeve on his right knee for support but states that the inflammation has improved.  He continues to have some stiffness in his left hand but denies any active inflammation. He denies any recent or recurrent infections.    Activities of Daily Living:  Patient reports morning stiffness for 1-2 hours.   Patient Denies nocturnal pain.  Difficulty dressing/grooming: Denies Difficulty climbing stairs: Denies Difficulty getting out of chair: Denies Difficulty using hands for taps, buttons, cutlery, and/or writing: Denies  Review of Systems  Constitutional:  Negative for fatigue.  HENT:  Negative for mouth sores and mouth dryness.   Eyes:  Negative for dryness.  Respiratory:  Negative for shortness of breath.   Cardiovascular:  Negative for chest pain and palpitations.  Gastrointestinal:  Negative for blood in stool, constipation and diarrhea.  Endocrine: Negative for increased urination.  Genitourinary:  Negative for involuntary urination.  Musculoskeletal:  Positive for joint pain, gait problem, joint pain, morning stiffness and muscle tenderness. Negative for joint swelling, myalgias,  muscle weakness and myalgias.  Skin:  Negative for color change, rash, hair loss and sensitivity to sunlight.  Allergic/Immunologic: Negative for susceptible to infections.  Neurological:  Negative for dizziness and headaches.  Hematological:  Negative for swollen glands.  Psychiatric/Behavioral:  Negative for depressed mood and sleep disturbance. The patient is not nervous/anxious.     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   Weakness    AKI (acute kidney injury) (HCC)    Anemia in chronic kidney disease (CKD)    Dysphagia    Unintentional weight loss    Hypercalcemia    Renal mass 04/10/2022   Vitamin B12 deficiency 11/04/2021   DDD (degenerative disc disease), lumbar 03/11/2018   ANA positive 01/22/2017   Leucopenia 01/22/2017   Contracture of elbow 08/23/2016   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   Rheumatoid arthritis involving multiple sites with positive rheumatoid factor (HCC) 06/02/2016   High risk medication use 06/02/2016   Essential hypertension 06/02/2016   Vitamin D  deficiency 06/02/2016    Past Medical History:  Diagnosis Date   Anemia    Aortic atherosclerosis (HCC)  Atrial fibrillation (HCC)    a.) CHA2DS2VASc = 5 (age, HTN, CVA x 2, vascular disease history);  b.) rate/rhythm maintained without pharmacological intervention; chronically anticoagulated with clopidogrel    Bilateral renal cysts 05/29/2022   Carotid artery disease (HCC) 04/11/2022   a.) doppler 04/11/2022: <50 LICA, no sig RICA.   CVA (cerebral vascular accident) [facility])    a.) MRI brain  01/14/2022: numerous chronic cerebellar infarcts; b.) CT head 04/10/2022: small old lacunar infarcts are seen in cerebellum bilaterally   Diastolic dysfunction 04/11/2022   a.) TTE 04/11/2022: EF 55-60%, mild BAE, mild MR, G1DD.   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   T2DM (type 2 diabetes mellitus) (HCC)    Thoracic ascending aortic aneurysm (HCC) 06/01/2022   a.) CT chest - measured 4.2 cm    Family History  Problem Relation Age of Onset   Heart disease Mother    Diabetes Mother    Breast cancer Mother    Kidney disease Father    Diabetes Father    Diabetes Daughter    Past Surgical History:  Procedure Laterality Date   LAPAROSCOPIC NEPHRECTOMY, HAND ASSISTED Left 07/13/2022   Procedure: HAND ASSISTED LAPAROSCOPIC RADICAL NEPHRECTOMY;  Surgeon: Penne Knee, MD;  Location: [facility] ORS;  Service: Urology;  Laterality: Left;   RENAL BIOPSY  05/2022   Social History   Social History Narrative   Not on file   Immunization History  Administered Date(s) Administered   Moderna Sars-Covid-2 Vaccination 10/12/2019, 11/15/2019   PPD Test 03/20/2022, 08/23/2023   Pneumococcal Conjugate-13 07/13/2018     Objective: Vital Signs: BP 113/65 (BP Location: Left Arm, Patient Position: Sitting, Cuff Size: Normal)   Pulse 70   Resp 16   Ht 5' 11 (1.803 m)   Wt 206 lb 6.4 oz (93.6 kg)   BMI 28.79  kg/m    Physical Exam Vitals and nursing note reviewed.  Constitutional:      Appearance: He is well-developed.  HENT:     Head: Normocephalic and atraumatic.  Eyes:     Conjunctiva/sclera: Conjunctivae normal.     Pupils: Pupils are equal, round, and reactive to light.  Cardiovascular:     Rate and Rhythm: Normal rate and regular rhythm.     Heart sounds: Normal heart sounds.  Pulmonary:     Effort: Pulmonary effort is normal.     Breath sounds: Normal breath sounds.  Abdominal:     General: Bowel sounds are normal.     Palpations: Abdomen is soft.  Musculoskeletal:     Cervical back: Normal range of motion and neck supple.  Skin:    General: Skin is warm and dry.     Capillary Refill: Capillary refill takes less than 2 seconds.  Neurological:     Mental Status: He is alert and oriented to person, place, and time.  Psychiatric:  Behavior: Behavior normal.      Musculoskeletal Exam: C-spine has limited range of motion.  There is a kyphosis noted.  Shoulder joints have limited internal rotation and abduction.  Flexion contractures of both elbows.  Limited range of motion of both wrist joints with synovial thickening.  Synovial thickening of all MCP joints but no active synovitis noted today.  Limited extension of both knees right worse than left.  No knee joint effusion noted.  Synovial thickening of both ankle joints but no tenderness or synovitis on exam.  CDAI Exam: CDAI Score: -- Patient Global: --; Provider Global: -- Swollen: --; Tender: -- Joint Exam 02/16/2024   No joint exam has been documented for this visit   There is currently no information documented on the homunculus. Go to the Rheumatology activity and complete the homunculus joint exam.  Investigation: No additional findings.  Imaging: No results found.  Recent Labs: Lab Results  Component Value Date   WBC 4.2 02/10/2024   HGB 9.6 (L) 02/10/2024   PLT 133 (L) 02/10/2024   NA 137  02/10/2024   K 3.1 (L) 02/10/2024   CL 113 (H) 02/10/2024   CO2 17 (L) 02/10/2024   GLUCOSE 117 (H) 02/10/2024   BUN 38 (H) 02/10/2024   CREATININE 2.28 (H) 02/10/2024   BILITOT 0.5 02/10/2024   ALKPHOS 68 02/10/2024   AST 26 02/10/2024   ALT 13 02/10/2024   PROT 6.9 02/10/2024   ALBUMIN 3.3 (L) 02/10/2024   CALCIUM  9.0 02/10/2024   GFRAA 86 02/03/2021   QFTBGOLD NEGATIVE 06/16/2017   QFTBGOLDPLUS INDETERMINATE (A) 08/06/2023    Speciality Comments: TB Skin Test 08/23/2023: Negative  Procedures:  No procedures performed Allergies: Patient has no known allergies.   Assessment / Plan:     Visit Diagnoses: Rheumatoid arthritis involving multiple sites with positive rheumatoid factor (HCC): He has no synovitis on examination today.  He has noticed about an 80% improvement in his symptoms since reinitiating Orencia  on April 2025.  He has been tolerating Orencia  125 mg subcutaneous injections every 14 days without any side effects or injection site reactions.  He has not had any recent or recurrent infections. He has been wearing a compression sleeve on his right knee for support but has not had any inflammation.  His hand pain and inflammation has improved significantly.  He remains on prednisone  5 mg daily.  Discussed the option of trying to reduce prednisone  gradually due to risks with long-term use.  Patient plans on continuing on 5 mg daily for the next 1 month and then will try reducing to 4 mg daily.  Plan to try reducing prednisone  by 1 mg every 2 months as tolerated.  He will remain on Orencia  125 mg sq injections every 14 days.  He will follow-up in the office in 3 months or sooner if needed.  High risk medication use -Orencia  125 mg sq injections every 14 days. He resumed Orencia  in April 2025. Orencia  was previously started on 10/28/22--He was spacing orencia  by every 14 days due to history of neutropenia. Resumed orencia  once cleared by hem/onc. Patient remains anemic likely  secondary to chronic kidney disease.  Reviewed Dr. Donnajean Yu's office visit note from 02/10/2024: Recommended arnesp CBC and CMP updated on 02/10/24.  PPD negative on 08/26/2023. History of nephrectomy-not a candidate for methotrexate.  He is avoiding NSAID use.  He will continue to require close lab monitoring.    Other drug-induced neutropenia (HCC) - Previously on Enbrel -was previously spacing dose.  Under care of Dr. 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  Absolute neutrophils were within normal limits on 02/10/2024.  Red blood cell count remains low at 3.57, hemoglobin 9.5, hematocrit 30.1 on 02/10/2024.  Labwork will continue to be monitored closely while on Orencia .  He will continue to space Orencia  by every 14 days.  Long term systemic steroid user -He remains on Prednisone  5 mg 1 tablet by mouth daily.  He is aware of the risks of long term prednisone  use.  A refill of prednisone  5 mg daily was sent to the pharmacy yesterday for a 30-day supply.  In 1 month the patient will try reducing prednisone  to 4 mg daily.  Plan to try reducing prednisone  by 1 mg every 2 months. Hgb A1c updated today. Plan: HgB A1c  Renal cell adenoma, left: Incidental finding while hospitalized 9/1 to 04/14/2022.  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 Patient was admitted on 07/13/2022 for scheduled hand-assisted laparoscopic left radical nephrectomy with Dr. Penne for management.   CT abdomen and pelvis 07/14/2023: Status post left nephrectomy without evidence of local recurrence or convincing evidence of metastatic disease in the abdomen or pelvis. Plan to continue to repeat CT abdomen and pelvis and chest x-ray annually for the first 3 years. Renal ultrasound performed on 08/24/2023: Surgically absent left kidney.  Increased right renal cortical agenesis that he, nonspecific but  commonly seen in medical renal disease.  3.1 cm simple cyst in right kidney lower pole noted.  Prostate cancer [facility]): Prostate Cancer (Stage II, Gleason 7 (4+3), T1C, N0, M0).  Completed IMRT radiation therapy.  Under care of Dr. Marrion office visit note from 10/20/2023 felt to be under excellent biochemical control of prostate cancer.  PSA was 0.02 on 10/14/2023--plan to recheck in 6 months.   ANA positive: No clinical features of systemic lupus at this time.  Contracture of left elbow: No inflammation noted.  Contracture of right elbow: No tenderness or swelling along the joint line.  Contractures of both knees: Limited extension of both knees, right worse than left.  Vitamin D  deficiency: He is taking vitamin D  supplement daily.  Primary hypertension: Blood pressure was 113/65 today in the office.  Spinal stenosis of lumbar region without neurogenic claudication: Intermittent discomfort in his lower back.  Limited mobility.  No symptoms of radiculopathy.  Orders: Orders Placed This Encounter  Procedures   HgB A1c   Meds ordered this encounter  Medications   folic acid  (FOLVITE ) 1 MG tablet    Sig: Take 1 tablet (1 mg total) by mouth daily.    Dispense:  90 tablet    Refill:  1    Follow-Up Instructions: Return in about 3 months (around 05/18/2024) for Rheumatoid arthritis.   Waddell CHRISTELLA Craze, PA-C  Note - This record has been created using Dragon software.  Chart creation errors have been sought, but may not always  have been located. Such creation errors do not reflect on  the standard of medical care.

## 2024-02-03 ENCOUNTER — Encounter: Payer: Self-pay | Admitting: Oncology

## 2024-02-09 ENCOUNTER — Other Ambulatory Visit: Payer: Self-pay | Admitting: *Deleted

## 2024-02-09 DIAGNOSIS — D472 Monoclonal gammopathy: Secondary | ICD-10-CM

## 2024-02-09 DIAGNOSIS — C642 Malignant neoplasm of left kidney, except renal pelvis: Secondary | ICD-10-CM

## 2024-02-10 ENCOUNTER — Inpatient Hospital Stay: Attending: Oncology

## 2024-02-10 ENCOUNTER — Encounter: Payer: Self-pay | Admitting: Oncology

## 2024-02-10 ENCOUNTER — Inpatient Hospital Stay (HOSPITAL_BASED_OUTPATIENT_CLINIC_OR_DEPARTMENT_OTHER): Admitting: Oncology

## 2024-02-10 ENCOUNTER — Other Ambulatory Visit: Payer: Self-pay

## 2024-02-10 ENCOUNTER — Inpatient Hospital Stay

## 2024-02-10 VITALS — BP 143/79 | HR 60 | Temp 96.0°F | Resp 18 | Wt 211.3 lb

## 2024-02-10 DIAGNOSIS — Z923 Personal history of irradiation: Secondary | ICD-10-CM | POA: Insufficient documentation

## 2024-02-10 DIAGNOSIS — M069 Rheumatoid arthritis, unspecified: Secondary | ICD-10-CM | POA: Diagnosis not present

## 2024-02-10 DIAGNOSIS — D631 Anemia in chronic kidney disease: Secondary | ICD-10-CM | POA: Insufficient documentation

## 2024-02-10 DIAGNOSIS — Z85528 Personal history of other malignant neoplasm of kidney: Secondary | ICD-10-CM | POA: Insufficient documentation

## 2024-02-10 DIAGNOSIS — D472 Monoclonal gammopathy: Secondary | ICD-10-CM

## 2024-02-10 DIAGNOSIS — Z8546 Personal history of malignant neoplasm of prostate: Secondary | ICD-10-CM | POA: Diagnosis not present

## 2024-02-10 DIAGNOSIS — N189 Chronic kidney disease, unspecified: Secondary | ICD-10-CM | POA: Diagnosis not present

## 2024-02-10 DIAGNOSIS — R7989 Other specified abnormal findings of blood chemistry: Secondary | ICD-10-CM | POA: Diagnosis not present

## 2024-02-10 DIAGNOSIS — R5383 Other fatigue: Secondary | ICD-10-CM | POA: Diagnosis not present

## 2024-02-10 DIAGNOSIS — Z905 Acquired absence of kidney: Secondary | ICD-10-CM | POA: Insufficient documentation

## 2024-02-10 DIAGNOSIS — C61 Malignant neoplasm of prostate: Secondary | ICD-10-CM | POA: Diagnosis not present

## 2024-02-10 DIAGNOSIS — N1831 Chronic kidney disease, stage 3a: Secondary | ICD-10-CM | POA: Insufficient documentation

## 2024-02-10 DIAGNOSIS — C642 Malignant neoplasm of left kidney, except renal pelvis: Secondary | ICD-10-CM

## 2024-02-10 DIAGNOSIS — N1832 Chronic kidney disease, stage 3b: Secondary | ICD-10-CM | POA: Diagnosis present

## 2024-02-10 DIAGNOSIS — Z87891 Personal history of nicotine dependence: Secondary | ICD-10-CM | POA: Diagnosis not present

## 2024-02-10 LAB — CMP (CANCER CENTER ONLY)
ALT: 13 U/L (ref 0–44)
AST: 26 U/L (ref 15–41)
Albumin: 3.3 g/dL — ABNORMAL LOW (ref 3.5–5.0)
Alkaline Phosphatase: 68 U/L (ref 38–126)
Anion gap: 7 (ref 5–15)
BUN: 38 mg/dL — ABNORMAL HIGH (ref 8–23)
CO2: 17 mmol/L — ABNORMAL LOW (ref 22–32)
Calcium: 9 mg/dL (ref 8.9–10.3)
Chloride: 113 mmol/L — ABNORMAL HIGH (ref 98–111)
Creatinine: 2.28 mg/dL — ABNORMAL HIGH (ref 0.61–1.24)
GFR, Estimated: 30 mL/min — ABNORMAL LOW
Glucose, Bld: 117 mg/dL — ABNORMAL HIGH (ref 70–99)
Potassium: 3.1 mmol/L — ABNORMAL LOW (ref 3.5–5.1)
Sodium: 137 mmol/L (ref 135–145)
Total Bilirubin: 0.5 mg/dL (ref 0.0–1.2)
Total Protein: 6.9 g/dL (ref 6.5–8.1)

## 2024-02-10 LAB — CBC (CANCER CENTER ONLY)
HCT: 30.6 % — ABNORMAL LOW (ref 39.0–52.0)
Hemoglobin: 9.6 g/dL — ABNORMAL LOW (ref 13.0–17.0)
MCH: 26.8 pg (ref 26.0–34.0)
MCHC: 31.4 g/dL (ref 30.0–36.0)
MCV: 85.5 fL (ref 80.0–100.0)
Platelet Count: 133 10*3/uL — ABNORMAL LOW (ref 150–400)
RBC: 3.58 MIL/uL — ABNORMAL LOW (ref 4.22–5.81)
RDW: 15.9 % — ABNORMAL HIGH (ref 11.5–15.5)
WBC Count: 4.2 10*3/uL (ref 4.0–10.5)
nRBC: 0 % (ref 0.0–0.2)

## 2024-02-10 MED ORDER — DARBEPOETIN ALFA 200 MCG/0.4ML IJ SOSY
150.0000 ug | PREFILLED_SYRINGE | Freq: Once | INTRAMUSCULAR | Status: AC
  Administered 2024-02-10: 150 ug via SUBCUTANEOUS
  Filled 2024-02-10: qty 0.4

## 2024-02-10 NOTE — Assessment & Plan Note (Signed)
 Status post IMRT and 6 months of adjuvant deprivation therapy. Follow-up with urology and radiation oncology

## 2024-02-10 NOTE — Progress Notes (Signed)
 Hematology/Oncology Progress note [phone] [phone]            Patient Care Team: Carin Gauze, NP as PCP - General (Cardiology) Babara Call, MD as Consulting Physician (Hematology and Oncology)  ASSESSMENT & PLAN:   Anemia in chronic kidney disease (CKD) Chronic anemia, likely secondary to chronic kidney disease.  Recent drop of hemoglobin possibly secondary to radiation. Check CBC, smear, iron , TIBC ferritin, protein electrophoresis, reticulocyte panel, LDH and haptoglobin.  Lab Results  Component Value Date   HGB 9.6 (L) 02/10/2024   TIBC 239 (L) 09/21/2023   IRONPCTSAT 35 09/21/2023   FERRITIN 1,626 (H) 09/21/2023    Iron  panel is not consistent with iron  deficiency.  Elevated ferritin, possibly secondary to chronic inflammation. Hemoglobin is less than 10, recommend erythropoietin replacement therapy. Recommend Aranesp  150 mg x1 Monitor hemoglobin every 4 weeks +/- Aranesp   Prostate cancer (HCC) Status post IMRT and 6 months of adjuvant deprivation therapy. Follow-up with urology and radiation oncology  MGUS (monoclonal gammopathy of unknown significance) Lab Results  Component Value Date   MPROTEIN Not Observed 09/21/2023   KPAFRELGTCHN 245.3 (H) 09/21/2023   LAMBDASER 90.5 (H) 09/21/2023   KAPLAMBRATIO 2.71 (H) 09/21/2023    Light chain MGUS, 24-hour urine M protein 77  Observation.   Elevated ferritin Possibly due to chronic inflammation due to autoimmune disease.   Negative hemochromatosis mutation PCR.   Orders Placed This Encounter  Procedures   CBC with Differential (Cancer Center Only)    Standing Status:   Future    Expected Date:   05/12/2024    Expiration Date:   08/10/2024   Iron  and TIBC    Standing Status:   Future    Expected Date:   05/12/2024    Expiration Date:   08/10/2024   Ferritin    Standing Status:   Future    Expected Date:   05/12/2024    Expiration Date:   08/10/2024   Retic Panel    Standing Status:    Future    Expected Date:   05/12/2024    Expiration Date:   08/10/2024   Hemoglobin and Hematocrit (Cancer Center Only)    Standing Status:   Future    Expected Date:   04/06/2024    Expiration Date:   07/05/2024   Hemoglobin and hematocrit, blood    Standing Status:   Future    Expected Date:   03/09/2024    Expiration Date:   06/07/2024   Follow-up per LOS All questions were answered. The patient knows to call the clinic with any problems, questions or concerns.  Call Babara, MD, PhD [facility] Hematology Oncology 02/10/2024   CHIEF COMPLAINTS/REASON FOR VISIT:  Anemia, clearance to resume  Orencia   HISTORY OF PRESENTING ILLNESS:   Michael Hinton. is a  73 y.o.  male with PMH listed below was seen in consultation at the request of  Scoggins, Amber, NP  for evaluation of anemia and leukopenia.  Chronic leukopenia, baseline of total white count is around 3. Patient has history of rheumatoid arthritis.  Previously on Enbrel .  Recently he has been switched to Humira  and status post 1 dose.  He is currently taking prednisone  for side effects from Humira .  He has a history of low white blood cell count, anemia, and B12 deficiency. He was hospitalized in September 2023 for weight loss, chronic joint pain, and acute kidney injury, during which he received two blood transfusions. His iron  levels were checked  in January 2025, showing high levels, possibly influenced by rheumatoid arthritis. He has not previously received IV iron  infusions and is currently taking iron  pills.  He has a history of kidney cancer, for which he underwent nephrectomy on 07/13/2022. Pathology showed pallilary renal cell carcinoma. 07/18/2023  CT scan showed no evidence of cancer recurrence, Sclerotic focus in the right L4 vertebral body, possibly a bone island, can not rule out metastatic disease.   He was diagnosed with prostate cancer in May 2024 [Gleason 3+4 as well as 4+3 lesion at the left lateral base and left  lateral mid up to 37% of the tissue]. completed radiation therapy in November 2024. He received 6 months of ADT is currently taking Flomax .   He has a history of rheumatoid arthritis and was previously on Humira  and Enbrel . He was on Orencia , which was held while he was on Radiation.   He has a chronic history of anemia,  08/24/2023 - 04/28/2024 patient was hospitalized due to acute on chronic kidney failure, acute on chronic anemia.  Hemoglobin was 6.7 on admission status post PRBC transfusion during hospitalization.   INTERVAL HISTORY Michael Hinton. is a 73 y.o. male who has above history reviewed by me today presents  to re-establish care for anemia due to CKD. Patient tolerated erythropoietin replacement therapy.  Hemoglobin has improved.  Review of Systems  Constitutional:  Positive for fatigue. Negative for appetite change, chills, fever and unexpected weight change.  HENT:   Negative for hearing loss and voice change.   Eyes:  Negative for eye problems and icterus.  Respiratory:  Negative for chest tightness, cough and shortness of breath.   Cardiovascular:  Negative for chest pain and leg swelling.  Gastrointestinal:  Negative for abdominal distention and abdominal pain.  Endocrine: Negative for hot flashes.  Genitourinary:  Negative for difficulty urinating, dysuria and frequency.   Musculoskeletal:  Positive for arthralgias.       Chronic joint deformity  Skin:  Negative for itching and rash.  Neurological:  Negative for light-headedness and numbness.  Hematological:  Negative for adenopathy. Does not bruise/bleed easily.  Psychiatric/Behavioral:  Negative for confusion.     MEDICAL HISTORY:  Past Medical History:  Diagnosis Date   Anemia    Aortic atherosclerosis (HCC)    Atrial fibrillation (HCC)    a.) CHA2DS2VASc = 5 (age, HTN, CVA x 2, vascular disease history);  b.) rate/rhythm maintained without pharmacological intervention; chronically anticoagulated with  clopidogrel    Bilateral renal cysts 05/29/2022   Carotid artery disease (HCC) 04/11/2022   a.) doppler 04/11/2022: <50 LICA, no sig RICA.   CVA (cerebral vascular accident) [facility])    a.) MRI brain 01/14/2022: numerous chronic cerebellar infarcts; b.) CT head 04/10/2022: small old lacunar infarcts are seen in cerebellum bilaterally   Diastolic dysfunction 04/11/2022   a.) TTE 04/11/2022: EF 55-60%, mild BAE, mild MR, G1DD.   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   a.) renal US  04/10/2022: solid heterogenous LEFT renal mass measuring 5.2 cm; b.) MRI ABD - 5.3 x 4.3 cm renal mass to posterior lip of LEFT kidney extending into renal sinus; c.)  renal Bx  05/06/2022 - (+) for RCC with clear  cell features   Rheumatoid arthritis involving multiple sites with positive rheumatoid factor (HCC) 06/02/2016   T2DM (type 2 diabetes mellitus) (HCC)    Thoracic ascending aortic aneurysm (HCC) 06/01/2022   a.) CT chest - measured 4.2 cm    SURGICAL HISTORY: Past Surgical History:  Procedure Laterality Date   LAPAROSCOPIC NEPHRECTOMY, HAND ASSISTED Left 07/13/2022   Procedure: HAND ASSISTED LAPAROSCOPIC RADICAL NEPHRECTOMY;  Surgeon: Penne Knee, MD;  Location: [facility] ORS;  Service: Urology;  Laterality: Left;   RENAL BIOPSY  05/2022    SOCIAL HISTORY: Social History   Socioeconomic History   Marital status: Married    Spouse name: Felecia   Number of children: Not on file   Years of education: Not on file   Highest education level: Not on file  Occupational History   Not on file  Tobacco Use   Smoking status: Former    Current packs/day: 0.00    Average packs/day: 0.5 packs/day for 7.0 years (3.5 ttl pk-yrs)    Types: Cigarettes     Start date: 03/16/1971    Quit date: 03/15/1978    Years since quitting: 45.9    Passive exposure: Past   Smokeless tobacco: Never  Vaping Use   Vaping status: Never Used  Substance and Sexual Activity   Alcohol use: Not Currently   Drug use: No   Sexual activity: Yes  Other Topics Concern   Not on file  Social History Narrative   Not on file   Social Drivers of Health   Financial Resource Strain: Not on file  Food Insecurity: No Food Insecurity (02/10/2024)   Hunger Vital Sign    Worried About Running Out of Food in the Last Year: Never true    Ran Out of Food in the Last Year: Never true  Transportation Needs: No Transportation Needs (08/24/2023)   PRAPARE - Administrator, Civil Service (Medical): No    Lack of Transportation (Non-Medical): No  Physical Activity: Not on file  Stress: Not on file  Social Connections: Socially Integrated (02/10/2024)   Social Connection and Isolation Panel    Frequency of Communication with Friends and Family: More than three times a week    Frequency of Social Gatherings with Friends and Family: More than three times a week    Attends Religious Services: More than 4 times per year    Active Member of [employer] or Organizations: Yes    Attends Engineer, structural: More than 4 times per year    Marital Status: Married  Catering manager Violence: Not At Risk (02/10/2024)   Humiliation, Afraid, Rape, and Kick questionnaire    Fear of Current or Ex-Partner: No    Emotionally Abused: No    Physically Abused: No    Sexually Abused: No    FAMILY HISTORY: Family History  Problem Relation Age of Onset   Heart disease Mother    Diabetes Mother    Breast cancer Mother    Kidney disease Father    Diabetes Father    Diabetes Daughter     ALLERGIES:  has no known allergies.  MEDICATIONS:  Current Outpatient Medications  Medication Sig Dispense Refill   Abatacept  (ORENCIA  CLICKJECT) 125 MG/ML SOAJ Inject 125 mg into the skin every  14 (fourteen) days. 4 mL 2   amLODipine  (NORVASC ) 10 MG tablet TAKE 1 TABLET BY MOUTH ONCE DAILY IN THE MORNING 90 tablet 0   Cholecalciferol (VITAMIN D -3) 125 MCG (5000 UT) TABS Take 1 tablet  by mouth daily. 30 tablet 4   Ferrous Sulfate  (IRON  PO) Take 1 tablet by mouth daily.     folic acid  (FOLVITE ) 1 MG tablet Take 1 tablet (1 mg total) by mouth daily. 90 tablet 1   losartan  (COZAAR ) 50 MG tablet Take 1 tablet by mouth once daily 90 tablet 0   mirtazapine  (REMERON ) 7.5 MG tablet Take 1 tablet (7.5 mg total) by mouth at bedtime. 90 tablet 3   predniSONE  (DELTASONE ) 5 MG tablet Take 1 tablet by mouth once daily with breakfast 30 tablet 0   rosuvastatin  (CRESTOR ) 40 MG tablet Take 1 tablet by mouth once daily 90 tablet 0   tamsulosin  (FLOMAX ) 0.4 MG CAPS capsule Take 1 capsule (0.4 mg total) by mouth daily after supper. 90 capsule 4   triamcinolone  ointment (KENALOG ) 0.5 % Apply 1 Application topically 2 (two) times daily. 30 g 3   No current facility-administered medications for this visit.     PHYSICAL EXAMINATION: ECOG PERFORMANCE STATUS: 1 - Symptomatic but completely ambulatory Vitals:   02/10/24 1005  BP: (!) 143/79  Pulse: 60  Resp: 18  Temp: (!) 96 F (35.6 C)  SpO2: 100%   Filed Weights   02/10/24 1005  Weight: 211 lb 4.8 oz (95.8 kg)    Physical Exam Constitutional:      General: He is not in acute distress. HENT:     Head: Normocephalic and atraumatic.  Eyes:     General: No scleral icterus. Cardiovascular:     Rate and Rhythm: Normal rate and regular rhythm.     Heart sounds: Normal heart sounds.  Pulmonary:     Effort: Pulmonary effort is normal. No respiratory distress.     Breath sounds: No wheezing.  Abdominal:     General: Bowel sounds are normal. There is no distension.     Palpations: Abdomen is soft.  Musculoskeletal:     Cervical back: Normal range of motion and neck supple.     Comments: Range of motion of right knee is limited due to  RA. Chronic joint swelling and deformity  Skin:    General: Skin is warm and dry.     Findings: No erythema or rash.  Neurological:     Mental Status: He is alert and oriented to person, place, and time. Mental status is at baseline.     Cranial Nerves: No cranial nerve deficit.     Coordination: Coordination normal.  Psychiatric:        Mood and Affect: Mood normal.     LABORATORY DATA:  I have reviewed the data as listed Lab Results  Component Value Date   WBC 4.2 02/10/2024   HGB 9.6 (L) 02/10/2024   HCT 30.6 (L) 02/10/2024   MCV 85.5 02/10/2024   PLT 133 (L) 02/10/2024   Recent Labs    08/24/23 1117 08/25/23 0538 08/26/23 0517 08/27/23 0727 08/28/23 0530 08/29/23 0427 09/21/23 1133 02/10/24 0951  NA 138   < > 141   < > 138 138  --  137  K 4.4   < > 4.3   < > 4.0 3.9  --  3.1*  CL 108   < > 112*   < > 103 100  --  113*  CO2 16*   < > 19*   < > 25 29  --  17*  GLUCOSE 87   < > 94   < > 198* 152*  --  117*  BUN 93*   < >  72*   < > 61* 55*  --  38*  CREATININE 6.60*   < > 4.74*   < > 3.75* 3.47*  --  2.28*  CALCIUM  9.4   < > 8.8*   < > 8.3* 8.4*  --  9.0  GFRNONAA 8*   < > 13*   < > 17* 18*  --  30*  PROT 7.8  --   --   --   --   --  7.1 6.9  ALBUMIN 3.5  --  2.9*  --   --   --  3.2* 3.3*  AST 36  --   --   --   --   --  32 26  ALT 27  --   --   --   --   --  32 13  ALKPHOS 68  --   --   --   --   --  71 68  BILITOT 0.7  --   --   --   --   --  0.5 0.5  BILIDIR  --   --   --   --   --   --  0.1  --   IBILI  --   --   --   --   --   --  0.4  --    < > = values in this interval not displayed.   Iron /TIBC/Ferritin/ %Sat    Component Value Date/Time   IRON  83 09/21/2023 1133   TIBC 239 (L) 09/21/2023 1133   FERRITIN 1,626 (H) 09/21/2023 1133   IRONPCTSAT 35 09/21/2023 1133   IRONPCTSAT 37 03/06/2022 1342      RADIOGRAPHIC STUDIES: I have personally reviewed the radiological images as listed and agreed with the findings in the report. No results  found.

## 2024-02-10 NOTE — Assessment & Plan Note (Addendum)
 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

## 2024-02-10 NOTE — Assessment & Plan Note (Signed)
 Possibly due to chronic inflammation due to autoimmune disease.   Negative hemochromatosis mutation PCR.

## 2024-02-10 NOTE — Assessment & Plan Note (Addendum)
 Chronic anemia, likely secondary to chronic kidney disease.  Recent drop of hemoglobin possibly secondary to radiation. Check CBC, smear, iron , TIBC ferritin, protein electrophoresis, reticulocyte panel, LDH and haptoglobin.  Lab Results  Component Value Date   HGB 9.6 (L) 02/10/2024   TIBC 239 (L) 09/21/2023   IRONPCTSAT 35 09/21/2023   FERRITIN 1,626 (H) 09/21/2023    Iron  panel is not consistent with iron  deficiency.  Elevated ferritin, possibly secondary to chronic inflammation. Hemoglobin is less than 10, recommend erythropoietin replacement therapy. Recommend Aranesp  150 mg x1 Monitor hemoglobin every 4 weeks +/- Aranesp 

## 2024-02-14 ENCOUNTER — Other Ambulatory Visit: Payer: Self-pay | Admitting: Physician Assistant

## 2024-02-14 LAB — MULTIPLE MYELOMA PANEL, SERUM
Albumin SerPl Elph-Mcnc: 3.3 g/dL (ref 2.9–4.4)
Albumin/Glob SerPl: 1.2 (ref 0.7–1.7)
Alpha 1: 0.2 g/dL (ref 0.0–0.4)
Alpha2 Glob SerPl Elph-Mcnc: 0.7 g/dL (ref 0.4–1.0)
B-Globulin SerPl Elph-Mcnc: 1.3 g/dL (ref 0.7–1.3)
Gamma Glob SerPl Elph-Mcnc: 0.8 g/dL (ref 0.4–1.8)
Globulin, Total: 3 g/dL (ref 2.2–3.9)
IgA: 960 mg/dL — ABNORMAL HIGH (ref 61–437)
IgG (Immunoglobin G), Serum: 1001 mg/dL (ref 603–1613)
IgM (Immunoglobulin M), Srm: 43 mg/dL (ref 20–172)
Total Protein ELP: 6.3 g/dL (ref 6.0–8.5)

## 2024-02-14 LAB — KAPPA/LAMBDA LIGHT CHAINS
Kappa free light chain: 207 mg/L — ABNORMAL HIGH (ref 3.3–19.4)
Kappa, lambda light chain ratio: 3.08 — ABNORMAL HIGH (ref 0.26–1.65)
Lambda free light chains: 67.3 mg/L — ABNORMAL HIGH (ref 5.7–26.3)

## 2024-02-15 NOTE — Telephone Encounter (Signed)
 Last Fill: 01/12/2024   Next Visit: 02/16/2024   Last Visit: 10/27/2023   Dx: Rheumatoid arthritis involving multiple sites with positive rheumatoid factor    Current Dose per office note on 10/27/2023: prednisone  5 mg 1 tablet by mouth daily.    Okay to refill Prednisone ?

## 2024-02-16 ENCOUNTER — Ambulatory Visit: Payer: Self-pay | Attending: Physician Assistant | Admitting: Physician Assistant

## 2024-02-16 ENCOUNTER — Encounter: Payer: Self-pay | Admitting: Physician Assistant

## 2024-02-16 VITALS — BP 113/65 | HR 70 | Resp 16 | Ht 71.0 in | Wt 206.4 lb

## 2024-02-16 DIAGNOSIS — I10 Essential (primary) hypertension: Secondary | ICD-10-CM

## 2024-02-16 DIAGNOSIS — E559 Vitamin D deficiency, unspecified: Secondary | ICD-10-CM

## 2024-02-16 DIAGNOSIS — M48061 Spinal stenosis, lumbar region without neurogenic claudication: Secondary | ICD-10-CM

## 2024-02-16 DIAGNOSIS — Z7952 Long term (current) use of systemic steroids: Secondary | ICD-10-CM | POA: Diagnosis not present

## 2024-02-16 DIAGNOSIS — M24562 Contracture, left knee: Secondary | ICD-10-CM

## 2024-02-16 DIAGNOSIS — D702 Other drug-induced agranulocytosis: Secondary | ICD-10-CM | POA: Diagnosis not present

## 2024-02-16 DIAGNOSIS — Z79899 Other long term (current) drug therapy: Secondary | ICD-10-CM | POA: Diagnosis not present

## 2024-02-16 DIAGNOSIS — M0579 Rheumatoid arthritis with rheumatoid factor of multiple sites without organ or systems involvement: Secondary | ICD-10-CM

## 2024-02-16 DIAGNOSIS — C61 Malignant neoplasm of prostate: Secondary | ICD-10-CM

## 2024-02-16 DIAGNOSIS — D3002 Benign neoplasm of left kidney: Secondary | ICD-10-CM

## 2024-02-16 DIAGNOSIS — M24522 Contracture, left elbow: Secondary | ICD-10-CM

## 2024-02-16 DIAGNOSIS — M24561 Contracture, right knee: Secondary | ICD-10-CM

## 2024-02-16 DIAGNOSIS — R768 Other specified abnormal immunological findings in serum: Secondary | ICD-10-CM

## 2024-02-16 DIAGNOSIS — M24521 Contracture, right elbow: Secondary | ICD-10-CM

## 2024-02-16 MED ORDER — PREDNISONE 1 MG PO TABS
4.0000 mg | ORAL_TABLET | Freq: Every day | ORAL | 0 refills | Status: DC
Start: 2024-02-16 — End: 2024-05-16

## 2024-02-16 MED ORDER — FOLIC ACID 1 MG PO TABS: 1.0000 mg | ORAL_TABLET | Freq: Every day | ORAL | 1 refills | Status: DC

## 2024-02-17 ENCOUNTER — Ambulatory Visit: Payer: Self-pay | Admitting: Physician Assistant

## 2024-02-17 LAB — HEMOGLOBIN A1C
Hgb A1c MFr Bld: 6.6 % — ABNORMAL HIGH (ref <5.7)
Mean Plasma Glucose: 143 mg/dL
eAG (mmol/L): 7.9 mmol/L

## 2024-02-17 NOTE — Progress Notes (Signed)
 Hgb A1c is 6.6%-prediabetic.  Please notify the patient and forward results to PCP.   Plan to start reducing prednisone  by 1 mg every 1-2 months as tolerated. He will continue current prescription of 5 mg daily and then reduce to 4 mg daily.

## 2024-02-18 ENCOUNTER — Other Ambulatory Visit: Payer: Self-pay

## 2024-02-22 ENCOUNTER — Encounter: Payer: Self-pay | Admitting: Oncology

## 2024-02-22 ENCOUNTER — Other Ambulatory Visit (HOSPITAL_COMMUNITY): Payer: Self-pay

## 2024-02-22 ENCOUNTER — Other Ambulatory Visit: Payer: Self-pay

## 2024-02-22 DIAGNOSIS — Z7952 Long term (current) use of systemic steroids: Secondary | ICD-10-CM

## 2024-02-22 MED ORDER — SODIUM CHLORIDE 0.9 % IV SOLN: INTRAVENOUS | Status: AC

## 2024-02-22 NOTE — Progress Notes (Signed)
 Referral placed per Jacinta Martinis.

## 2024-02-23 ENCOUNTER — Ambulatory Visit
Admission: RE | Admit: 2024-02-23 | Discharge: 2024-02-23 | Disposition: A | Discharge status: Home / Self Care | Source: Ambulatory Visit | Attending: Nephrology | Admitting: Nephrology

## 2024-02-23 VITALS — BP 121/78 | HR 60 | Temp 97.9°F | Resp 18 | Ht 71.0 in | Wt 210.0 lb

## 2024-02-23 DIAGNOSIS — N179 Acute kidney failure, unspecified: Secondary | ICD-10-CM | POA: Diagnosis present

## 2024-02-23 DIAGNOSIS — N1832 Chronic kidney disease, stage 3b: Secondary | ICD-10-CM | POA: Insufficient documentation

## 2024-02-23 LAB — RENAL FUNCTION PANEL
Albumin: 3.4 g/dL — ABNORMAL LOW (ref 3.5–5.0)
Anion gap: 7 (ref 5–15)
BUN: 53 mg/dL — ABNORMAL HIGH (ref 8–23)
CO2: 18 mmol/L — ABNORMAL LOW (ref 22–32)
Calcium: 9.3 mg/dL (ref 8.9–10.3)
Chloride: 115 mmol/L — ABNORMAL HIGH (ref 98–111)
Creatinine, Ser: 2.43 mg/dL — ABNORMAL HIGH (ref 0.61–1.24)
GFR, Estimated: 28 mL/min — ABNORMAL LOW (ref >60)
Glucose, Bld: 116 mg/dL — ABNORMAL HIGH (ref 70–99)
Phosphorus: 3.9 mg/dL (ref 2.5–4.6)
Potassium: 4.1 mmol/L (ref 3.5–5.1)
Sodium: 140 mmol/L (ref 135–145)

## 2024-02-24 ENCOUNTER — Other Ambulatory Visit: Payer: Self-pay

## 2024-03-06 ENCOUNTER — Telehealth: Payer: Self-pay | Admitting: *Deleted

## 2024-03-06 NOTE — Telephone Encounter (Signed)
 I called the wife just to let her know that it did not have a renal panel put in and she says it was he was she said hospital first but then she said no but they had him have 8 hours of fluid and said he only has 1 kidney and so they were wanting to make sure if the numbers are getting better or worse or should he get more fluid.  She also says that you can send the message through MyChart for her it gets better with her she said.

## 2024-03-06 NOTE — Telephone Encounter (Signed)
 The wife called today about the appointment on July 31 and they usually get labs and then an injection.  A few weeks ago he had to have IV fluid and she wants to know with the July 31 labs does not have a renal panel.  The labs are just a hemoglobin hematocrit, she says if they do not have that can you ask the doctor to add a renal panel on.  Was going to help to see if he is going to need IV fluids 7/31 .

## 2024-03-07 ENCOUNTER — Encounter: Payer: Self-pay | Admitting: Oncology

## 2024-03-07 ENCOUNTER — Other Ambulatory Visit: Payer: Self-pay

## 2024-03-07 DIAGNOSIS — N189 Chronic kidney disease, unspecified: Secondary | ICD-10-CM

## 2024-03-07 NOTE — Telephone Encounter (Signed)
 From what I'm understanding.   Pt is wanting to have renal panel checked when he is here on 7/31 and possible fluids if needed. Please advise.

## 2024-03-09 ENCOUNTER — Inpatient Hospital Stay

## 2024-03-09 ENCOUNTER — Encounter: Payer: Self-pay | Admitting: Oncology

## 2024-03-09 VITALS — BP 110/69 | HR 71 | Resp 16

## 2024-03-09 DIAGNOSIS — D631 Anemia in chronic kidney disease: Secondary | ICD-10-CM

## 2024-03-09 DIAGNOSIS — N1831 Chronic kidney disease, stage 3a: Secondary | ICD-10-CM | POA: Diagnosis not present

## 2024-03-09 LAB — BASIC METABOLIC PANEL - CANCER CENTER ONLY
Anion gap: 7 (ref 5–15)
BUN: 47 mg/dL — ABNORMAL HIGH (ref 8–23)
CO2: 16 mmol/L — ABNORMAL LOW (ref 22–32)
Calcium: 9.7 mg/dL (ref 8.9–10.3)
Chloride: 114 mmol/L — ABNORMAL HIGH (ref 98–111)
Creatinine: 2.16 mg/dL — ABNORMAL HIGH (ref 0.61–1.24)
GFR, Estimated: 32 mL/min — ABNORMAL LOW (ref >60)
Glucose, Bld: 93 mg/dL (ref 70–99)
Potassium: 3.8 mmol/L (ref 3.5–5.1)
Sodium: 137 mmol/L (ref 135–145)

## 2024-03-09 LAB — HEMOGLOBIN AND HEMATOCRIT, BLOOD
HCT: 32.5 % — ABNORMAL LOW (ref 39.0–52.0)
Hemoglobin: 10.4 g/dL — ABNORMAL LOW (ref 13.0–17.0)

## 2024-03-09 MED ORDER — DARBEPOETIN ALFA 150 MCG/0.3ML IJ SOSY
150.0000 ug | PREFILLED_SYRINGE | Freq: Once | INTRAMUSCULAR | Status: AC
  Administered 2024-03-09: 150 ug via SUBCUTANEOUS
  Filled 2024-03-09: qty 0.3

## 2024-03-15 ENCOUNTER — Other Ambulatory Visit: Payer: Self-pay

## 2024-03-15 ENCOUNTER — Ambulatory Visit
Admission: RE | Admit: 2024-03-15 | Discharge: 2024-03-15 | Disposition: A | Discharge status: Home / Self Care | Source: Ambulatory Visit | Attending: Physician Assistant | Admitting: Physician Assistant

## 2024-03-15 ENCOUNTER — Other Ambulatory Visit: Payer: Self-pay | Admitting: Physician Assistant

## 2024-03-15 DIAGNOSIS — Z7952 Long term (current) use of systemic steroids: Secondary | ICD-10-CM | POA: Diagnosis present

## 2024-03-15 DIAGNOSIS — Z13820 Encounter for screening for osteoporosis: Secondary | ICD-10-CM | POA: Diagnosis present

## 2024-03-15 NOTE — Telephone Encounter (Signed)
 Last Fill: 02/15/2024  Next Visit: 05/18/2024  Last Visit: 02/16/2024  Dx: Rheumatoid arthritis involving multiple sites with positive rheumatoid factor (HCC)   Current Dose per office note on 02/16/2024: Prednisone  5 mg 1 tablet by mouth daily.   Okay to refill Prednisone ?

## 2024-03-16 ENCOUNTER — Ambulatory Visit: Payer: Self-pay | Admitting: Physician Assistant

## 2024-03-16 NOTE — Progress Notes (Signed)
 DEXA is normal--great news!!

## 2024-03-22 ENCOUNTER — Other Ambulatory Visit: Payer: Self-pay

## 2024-03-23 ENCOUNTER — Other Ambulatory Visit: Payer: Self-pay

## 2024-03-23 DIAGNOSIS — I10 Essential (primary) hypertension: Secondary | ICD-10-CM

## 2024-03-23 MED ORDER — LOSARTAN POTASSIUM 50 MG PO TABS: 50.0000 mg | ORAL_TABLET | Freq: Every day | ORAL | 0 refills | Status: DC

## 2024-03-25 ENCOUNTER — Other Ambulatory Visit: Payer: Self-pay | Admitting: Cardiology

## 2024-03-27 ENCOUNTER — Other Ambulatory Visit: Payer: Self-pay

## 2024-03-29 ENCOUNTER — Other Ambulatory Visit (HOSPITAL_COMMUNITY): Payer: Self-pay

## 2024-03-30 ENCOUNTER — Encounter: Payer: Self-pay | Admitting: Oncology

## 2024-03-31 ENCOUNTER — Other Ambulatory Visit: Payer: Self-pay | Admitting: Cardiology

## 2024-03-31 DIAGNOSIS — I10 Essential (primary) hypertension: Secondary | ICD-10-CM

## 2024-04-03 ENCOUNTER — Other Ambulatory Visit: Payer: Self-pay

## 2024-04-04 MED ORDER — ROSUVASTATIN CALCIUM 40 MG PO TABS: 40.0000 mg | ORAL_TABLET | Freq: Every day | ORAL | 0 refills | Status: DC

## 2024-04-06 ENCOUNTER — Inpatient Hospital Stay

## 2024-04-06 ENCOUNTER — Other Ambulatory Visit: Payer: Self-pay | Admitting: Cardiology

## 2024-04-06 DIAGNOSIS — I10 Essential (primary) hypertension: Secondary | ICD-10-CM

## 2024-04-07 ENCOUNTER — Other Ambulatory Visit: Payer: Self-pay | Admitting: Cardiology

## 2024-04-10 ENCOUNTER — Other Ambulatory Visit: Payer: Self-pay | Admitting: Cardiology

## 2024-04-10 DIAGNOSIS — I10 Essential (primary) hypertension: Secondary | ICD-10-CM

## 2024-04-13 ENCOUNTER — Inpatient Hospital Stay

## 2024-04-13 ENCOUNTER — Inpatient Hospital Stay: Attending: Oncology

## 2024-04-13 VITALS — BP 116/67

## 2024-04-13 DIAGNOSIS — N1831 Chronic kidney disease, stage 3a: Secondary | ICD-10-CM | POA: Insufficient documentation

## 2024-04-13 DIAGNOSIS — Z923 Personal history of irradiation: Secondary | ICD-10-CM | POA: Diagnosis not present

## 2024-04-13 DIAGNOSIS — D472 Monoclonal gammopathy: Secondary | ICD-10-CM | POA: Diagnosis not present

## 2024-04-13 DIAGNOSIS — R5383 Other fatigue: Secondary | ICD-10-CM | POA: Insufficient documentation

## 2024-04-13 DIAGNOSIS — Z8546 Personal history of malignant neoplasm of prostate: Secondary | ICD-10-CM | POA: Diagnosis not present

## 2024-04-13 DIAGNOSIS — C61 Malignant neoplasm of prostate: Secondary | ICD-10-CM

## 2024-04-13 DIAGNOSIS — Z87891 Personal history of nicotine dependence: Secondary | ICD-10-CM | POA: Diagnosis not present

## 2024-04-13 DIAGNOSIS — Z905 Acquired absence of kidney: Secondary | ICD-10-CM | POA: Diagnosis not present

## 2024-04-13 DIAGNOSIS — M069 Rheumatoid arthritis, unspecified: Secondary | ICD-10-CM | POA: Insufficient documentation

## 2024-04-13 DIAGNOSIS — Z85528 Personal history of other malignant neoplasm of kidney: Secondary | ICD-10-CM | POA: Diagnosis not present

## 2024-04-13 DIAGNOSIS — N1832 Chronic kidney disease, stage 3b: Secondary | ICD-10-CM | POA: Diagnosis present

## 2024-04-13 DIAGNOSIS — D631 Anemia in chronic kidney disease: Secondary | ICD-10-CM | POA: Diagnosis not present

## 2024-04-13 LAB — PSA: Prostatic Specific Antigen: 0.04 ng/mL (ref 0.00–4.00)

## 2024-04-13 LAB — HEMOGLOBIN AND HEMATOCRIT (CANCER CENTER ONLY)
HCT: 31.2 % — ABNORMAL LOW (ref 39.0–52.0)
Hemoglobin: 9.8 g/dL — ABNORMAL LOW (ref 13.0–17.0)

## 2024-04-13 MED ORDER — DARBEPOETIN ALFA 200 MCG/0.4ML IJ SOSY
150.0000 ug | PREFILLED_SYRINGE | Freq: Once | INTRAMUSCULAR | Status: AC
  Administered 2024-04-13: 150 ug via SUBCUTANEOUS
  Filled 2024-04-13: qty 0.4

## 2024-04-14 ENCOUNTER — Encounter: Payer: Self-pay | Admitting: Oncology

## 2024-04-15 ENCOUNTER — Other Ambulatory Visit: Payer: Self-pay | Admitting: Physician Assistant

## 2024-04-17 NOTE — Telephone Encounter (Signed)
 Last Fill: 03/15/2024   Next Visit: 05/18/2024   Last Visit: 02/16/2024   Dx: Rheumatoid arthritis involving multiple sites with positive rheumatoid factor    Current Dose per office note on 02/16/2024: Prednisone  5 mg 1 tablet by mouth daily.    Okay to refill Prednisone ?

## 2024-04-20 ENCOUNTER — Other Ambulatory Visit: Payer: Self-pay | Admitting: *Deleted

## 2024-04-20 ENCOUNTER — Encounter: Payer: Self-pay | Admitting: Radiation Oncology

## 2024-04-20 ENCOUNTER — Ambulatory Visit
Admission: RE | Admit: 2024-04-20 | Discharge: 2024-04-20 | Disposition: A | Discharge status: Home / Self Care | Source: Ambulatory Visit | Attending: Radiation Oncology | Admitting: Radiation Oncology

## 2024-04-20 VITALS — BP 121/76 | HR 65 | Temp 98.2°F | Resp 16 | Wt 208.0 lb

## 2024-04-20 DIAGNOSIS — C61 Malignant neoplasm of prostate: Secondary | ICD-10-CM | POA: Diagnosis present

## 2024-04-20 DIAGNOSIS — Z191 Hormone sensitive malignancy status: Secondary | ICD-10-CM | POA: Diagnosis not present

## 2024-04-20 DIAGNOSIS — Z923 Personal history of irradiation: Secondary | ICD-10-CM | POA: Diagnosis not present

## 2024-04-20 NOTE — Progress Notes (Signed)
 Radiation Oncology Follow up Note  Name: Michael Hinton.   Date:   04/20/2024 MRN:  [id number] DOB: 06-11-1951    This 73 y.o. male presents to the clinic today for 73-month follow-up status post image guided IMRT radiation therapy for Gleason 7 (4+3) adenocarcinoma the prostate.  REFERRING PROVIDER: Carin Gauze, NP  HPI: Patient is a 74 year old male now out 10 months having completed image guided IMRT radiation therapy for adenocarcinoma the prostate Gleason 7 (4+3) seen today in routine follow-up he is doing well he does have nocturia x 3-4 does take Flomax  and drinks as much cranberry juice as possible.  He is having no diarrhea.  His most recent PSA is 0.04 showing excellent biochemical control of his prostate cancer..  COMPLICATIONS OF TREATMENT: none  FOLLOW UP COMPLIANCE: keeps appointments   PHYSICAL EXAM:  BP 121/76   Pulse 65   Temp 98.2 F (36.8 C) (Tympanic)   Resp 16   Wt 208 lb (94.3 kg)   BMI 29.01 kg/m  Well-developed well-nourished patient in NAD. HEENT reveals PERLA, EOMI, discs not visualized.  Oral cavity is clear. No oral mucosal lesions are identified. Neck is clear without evidence of cervical or supraclavicular adenopathy. Lungs are clear to A&P. Cardiac examination is essentially unremarkable with regular rate and rhythm without murmur rub or thrill. Abdomen is benign with no organomegaly or masses noted. Motor sensory and DTR levels are equal and symmetric in the upper and lower extremities. Cranial nerves II through XII are grossly intact. Proprioception is intact. No peripheral adenopathy or edema is identified. No motor or sensory levels are noted. Crude visual fields are within normal range.  RADIOLOGY RESULTS: No current films for review  PLAN: Present time patient is doing well under excellent biochemical control of his prostate cancer.  I am pleased with his overall progress I have asked to see him back in 6 months for follow-up.  Patient knows to  call with any concerns.  I would like to take this opportunity to thank you for allowing me to participate in the care of your patient.SABRA Marcey Penton, MD

## 2024-04-24 ENCOUNTER — Encounter: Payer: Self-pay | Admitting: Oncology

## 2024-04-27 ENCOUNTER — Encounter: Payer: Self-pay | Admitting: Oncology

## 2024-05-01 ENCOUNTER — Encounter: Payer: Self-pay | Admitting: Oncology

## 2024-05-01 ENCOUNTER — Encounter: Payer: Self-pay | Admitting: Cardiology

## 2024-05-01 ENCOUNTER — Ambulatory Visit (INDEPENDENT_AMBULATORY_CARE_PROVIDER_SITE_OTHER): Admitting: Cardiology

## 2024-05-01 VITALS — BP 140/70 | HR 59 | Ht 71.0 in | Wt 210.0 lb

## 2024-05-01 DIAGNOSIS — I10 Essential (primary) hypertension: Secondary | ICD-10-CM

## 2024-05-01 DIAGNOSIS — E559 Vitamin D deficiency, unspecified: Secondary | ICD-10-CM | POA: Diagnosis not present

## 2024-05-01 DIAGNOSIS — Z131 Encounter for screening for diabetes mellitus: Secondary | ICD-10-CM

## 2024-05-01 DIAGNOSIS — E782 Mixed hyperlipidemia: Secondary | ICD-10-CM | POA: Diagnosis not present

## 2024-05-01 NOTE — Progress Notes (Signed)
 Established Patient Office Visit  Subjective:  Patient ID: Cas Tracz., male    DOB: 1951/01/08  Age: 73 y.o. MRN: [id number]  Chief Complaint  Patient presents with   Follow-up    4 month lab results    Patient in office for 4 month follow up, did not have fasting lab work done. Patient doing well, no complaints today. Patient has not had fasting lab work done. Will get lab work today.  Murmur on exam. Patient states he sees Dr. Florencio in cardiology.  Continue same medications.     No other concerns at this time.   Past Medical History:  Diagnosis Date   Anemia    Aortic atherosclerosis (HCC)    Atrial fibrillation (HCC)    a.) CHA2DS2VASc = 5 (age, HTN, CVA x 2, vascular disease history);  b.) rate/rhythm maintained without pharmacological intervention; chronically anticoagulated with clopidogrel    Bilateral renal cysts 05/29/2022   Carotid artery disease (HCC) 04/11/2022   a.) doppler 04/11/2022: <50 LICA, no sig RICA.   CVA (cerebral vascular accident) [facility])    a.) MRI brain 01/14/2022: numerous chronic cerebellar infarcts; b.) CT head 04/10/2022: small old lacunar infarcts are seen in cerebellum bilaterally   Diastolic dysfunction 04/11/2022   a.) TTE 04/11/2022: EF 55-60%, mild BAE, mild MR, G1DD.   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   T2DM (type 2 diabetes mellitus)  (HCC)    Thoracic ascending aortic aneurysm (HCC) 06/01/2022   a.) CT chest - measured 4.2 cm    Past Surgical History:  Procedure Laterality Date   LAPAROSCOPIC NEPHRECTOMY, HAND ASSISTED Left 07/13/2022   Procedure: HAND ASSISTED LAPAROSCOPIC RADICAL NEPHRECTOMY;  Surgeon: Penne Knee, MD;  Location: [facility] ORS;  Service: Urology;  Laterality: Left;   RENAL BIOPSY  05/2022    Social History   Socioeconomic History   Marital status: Married    Spouse name: Felecia   Number of children: Not on file   Years of education: Not on file   Highest education level: Not on file  Occupational History   Not on file  Tobacco Use   Smoking status: Former    Current packs/day: 0.00    Average packs/day: 0.5 packs/day for 7.0 years (3.5 ttl pk-yrs)    Types: Cigarettes    Start date: 03/16/1971    Quit date: 03/15/1978    Years since quitting: 46.1    Passive exposure: Past   Smokeless tobacco: Never  Vaping Use   Vaping status: Never Used  Substance and Sexual Activity   Alcohol use: Not Currently   Drug use: No   Sexual activity: Yes  Other Topics Concern   Not on file  Social History Narrative   Not on file   Social Drivers of Health   Financial Resource Strain: Not on file  Food Insecurity: No  Food Insecurity (02/10/2024)   Hunger Vital Sign    Worried About Running Out of Food in the Last Year: Never true    Ran Out of Food in the Last Year: Never true  Transportation Needs: No Transportation Needs (08/24/2023)   PRAPARE - Administrator, Civil Service (Medical): No    Lack of Transportation (Non-Medical): No  Physical Activity: Not on file  Stress: Not on file  Social Connections: Socially Integrated (02/10/2024)   Social Connection and Isolation Panel    Frequency of Communication with Friends and Family: More than three times a week    Frequency of Social Gatherings with Friends and Family: More than three times a week    Attends Religious Services: More than  4 times per year    Active Member of [employer] or Organizations: Yes    Attends Engineer, structural: More than 4 times per year    Marital Status: Married  Catering manager Violence: Not At Risk (02/10/2024)   Humiliation, Afraid, Rape, and Kick questionnaire    Fear of Current or Ex-Partner: No    Emotionally Abused: No    Physically Abused: No    Sexually Abused: No    Family History  Problem Relation Age of Onset   Heart disease Mother    Diabetes Mother    Breast cancer Mother    Kidney disease Father    Diabetes Father    Diabetes Daughter     No Known Allergies  Outpatient Medications Prior to Visit  Medication Sig   Abatacept  (ORENCIA  CLICKJECT) 125 MG/ML SOAJ Inject 125 mg into the skin every 14 (fourteen) days.   amLODipine  (NORVASC ) 10 MG tablet TAKE 1 TABLET BY MOUTH ONCE DAILY IN THE MORNING   Cholecalciferol (VITAMIN D -3) 125 MCG (5000 UT) TABS Take 1 tablet by mouth daily.   Ferrous Sulfate  (IRON  PO) Take 1 tablet by mouth daily.   folic acid  (FOLVITE ) 1 MG tablet Take 1 tablet (1 mg total) by mouth daily.   losartan  (COZAAR ) 50 MG tablet Take 1 tablet by mouth once daily   mirtazapine  (REMERON ) 7.5 MG tablet Take 1 tablet (7.5 mg total) by mouth at bedtime.   predniSONE  (DELTASONE ) 1 MG tablet Take 4 tablets (4 mg total) by mouth daily with breakfast. Take in the morning with breakfast. Do not take with NSAIDS.   predniSONE  (DELTASONE ) 5 MG tablet Take 1 tablet by mouth once daily with breakfast   rosuvastatin  (CRESTOR ) 40 MG tablet Take 1 tablet (40 mg total) by mouth daily.   tamsulosin  (FLOMAX ) 0.4 MG CAPS capsule Take 1 capsule (0.4 mg total) by mouth daily after supper.   triamcinolone  ointment (KENALOG ) 0.5 % Apply 1 Application topically 2 (two) times daily. (Patient not taking: Reported on 05/01/2024)   No facility-administered medications prior to visit.    Review of Systems  Constitutional: Negative.   HENT: Negative.    Eyes: Negative.    Respiratory: Negative.  Negative for shortness of breath.   Cardiovascular: Negative.  Negative for chest pain.  Gastrointestinal: Negative.  Negative for abdominal pain, constipation and diarrhea.  Genitourinary: Negative.   Musculoskeletal:  Negative for joint pain and myalgias.  Skin: Negative.   Neurological: Negative.  Negative for dizziness and headaches.  Endo/Heme/Allergies: Negative.   All other systems reviewed and are negative.      Objective:   BP (!) 140/70   Pulse (!) 59   Ht 5' 11 (1.803 m)   Wt 210  lb (95.3 kg)   SpO2 99%   BMI 29.29 kg/m   Vitals:   05/01/24 1108  BP: (!) 140/70  Pulse: (!) 59  Height: 5' 11 (1.803 m)  Weight: 210 lb (95.3 kg)  SpO2: 99%  BMI (Calculated): 29.3    Physical Exam Nursing note reviewed.  Constitutional:      Appearance: Normal appearance. He is normal weight.  HENT:     Head: Normocephalic and atraumatic.     Nose: Nose normal.     Mouth/Throat:     Mouth: Mucous membranes are moist.     Pharynx: Oropharynx is clear.  Eyes:     Extraocular Movements: Extraocular movements intact.     Conjunctiva/sclera: Conjunctivae normal.     Pupils: Pupils are equal, round, and reactive to light.  Cardiovascular:     Rate and Rhythm: Normal rate and regular rhythm.     Pulses: Normal pulses.     Heart sounds: Murmur heard.  Pulmonary:     Effort: Pulmonary effort is normal.     Breath sounds: Normal breath sounds.  Abdominal:     General: Abdomen is flat. Bowel sounds are normal.     Palpations: Abdomen is soft.  Musculoskeletal:        General: Normal range of motion.     Cervical back: Normal range of motion.  Skin:    General: Skin is warm and dry.  Neurological:     General: No focal deficit present.     Mental Status: He is alert and oriented to person, place, and time.  Psychiatric:        Mood and Affect: Mood normal.        Behavior: Behavior normal.        Thought Content: Thought content normal.         Judgment: Judgment normal.      No results found for any visits on 05/01/24.  Recent Results (from the past 2160 hours)  CBC (Cancer Center Only)     Status: Abnormal   Collection Time: 02/10/24  9:51 AM  Result Value Ref Range   WBC Count 4.2 4.0 - 10.5 K/uL   RBC 3.58 (L) 4.22 - 5.81 MIL/uL   Hemoglobin 9.6 (L) 13.0 - 17.0 g/dL   HCT 69.3 (L) 60.9 - 47.9 %   MCV 85.5 80.0 - 100.0 fL   MCH 26.8 26.0 - 34.0 pg   MCHC 31.4 30.0 - 36.0 g/dL   RDW 84.0 (H) 88.4 - 84.4 %   Platelet Count 133 (L) 150 - 400 K/uL    Comment: SPECIMEN CHECKED FOR CLOTS PLATELET COUNT CONFIRMED BY SMEAR    nRBC 0.0 0.0 - 0.2 %    Comment: Performed at [facility], [address] [place], [place] [address]  Multiple Myeloma Panel (SPEP&IFE w/QIG)     Status: Abnormal   Collection Time: 02/10/24  9:51 AM  Result Value Ref Range   IgG (Immunoglobin G), Serum 1,001 603 - 1,613 mg/dL   IgA 039 (H) 61 - 562 mg/dL    Comment: (NOTE) Results confirmed on dilution.    IgM (Immunoglobulin M), Srm 43 20 - 172 mg/dL   Total Protein ELP 6.3 6.0 - 8.5 g/dL   Albumin SerPl Elph-Mcnc 3.3 2.9 - 4.4 g/dL   Alpha 1 0.2 0.0 - 0.4 g/dL   Alpha2 Glob SerPl Elph-Mcnc 0.7 0.4 - 1.0 g/dL   B-Globulin SerPl Elph-Mcnc 1.3 0.7 - 1.3 g/dL   Gamma Glob  SerPl Elph-Mcnc 0.8 0.4 - 1.8 g/dL   M Protein SerPl Elph-Mcnc Not Observed Not Observed g/dL   Globulin, Total 3.0 2.2 - 3.9 g/dL   Albumin/Glob SerPl 1.2 0.7 - 1.7   IFE 1 Comment (A)     Comment: Polyclonal increase detected in one or more immunoglobulins.   Please Note Comment     Comment: (NOTE) Protein electrophoresis scan will follow via computer, mail, or courier delivery. Performed At: [facility] [address] [place], [place] [id number] Jennette Shorter MD Ey:[phone]   CMP (Cancer Center only)     Status: Abnormal   Collection Time: 02/10/24  9:51 AM  Result Value Ref Range   Sodium 137 135 - 145 mmol/L   Potassium 3.1 (L) 3.5 - 5.1  mmol/L   Chloride 113 (H) 98 - 111 mmol/L   CO2 17 (L) 22 - 32 mmol/L   Glucose, Bld 117 (H) 70 - 99 mg/dL    Comment: Glucose reference range applies only to samples taken after fasting for at least 8 hours.   BUN 38 (H) 8 - 23 mg/dL   Creatinine 7.71 (H) 9.38 - 1.24 mg/dL   Calcium  9.0 8.9 - 10.3 mg/dL   Total Protein 6.9 6.5 - 8.1 g/dL   Albumin 3.3 (L) 3.5 - 5.0 g/dL   AST 26 15 - 41 U/L   ALT 13 0 - 44 U/L   Alkaline Phosphatase 68 38 - 126 U/L   Total Bilirubin 0.5 0.0 - 1.2 mg/dL   GFR, Estimated 30 (L) >60 mL/min    Comment: (NOTE) Calculated using the CKD-EPI Creatinine Equation (2021)    Anion gap 7 5 - 15    Comment: Performed at [facility], [address] [place], [place] [address]  Kappa/lambda light chains     Status: Abnormal   Collection Time: 02/10/24  9:51 AM  Result Value Ref Range   Kappa free light chain 207.0 (H) 3.3 - 19.4 mg/L   Lambda free light chains 67.3 (H) 5.7 - 26.3 mg/L   Kappa, lambda light chain ratio 3.08 (H) 0.26 - 1.65    Comment: (NOTE) Performed At: [facility] [address] [place], [place] [id number] Jennette Shorter MD Ey:[phone]   HgB A1c     Status: Abnormal   Collection Time: 02/16/24  1:52 PM  Result Value Ref Range   Hgb A1c MFr Bld 6.6 (H) <5.7 %    Comment: For someone without known diabetes, a hemoglobin A1c value of 6.5% or greater indicates that they may have  diabetes and this should be confirmed with a follow-up  test. . For someone with known diabetes, a value <7% indicates  that their diabetes is well controlled and a value  greater than or equal to 7% indicates suboptimal  control. A1c targets should be individualized based on  duration of diabetes, age, comorbid conditions, and  other considerations. . Currently, no consensus exists regarding use of hemoglobin A1c for diagnosis of diabetes for children. .    Mean Plasma Glucose 143 mg/dL   eAG (mmol/L) 7.9 mmol/L  Renal function panel      Status: Abnormal   Collection Time: 02/23/24  2:36 PM  Result Value Ref Range   Sodium 140 135 - 145 mmol/L   Potassium 4.1 3.5 - 5.1 mmol/L   Chloride 115 (H) 98 - 111 mmol/L   CO2 18 (L) 22 - 32 mmol/L   Glucose, Bld 116 (H) 70 - 99 mg/dL  Comment: Glucose reference range applies only to samples taken after fasting for at least 8 hours.   BUN 53 (H) 8 - 23 mg/dL   Creatinine, Ser 7.56 (H) 0.61 - 1.24 mg/dL   Calcium  9.3 8.9 - 10.3 mg/dL   Phosphorus 3.9 2.5 - 4.6 mg/dL   Albumin 3.4 (L) 3.5 - 5.0 g/dL   GFR, Estimated 28 (L) >60 mL/min    Comment: (NOTE) Calculated using the CKD-EPI Creatinine Equation (2021)    Anion gap 7 5 - 15    Comment: Performed at [facility], [address] [place], [place] [address]  Basic Metabolic Panel - Cancer Center Only     Status: Abnormal   Collection Time: 03/09/24  9:56 AM  Result Value Ref Range   Sodium 137 135 - 145 mmol/L   Potassium 3.8 3.5 - 5.1 mmol/L   Chloride 114 (H) 98 - 111 mmol/L   CO2 16 (L) 22 - 32 mmol/L   Glucose, Bld 93 70 - 99 mg/dL    Comment: Glucose reference range applies only to samples taken after fasting for at least 8 hours.   BUN 47 (H) 8 - 23 mg/dL   Creatinine 7.83 (H) 9.38 - 1.24 mg/dL   Calcium  9.7 8.9 - 10.3 mg/dL   GFR, Estimated 32 (L) >60 mL/min    Comment: (NOTE) Calculated using the CKD-EPI Creatinine Equation (2021)    Anion gap 7 5 - 15    Comment: Performed at [facility], [address] [place], [place] [address]  Hemoglobin and hematocrit, blood     Status: Abnormal   Collection Time: 03/09/24  9:56 AM  Result Value Ref Range   Hemoglobin 10.4 (L) 13.0 - 17.0 g/dL   HCT 67.4 (L) 60.9 - 47.9 %    Comment: Performed at [facility], [address] [place], [place] [address]  Hemoglobin and Hematocrit (Cancer Center Only)     Status: Abnormal   Collection Time: 04/13/24  2:06 PM  Result Value Ref Range   Hemoglobin 9.8 (L) 13.0 - 17.0 g/dL   HCT 68.7 (L) 60.9  - 52.0 %    Comment: Performed at [facility], [address] [place], [place] [address]  PSA     Status: None   Collection Time: 04/13/24  2:06 PM  Result Value Ref Range   Prostatic Specific Antigen 0.04 0.00 - 4.00 ng/mL    Comment: (NOTE) While PSA levels of <=4.00 ng/ml are reported as reference range, some men with levels below 4.00 ng/ml can have prostate cancer and many men with PSA above 4.00 ng/ml do not have prostate cancer.  Other tests such as free PSA, age specific reference ranges, PSA velocity and PSA doubling time may be helpful especially in men less than 70 years old. Performed at [facility], [address], [place], [place] [address]       Assessment & Plan:  Continue same medications.  Problem List Items Addressed This Visit       Cardiovascular and Mediastinum   Essential hypertension - Primary     Other   Vitamin D  deficiency   Relevant Orders   Vitamin D  (25 hydroxy)   Mixed hyperlipidemia   Relevant Orders   Lipid Profile   Other Visit Diagnoses       Diabetes mellitus screening       Relevant Orders   Hemoglobin A1c       Return in about 4 months (  around 08/31/2024) for fasitng lab work prior.   Total time spent: 25 minutes  [employer], NP  05/01/2024   This document may have been prepared by Dragon Voice Recognition software and as such may include unintentional dictation errors.

## 2024-05-02 ENCOUNTER — Ambulatory Visit: Payer: Self-pay | Admitting: Cardiology

## 2024-05-02 LAB — VITAMIN D 25 HYDROXY (VIT D DEFICIENCY, FRACTURES): Vit D, 25-Hydroxy: 44.7 ng/mL (ref 30.0–100.0)

## 2024-05-02 LAB — LIPID PANEL
Chol/HDL Ratio: 2.1 ratio (ref 0.0–5.0)
Cholesterol, Total: 95 mg/dL — ABNORMAL LOW (ref 100–199)
HDL: 46 mg/dL (ref >39)
LDL Chol Calc (NIH): 34 mg/dL (ref 0–99)
Triglycerides: 69 mg/dL (ref 0–149)
VLDL Cholesterol Cal: 15 mg/dL (ref 5–40)

## 2024-05-02 LAB — HEMOGLOBIN A1C
Est. average glucose Bld gHb Est-mCnc: 128 mg/dL
Hgb A1c MFr Bld: 6.1 % — ABNORMAL HIGH (ref 4.8–5.6)

## 2024-05-04 NOTE — Progress Notes (Deleted)
 Office Visit Note  Patient: Michael Hinton.             Date of Birth: 02/11/1951           MRN: [id number]             PCP: Carin Gauze, NP Referring: Carin Gauze, NP Visit Date: 05/18/2024 Occupation: Data Unavailable  Subjective:  No chief complaint on file.   History of Present Illness: Michael Hinton. is a 73 y.o. male ***     Activities of Daily Living:  Patient reports morning stiffness for *** {minute/hour:19697}.   Patient {ACTIONS;DENIES/REPORTS:21021675::Denies} nocturnal pain.  Difficulty dressing/grooming: {ACTIONS;DENIES/REPORTS:21021675::Denies} Difficulty climbing stairs: {ACTIONS;DENIES/REPORTS:21021675::Denies} Difficulty getting out of chair: {ACTIONS;DENIES/REPORTS:21021675::Denies} Difficulty using hands for taps, buttons, cutlery, and/or writing: {ACTIONS;DENIES/REPORTS:21021675::Denies}  No Rheumatology ROS completed.   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   Weakness    AKI (acute kidney injury)    Anemia in chronic kidney disease (CKD)    Dysphagia    Unintentional weight loss    Hypercalcemia    Renal mass 04/10/2022   Vitamin B12 deficiency 11/04/2021   DDD (degenerative disc disease), lumbar 03/11/2018   ANA positive 01/22/2017   Leucopenia 01/22/2017   Contracture of elbow 08/23/2016   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   CVA (cerebral vascular accident) [facility])    a.) MRI brain 01/14/2022: numerous chronic cerebellar infarcts; b.) CT head 04/10/2022: small old lacunar infarcts are seen in cerebellum bilaterally   Diastolic dysfunction 04/11/2022   a.) TTE 04/11/2022: EF 55-60%, mild BAE, mild MR, G1DD.   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   T2DM (type 2 diabetes mellitus) (HCC)    Thoracic ascending aortic aneurysm 06/01/2022   a.) CT chest - measured 4.2 cm    Family History  Problem Relation Age of Onset   Heart disease Mother  Diabetes Mother    Breast cancer Mother    Kidney disease Father    Diabetes Father    Diabetes Daughter    Past Surgical History:  Procedure Laterality Date   LAPAROSCOPIC NEPHRECTOMY, HAND ASSISTED Left 07/13/2022   Procedure: HAND ASSISTED LAPAROSCOPIC RADICAL NEPHRECTOMY;  Surgeon: Penne Knee, MD;  Location: [facility] ORS;  Service: Urology;  Laterality: Left;   RENAL  BIOPSY  05/2022   Social History   Tobacco Use   Smoking status: Former    Current packs/day: 0.00    Average packs/day: 0.5 packs/day for 7.0 years (3.5 ttl pk-yrs)    Types: Cigarettes    Start date: 03/16/1971    Quit date: 03/15/1978    Years since quitting: 46.1    Passive exposure: Past   Smokeless tobacco: Never  Vaping Use   Vaping status: Never Used  Substance Use Topics   Alcohol use: Not Currently   Drug use: No   Social History   Social History Narrative   Not on file     Immunization History  Administered Date(s) Administered   Moderna Sars-Covid-2 Vaccination 10/12/2019, 11/15/2019   PPD Test 03/20/2022, 08/23/2023   Pneumococcal Conjugate-13 07/13/2018     Objective: Vital Signs: There were no vitals taken for this visit.   Physical Exam   Musculoskeletal Exam: ***  CDAI Exam: CDAI Score: -- Patient Global: --; Provider Global: -- Swollen: --; Tender: -- Joint Exam 05/18/2024   No joint exam has been documented for this visit   There is currently no information documented on the homunculus. Go to the Rheumatology activity and complete the homunculus joint exam.  Investigation: No additional findings.  Imaging: No results found.  Recent Labs: Lab Results  Component Value Date   WBC 4.2 02/10/2024   HGB 9.8 (L) 04/13/2024   PLT 133 (L) 02/10/2024   NA 137 03/09/2024   K 3.8 03/09/2024   CL 114 (H) 03/09/2024   CO2 16 (L) 03/09/2024   GLUCOSE 93 03/09/2024   BUN 47 (H) 03/09/2024   CREATININE 2.16 (H) 03/09/2024   BILITOT 0.5 02/10/2024   ALKPHOS 68 02/10/2024   AST 26 02/10/2024   ALT 13 02/10/2024   PROT 6.9 02/10/2024   ALBUMIN 3.4 (L) 02/23/2024   CALCIUM  9.7 03/09/2024   GFRAA 86 02/03/2021   QFTBGOLD NEGATIVE 06/16/2017   QFTBGOLDPLUS INDETERMINATE (A) 08/06/2023    Speciality Comments: TB Skin Test 08/23/2023: Negative  Procedures:  No procedures performed Allergies: Patient has no known allergies.   Assessment /  Plan:     Visit Diagnoses: No diagnosis found.  Orders: No orders of the defined types were placed in this encounter.  No orders of the defined types were placed in this encounter.   Face-to-face time spent with patient was *** minutes. Greater than 50% of time was spent in counseling and coordination of care.  Follow-Up Instructions: No follow-ups on file.   Shelba SHAUNNA Potters, RT  Note - This record has been created using [employer].  Chart creation errors have been sought, but may not always  have been located. Such creation errors do not reflect on  the standard of medical care.

## 2024-05-11 ENCOUNTER — Encounter: Payer: Self-pay | Admitting: Oncology

## 2024-05-15 ENCOUNTER — Ambulatory Visit

## 2024-05-15 ENCOUNTER — Ambulatory Visit: Admitting: Oncology

## 2024-05-15 ENCOUNTER — Other Ambulatory Visit

## 2024-05-16 ENCOUNTER — Encounter: Payer: Self-pay | Admitting: Physician Assistant

## 2024-05-16 ENCOUNTER — Ambulatory Visit: Attending: Physician Assistant | Admitting: Physician Assistant

## 2024-05-16 ENCOUNTER — Other Ambulatory Visit: Payer: Self-pay | Admitting: *Deleted

## 2024-05-16 VITALS — BP 138/71 | HR 59 | Temp 97.5°F | Resp 16 | Ht 71.0 in | Wt 209.4 lb

## 2024-05-16 DIAGNOSIS — E559 Vitamin D deficiency, unspecified: Secondary | ICD-10-CM

## 2024-05-16 DIAGNOSIS — M48061 Spinal stenosis, lumbar region without neurogenic claudication: Secondary | ICD-10-CM

## 2024-05-16 DIAGNOSIS — Z79899 Other long term (current) drug therapy: Secondary | ICD-10-CM

## 2024-05-16 DIAGNOSIS — Z7952 Long term (current) use of systemic steroids: Secondary | ICD-10-CM | POA: Diagnosis not present

## 2024-05-16 DIAGNOSIS — D3002 Benign neoplasm of left kidney: Secondary | ICD-10-CM

## 2024-05-16 DIAGNOSIS — I10 Essential (primary) hypertension: Secondary | ICD-10-CM

## 2024-05-16 DIAGNOSIS — M24561 Contracture, right knee: Secondary | ICD-10-CM | POA: Diagnosis not present

## 2024-05-16 DIAGNOSIS — D702 Other drug-induced agranulocytosis: Secondary | ICD-10-CM | POA: Diagnosis not present

## 2024-05-16 DIAGNOSIS — R7689 Other specified abnormal immunological findings in serum: Secondary | ICD-10-CM

## 2024-05-16 DIAGNOSIS — M24522 Contracture, left elbow: Secondary | ICD-10-CM | POA: Diagnosis not present

## 2024-05-16 DIAGNOSIS — M24521 Contracture, right elbow: Secondary | ICD-10-CM | POA: Diagnosis not present

## 2024-05-16 DIAGNOSIS — C61 Malignant neoplasm of prostate: Secondary | ICD-10-CM

## 2024-05-16 DIAGNOSIS — M0579 Rheumatoid arthritis with rheumatoid factor of multiple sites without organ or systems involvement: Secondary | ICD-10-CM

## 2024-05-16 DIAGNOSIS — M24562 Contracture, left knee: Secondary | ICD-10-CM

## 2024-05-16 MED ORDER — PREDNISONE 1 MG PO TABS: 4.0000 mg | ORAL_TABLET | Freq: Every day | ORAL | 0 refills | Status: DC

## 2024-05-16 NOTE — Patient Instructions (Signed)
 Standing Labs We placed an order today for your standing lab work.   Please have your standing labs drawn in Mid-November and every 3 months   Please have your labs drawn 2 weeks prior to your appointment so that the provider can discuss your lab results at your appointment, if possible.  Please note that you may see your imaging and lab results in MyChart before we have reviewed them. We will contact you once all results are reviewed. Please allow our office up to 72 hours to thoroughly review all of the results before contacting the office for clarification of your results.  WALK-IN LAB HOURS  Monday through Thursday from 8:00 am -12:30 pm and 1:00 pm-4:30 pm and Friday from 8:00 am-12:00 pm.  Patients with office visits requiring labs will be seen before walk-in labs.  You may encounter longer than normal wait times. Please allow additional time. Wait times may be shorter on  Monday and Thursday afternoons.  We do not book appointments for walk-in labs. We appreciate your patience and understanding with our staff.   Labs are drawn by [facility]. Please bring your co-pay at the time of your lab draw.  You may receive a bill from [facility] for your lab work.  Please note if you are on Hydroxychloroquine and and an order has been placed for a Hydroxychloroquine level,  you will need to have it drawn 4 hours or more after your last dose.  If you wish to have your labs drawn at another location, please call the office 24 hours in advance so we can fax the orders.  The office is located at [address], [address], [place], [place] [address]   If you have any questions regarding directions or hours of operation,  please call [phone].   As a reminder, please drink plenty of water  prior to coming for your lab work. Thanks!

## 2024-05-16 NOTE — Telephone Encounter (Signed)
 Patient advised to reduce to prednisone  4 mg daily. Patient states he does need a refill on Prednisone .

## 2024-05-16 NOTE — Telephone Encounter (Signed)
 Please advise patient to reduce to prednisone  4 mg daily--a previous prescription was sent-please clarify if he needs a refill?

## 2024-05-16 NOTE — Telephone Encounter (Signed)
 Patient contacted the office to advise he is taking Prednisone  5 mg po daily.

## 2024-05-16 NOTE — Progress Notes (Signed)
 Office Visit Note  Patient: Michael Hinton.             Date of Birth: Oct 17, 1950           MRN: [id number]             PCP: Carin Gauze, NP Referring: Carin Gauze, NP Visit Date: 05/16/2024 Occupation: Data Unavailable  Subjective:  Medication monitoring  History of Present Illness: Michael Hinton. is a 73 y.o. male with history of seropositive rheumatoid arthritis.  Patient remains on Orencia  125 mg sq injections once every 14 days.  He has not had any recent gaps in therapy.  Patient is unsure of what dose of prednisone  he is taking but will go home and notify us .  He denies any new or worsening symptoms since his last office visit.  He denies any joint swelling but continues to have stiffness involving multiple joints.  He denies any recent flares.  He denies any recent or recurrent infections.  He denies any upcoming surgeries or procedures scheduled at this time.   Activities of Daily Living:  Patient reports joint stiffness all day  Patient Denies nocturnal pain.  Difficulty dressing/grooming: Denies Difficulty climbing stairs: Denies Difficulty getting out of chair: Denies Difficulty using hands for taps, buttons, cutlery, and/or writing: Reports  Review of Systems  Constitutional:  Negative for fatigue.  HENT:  Negative for mouth sores and mouth dryness.   Eyes:  Negative for dryness.  Respiratory:  Negative for shortness of breath.   Cardiovascular:  Negative for chest pain and palpitations.  Gastrointestinal:  Negative for blood in stool, constipation and diarrhea.  Endocrine: Negative for increased urination.  Genitourinary:  Negative for involuntary urination.  Musculoskeletal:  Positive for morning stiffness. Negative for joint pain, gait problem, joint pain, joint swelling, myalgias, muscle weakness, muscle tenderness and myalgias.  Skin:  Negative for color change, rash, hair loss and sensitivity to sunlight.  Allergic/Immunologic: Negative for  susceptible to infections.  Neurological:  Negative for dizziness and headaches.  Hematological:  Negative for swollen glands.  Psychiatric/Behavioral:  Negative for depressed mood and sleep disturbance. The patient is not nervous/anxious.     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   Weakness    AKI (acute kidney injury)    Anemia in chronic kidney disease (CKD)    Dysphagia    Unintentional weight loss    Hypercalcemia    Renal mass 04/10/2022   Vitamin B12 deficiency 11/04/2021   DDD (degenerative disc disease), lumbar 03/11/2018   ANA positive 01/22/2017   Leucopenia 01/22/2017   Contracture of elbow 08/23/2016   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   Rheumatoid arthritis involving multiple sites with positive rheumatoid factor (HCC) 06/02/2016   High risk medication use 06/02/2016   Essential hypertension 06/02/2016   Vitamin D  deficiency 06/02/2016    Past Medical History:  Diagnosis Date   Anemia    Aortic atherosclerosis    Atrial fibrillation (HCC)    a.) CHA2DS2VASc = 5 (age, HTN, CVA x 2, vascular disease history);  b.) rate/rhythm maintained without pharmacological intervention; chronically anticoagulated  with clopidogrel    Bilateral renal cysts 05/29/2022   Carotid artery disease 04/11/2022   a.) doppler 04/11/2022: <50 LICA, no sig RICA.   CVA (cerebral vascular accident) [facility])    a.) MRI brain 01/14/2022: numerous chronic cerebellar infarcts; b.) CT head 04/10/2022: small old lacunar infarcts are seen in cerebellum bilaterally   Diastolic dysfunction 04/11/2022   a.)  TTE 04/11/2022: EF 55-60%, mild BAE, mild MR, G1DD.   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   T2DM (type 2 diabetes mellitus) (HCC)    Thoracic ascending aortic aneurysm 06/01/2022   a.) CT chest - measured 4.2 cm    Family History  Problem Relation Age of Onset   Heart disease Mother    Diabetes Mother    Breast cancer Mother    Kidney disease Father    Diabetes Father    Diabetes Daughter    Past Surgical History:  Procedure Laterality Date   LAPAROSCOPIC NEPHRECTOMY, HAND ASSISTED Left 07/13/2022   Procedure: HAND ASSISTED LAPAROSCOPIC RADICAL NEPHRECTOMY;  Surgeon: Penne Knee, MD;  Location: [facility] ORS;  Service: Urology;  Laterality: Left;   RENAL BIOPSY  05/2022   Social History   Tobacco Use   Smoking status: Former    Current packs/day: 0.00    Average packs/day: 0.5 packs/day for 7.0 years (3.5 ttl pk-yrs)    Types: Cigarettes    Start date: 03/16/1971    Quit date: 03/15/1978    Years since quitting: 46.2    Passive exposure: Past   Smokeless tobacco: Never  Vaping Use   Vaping status: Never Used  Substance Use Topics   Alcohol use: Not Currently   Drug use: No   Social History   Social History Narrative   Not on file     Immunization History  Administered Date(s) Administered   Moderna Sars-Covid-2 Vaccination 10/12/2019, 11/15/2019   PPD Test 03/20/2022, 08/23/2023    Pneumococcal Conjugate-13 07/13/2018     Objective: Vital Signs: BP 138/71   Pulse (!) 59   Temp (!) 97.5 F (36.4 C)   Resp 16   Ht 5' 11 (1.803 m)   Wt 209 lb 6.4 oz (95 kg)   BMI 29.21 kg/m    Physical Exam Vitals and nursing note reviewed.  Constitutional:      Appearance: He is well-developed.  HENT:     Head: Normocephalic and atraumatic.  Eyes:     Conjunctiva/sclera: Conjunctivae normal.     Pupils: Pupils are equal, round, and reactive to light.  Cardiovascular:     Rate and Rhythm: Normal rate and regular rhythm.     Heart sounds: Normal heart sounds.  Pulmonary:     Effort: Pulmonary effort is normal.     Breath sounds: Normal breath sounds.  Abdominal:     General: Bowel sounds are normal.     Palpations:  Abdomen is soft.  Musculoskeletal:     Cervical back: Normal range of motion and neck supple.  Skin:    General: Skin is warm and dry.     Capillary Refill: Capillary refill takes less than 2 seconds.  Neurological:     Mental Status: He is alert and oriented to person, place, and time.  Psychiatric:        Behavior: Behavior normal.      Musculoskeletal Exam: C-spine has limited range of motion.  Thoracic kyphosis noted.  Shoulder joints have limited internal rotation and abduction bilaterally.  Flexion contractures of both elbows.  Limited range of motion of both wrist joints with synovial thickening.  Synovial thickening of all MCP joints but no active synovitis.  Limited flexion and extension of both knees, right more severe than left.  Synovial thickening of both ankle joints but no active synovitis noted.  CDAI Exam: CDAI Score: -- Patient Global: --; Provider Global: -- Swollen: --; Tender: -- Joint Exam 05/16/2024   No joint exam has been documented for this visit   There is currently no information documented on the homunculus. Go to the Rheumatology activity and complete the homunculus joint exam.  Investigation: No additional  findings.  Imaging: No results found.  Recent Labs: Lab Results  Component Value Date   WBC 4.2 02/10/2024   HGB 9.8 (L) 04/13/2024   PLT 133 (L) 02/10/2024   NA 137 03/09/2024   K 3.8 03/09/2024   CL 114 (H) 03/09/2024   CO2 16 (L) 03/09/2024   GLUCOSE 93 03/09/2024   BUN 47 (H) 03/09/2024   CREATININE 2.16 (H) 03/09/2024   BILITOT 0.5 02/10/2024   ALKPHOS 68 02/10/2024   AST 26 02/10/2024   ALT 13 02/10/2024   PROT 6.9 02/10/2024   ALBUMIN 3.4 (L) 02/23/2024   CALCIUM  9.7 03/09/2024   GFRAA 86 02/03/2021   QFTBGOLD NEGATIVE 06/16/2017   QFTBGOLDPLUS INDETERMINATE (A) 08/06/2023    Speciality Comments: TB Skin Test 08/23/2023: Negative  Procedures:  No procedures performed Allergies: Patient has no known allergies.     Assessment / Plan:     Visit Diagnoses: Rheumatoid arthritis involving multiple sites with positive rheumatoid factor (HCC): He has no synovitis on examination today.  He has not had any signs or symptoms of a flare since his last office visit.  He continues to have joint stiffness on a daily basis but has not noticed any increased inflammation.  He remains on Orencia  125 mg subcu injections once every 14 days.  He had previously noticed an 80% improvement in his symptoms since reinitiating Orencia  in April 2025 but is now unsure how effective Orencia  is that managing his symptoms.  He has not noticed any new or worsening symptoms since his last office visit.  I feel that overall Orencia  is currently controlling his symptoms from rheumatoid arthritis.  Discussed that I would like to continue to try to taper the dose of prednisone  due to the risks of long-term systemic steroid use.  He is unsure of what dose of prednisone  he is currently taking.  The plan was to try reducing prednisone  by 1 mg every 2 months as tolerated.  Once he gets home he will check the dosing of prednisone  and call us  back. He will remain on Orencia  as prescribed with close lab monitoring.   He will notify us  if he develops any new or worsening symptoms.  He will follow-up in the office in 3 months or sooner if needed.  High risk medication use: Orencia  125 mg sq injections every 14 days. Plan to try to continue prednisone  1 mg every 2 months.  He resumed Orencia  in April 2025. Orencia  was previously started on 10/28/22--He was spacing orencia  by every 14 days due to history of neutropenia. Resumed orencia  once cleared by hem/onc. Patient remains anemic likely secondary to chronic kidney disease.  CBC and renal panel updated on 03/20/24. He will continue to require updated lab work every 3 months.  PPD negative on 08/26/2023. Lipid panel updated on 05/01/24 Hgb A1c updated on 05/01/24 No recent or recurrent infections. Discussed the importance of holding orencia  if he develops signs or symptoms of an infeciton and to resume once the infection has cleared.  Not a candidate for methotrexate.  He is avoiding NSAID use.   Long term systemic steroid user: He is unsure of what dose of prednisone  he is taking- 5 mg vs. 4 mg-He plans to check the dose of prednisone  when he gets home and will call back.  Hgb A1c updated on 05/01/24.  DEXA within normal limits on 03/15/2024. Plan to try reducing prednisone  by 1 mg every 2 months as tolerated.   Other drug-induced neutropenia:Previously on Enbrel -was previously spacing dose. Under care of Dr. 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   White blood cell count was 4.3 on 03/20/2024.  Absolute lymphocyte count remains low. Plan to continue to space Orencia  to every 14 days.   Renal cell adenoma, left: Incidental finding while hospitalized 9/1 to 04/14/2022.  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 Patient was admitted on 07/13/2022 for scheduled hand-assisted laparoscopic left radical nephrectomy with Dr.  Penne for management.   CT abdomen and pelvis 07/14/2023: Status post left nephrectomy without evidence of local recurrence or convincing evidence of metastatic disease in the abdomen or pelvis. Plan to continue to repeat CT abdomen and pelvis and chest x-ray annually for the first 3 years. Renal ultrasound performed on 08/24/2023: Surgically absent left kidney.  Increased right renal cortical agenesis that he, nonspecific but commonly seen in medical renal disease.  3.1 cm simple cyst in right kidney lower pole noted.  Prostate cancer [facility]): Prostate Cancer (Stage II, Gleason 7 (4+3), T1C, N0, M0).  Completed IMRT radiation therapy.  Under care of Dr. Marrion office visit note from 10/20/2023 felt to be under excellent biochemical control of prostate cancer.  PSA was 0.02 on 10/14/2023--plan to recheck in 6 months.   ANA positive: No clinical features of systemic lupus at this time.  Contracture of left elbow: Flexion contracture .    Contracture of right elbow: Flexion contracture.    Contractures of both knees: Limited extension of both knees.  More severe flexion contracture of the right knee noted compared to the left.  No effusion noted.  Other medical conditions are listed as follows:  Vitamin D  deficiency: DEXA within normal limits on 03/15/2024.  Primary hypertension: Blood pressure was 138/71 today in the office.  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  No orders of the defined types were placed in this encounter.    Follow-Up Instructions: Return in about 3 months (around 08/16/2024) for Rheumatoid arthritis.   Waddell CHRISTELLA Craze, PA-C  Note - This record has been created using Dragon software.  Chart creation errors have been  sought, but may not always  have been located. Such creation errors do not reflect on  the standard of medical care.

## 2024-05-16 NOTE — Addendum Note (Signed)
 Addended by: CENA ALFONSO CROME on: 05/16/2024 01:30 PM   Modules accepted: Orders

## 2024-05-18 ENCOUNTER — Ambulatory Visit: Admitting: Physician Assistant

## 2024-05-18 ENCOUNTER — Encounter: Payer: Self-pay | Admitting: Oncology

## 2024-05-18 DIAGNOSIS — Z7952 Long term (current) use of systemic steroids: Secondary | ICD-10-CM

## 2024-05-18 DIAGNOSIS — M24561 Contracture, right knee: Secondary | ICD-10-CM

## 2024-05-18 DIAGNOSIS — M48061 Spinal stenosis, lumbar region without neurogenic claudication: Secondary | ICD-10-CM

## 2024-05-18 DIAGNOSIS — M0579 Rheumatoid arthritis with rheumatoid factor of multiple sites without organ or systems involvement: Secondary | ICD-10-CM

## 2024-05-18 DIAGNOSIS — R768 Other specified abnormal immunological findings in serum: Secondary | ICD-10-CM

## 2024-05-18 DIAGNOSIS — I10 Essential (primary) hypertension: Secondary | ICD-10-CM

## 2024-05-18 DIAGNOSIS — Z79899 Other long term (current) drug therapy: Secondary | ICD-10-CM

## 2024-05-18 DIAGNOSIS — D702 Other drug-induced agranulocytosis: Secondary | ICD-10-CM

## 2024-05-18 DIAGNOSIS — C61 Malignant neoplasm of prostate: Secondary | ICD-10-CM

## 2024-05-18 DIAGNOSIS — M24522 Contracture, left elbow: Secondary | ICD-10-CM

## 2024-05-18 DIAGNOSIS — E559 Vitamin D deficiency, unspecified: Secondary | ICD-10-CM

## 2024-05-18 DIAGNOSIS — M24521 Contracture, right elbow: Secondary | ICD-10-CM

## 2024-05-18 DIAGNOSIS — D3002 Benign neoplasm of left kidney: Secondary | ICD-10-CM

## 2024-05-19 ENCOUNTER — Telehealth: Payer: Self-pay | Admitting: Oncology

## 2024-05-19 NOTE — Telephone Encounter (Signed)
 Patient called to reschedule appointment from 10/13 to possible 10/16. He states he has had a death ion his family and will not be able to make it Monday. Please call to reschedule

## 2024-05-22 ENCOUNTER — Inpatient Hospital Stay

## 2024-05-22 ENCOUNTER — Inpatient Hospital Stay: Admitting: Oncology

## 2024-05-26 ENCOUNTER — Encounter: Payer: Self-pay | Admitting: Oncology

## 2024-05-26 ENCOUNTER — Other Ambulatory Visit: Payer: Self-pay

## 2024-05-26 NOTE — Progress Notes (Signed)
 Specialty Pharmacy Ongoing Clinical Assessment Note  Michael Hinton. is a 73 y.o. male who is being followed by the specialty pharmacy service for RxSp Rheumatoid Arthritis   Patient's specialty medication(s) reviewed today: Abatacept  (Orencia  [employer])   Missed doses in the last 4 weeks: 0   Patient/Caregiver did not have any additional questions or concerns.   Therapeutic benefit summary: Patient is achieving benefit   Adverse events/side effects summary: No adverse events/side effects   Patient's therapy is appropriate to: Continue    Goals Addressed             This Visit's Progress    Maintain optimal adherence to therapy   On track    Patient is on track. Patient will maintain adherence, avoid flare triggers, and be monitored by provider to determine if a change in treatment plan is warranted         Follow up: 12 months  [facility]

## 2024-05-26 NOTE — Progress Notes (Signed)
 Specialty Pharmacy Refill Coordination Note  Michael Hinton. is a 73 y.o. male contacted today regarding refills of specialty medication(s) Abatacept  (Orencia  ClickJect)   Patient requested Delivery   Delivery date: 05/31/24   Verified address: [address]   [place] [address]   Medication will be filled on 05/30/24.

## 2024-05-29 ENCOUNTER — Other Ambulatory Visit: Payer: Self-pay

## 2024-06-01 ENCOUNTER — Encounter: Payer: Self-pay | Admitting: Urology

## 2024-06-01 ENCOUNTER — Inpatient Hospital Stay: Attending: Oncology

## 2024-06-01 ENCOUNTER — Inpatient Hospital Stay

## 2024-06-01 ENCOUNTER — Inpatient Hospital Stay (HOSPITAL_BASED_OUTPATIENT_CLINIC_OR_DEPARTMENT_OTHER): Admitting: Oncology

## 2024-06-01 ENCOUNTER — Encounter: Payer: Self-pay | Admitting: Oncology

## 2024-06-01 VITALS — BP 109/63 | HR 64 | Temp 97.1°F | Resp 18 | Ht 71.0 in | Wt 206.7 lb

## 2024-06-01 DIAGNOSIS — Z8546 Personal history of malignant neoplasm of prostate: Secondary | ICD-10-CM

## 2024-06-01 DIAGNOSIS — D472 Monoclonal gammopathy: Secondary | ICD-10-CM

## 2024-06-01 DIAGNOSIS — Z87891 Personal history of nicotine dependence: Secondary | ICD-10-CM | POA: Insufficient documentation

## 2024-06-01 DIAGNOSIS — N1832 Chronic kidney disease, stage 3b: Secondary | ICD-10-CM | POA: Insufficient documentation

## 2024-06-01 DIAGNOSIS — Z85528 Personal history of other malignant neoplasm of kidney: Secondary | ICD-10-CM | POA: Insufficient documentation

## 2024-06-01 DIAGNOSIS — D631 Anemia in chronic kidney disease: Secondary | ICD-10-CM | POA: Insufficient documentation

## 2024-06-01 DIAGNOSIS — C61 Malignant neoplasm of prostate: Secondary | ICD-10-CM

## 2024-06-01 DIAGNOSIS — R5383 Other fatigue: Secondary | ICD-10-CM | POA: Insufficient documentation

## 2024-06-01 DIAGNOSIS — M069 Rheumatoid arthritis, unspecified: Secondary | ICD-10-CM | POA: Insufficient documentation

## 2024-06-01 DIAGNOSIS — Z923 Personal history of irradiation: Secondary | ICD-10-CM | POA: Diagnosis not present

## 2024-06-01 DIAGNOSIS — R7989 Other specified abnormal findings of blood chemistry: Secondary | ICD-10-CM

## 2024-06-01 DIAGNOSIS — Z905 Acquired absence of kidney: Secondary | ICD-10-CM | POA: Diagnosis not present

## 2024-06-01 DIAGNOSIS — C642 Malignant neoplasm of left kidney, except renal pelvis: Secondary | ICD-10-CM

## 2024-06-01 LAB — CBC WITH DIFFERENTIAL (CANCER CENTER ONLY)
Abs Immature Granulocytes: 0.05 K/uL (ref 0.00–0.07)
Basophils Absolute: 0 K/uL (ref 0.0–0.1)
Basophils Relative: 0 %
Eosinophils Absolute: 0.2 K/uL (ref 0.0–0.5)
Eosinophils Relative: 7 %
HCT: 32.7 % — ABNORMAL LOW (ref 39.0–52.0)
Hemoglobin: 10.2 g/dL — ABNORMAL LOW (ref 13.0–17.0)
Immature Granulocytes: 2 %
Lymphocytes Relative: 21 %
Lymphs Abs: 0.7 K/uL (ref 0.7–4.0)
MCH: 27.8 pg (ref 26.0–34.0)
MCHC: 31.2 g/dL (ref 30.0–36.0)
MCV: 89.1 fL (ref 80.0–100.0)
Monocytes Absolute: 0.3 K/uL (ref 0.1–1.0)
Monocytes Relative: 10 %
Neutro Abs: 2 K/uL (ref 1.7–7.7)
Neutrophils Relative %: 60 %
Platelet Count: 147 K/uL — ABNORMAL LOW (ref 150–400)
RBC: 3.67 MIL/uL — ABNORMAL LOW (ref 4.22–5.81)
RDW: 14.4 % (ref 11.5–15.5)
WBC Count: 3.3 K/uL — ABNORMAL LOW (ref 4.0–10.5)
nRBC: 0 % (ref 0.0–0.2)

## 2024-06-01 LAB — RETIC PANEL
Immature Retic Fract: 9.5 % (ref 2.3–15.9)
RBC.: 3.62 MIL/uL — ABNORMAL LOW (ref 4.22–5.81)
Retic Count, Absolute: 43.8 K/uL (ref 19.0–186.0)
Retic Ct Pct: 1.2 % (ref 0.4–3.1)
Reticulocyte Hemoglobin: 31 pg (ref >27.9)

## 2024-06-01 LAB — IRON AND TIBC
Iron: 84 ug/dL (ref 45–182)
Saturation Ratios: 37 % (ref 17.9–39.5)
TIBC: 227 ug/dL — ABNORMAL LOW (ref 250–450)
UIBC: 143 ug/dL

## 2024-06-01 LAB — FERRITIN: Ferritin: 1148 ng/mL — ABNORMAL HIGH (ref 24–336)

## 2024-06-01 NOTE — Assessment & Plan Note (Signed)
 Status post radical nephrectomy.  Recent CT scan is negative. Further follow-up with urology.

## 2024-06-01 NOTE — Progress Notes (Signed)
 Hematology/Oncology Progress note [phone] [phone]            Patient Care Team: Carin Gauze, NP as PCP - General (Cardiology) Babara Call, MD as Consulting Physician (Hematology and Oncology)  ASSESSMENT & PLAN:   Anemia in chronic kidney disease (CKD) Chronic anemia, likely secondary to chronic kidney disease.  Recent drop of hemoglobin possibly secondary to radiation. Check CBC, smear, iron , TIBC ferritin, protein electrophoresis, reticulocyte panel, LDH and haptoglobin.  Lab Results  Component Value Date   HGB 10.2 (L) 06/01/2024   TIBC 239 (L) 09/21/2023   IRONPCTSAT 35 09/21/2023   FERRITIN 1,626 (H) 09/21/2023    Iron  panel is not consistent with iron  deficiency.  Elevated ferritin, possibly secondary to chronic inflammation. Hemoglobin is above 10, hold off Aranesp  Monitor hemoglobin every 4 weeks +/- Aranesp   Elevated ferritin Possibly due to chronic inflammation due to autoimmune disease.   Negative hemochromatosis mutation PCR.  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   Prostate cancer (HCC) Status post IMRT and 6 months of adjuvant deprivation therapy. Follow-up with urology and radiation oncology  Clear cell renal cell carcinoma, left (HCC) Status post radical nephrectomy.  Recent CT scan is negative. Further follow-up with urology.   Orders Placed This Encounter  Procedures   CBC with Differential (Cancer Center Only)    Standing Status:   Future    Expected Date:   09/21/2024    Expiration Date:   12/20/2024   Iron  and TIBC    Standing Status:   Future    Expected Date:   09/21/2024    Expiration Date:   12/20/2024   Ferritin    Standing Status:   Future    Expected  Date:   09/21/2024    Expiration Date:   12/20/2024   Retic Panel    Standing Status:   Future    Expected Date:   09/21/2024    Expiration Date:   12/20/2024   Hemoglobin and Hematocrit (Cancer Center Only)    Standing Status:   Standing    Number of Occurrences:   3    Expiration Date:   06/01/2025   Kappa/lambda light chains    Standing Status:   Future    Expected Date:   09/21/2024    Expiration Date:   06/01/2025   Follow-up per LOS All questions were answered. The patient knows to call the clinic with any problems, questions or concerns.  Call Babara, MD, PhD [facility] Hematology Oncology 06/01/2024   CHIEF COMPLAINTS/REASON FOR VISIT:  Anemia, clearance to resume  Orencia   HISTORY OF PRESENTING ILLNESS:   Michael Pileggi. is a  73 y.o.  male with PMH listed below was seen in consultation at the request of  Scoggins, Amber, NP  for evaluation of anemia and leukopenia.  Chronic leukopenia, baseline of total white count is around 3. Patient has history of rheumatoid arthritis.  Previously on Enbrel .  Recently he has been switched to Humira  and status post 1 dose.  He is currently taking prednisone  for side effects from Humira .  He has a history of low white blood cell count, anemia, and B12 deficiency. He was hospitalized in September 2023 for weight loss, chronic joint pain, and acute  kidney injury, during which he received two blood transfusions. His iron  levels were checked in January 2025, showing high levels, possibly influenced by rheumatoid arthritis. He has not previously received IV iron  infusions and is currently taking iron  pills.  He has a history of kidney cancer, for which he underwent nephrectomy on 07/13/2022. Pathology showed pallilary renal cell carcinoma. 07/18/2023  CT scan showed no evidence of cancer recurrence, Sclerotic focus in the right L4 vertebral body, possibly a bone island, can not rule out metastatic disease.   He was diagnosed with prostate cancer  in May 2024 [Gleason 3+4 as well as 4+3 lesion at the left lateral base and left lateral mid up to 37% of the tissue]. completed radiation therapy in November 2024. He received 6 months of ADT is currently taking Flomax .   He has a history of rheumatoid arthritis and was previously on Humira  and Enbrel . He was on Orencia , which was held while he was on Radiation.   He has a chronic history of anemia,  08/24/2023 - 04/28/2024 patient was hospitalized due to acute on chronic kidney failure, acute on chronic anemia.  Hemoglobin was 6.7 on admission status post PRBC transfusion during hospitalization.   INTERVAL HISTORY Michael Coufal. is a 73 y.o. male who has above history reviewed by me today presents  to re-establish care for anemia due to CKD. Patient tolerated erythropoietin replacement therapy.  Hemoglobin has improved. No new complains.   Review of Systems  Constitutional:  Positive for fatigue. Negative for appetite change, chills, fever and unexpected weight change.  HENT:   Negative for hearing loss and voice change.   Eyes:  Negative for eye problems and icterus.  Respiratory:  Negative for chest tightness, cough and shortness of breath.   Cardiovascular:  Negative for chest pain and leg swelling.  Gastrointestinal:  Negative for abdominal distention and abdominal pain.  Endocrine: Negative for hot flashes.  Genitourinary:  Negative for difficulty urinating, dysuria and frequency.   Musculoskeletal:  Positive for arthralgias.       Chronic joint deformity  Skin:  Negative for itching and rash.  Neurological:  Negative for light-headedness and numbness.  Hematological:  Negative for adenopathy. Does not bruise/bleed easily.  Psychiatric/Behavioral:  Negative for confusion.     MEDICAL HISTORY:  Past Medical History:  Diagnosis Date   Anemia    Aortic atherosclerosis    Atrial fibrillation (HCC)    a.) CHA2DS2VASc = 5 (age, HTN, CVA x 2, vascular disease history);  b.)  rate/rhythm maintained without pharmacological intervention; chronically anticoagulated with clopidogrel    Bilateral renal cysts 05/29/2022   Carotid artery disease 04/11/2022   a.) doppler 04/11/2022: <50 LICA, no sig RICA.   CVA (cerebral vascular accident) [facility])    a.) MRI brain 01/14/2022: numerous chronic cerebellar infarcts; b.) CT head 04/10/2022: small old lacunar infarcts are seen in cerebellum bilaterally   Diastolic dysfunction 04/11/2022   a.) TTE 04/11/2022: EF 55-60%, mild BAE, mild MR, G1DD.   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   a.) renal US  04/10/2022: solid heterogenous LEFT renal mass measuring 5.2 cm; b.) MRI ABD - 5.3 x 4.3 cm renal mass to posterior lip of LEFT kidney extending  into renal sinus; c.)  renal Bx  05/06/2022 - (+) for RCC with clear cell features   Rheumatoid arthritis involving multiple sites with positive rheumatoid factor (HCC) 06/02/2016   T2DM (type 2 diabetes mellitus) (HCC)    Thoracic ascending aortic aneurysm 06/01/2022   a.) CT chest - measured 4.2 cm    SURGICAL HISTORY: Past Surgical History:  Procedure Laterality Date   LAPAROSCOPIC NEPHRECTOMY, HAND ASSISTED Left 07/13/2022   Procedure: HAND ASSISTED LAPAROSCOPIC RADICAL NEPHRECTOMY;  Surgeon: Penne Knee, MD;  Location: [facility] ORS;  Service: Urology;  Laterality: Left;   RENAL BIOPSY  05/2022    SOCIAL HISTORY: Social History   Socioeconomic History   Marital status: Married    Spouse name: Felecia   Number of children: Not on file   Years of education: Not on file   Highest education level: Not on file  Occupational History   Not on file  Tobacco Use   Smoking status: Former    Current packs/day: 0.00    Average  packs/day: 0.5 packs/day for 7.0 years (3.5 ttl pk-yrs)    Types: Cigarettes    Start date: 03/16/1971    Quit date: 03/15/1978    Years since quitting: 46.2    Passive exposure: Past   Smokeless tobacco: Never  Vaping Use   Vaping status: Never Used  Substance and Sexual Activity   Alcohol use: Not Currently   Drug use: No   Sexual activity: Yes  Other Topics Concern   Not on file  Social History Narrative   Not on file   Social Drivers of Health   Financial Resource Strain: Not on file  Food Insecurity: No Food Insecurity (02/10/2024)   Hunger Vital Sign    Worried About Running Out of Food in the Last Year: Never true    Ran Out of Food in the Last Year: Never true  Transportation Needs: No Transportation Needs (08/24/2023)   PRAPARE - Administrator, Civil Service (Medical): No    Lack of Transportation (Non-Medical): No  Physical Activity: Not on file  Stress: Not on file  Social Connections: Socially Integrated (02/10/2024)   Social Connection and Isolation Panel    Frequency of Communication with Friends and Family: More than three times a week    Frequency of Social Gatherings with Friends and Family: More than three times a week    Attends Religious Services: More than 4 times per year    Active Member of [employer] or Organizations: Yes    Attends Engineer, structural: More than 4 times per year    Marital Status: Married  Catering manager Violence: Not At Risk (02/10/2024)   Humiliation, Afraid, Rape, and Kick questionnaire    Fear of Current or Ex-Partner: No    Emotionally Abused: No    Physically Abused: No    Sexually Abused: No    FAMILY HISTORY: Family History  Problem Relation Age of Onset   Heart disease Mother    Diabetes Mother    Breast cancer Mother    Kidney disease Father    Diabetes Father    Diabetes Daughter     ALLERGIES:  has no known allergies.  MEDICATIONS:  Current Outpatient Medications  Medication Sig Dispense Refill    Abatacept  (ORENCIA  CLICKJECT) 125 MG/ML SOAJ Inject 125 mg into the skin every 14 (fourteen) days. 4 mL 2   amLODipine  (NORVASC ) 10 MG tablet TAKE 1 TABLET BY MOUTH ONCE DAILY IN THE MORNING 90 tablet  0   Cholecalciferol (VITAMIN D -3) 125 MCG (5000 UT) TABS Take 1 tablet by mouth daily. 30 tablet 4   Ferrous Sulfate  (IRON  PO) Take 1 tablet by mouth daily.     folic acid  (FOLVITE ) 1 MG tablet Take 1 tablet (1 mg total) by mouth daily. 90 tablet 1   losartan  (COZAAR ) 50 MG tablet Take 1 tablet by mouth once daily 90 tablet 0   mirtazapine  (REMERON ) 7.5 MG tablet Take 1 tablet (7.5 mg total) by mouth at bedtime. 90 tablet 3   predniSONE  (DELTASONE ) 1 MG tablet Take 4 tablets (4 mg total) by mouth daily with breakfast. Take in the morning with breakfast. Do not take with NSAIDS. 120 tablet 0   predniSONE  (DELTASONE ) 5 MG tablet Take 1 tablet by mouth once daily with breakfast 30 tablet 0   rosuvastatin  (CRESTOR ) 40 MG tablet Take 1 tablet (40 mg total) by mouth daily. 100 tablet 0   tamsulosin  (FLOMAX ) 0.4 MG CAPS capsule Take 1 capsule (0.4 mg total) by mouth daily after supper. 90 capsule 4   triamcinolone  ointment (KENALOG ) 0.5 % Apply 1 Application topically 2 (two) times daily. (Patient not taking: Reported on 05/16/2024) 30 g 3   No current facility-administered medications for this visit.     PHYSICAL EXAMINATION: ECOG PERFORMANCE STATUS: 1 - Symptomatic but completely ambulatory Vitals:   06/01/24 1437  BP: 109/63  Pulse: 64  Resp: 18  Temp: (!) 97.1 F (36.2 C)  SpO2: 100%   Filed Weights   06/01/24 1437  Weight: 206 lb 11.2 oz (93.8 kg)    Physical Exam Constitutional:      General: He is not in acute distress. HENT:     Head: Normocephalic and atraumatic.  Eyes:     General: No scleral icterus. Cardiovascular:     Rate and Rhythm: Normal rate and regular rhythm.     Heart sounds: Normal heart sounds.  Pulmonary:     Effort: Pulmonary effort is normal. No  respiratory distress.     Breath sounds: No wheezing.  Abdominal:     General: Bowel sounds are normal. There is no distension.     Palpations: Abdomen is soft.  Musculoskeletal:     Cervical back: Normal range of motion and neck supple.     Comments: Range of motion of right knee is limited due to RA. Chronic joint swelling and deformity  Skin:    General: Skin is warm and dry.     Findings: No erythema or rash.  Neurological:     Mental Status: He is alert and oriented to person, place, and time. Mental status is at baseline.     Cranial Nerves: No cranial nerve deficit.     Coordination: Coordination normal.  Psychiatric:        Mood and Affect: Mood normal.     LABORATORY DATA:  I have reviewed the data as listed Lab Results  Component Value Date   WBC 3.3 (L) 06/01/2024   HGB 10.2 (L) 06/01/2024   HCT 32.7 (L) 06/01/2024   MCV 89.1 06/01/2024   PLT 147 (L) 06/01/2024   Recent Labs    08/24/23 1117 08/25/23 0538 09/21/23 1133 02/10/24 0951 02/23/24 1436 03/09/24 0956  NA 138   < >  --  137 140 137  K 4.4   < >  --  3.1* 4.1 3.8  CL 108   < >  --  113* 115* 114*  CO2 16*   < >  --  17* 18* 16*  GLUCOSE 87   < >  --  117* 116* 93  BUN 93*   < >  --  38* 53* 47*  CREATININE 6.60*   < >  --  2.28* 2.43* 2.16*  CALCIUM  9.4   < >  --  9.0 9.3 9.7  GFRNONAA 8*   < >  --  30* 28* 32*  PROT 7.8  --  7.1 6.9  --   --   ALBUMIN 3.5   < > 3.2* 3.3* 3.4*  --   AST 36  --  32 26  --   --   ALT 27  --  32 13  --   --   ALKPHOS 68  --  71 68  --   --   BILITOT 0.7  --  0.5 0.5  --   --   BILIDIR  --   --  0.1  --   --   --   IBILI  --   --  0.4  --   --   --    < > = values in this interval not displayed.   Iron /TIBC/Ferritin/ %Sat    Component Value Date/Time   IRON  84 06/01/2024 1427   TIBC 227 (L) 06/01/2024 1427   FERRITIN 1,148 (H) 06/01/2024 1427   IRONPCTSAT 37 06/01/2024 1427   IRONPCTSAT 37 03/06/2022 1342      RADIOGRAPHIC STUDIES: I have personally  reviewed the radiological images as listed and agreed with the findings in the report. No results found.

## 2024-06-01 NOTE — Assessment & Plan Note (Signed)
 Possibly due to chronic inflammation due to autoimmune disease.   Negative hemochromatosis mutation PCR.

## 2024-06-01 NOTE — Progress Notes (Signed)
 No aranesp  today per Provider

## 2024-06-01 NOTE — Assessment & Plan Note (Signed)
 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

## 2024-06-01 NOTE — Assessment & Plan Note (Signed)
 Status post IMRT and 6 months of adjuvant deprivation therapy. Follow-up with urology and radiation oncology

## 2024-06-01 NOTE — Assessment & Plan Note (Addendum)
 Chronic anemia, likely secondary to chronic kidney disease.  Recent drop of hemoglobin possibly secondary to radiation. Check CBC, smear, iron , TIBC ferritin, protein electrophoresis, reticulocyte panel, LDH and haptoglobin.  Lab Results  Component Value Date   HGB 10.2 (L) 06/01/2024   TIBC 239 (L) 09/21/2023   IRONPCTSAT 35 09/21/2023   FERRITIN 1,626 (H) 09/21/2023    Iron  panel is not consistent with iron  deficiency.  Elevated ferritin, possibly secondary to chronic inflammation. Hemoglobin is above 10, hold off Aranesp  Monitor hemoglobin every 4 weeks +/- Aranesp 

## 2024-06-08 ENCOUNTER — Other Ambulatory Visit (HOSPITAL_COMMUNITY): Payer: Self-pay

## 2024-06-08 ENCOUNTER — Telehealth: Payer: Self-pay

## 2024-06-08 NOTE — Telephone Encounter (Signed)
 Pt appears to have once again obtained new insurance. Received fax from [facility] plan stating that pt has been provided with a one-month supply of medication, however medication is not on the formulary.  Submitted a Prior Authorization request to [employer] for ORENCIA  SQ via CoverMyMeds. Will update once we receive a response.  Key: [id number]

## 2024-06-08 NOTE — Telephone Encounter (Signed)
 Received notification from [facility] regarding a prior authorization for ORENCIA  SQ. Authorization has been APPROVED from 06/08/2024 to 12/07/2024. Approval letter sent to scan center.  Authorization # [id number]

## 2024-06-22 ENCOUNTER — Other Ambulatory Visit: Payer: Self-pay

## 2024-06-26 ENCOUNTER — Other Ambulatory Visit: Payer: Self-pay

## 2024-06-28 ENCOUNTER — Other Ambulatory Visit: Payer: Self-pay | Admitting: Cardiology

## 2024-06-29 ENCOUNTER — Inpatient Hospital Stay: Attending: Oncology

## 2024-06-29 ENCOUNTER — Inpatient Hospital Stay

## 2024-06-29 VITALS — BP 124/66

## 2024-06-29 DIAGNOSIS — N1832 Chronic kidney disease, stage 3b: Secondary | ICD-10-CM | POA: Insufficient documentation

## 2024-06-29 DIAGNOSIS — D631 Anemia in chronic kidney disease: Secondary | ICD-10-CM | POA: Diagnosis present

## 2024-06-29 LAB — HEMOGLOBIN AND HEMATOCRIT (CANCER CENTER ONLY)
HCT: 29.1 % — ABNORMAL LOW (ref 39.0–52.0)
Hemoglobin: 9.4 g/dL — ABNORMAL LOW (ref 13.0–17.0)

## 2024-06-29 MED ORDER — DARBEPOETIN ALFA 150 MCG/0.3ML IJ SOSY
150.0000 ug | PREFILLED_SYRINGE | Freq: Once | INTRAMUSCULAR | Status: AC
  Administered 2024-06-29: 150 ug via SUBCUTANEOUS
  Filled 2024-06-29: qty 0.3

## 2024-07-11 ENCOUNTER — Other Ambulatory Visit: Payer: Self-pay

## 2024-07-11 ENCOUNTER — Ambulatory Visit
Admission: RE | Admit: 2024-07-11 | Discharge: 2024-07-11 | Disposition: A | Source: Ambulatory Visit | Attending: Urology

## 2024-07-11 DIAGNOSIS — C642 Malignant neoplasm of left kidney, except renal pelvis: Secondary | ICD-10-CM | POA: Insufficient documentation

## 2024-07-11 LAB — POCT I-STAT CREATININE: Creatinine, Ser: 2.2 mg/dL — ABNORMAL HIGH (ref 0.61–1.24)

## 2024-07-11 MED ORDER — IOHEXOL 300 MG/ML  SOLN
100.0000 mL | Freq: Once | INTRAMUSCULAR | Status: AC | PRN
  Administered 2024-07-11: 80 mL via INTRAVENOUS

## 2024-07-12 ENCOUNTER — Other Ambulatory Visit: Payer: Self-pay | Admitting: Cardiology

## 2024-07-12 DIAGNOSIS — I10 Essential (primary) hypertension: Secondary | ICD-10-CM

## 2024-07-17 ENCOUNTER — Other Ambulatory Visit: Payer: Self-pay

## 2024-07-17 DIAGNOSIS — C61 Malignant neoplasm of prostate: Secondary | ICD-10-CM

## 2024-07-18 ENCOUNTER — Other Ambulatory Visit (HOSPITAL_COMMUNITY): Payer: Self-pay

## 2024-07-20 ENCOUNTER — Other Ambulatory Visit: Payer: Self-pay

## 2024-07-20 NOTE — Progress Notes (Signed)
 Specialty Pharmacy Refill Coordination Note  Michael Hinton. is a 73 y.o. male contacted today regarding refills of specialty medication(s) Abatacept  (Orencia  [employer])   Patient requested Delivery   Delivery date: 08/08/24   Verified address: [address]   [place] [address]   Medication will be filled on: 08/07/24

## 2024-07-21 ENCOUNTER — Other Ambulatory Visit: Payer: Self-pay

## 2024-07-21 DIAGNOSIS — C61 Malignant neoplasm of prostate: Secondary | ICD-10-CM

## 2024-07-22 LAB — PSA: Prostate Specific Ag, Serum: <0.1 ng/mL (ref 0.0–4.0)

## 2024-07-24 ENCOUNTER — Ambulatory Visit: Admitting: Urology

## 2024-07-24 NOTE — Assessment & Plan Note (Signed)
 Intermediate-risk localized prostate Ca  - PSA 8-9 range (2024)  - MRI prostate (negative (Feb 2024)   - 12-core bx (May 2024) - GS 4+3 in 1/12, GS 3+4 in 1/12  - s/p IMRT + 6 mo ADT (Nov 2024)  Recent PSA from 07/21/2024 remains undetectable.  I also reviewed his interval CT scan, primarily for renal cell surveillance although noted to be stable and benign 5 mm sclerotic lesion within L4.  Overall he is doing very well from definitive cancer treatment.  Recommend continued every 6 month PSA for now, if stable through mid 70s may transition to annual surveillance.   - PSA in 6 mo, f/u with me in 1 year

## 2024-07-24 NOTE — Assessment & Plan Note (Signed)
 s/p Left HA radical nephrectomy (Dec 2023)   - Path = pT1b type 1 pap RCC, -sm  - CT A/P (Dec 2024) + CXR - NED  Interval CT A/P (07/18/2024) -NED  Reviewed his clinical history and recent CT imaging.  Radiographically, remains disease-free with no evidence of recurrence.  Solitary right kidney, with recent creatinine from August at 1.7-which is improved from prior baseline at 2-4 since nephrectomy.   - Recommend annual surveillance with CT A/P, CXR + BMP, next due December 2026

## 2024-07-24 NOTE — Progress Notes (Unsigned)
° °  07/26/2024 7:44 AM   Michael Hinton. [birth year] [id number]  Reason for visit: Follow up prostate Ca, left RCC   HPI: 73 y.o. male, initial follow up with me today, previously seen by Dr. Penne in Jan 2025  Prior HPI: Hx of prostate Ca  - PSA 8-9 range (2024)  - MRI prostate (negative (Feb 2024)   - 12-core bx (May 2024) - GS 4+3 in 1/12, GS 3+4 in 1/12  - s/p IMRT + 6 mo ADT (Nov 2024)  Hx of RCC  - s/p Left HA radical nephrectomy (Dec 2023)   - Path = pT1b type 1 pap RCC, -sm  - CT A/P (Dec 2024) + CXR - NED  Hx of BPH  - on Flomax    Physical Exam: There were no vitals taken for this visit.   Constitutional:  Alert and oriented, No acute distress.  Laboratory Data: Component Ref Range & Units (hover) 3 d ago (07/21/24) 1 yr ago (01/05/23) 1 yr ago (08/26/22) 2 yr ago (04/22/22)  Prostate Specific Ag, Serum <0.1 9.2 High  CM 8.1 High  CM 7.2 High  CM    Pertinent Imaging: I have personally viewed and interpreted the CT A/P w/ con (07/18/24)   IMPRESSION: 1. . No CT evidence of metastatic disease. 2. Stable 1.4 x 1.0 cm left adrenal nodule. 3. Left kidney surgically absent as before. 4. Right renal cysts unchanged with largest 2.8 cm parapelvic cyst; no solid renal mass suspected. 5. Advanced lumbar degenerative change with acquired spinal stenosis at L3-L4. 6. Stable 5 mm likely bone island in the L4 vertebral body. No new bone abnormality..    Assessment & Plan:    Prostate cancer [facility]) Assessment & Plan: Intermediate-risk localized prostate Ca  - PSA 8-9 range (2024)  - MRI prostate (negative (Feb 2024)   - 12-core bx (May 2024) - GS 4+3 in 1/12, GS 3+4 in 1/12  - s/p IMRT + 6 mo ADT (Nov 2024)  Recent PSA from 07/21/2024 remains undetectable.  I also reviewed his interval CT scan, primarily for renal cell surveillance although noted to be stable and benign 5 mm sclerotic lesion within L4.  Overall he is doing very well from definitive cancer  treatment.  Recommend continued every 6 month PSA for now, if stable through mid 70s may transition to annual surveillance.   - PSA in 6 mo, f/u with me in 1 year   Clear cell renal cell carcinoma, left [facility]) Assessment & Plan: s/p Left HA radical nephrectomy (Dec 2023)   - Path = pT1b type 1 pap RCC, -sm  - CT A/P (Dec 2024) + CXR - NED  Interval CT A/P (07/18/2024) -NED  Reviewed his clinical history and recent CT imaging.  Radiographically, remains disease-free with no evidence of recurrence.  Solitary right kidney, with recent creatinine from August at 1.7-which is improved from prior baseline at 2-4 since nephrectomy.   - Recommend annual surveillance with CT A/P, CXR + BMP, next due December 2026        Penne JONELLE Skye, MD  [facility] [address], [address] [place], [place] [address] [phone]

## 2024-07-26 ENCOUNTER — Encounter: Payer: Self-pay | Admitting: Urology

## 2024-07-26 ENCOUNTER — Ambulatory Visit: Admitting: Urology

## 2024-07-26 ENCOUNTER — Ambulatory Visit: Payer: Self-pay | Admitting: Urology

## 2024-07-26 VITALS — BP 135/73 | HR 82 | Ht 71.0 in | Wt 205.6 lb

## 2024-07-26 DIAGNOSIS — C642 Malignant neoplasm of left kidney, except renal pelvis: Secondary | ICD-10-CM

## 2024-07-26 DIAGNOSIS — C61 Malignant neoplasm of prostate: Secondary | ICD-10-CM

## 2024-07-26 NOTE — Progress Notes (Signed)
 Follow Up Visit   Patient Name: Michael Bennett., male   Patient DOB: Apr 26, 1951 Date of Service: 07/26/2024  Patient MRN: [id number] Provider Creating Note: Bonnell Sherry, MD  [phone] Primary Care Physician:   [address] [place] [address] Additional Physicians/ Providers:    History of Present Illness Michael Loveday. is a 73 y.o. male who is following up today for chronic kidney disease stage IIIb, left renal mass found to be renal cell carcinoma, and hypertension.  The patient's chronic kidney disease has fluctuated in the past several months.  Most recent eGFR was 42 with urine protein creatinine ratio 0.8.  Regards hypertension blood pressure currently 123/71.  He also has anemia of chronic kidney disease with most recent hemoglobin 10.3.  He did have recent CT scan follow-up which was negative for metastatic disease.  Medications   Current Outpatient Medications:    amLODIPine  (NORVASC ) 10 MG tablet, Take 10 mg by mouth every morning, Disp: , Rfl:    Cholecalciferol 125 MCG (5000 UT) tablet, Take 1 tablet by mouth 1 (one) time each day, Disp: , Rfl:    clopidogrel  (PLAVIX ) 75 MG tablet, Take 75 mg by mouth 1 (one) time each day, Disp: , Rfl:    folic acid  (FOLVITE ) 1 MG tablet, Take 1,000 mcg by mouth 1 (one) time each day, Disp: , Rfl:    predniSONE  5 MG tablet, TAKE 2 TABLETS BY MOUTH ONCE DAILY WITH BREAKFAST, Disp: , Rfl:    rosuvastatin  (CRESTOR ) 40 MG tablet, Take 40 mg by mouth 1 (one) time each day, Disp: , Rfl:    sodium bicarbonate  650 MG tablet, Take 650 mg by mouth in the morning and 650 mg in the evening., Disp: , Rfl:    Allergies Patient has no known allergies.  Problem List There is no problem list on file for this patient.    Review of Systems  Constitutional:  Negative for chills and fever.  Respiratory:  Negative for cough and shortness of breath.   Cardiovascular:  Negative for chest pain and palpitations.  Gastrointestinal:  Negative  for nausea and vomiting.  Genitourinary:  Negative for dysuria, hematuria and urgency.     History Past Medical History:  Diagnosis Date   Chronic kidney disease    Other malignant neoplasm of unspecified site (HCC)    Transient cerebral ischemia     History reviewed. No pertinent surgical history. Family History  Problem Relation Age of Onset   Hypertension Mother    Diabetes Mother    Cancer Mother    Kidney disease Father    Hypertension Father    Diabetes Father    Social History   Tobacco Use   Smoking status: Never   Smokeless tobacco: Not on file  Substance Use Topics   Alcohol use: Never        Physical Exam  Vitals BP 123/71 (BP Location: Left upper arm, Patient Position: Sitting)   Pulse 58   Temp 97.5 F   Wt 205 lb (93 kg)   SpO2 97%   BMI 28.59 kg/m   PHYSICAL EXAM: General appearance: well developed, well nourished, NAD Eyes: anicteric sclerae, moist conjunctivae; no lid-lag  HENT: Atraumatic; hearing intact Neck: Trachea midline; supple Lungs: CTAB, with normal respiratory effort  CV: S1S2, no murmurs or rubs. Abdomen: Soft, non-tender; bowel sounds present Extremities: 1+ right lower extremity edema Skin: Warm and dry, normal skin turgor, no rashes noted. Psych: Appropriate affect, alert and oriented to person,  place and time    Laboratory Studies  Chemistry  Lab Units 03/20/24 1014 02/17/24 1233 02/10/24 1328 10/05/23 1525 09/07/23 1314 08/19/23 1354 08/06/23 1047 04/05/23 1000 01/25/23 1039 12/01/22 1040 10/23/22 1031  SODIUM mmol/L 141 138 139 141 140 138 139 139 139   < > 139  POTASSIUM mmol/L 3.8 3.9 3.7 3.6 3.8 4.5 3.8 4.3 4   < > 3.9  CHLORIDE mmol/L 113* 109 111* 110 108 108 108 108 109   < > 108  CO2 mmol/L 19* 19* 20 21 22  15* 18* 23 19*   < > 20  CALCIUM  mg/dL 9.3 9.5 9.1 9.2 8.6 9.0 9.4 9.4 9.8   < > 10.0  PHOSPHORUS mg/dL 3.3 3.7 4.0 4.0 2.7 5.3*  --  3.1  --    < >  --   ALK PHOS U/L  --   --   --    --   --   --  92  --  98  --  86  PTH pg/mL 30  --  50 35 53 51  --  21  --    < >  --   GLUCOSE mg/dL 898* 88 898* 80 81 896* 83 89 77   < > 81  ALBUMIN g/dL 4.0 4.1 3.8 3.6 3.3* 3.4* 3.7 3.6 3.7   < > 3.6  BUN mg/dL 27* 44* 37* 30* 32* 75* 34* 30* 31*   < > 34*  CREATININE mg/dL 8.26* 7.38* 7.70* 8.28* 2.19* 8.13* 2.65* 1.88* 1.85*   < > 1.81*   < > = values in this interval not displayed.        No lab exists for component: IRON  SATURATION, TRANSSATPER  CBC  Lab Units 03/20/24 1014 02/10/24 1328 10/05/23 1525 09/07/23 1314 08/19/23 1354 04/05/23 1000 12/01/22 1040  WBC AUTO Thousand/uL 4.3 4.7 5.2 4.6 4.2 2.1* 4.6  HEMOGLOBIN g/dL 89.6* 9.5* 8.0* 8.1* 7.1* 10.2* 10.4*  HEMOGLOBIN URINE   --   --   --   --   --   --  NEGATIVE  HEMATOCRIT % 32.8* 30.1* 24.9* 24.5* 22.0* 30.8* 31.5*  MCV fL 86.8 83.8 86.2 85.1 85.3 84.8 82.2  PLATELETS AUTO Thousand/uL 134* 137* 186 160 193 173 181    Urine  Lab Units 03/20/24 1014 02/10/24 1328 10/05/23 1525 09/07/23 1314 08/19/23 1354 04/05/23 1000 12/01/22 1040 12/01/22 1040 08/04/22 1346  COLOR U   --   --   --   --   --   --   --  YELLOW  --   KETONES U MG/DL   --   --   --   --   --   --   --  NEGATIVE  --   PROT/CREAT RATIO UR mg/g creat 0.896*  896*  --   --   --  1.137*  1,137* 0.209*  209*   < > 0.407*  407*  --   ALB MG/G CREAT UR mg/g creat  --  172* 116* 15  --   --   --   --  31*   < > = values in this interval not displayed.    Lab Results  Component Value Date   PTH 30 03/20/2024   CALCIUM  9.3 03/20/2024   PHOS 3.3 03/20/2024     Imaging and Other Studies     Orders Placed This Encounter   Renal Function Panel   CBC and Differential   PTH, Intact   Protein,  Total, Random Urine w/Creatinine (Protein/Creat Ratio)        Impression/Recommendations  Michael Fulgham. is a 73 y.o. male with rheumatoid arthritis, hypertension, acute kidney injury 9/23, left renal cell carcinoma status post  hand-assisted left radical nephrectomy 07/13/2022, here for follow-up of acute kidney injury.  1.  Chronic kidney disease stage IIIb/proteinuria.  The patient had laparoscopic hand-assisted left radical nephrectomy performed on 07/13/2022.  Post nephrectomy creatinine was up to 1.46 with an EGFR 52.  Prior to surgery EGFR was 84.  Hospitalization for acute kidney injury 1/25.  Recurrent acute kidney injury 7/25 treated with IV fluids. - At last check most recent eGFR was 42 with urine protein creatinine ratio 0.8.  We plan to repeat renal parameters today.  I have encouraged him to maintain adequate hydration status and to avoid NSAIDs.  2.  Left renal mass/renal cell carcinoma.  Patient status post laparoscopic hand-assisted left radical nephrectomy performed on 07/13/2022.   - Patient with repeat imaging in December 2025 which was negative for recurrent cancer.  3.  Hypertension.  Blood pressure currently 123/71.  Maintain the patient on current dosage of amlodipine .  4.  Anemia of chronic kidney disease.  Hemoglobin 10.3 at last check.  No immediate need for Epogen .  Repeat CBC today.   Return in about 4 months (around 11/24/2024).   Michael Lateef, MD

## 2024-07-27 ENCOUNTER — Ambulatory Visit: Admitting: Urology

## 2024-07-27 ENCOUNTER — Inpatient Hospital Stay

## 2024-07-27 ENCOUNTER — Inpatient Hospital Stay: Attending: Oncology

## 2024-07-27 VITALS — BP 136/65 | HR 67

## 2024-07-27 DIAGNOSIS — N1832 Chronic kidney disease, stage 3b: Secondary | ICD-10-CM | POA: Diagnosis present

## 2024-07-27 DIAGNOSIS — D631 Anemia in chronic kidney disease: Secondary | ICD-10-CM | POA: Diagnosis present

## 2024-07-27 LAB — HEMOGLOBIN AND HEMATOCRIT (CANCER CENTER ONLY)
HCT: 29.4 % — ABNORMAL LOW (ref 39.0–52.0)
Hemoglobin: 9.2 g/dL — ABNORMAL LOW (ref 13.0–17.0)

## 2024-07-27 MED ORDER — DARBEPOETIN ALFA 150 MCG/0.3ML IJ SOSY
150.0000 ug | PREFILLED_SYRINGE | Freq: Once | INTRAMUSCULAR | Status: AC
  Administered 2024-07-27: 14:00:00 150 ug via SUBCUTANEOUS
  Filled 2024-07-27: qty 0.3

## 2024-08-01 ENCOUNTER — Encounter: Payer: Self-pay | Admitting: Oncology

## 2024-08-02 ENCOUNTER — Other Ambulatory Visit: Payer: Self-pay | Admitting: Physician Assistant

## 2024-08-03 NOTE — Progress Notes (Unsigned)
 "  Office Visit Note  Patient: Michael Hinton.             Date of Birth: 22-Jul-1951           MRN: [id number]             PCP: Carin Gauze, NP Referring: Carin Gauze, NP Visit Date: 08/16/2024 Occupation: Data Unavailable  Subjective:  Medication monitoring  History of Present Illness: Michael Hinton. is a 73 y.o. male with history of seropositive rheumatoid arthritis.  Patient remains on orencia  125 mg sq injections every 14 days.  He is taking prednisone  3 mg daily.  He is tolerating Orencia  without any side effects and has not had any gaps in therapy.  The dose of prednisone  was reduced from 4 tablets to 3 tablets weekly on 08/04/2024.  He has not noticed any new or worsening symptoms on the reduced dose of prednisone .  He denies any joint swelling at this time.  He continues to have soreness and stiffness affecting both hands lasting several hours every morning.  He denies any new joint involvement.  He denies any recent or recurrent infections.  He denies any new medical conditions.    Activities of Daily Living:  Patient reports morning stiffness for 1/2 a day.   Patient Denies nocturnal pain.  Difficulty dressing/grooming: Denies Difficulty climbing stairs: Denies Difficulty getting out of chair: Denies Difficulty using hands for taps, buttons, cutlery, and/or writing: Denies  Review of Systems  Constitutional:  Negative for fatigue.  HENT:  Negative for mouth sores and mouth dryness.   Eyes:  Negative for dryness.  Respiratory:  Negative for shortness of breath.   Cardiovascular:  Negative for chest pain and palpitations.  Gastrointestinal:  Negative for blood in stool, constipation and diarrhea.  Endocrine: Positive for increased urination.  Genitourinary:  Negative for involuntary urination.  Musculoskeletal:  Positive for morning stiffness. Negative for joint pain, gait problem, joint pain, joint swelling, myalgias, muscle weakness, muscle tenderness and  myalgias.  Skin:  Negative for color change, rash, hair loss and sensitivity to sunlight.  Allergic/Immunologic: Negative for susceptible to infections.  Neurological:  Negative for dizziness and headaches.  Hematological:  Negative for swollen glands.  Psychiatric/Behavioral:  Negative for depressed mood and sleep disturbance. The patient is not nervous/anxious.     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   Weakness    AKI (acute kidney injury)    Anemia in chronic kidney disease (CKD)    Dysphagia    Unintentional weight loss    Hypercalcemia    Renal mass 04/10/2022   Vitamin B12 deficiency 11/04/2021   DDD (degenerative disc disease), lumbar 03/11/2018   ANA positive 01/22/2017   Leucopenia 01/22/2017   Contracture of elbow 08/23/2016   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   Rheumatoid arthritis involving multiple sites with positive rheumatoid factor (HCC) 06/02/2016   High risk medication use 06/02/2016   Essential hypertension 06/02/2016   Vitamin D  deficiency 06/02/2016    Past Medical History:  Diagnosis Date   Anemia    Aortic atherosclerosis    Atrial  fibrillation (HCC)    a.) CHA2DS2VASc = 5 (age, HTN, CVA x 2, vascular disease history);  b.) rate/rhythm maintained without pharmacological intervention; chronically anticoagulated with clopidogrel    Bilateral renal cysts 05/29/2022   Carotid artery disease 04/11/2022   a.) doppler 04/11/2022: <50 LICA, no sig RICA.   CVA (cerebral vascular accident) [facility])    a.) MRI brain 01/14/2022: numerous chronic cerebellar infarcts;  b.) CT head 04/10/2022: small old lacunar infarcts are seen in cerebellum bilaterally   Diastolic dysfunction 04/11/2022   a.) TTE 04/11/2022: EF 55-60%, mild BAE, mild MR, G1DD.   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   T2DM (type 2 diabetes mellitus) (HCC)    Thoracic ascending aortic aneurysm 06/01/2022   a.) CT chest - measured 4.2 cm    Family History  Problem Relation Age of Onset   Heart disease Mother    Diabetes Mother    Breast cancer Mother    Kidney disease Father    Diabetes Father    Diabetes Daughter    Past Surgical History:  Procedure Laterality Date   LAPAROSCOPIC NEPHRECTOMY, HAND ASSISTED Left 07/13/2022   Procedure: HAND ASSISTED LAPAROSCOPIC RADICAL NEPHRECTOMY;  Surgeon: Penne Knee, MD;  Location: [facility] ORS;  Service: Urology;  Laterality: Left;   RENAL BIOPSY  05/2022   Social History[1] Social History   Social History Narrative   Not on file     Immunization History  Administered Date(s) Administered   Moderna Sars-Covid-2 Vaccination 10/12/2019, 11/15/2019   PPD Test 03/20/2022, 08/23/2023   Pneumococcal Conjugate-13 07/13/2018     Objective: Vital Signs: BP 122/71   Pulse 66   Temp 97.9 F (36.6 C)   Resp 16   Ht 5' 11 (1.803 m)   Wt 197 lb 6.4 oz (89.5 kg)   BMI 27.53 kg/m    Physical Exam Vitals and nursing note reviewed.  Constitutional:       Appearance: He is well-developed.  HENT:     Head: Normocephalic and atraumatic.  Eyes:     Conjunctiva/sclera: Conjunctivae normal.     Pupils: Pupils are equal, round, and reactive to light.  Cardiovascular:     Rate and Rhythm: Normal rate and regular rhythm.     Heart sounds: Normal heart sounds.  Pulmonary:     Effort: Pulmonary effort is normal.     Breath sounds: Normal breath sounds.  Abdominal:     General: Bowel sounds are normal.     Palpations: Abdomen is soft.  Musculoskeletal:     Cervical back: Normal range of motion and neck supple.  Skin:    General: Skin is warm and dry.     Capillary Refill: Capillary refill takes less than 2 seconds.  Neurological:     Mental Status: He is alert and oriented to person, place, and time.  Psychiatric:  Behavior: Behavior normal.      Musculoskeletal Exam: C-spine limited ROM with lateral rotation.  Thoracic kyphosis noted.  Shoulder joints have limited internal rotation and abduction bilaterally.  Flexion contractures of both elbows.  Limited range of motion of both wrist joints with synovial thickening.  Synovial thickening of all MCP joints with ulnar deviation.  Limited extension of 3rd, 4th and 5th digits bilaterally.  Limited flexion and extension of the right knee.  Synovial thickening of both knees and both ankle joints.  Subluxation of both ankles.  CDAI Exam: CDAI Score: -- Patient Global: --; Provider Global: -- Swollen: --; Tender: -- Joint Exam 08/16/2024   No joint exam has been documented for this visit   There is currently no information documented on the homunculus. Go to the Rheumatology activity and complete the homunculus joint exam.  Investigation: No additional findings.  Imaging: No results found.   Recent Labs: Lab Results  Component Value Date   WBC 3.3 (L) 06/01/2024   HGB 9.2 (L) 07/27/2024   PLT 147 (L) 06/01/2024   NA 137 03/09/2024   K 3.8 03/09/2024   CL 114 (H) 03/09/2024    CO2 16 (L) 03/09/2024   GLUCOSE 93 03/09/2024   BUN 47 (H) 03/09/2024   CREATININE 2.20 (H) 07/11/2024   BILITOT 0.5 02/10/2024   ALKPHOS 68 02/10/2024   AST 26 02/10/2024   ALT 13 02/10/2024   PROT 6.9 02/10/2024   ALBUMIN 3.4 (L) 02/23/2024   CALCIUM  9.7 03/09/2024   GFRAA 86 02/03/2021   QFTBGOLD NEGATIVE 06/16/2017   QFTBGOLDPLUS INDETERMINATE (A) 08/06/2023    Speciality Comments: TB Skin Test 08/23/2023: Negative  Procedures:  No procedures performed Allergies: Patient has no known allergies.      Assessment / Plan:     Visit Diagnoses: Rheumatoid arthritis involving multiple sites with positive rheumatoid factor [facility]): Patient continues to experience soreness and stiffness affecting both hands.  He has not noticed any joint swelling.  He has not noticed any new or worsening symptoms since reducing the dose of prednisone  from 4 mg to 3 mg daily as of 08/04/2024.  He remains on Orencia  125 mg sq injections once every 14 days.  He is tolerating Orencia  without any side effects or injection site reactions.  He has not had any recent or recurrent infections.  No medication changes will be made at this time. He was advised to notify us  if he develops any new or worsening symptoms.  He will follow up in 5 months or sooner if needed.   High risk medication use: Orencia  125 mg sq injections every 14 days. Plan to try to continue prednisone  1 mg every 2 months.  He resumed Orencia  in April 2025. Orencia  was previously started on 10/28/22--He was spacing orencia  by every 14 days due to history of neutropenia. Resumed orencia  once cleared by hem/onc. Patient remains anemic likely secondary to chronic kidney disease.  PPD negative on 08/26/2023. Lipid panel updated on 05/01/24 Hgb A1c updated on 05/01/24 Due for PPD CBC and renal function panel on 07/26/24.   Long term systemic steroid user - Hgb A1c updated on 05/01/24. DEXA within normal limits on 03/15/2024. Prednisone  3 mg daily-reduced  dose from 4 mg to 3 mg on 08/04/24.  Plan to continue to reduce prednisone  by 1 mg every 2 months.   Other drug-induced neutropenia - Previously on Enbrel -was previously spacing dose. Under care of Dr. 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 WBC count 4.3-absolute  lymphocytes low-512 on 07/26/24. Plan to continue to monitor closely.    Renal cell adenoma, left: Incidental finding while hospitalized 9/1 to 04/14/2022.  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 Patient was admitted on 07/13/2022 for scheduled hand-assisted laparoscopic left radical nephrectomy with Dr. Penne for management.   CT abdomen and pelvis 07/14/2023: Status post left nephrectomy without evidence of local recurrence or convincing evidence of metastatic disease in the abdomen or pelvis. Renal ultrasound performed on 08/24/2023: Surgically absent left kidney.  Increased right renal cortical agenesis that he, nonspecific but commonly seen in medical renal disease.  3.1 cm simple cyst in right kidney lower pole noted. CT abdomen and pelvis updated on 07/11/2024 revealed no evidence of metastatic disease.  Right renal cyst unchanged.  Prostate cancer (HCC) - (Stage II, Gleason 7 (4+3), T1C, N0, M0).  Completed IMRT radiation tx.Under care of Dr. Samson excellent biochemical control of prostate cancer.  Contractures of both knees: Limited flexion and extension of the right knee, more severe than left.  Synovial thickening but no effusion noted.   Contracture of left elbow: Flexion contracture.   Contracture of right elbow: Flexion contracture.   Vitamin D  deficiency - DEXA within normal limits on 03/15/2024.  Vitamin D  was 44.7 on 05/01/2024.  Primary hypertension: BP 122/71 today in the office.   Spinal stenosis of lumbar region without neurogenic claudication:  Limited mobility.     ANA positive: No clinical features of systemic lupus.   Orders: No orders of the defined types were placed in this encounter.  No orders of the defined types were placed in this encounter.    Follow-Up Instructions: Return in about 5 months (around 01/14/2025) for Rheumatoid arthritis.   Waddell CHRISTELLA Craze, PA-C  Note - This record has been created using Dragon software.  Chart creation errors have been sought, but may not always  have been located. Such creation errors do not reflect on  the standard of medical care.     [1]  Social History Tobacco Use   Smoking status: Former    Current packs/day: 0.00    Average packs/day: 0.5 packs/day for 7.0 years (3.5 ttl pk-yrs)    Types: Cigarettes    Start date: 03/16/1971    Quit date: 03/15/1978    Years since quitting: 46.4    Passive exposure: Past   Smokeless tobacco: Never  Vaping Use   Vaping status: Never Used  Substance Use Topics   Alcohol use: Not Currently   Drug use: No   "

## 2024-08-04 ENCOUNTER — Ambulatory Visit

## 2024-08-04 NOTE — Telephone Encounter (Signed)
 Last Fill: 05/16/2024  Next Visit: 08/16/2024  Last Visit: 05/16/2024  Dx:  Rheumatoid arthritis involving multiple sites with positive rheumatoid factor    Current Dose per office note on 05/16/2024: Plan to try reducing prednisone  by 1 mg every 2 months as tolerated.   Okay to refill Prednisone ?   Left message to confirm dosage amount.   Adjusted sig to 3 mg since 2 months ago patient was taking 4 mg.

## 2024-08-07 ENCOUNTER — Other Ambulatory Visit: Payer: Self-pay

## 2024-08-10 ENCOUNTER — Encounter: Payer: Self-pay | Admitting: Oncology

## 2024-08-16 ENCOUNTER — Ambulatory Visit: Attending: Physician Assistant | Admitting: Physician Assistant

## 2024-08-16 ENCOUNTER — Encounter: Payer: Self-pay | Admitting: Physician Assistant

## 2024-08-16 ENCOUNTER — Other Ambulatory Visit: Payer: Self-pay | Admitting: Oncology

## 2024-08-16 ENCOUNTER — Encounter: Payer: Self-pay | Admitting: Oncology

## 2024-08-16 VITALS — BP 122/71 | HR 66 | Temp 97.9°F | Resp 16 | Ht 71.0 in | Wt 197.4 lb

## 2024-08-16 DIAGNOSIS — D3002 Benign neoplasm of left kidney: Secondary | ICD-10-CM | POA: Diagnosis not present

## 2024-08-16 DIAGNOSIS — I10 Essential (primary) hypertension: Secondary | ICD-10-CM

## 2024-08-16 DIAGNOSIS — M24521 Contracture, right elbow: Secondary | ICD-10-CM | POA: Diagnosis not present

## 2024-08-16 DIAGNOSIS — E559 Vitamin D deficiency, unspecified: Secondary | ICD-10-CM

## 2024-08-16 DIAGNOSIS — M48061 Spinal stenosis, lumbar region without neurogenic claudication: Secondary | ICD-10-CM

## 2024-08-16 DIAGNOSIS — Z79899 Other long term (current) drug therapy: Secondary | ICD-10-CM

## 2024-08-16 DIAGNOSIS — M24561 Contracture, right knee: Secondary | ICD-10-CM

## 2024-08-16 DIAGNOSIS — C61 Malignant neoplasm of prostate: Secondary | ICD-10-CM

## 2024-08-16 DIAGNOSIS — M0579 Rheumatoid arthritis with rheumatoid factor of multiple sites without organ or systems involvement: Secondary | ICD-10-CM | POA: Diagnosis not present

## 2024-08-16 DIAGNOSIS — Z7952 Long term (current) use of systemic steroids: Secondary | ICD-10-CM | POA: Diagnosis not present

## 2024-08-16 DIAGNOSIS — M24522 Contracture, left elbow: Secondary | ICD-10-CM | POA: Diagnosis not present

## 2024-08-16 DIAGNOSIS — R7689 Other specified abnormal immunological findings in serum: Secondary | ICD-10-CM

## 2024-08-16 DIAGNOSIS — D702 Other drug-induced agranulocytosis: Secondary | ICD-10-CM | POA: Diagnosis not present

## 2024-08-16 DIAGNOSIS — M24562 Contracture, left knee: Secondary | ICD-10-CM

## 2024-08-22 ENCOUNTER — Encounter: Payer: Self-pay | Admitting: Oncology

## 2024-08-23 ENCOUNTER — Encounter: Payer: Self-pay | Admitting: Oncology

## 2024-08-24 ENCOUNTER — Inpatient Hospital Stay: Payer: Self-pay | Attending: Oncology

## 2024-08-24 ENCOUNTER — Inpatient Hospital Stay

## 2024-08-24 DIAGNOSIS — D631 Anemia in chronic kidney disease: Secondary | ICD-10-CM | POA: Diagnosis present

## 2024-08-24 DIAGNOSIS — N1832 Chronic kidney disease, stage 3b: Secondary | ICD-10-CM | POA: Insufficient documentation

## 2024-08-24 LAB — HEMOGLOBIN AND HEMATOCRIT (CANCER CENTER ONLY)
HCT: 32.9 % — ABNORMAL LOW (ref 39.0–52.0)
Hemoglobin: 10.3 g/dL — ABNORMAL LOW (ref 13.0–17.0)

## 2024-08-24 NOTE — Progress Notes (Signed)
 Hgb 10.3 today, no inj given

## 2024-08-29 ENCOUNTER — Ambulatory Visit: Admitting: Cardiology

## 2024-08-29 ENCOUNTER — Encounter: Payer: Self-pay | Admitting: Oncology

## 2024-08-29 ENCOUNTER — Encounter: Payer: Self-pay | Admitting: Cardiology

## 2024-08-29 VITALS — BP 116/64 | HR 73 | Ht 71.0 in | Wt 198.8 lb

## 2024-08-29 DIAGNOSIS — Z131 Encounter for screening for diabetes mellitus: Secondary | ICD-10-CM | POA: Diagnosis not present

## 2024-08-29 DIAGNOSIS — Z111 Encounter for screening for respiratory tuberculosis: Secondary | ICD-10-CM

## 2024-08-29 DIAGNOSIS — N1832 Chronic kidney disease, stage 3b: Secondary | ICD-10-CM

## 2024-08-29 DIAGNOSIS — E782 Mixed hyperlipidemia: Secondary | ICD-10-CM | POA: Diagnosis not present

## 2024-08-29 DIAGNOSIS — I10 Essential (primary) hypertension: Secondary | ICD-10-CM | POA: Diagnosis not present

## 2024-08-29 DIAGNOSIS — N179 Acute kidney failure, unspecified: Secondary | ICD-10-CM | POA: Diagnosis not present

## 2024-08-29 NOTE — Progress Notes (Signed)
 "  Established Patient Office Visit  Subjective:  Patient ID: Michael Darroch., male    DOB: 03-Jan-1951  Age: 74 y.o. MRN: [id number]  Chief Complaint  Patient presents with   Follow-up    4 month lab results. Rheumatologist is requesting a TB skin test.    Patient in office for 4 month follow up, discuss recent lab results. Patient dong well, no complaints today. Did not lab work done, will do today. Rheumatology requesting confirmation patient does not have TB, will do lab work. Blood pressure controlled today. Continue current medications.    No other concerns at this time.   Past Medical History:  Diagnosis Date   Anemia    Aortic atherosclerosis    Atrial fibrillation (HCC)    a.) CHA2DS2VASc = 5 (age, HTN, CVA x 2, vascular disease history);  b.) rate/rhythm maintained without pharmacological intervention; chronically anticoagulated with clopidogrel    Bilateral renal cysts 05/29/2022   Carotid artery disease 04/11/2022   a.) doppler 04/11/2022: <50 LICA, no sig RICA.   CVA (cerebral vascular accident) [facility])    a.) MRI brain 01/14/2022: numerous chronic cerebellar infarcts; b.) CT head 04/10/2022: small old lacunar infarcts are seen in cerebellum bilaterally   Diastolic dysfunction 04/11/2022   a.) TTE 04/11/2022: EF 55-60%, mild BAE, mild MR, G1DD.   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   a.) renal US  04/10/2022: solid heterogenous LEFT renal mass measuring 5.2 cm; b.) MRI ABD - 5.3 x 4.3 cm renal mass to posterior lip of LEFT kidney extending into renal sinus; c.)  renal Bx  05/06/2022 - (+) for RCC with clear cell features   Rheumatoid arthritis involving multiple sites with positive rheumatoid factor (HCC)  06/02/2016   T2DM (type 2 diabetes mellitus) (HCC)    Thoracic ascending aortic aneurysm 06/01/2022   a.) CT chest - measured 4.2 cm    Past Surgical History:  Procedure Laterality Date   LAPAROSCOPIC NEPHRECTOMY, HAND ASSISTED Left 07/13/2022   Procedure: HAND ASSISTED LAPAROSCOPIC RADICAL NEPHRECTOMY;  Surgeon: Penne Knee, MD;  Location: [facility] ORS;  Service: Urology;  Laterality: Left;   RENAL BIOPSY  05/2022    Social History   Socioeconomic History   Marital status: Married    Spouse name: Felecia   Number of children: Not on file   Years of education: Not on file   Highest education level: Not on file  Occupational History   Not on file  Tobacco Use   Smoking status: Former    Current packs/day: 0.00    Average packs/day: 0.5 packs/day for 7.0 years (3.5 ttl pk-yrs)    Types: Cigarettes    Start date: 03/16/1971    Quit date: 03/15/1978    Years since quitting: 46.4    Passive exposure: Past   Smokeless tobacco: Never  Vaping Use   Vaping status: Never Used  Substance and Sexual Activity   Alcohol use: Not Currently   Drug use: No   Sexual activity: Yes  Other Topics Concern   Not on file  Social History Narrative   Not on file   Social Drivers of Health   Tobacco Use: Medium Risk (08/29/2024)   Patient History  Smoking Tobacco Use: Former    Smokeless Tobacco Use: Never    Passive Exposure: Past  Programmer, Applications: Not on file  Food Insecurity: No Food Insecurity (02/10/2024)   Epic    Worried About Programme Researcher, Broadcasting/film/video in the Last Year: Never true    Ran Out of Food in the Last Year: Never true  Transportation Needs: No Transportation Needs (08/24/2023)   PRAPARE - Administrator, Civil Service (Medical): No    Lack of Transportation (Non-Medical): No  Physical Activity: Not on file  Stress: Not on file  Social Connections: Socially Integrated (02/10/2024)   Social Connection and Isolation Panel    Frequency of Communication with  Friends and Family: More than three times a week    Frequency of Social Gatherings with Friends and Family: More than three times a week    Attends Religious Services: More than 4 times per year    Active Member of [employer] or Organizations: Yes    Attends Banker Meetings: More than 4 times per year    Marital Status: Married  Catering Manager Violence: Not At Risk (02/10/2024)   Epic    Fear of Current or Ex-Partner: No    Emotionally Abused: No    Physically Abused: No    Sexually Abused: No  Depression (PHQ2-9): Low Risk (06/01/2024)   Depression (PHQ2-9)    PHQ-2 Score: 0  Alcohol Screen: Low Risk (02/10/2024)   Alcohol Screen    Last Alcohol Screening Score (AUDIT): 0  Housing: Low Risk (02/10/2024)   Epic    Unable to Pay for Housing in the Last Year: No    Number of Times Moved in the Last Year: 0    Homeless in the Last Year: No  Utilities: Not At Risk (02/10/2024)   Epic    Threatened with loss of utilities: No  Health Literacy: Adequate Health Literacy (02/10/2024)   B1300 Health Literacy    Frequency of need for help with medical instructions: Never    Family History  Problem Relation Age of Onset   Heart disease Mother    Diabetes Mother    Breast cancer Mother    Kidney disease Father    Diabetes Father    Diabetes Daughter     Allergies[1]  Show/hide medication list[2]  Review of Systems  Constitutional: Negative.   HENT: Negative.    Eyes: Negative.   Respiratory: Negative.  Negative for shortness of breath.   Cardiovascular: Negative.  Negative for chest pain.  Gastrointestinal: Negative.  Negative for abdominal pain, constipation and diarrhea.  Genitourinary: Negative.   Musculoskeletal:  Negative for joint pain and myalgias.  Skin: Negative.   Neurological: Negative.  Negative for dizziness and headaches.  Endo/Heme/Allergies: Negative.   All other systems reviewed and are negative.      Objective:   BP 116/64   Pulse 73   Ht 5' 11  (1.803 m)   Wt 198 lb 12.8 oz (90.2 kg)   SpO2 97%   BMI 27.73 kg/m   Vitals:   08/29/24 1411  BP: 116/64  Pulse: 73  Height: 5' 11 (1.803 m)  Weight: 198 lb 12.8 oz (90.2 kg)  SpO2: 97%  BMI (Calculated): 27.74    Physical Exam Nursing note reviewed.  Constitutional:      Appearance: Normal appearance. He is normal weight.  HENT:     Head: Normocephalic and atraumatic.     Nose: Nose normal.  Mouth/Throat:     Mouth: Mucous membranes are moist.     Pharynx: Oropharynx is clear.  Eyes:     Extraocular Movements: Extraocular movements intact.     Conjunctiva/sclera: Conjunctivae normal.     Pupils: Pupils are equal, round, and reactive to light.  Cardiovascular:     Rate and Rhythm: Normal rate and regular rhythm.     Pulses: Normal pulses.     Heart sounds: Normal heart sounds.  Pulmonary:     Effort: Pulmonary effort is normal.     Breath sounds: Normal breath sounds.  Abdominal:     General: Abdomen is flat. Bowel sounds are normal.     Palpations: Abdomen is soft.  Musculoskeletal:        General: Normal range of motion.     Cervical back: Normal range of motion.  Skin:    General: Skin is warm and dry.  Neurological:     General: No focal deficit present.     Mental Status: He is alert and oriented to person, place, and time.  Psychiatric:        Mood and Affect: Mood normal.        Behavior: Behavior normal.        Thought Content: Thought content normal.        Judgment: Judgment normal.      No results found for any visits on 08/29/24.  Recent Results (from the past 2160 hours)  Retic Panel     Status: Abnormal   Collection Time: 06/01/24  2:27 PM  Result Value Ref Range   Retic Ct Pct 1.2 0.4 - 3.1 %   RBC. 3.62 (L) 4.22 - 5.81 MIL/uL   Retic Count, Absolute 43.8 19.0 - 186.0 K/uL   Immature Retic Fract 9.5 2.3 - 15.9 %   Reticulocyte Hemoglobin 31.0 >27.9 pg    Comment: Performed at [facility], [address] [place],  [place] [address]  Ferritin     Status: Abnormal   Collection Time: 06/01/24  2:27 PM  Result Value Ref Range   Ferritin 1,148 (H) 24 - 336 ng/mL    Comment: Performed at [facility], [address] [place], [place] [address]  Iron  and TIBC     Status: Abnormal   Collection Time: 06/01/24  2:27 PM  Result Value Ref Range   Iron  84 45 - 182 ug/dL   TIBC 772 (L) 749 - 549 ug/dL   Saturation Ratios 37 17.9 - 39.5 %   UIBC 143 ug/dL    Comment: Performed at [facility], [address] [place], [place] [address]  CBC with Differential (Cancer Center Only)     Status: Abnormal   Collection Time: 06/01/24  2:27 PM  Result Value Ref Range   WBC Count 3.3 (L) 4.0 - 10.5 K/uL   RBC 3.67 (L) 4.22 - 5.81 MIL/uL   Hemoglobin 10.2 (L) 13.0 - 17.0 g/dL   HCT 67.2 (L) 60.9 - 47.9 %   MCV 89.1 80.0 - 100.0 fL   MCH 27.8 26.0 - 34.0 pg   MCHC 31.2 30.0 - 36.0 g/dL   RDW 85.5 88.4 - 84.4 %   Platelet Count 147 (L) 150 - 400 K/uL   nRBC 0.0 0.0 - 0.2 %   Neutrophils Relative % 60 %   Neutro Abs 2.0 1.7 - 7.7 K/uL   Lymphocytes Relative 21 %   Lymphs Abs 0.7 0.7 - 4.0 K/uL   Monocytes Relative 10 %  Monocytes Absolute 0.3 0.1 - 1.0 K/uL   Eosinophils Relative 7 %   Eosinophils Absolute 0.2 0.0 - 0.5 K/uL   Basophils Relative 0 %   Basophils Absolute 0.0 0.0 - 0.1 K/uL   Immature Granulocytes 2 %   Abs Immature Granulocytes 0.05 0.00 - 0.07 K/uL    Comment: Performed at [facility], [address] [place], [place] [address]  Hemoglobin and Hematocrit (Cancer Center Only)     Status: Abnormal   Collection Time: 06/29/24  1:46 PM  Result Value Ref Range   Hemoglobin 9.4 (L) 13.0 - 17.0 g/dL   HCT 70.8 (L) 60.9 - 47.9 %    Comment: Performed at [facility], [address] [place], [place] [address]  I-STAT creatinine     Status: Abnormal   Collection Time: 07/11/24 11:34 AM  Result Value Ref Range   Creatinine, Ser 2.20 (H) 0.61 - 1.24 mg/dL  PSA     Status:  None   Collection Time: 07/21/24 11:08 AM  Result Value Ref Range   Prostate Specific Ag, Serum <0.1 0.0 - 4.0 ng/mL    Comment: Roche ECLIA methodology. According to the American Urological Association, Serum PSA should decrease and remain at undetectable levels after radical prostatectomy. The AUA defines biochemical recurrence as an initial PSA value 0.2 ng/mL or greater followed by a subsequent confirmatory PSA value 0.2 ng/mL or greater. Values obtained with different assay methods or kits cannot be used interchangeably. Results cannot be interpreted as absolute evidence of the presence or absence of malignant disease.   Hemoglobin and Hematocrit (Cancer Center Only)     Status: Abnormal   Collection Time: 07/27/24  1:37 PM  Result Value Ref Range   Hemoglobin 9.2 (L) 13.0 - 17.0 g/dL   HCT 70.5 (L) 60.9 - 47.9 %    Comment: Performed at [facility], [address] [place], [place] [address]  Hemoglobin and Hematocrit (Cancer Center Only)     Status: Abnormal   Collection Time: 08/24/24  1:22 PM  Result Value Ref Range   Hemoglobin 10.3 (L) 13.0 - 17.0 g/dL   HCT 67.0 (L) 60.9 - 47.9 %    Comment: Performed at [facility], [address] [place], [place] [address]      Assessment & Plan:  Labs today Continue current medications  Problem List Items Addressed This Visit       Cardiovascular and Mediastinum   Essential hypertension - Primary   Relevant Orders   CMP14+EGFR     Genitourinary   Acute on chronic renal failure   Relevant Orders   CMP14+EGFR     Other   Mixed hyperlipidemia   Relevant Orders   Lipid Profile   Other Visit Diagnoses       Diabetes mellitus screening       Relevant Orders   Hemoglobin A1c     Screening-pulmonary TB       Relevant Orders   QuantiFERON-TB Gold Plus       Return in about 4 months (around 12/27/2024) for fasting lab work prior.   Total time spent: 25 minutes. This time includes review of  previous notes and results and patient face to face interaction during today's visit.    Jeoffrey Pollen, NP  08/29/2024   This document may have been prepared by [facility] Voice Recognition software and as such may include unintentional dictation errors.     [1] No Known Allergies [2]  Outpatient Medications Prior  to Visit  Medication Sig   Abatacept  (ORENCIA  CLICKJECT) 125 MG/ML SOAJ Inject 125 mg into the skin every 14 (fourteen) days.   amLODipine  (NORVASC ) 10 MG tablet TAKE 1 TABLET BY MOUTH ONCE DAILY IN THE MORNING   Cholecalciferol (VITAMIN D -3) 125 MCG (5000 UT) TABS Take 1 tablet by mouth daily.   Ferrous Sulfate  (IRON  PO) Take 1 tablet by mouth daily.   folic acid  (FOLVITE ) 1 MG tablet Take 1 tablet (1 mg total) by mouth daily.   losartan  (COZAAR ) 50 MG tablet Take 1 tablet by mouth once daily   mirtazapine  (REMERON ) 7.5 MG tablet Take 1 tablet (7.5 mg total) by mouth at bedtime.   predniSONE  (DELTASONE ) 1 MG tablet Take 3 tablets (3 mg total) by mouth daily with breakfast.   rosuvastatin  (CRESTOR ) 40 MG tablet Take 1 tablet (40 mg total) by mouth daily.   tamsulosin  (FLOMAX ) 0.4 MG CAPS capsule Take 1 capsule (0.4 mg total) by mouth daily after supper.   predniSONE  (DELTASONE ) 5 MG tablet Take 1 tablet by mouth once daily with breakfast (Patient not taking: Reported on 08/29/2024)   No facility-administered medications prior to visit.   "

## 2024-08-30 ENCOUNTER — Other Ambulatory Visit: Payer: Self-pay

## 2024-08-30 ENCOUNTER — Other Ambulatory Visit: Payer: Self-pay | Admitting: Rheumatology

## 2024-08-30 DIAGNOSIS — M0579 Rheumatoid arthritis with rheumatoid factor of multiple sites without organ or systems involvement: Secondary | ICD-10-CM

## 2024-08-30 DIAGNOSIS — Z79899 Other long term (current) drug therapy: Secondary | ICD-10-CM

## 2024-08-30 MED ORDER — ORENCIA CLICKJECT 125 MG/ML ~~LOC~~ SOAJ
125.0000 mg | SUBCUTANEOUS | 2 refills | Status: AC
  Filled 2024-08-31: qty 4, 30d supply, fill #0

## 2024-08-30 NOTE — Telephone Encounter (Signed)
 Last Fill: 11/23/2023  Labs: 07/26/2024 CBC- RBC:3.51,Hemoglobin:9.6, Hematocrit:30.5, MCHC:31.5, Lymphocytes Absolute:512  Renal FunctionPanel-BUN:27, Creatinine:1.54, eGFR CKD-EPI CR:48  TB Gold: 08/06/2023 Negative   Next Visit: 01/10/2025  Last Visit: 08/16/2024  IK:Myzlfjunpi arthritis involving multiple sites with positive rheumatoid factor   Current Dose per office note 08/16/2024: Orencia  125 mg sq injections every 14 days.   Okay to refill Orencia ?

## 2024-08-31 ENCOUNTER — Other Ambulatory Visit: Payer: Self-pay

## 2024-08-31 ENCOUNTER — Ambulatory Visit: Payer: Self-pay | Admitting: Cardiology

## 2024-08-31 LAB — CMP14+EGFR
ALT: 11 IU/L (ref 0–44)
AST: 16 IU/L (ref 0–40)
Albumin: 3.8 g/dL (ref 3.8–4.8)
Alkaline Phosphatase: 111 IU/L (ref 47–123)
BUN/Creatinine Ratio: 17 (ref 10–24)
BUN: 30 mg/dL — ABNORMAL HIGH (ref 8–27)
Bilirubin Total: 0.5 mg/dL (ref 0.0–1.2)
CO2: 18 mmol/L — ABNORMAL LOW (ref 20–29)
Calcium: 9 mg/dL (ref 8.6–10.2)
Chloride: 109 mmol/L — ABNORMAL HIGH (ref 96–106)
Creatinine, Ser: 1.79 mg/dL — ABNORMAL HIGH (ref 0.76–1.27)
Globulin, Total: 3 g/dL (ref 1.5–4.5)
Glucose: 105 mg/dL — ABNORMAL HIGH (ref 70–99)
Potassium: 4.1 mmol/L (ref 3.5–5.2)
Sodium: 140 mmol/L (ref 134–144)
Total Protein: 6.8 g/dL (ref 6.0–8.5)
eGFR: 40 mL/min/1.73 — ABNORMAL LOW

## 2024-08-31 LAB — LIPID PANEL
Chol/HDL Ratio: 2.3 ratio (ref 0.0–5.0)
Cholesterol, Total: 88 mg/dL — ABNORMAL LOW (ref 100–199)
HDL: 39 mg/dL — ABNORMAL LOW
LDL Chol Calc (NIH): 31 mg/dL (ref 0–99)
Triglycerides: 90 mg/dL (ref 0–149)
VLDL Cholesterol Cal: 18 mg/dL (ref 5–40)

## 2024-08-31 LAB — QUANTIFERON-TB GOLD PLUS
QuantiFERON Mitogen Value: >10 [IU]/mL
QuantiFERON Nil Value: 0.07 [IU]/mL
QuantiFERON TB1 Ag Value: 0.13 [IU]/mL
QuantiFERON TB2 Ag Value: 0.09 [IU]/mL
QuantiFERON-TB Gold Plus: NEGATIVE

## 2024-08-31 LAB — HEMOGLOBIN A1C
Est. average glucose Bld gHb Est-mCnc: 120 mg/dL
Hgb A1c MFr Bld: 5.8 % — ABNORMAL HIGH (ref 4.8–5.6)

## 2024-09-04 ENCOUNTER — Other Ambulatory Visit: Payer: Self-pay | Admitting: Physician Assistant

## 2024-09-04 ENCOUNTER — Other Ambulatory Visit: Payer: Self-pay | Admitting: Cardiology

## 2024-09-04 NOTE — Telephone Encounter (Signed)
 Last Fill: 02/16/2024  Next Visit: 01/10/2025  Last Visit: 08/16/2024  Dx: Rheumatoid arthritis involving multiple sites with positive rheumatoid factor   Current Dose per office note on 08/16/2024: dose not mentioned  Okay to refill Folic Acid ?

## 2024-09-04 NOTE — Telephone Encounter (Signed)
 Please clarify if the patient is taking folic acid ?

## 2024-09-06 ENCOUNTER — Other Ambulatory Visit: Payer: Self-pay | Admitting: *Deleted

## 2024-09-06 NOTE — Telephone Encounter (Signed)
 LMOM for patient to call the office to confirm

## 2024-09-11 ENCOUNTER — Other Ambulatory Visit: Payer: Self-pay | Admitting: Radiation Oncology

## 2024-09-15 ENCOUNTER — Other Ambulatory Visit: Payer: Self-pay | Admitting: Rheumatology

## 2024-09-15 NOTE — Telephone Encounter (Signed)
 Last Fill: 08/04/2024  Next Visit: 01/10/2025  Last Visit: 08/16/2024  Dx: Rheumatoid arthritis involving multiple sites with positive rheumatoid factor   Current Dose per office note on 08/16/2024: He has not noticed any new or worsening symptoms since reducing the dose of prednisone  from 4 mg to 3 mg daily as of 08/04/2024.  Plan to try to continue prednisone  1 mg every 2 months.    Attempted to contact patient and left message to advise patient to call the office to clarify dose.

## 2024-09-15 NOTE — Telephone Encounter (Signed)
 Contacted the patient and patient states he is currently taking 3 mg daily but will try going down to 2 mg daily. Will change the prescription to reflect 2 mg daily for two months. Please review and sign.

## 2024-09-21 ENCOUNTER — Inpatient Hospital Stay

## 2024-09-21 ENCOUNTER — Inpatient Hospital Stay: Admitting: Oncology

## 2024-10-11 ENCOUNTER — Other Ambulatory Visit

## 2024-10-18 ENCOUNTER — Ambulatory Visit: Admitting: Radiation Oncology

## 2024-12-28 ENCOUNTER — Ambulatory Visit: Admitting: Internal Medicine

## 2025-01-10 ENCOUNTER — Ambulatory Visit: Admitting: Physician Assistant

## 2025-01-24 ENCOUNTER — Other Ambulatory Visit

## 2025-07-26 ENCOUNTER — Ambulatory Visit: Admitting: Urology
